# Patient Record
Sex: Male | Born: 1935 | Race: White | Hispanic: No | Marital: Married | Smoking: Current every day smoker
Health system: Southern US, Community
[De-identification: ages and names within clinical notes are randomized; demographics above are authoritative.]

## PROBLEM LIST (undated history)

## (undated) DIAGNOSIS — Z9861 Coronary angioplasty status: Principal | ICD-10-CM

## (undated) DIAGNOSIS — I10 Essential (primary) hypertension: Secondary | ICD-10-CM

## (undated) DIAGNOSIS — E785 Hyperlipidemia, unspecified: Secondary | ICD-10-CM

## (undated) DIAGNOSIS — I2510 Atherosclerotic heart disease of native coronary artery without angina pectoris: Secondary | ICD-10-CM

## (undated) DIAGNOSIS — I214 Non-ST elevation (NSTEMI) myocardial infarction: Secondary | ICD-10-CM

## (undated) DIAGNOSIS — E119 Type 2 diabetes mellitus without complications: Secondary | ICD-10-CM

## (undated) DIAGNOSIS — J189 Pneumonia, unspecified organism: Secondary | ICD-10-CM

## (undated) DIAGNOSIS — N400 Enlarged prostate without lower urinary tract symptoms: Secondary | ICD-10-CM

## (undated) DIAGNOSIS — M1990 Unspecified osteoarthritis, unspecified site: Secondary | ICD-10-CM

## (undated) HISTORY — DX: Non-ST elevation (NSTEMI) myocardial infarction: I21.4

## (undated) HISTORY — DX: Essential (primary) hypertension: I10

## (undated) HISTORY — DX: Type 2 diabetes mellitus without complications: E11.9

## (undated) HISTORY — PX: DENTAL SURGERY: SHX609

## (undated) HISTORY — DX: Coronary angioplasty status: Z98.61

## (undated) HISTORY — DX: Atherosclerotic heart disease of native coronary artery without angina pectoris: I25.10

## (undated) HISTORY — PX: CATARACT EXTRACTION W/ INTRAOCULAR LENS  IMPLANT, BILATERAL: SHX1307

## (undated) HISTORY — DX: Hyperlipidemia, unspecified: E78.5

## (undated) HISTORY — PX: VASECTOMY: SHX75

## (undated) HISTORY — PX: NASAL FRACTURE SURGERY: SHX718

## (undated) HISTORY — DX: Benign prostatic hyperplasia without lower urinary tract symptoms: N40.0

---

## 1998-09-27 ENCOUNTER — Encounter: Payer: Self-pay | Admitting: Internal Medicine

## 1998-09-27 ENCOUNTER — Ambulatory Visit (HOSPITAL_COMMUNITY): Admission: RE | Admit: 1998-09-27 | Discharge: 1998-09-27 | Payer: Self-pay | Admitting: Internal Medicine

## 1999-10-29 ENCOUNTER — Ambulatory Visit (HOSPITAL_COMMUNITY): Admission: RE | Admit: 1999-10-29 | Discharge: 1999-10-29 | Payer: Self-pay | Admitting: Urology

## 2001-01-29 ENCOUNTER — Emergency Department (HOSPITAL_COMMUNITY): Admission: EM | Admit: 2001-01-29 | Discharge: 2001-01-30 | Payer: Self-pay | Admitting: Emergency Medicine

## 2001-11-10 ENCOUNTER — Ambulatory Visit (HOSPITAL_COMMUNITY): Admission: RE | Admit: 2001-11-10 | Discharge: 2001-11-10 | Payer: Self-pay | Admitting: Internal Medicine

## 2001-11-10 ENCOUNTER — Encounter: Payer: Self-pay | Admitting: Internal Medicine

## 2003-02-14 ENCOUNTER — Ambulatory Visit (HOSPITAL_COMMUNITY): Admission: RE | Admit: 2003-02-14 | Discharge: 2003-02-14 | Payer: Self-pay | Admitting: Cardiology

## 2003-02-14 ENCOUNTER — Encounter: Payer: Self-pay | Admitting: Cardiology

## 2003-03-28 ENCOUNTER — Ambulatory Visit (HOSPITAL_COMMUNITY): Admission: RE | Admit: 2003-03-28 | Discharge: 2003-03-28 | Payer: Self-pay | Admitting: Gastroenterology

## 2003-03-28 ENCOUNTER — Encounter (INDEPENDENT_AMBULATORY_CARE_PROVIDER_SITE_OTHER): Payer: Self-pay | Admitting: Specialist

## 2004-01-09 ENCOUNTER — Ambulatory Visit (HOSPITAL_COMMUNITY): Admission: RE | Admit: 2004-01-09 | Discharge: 2004-01-09 | Payer: Self-pay | Admitting: Internal Medicine

## 2006-01-15 ENCOUNTER — Ambulatory Visit (HOSPITAL_COMMUNITY): Admission: RE | Admit: 2006-01-15 | Discharge: 2006-01-15 | Payer: Self-pay | Admitting: Internal Medicine

## 2007-05-12 ENCOUNTER — Ambulatory Visit (HOSPITAL_COMMUNITY): Admission: RE | Admit: 2007-05-12 | Discharge: 2007-05-12 | Payer: Self-pay | Admitting: Gastroenterology

## 2008-07-26 ENCOUNTER — Ambulatory Visit (HOSPITAL_COMMUNITY): Admission: RE | Admit: 2008-07-26 | Discharge: 2008-07-26 | Payer: Self-pay | Admitting: Internal Medicine

## 2009-05-03 ENCOUNTER — Ambulatory Visit (HOSPITAL_COMMUNITY): Admission: RE | Admit: 2009-05-03 | Discharge: 2009-05-03 | Payer: Self-pay | Admitting: Internal Medicine

## 2009-07-11 ENCOUNTER — Encounter: Admission: RE | Admit: 2009-07-11 | Discharge: 2009-07-11 | Payer: Self-pay | Admitting: Orthopaedic Surgery

## 2009-12-19 DIAGNOSIS — I214 Non-ST elevation (NSTEMI) myocardial infarction: Secondary | ICD-10-CM

## 2009-12-19 HISTORY — DX: Non-ST elevation (NSTEMI) myocardial infarction: I21.4

## 2009-12-19 HISTORY — PX: CORONARY ANGIOPLASTY: SHX604

## 2009-12-19 HISTORY — PX: CARDIAC CATHETERIZATION: SHX172

## 2009-12-19 HISTORY — PX: TRANSTHORACIC ECHOCARDIOGRAM: SHX275

## 2009-12-30 ENCOUNTER — Inpatient Hospital Stay (HOSPITAL_COMMUNITY): Admission: EM | Admit: 2009-12-30 | Discharge: 2010-01-02 | Payer: Self-pay | Admitting: Emergency Medicine

## 2009-12-31 DIAGNOSIS — I2510 Atherosclerotic heart disease of native coronary artery without angina pectoris: Secondary | ICD-10-CM | POA: Insufficient documentation

## 2010-01-01 ENCOUNTER — Encounter (INDEPENDENT_AMBULATORY_CARE_PROVIDER_SITE_OTHER): Payer: Self-pay | Admitting: Internal Medicine

## 2010-05-15 ENCOUNTER — Encounter: Admission: RE | Admit: 2010-05-15 | Discharge: 2010-05-15 | Payer: Self-pay | Admitting: Internal Medicine

## 2010-08-21 HISTORY — PX: CARDIAC CATHETERIZATION: SHX172

## 2010-09-10 ENCOUNTER — Observation Stay (HOSPITAL_COMMUNITY)
Admission: EM | Admit: 2010-09-10 | Discharge: 2010-09-11 | DRG: 287 | Disposition: A | Discharge status: Home / Self Care | Payer: Medicare Other | Attending: Cardiology | Admitting: Cardiology

## 2010-09-10 ENCOUNTER — Emergency Department (HOSPITAL_COMMUNITY): Payer: Medicare Other

## 2010-09-10 DIAGNOSIS — F17200 Nicotine dependence, unspecified, uncomplicated: Secondary | ICD-10-CM | POA: Diagnosis present

## 2010-09-10 DIAGNOSIS — Z7902 Long term (current) use of antithrombotics/antiplatelets: Secondary | ICD-10-CM

## 2010-09-10 DIAGNOSIS — Z7982 Long term (current) use of aspirin: Secondary | ICD-10-CM

## 2010-09-10 DIAGNOSIS — E119 Type 2 diabetes mellitus without complications: Secondary | ICD-10-CM | POA: Diagnosis present

## 2010-09-10 DIAGNOSIS — I2510 Atherosclerotic heart disease of native coronary artery without angina pectoris: Principal | ICD-10-CM | POA: Diagnosis present

## 2010-09-10 DIAGNOSIS — Z9861 Coronary angioplasty status: Secondary | ICD-10-CM

## 2010-09-10 DIAGNOSIS — E785 Hyperlipidemia, unspecified: Secondary | ICD-10-CM | POA: Diagnosis present

## 2010-09-10 DIAGNOSIS — N400 Enlarged prostate without lower urinary tract symptoms: Secondary | ICD-10-CM | POA: Diagnosis present

## 2010-09-10 DIAGNOSIS — I200 Unstable angina: Secondary | ICD-10-CM | POA: Diagnosis present

## 2010-09-10 DIAGNOSIS — M1990 Unspecified osteoarthritis, unspecified site: Secondary | ICD-10-CM | POA: Diagnosis present

## 2010-09-10 DIAGNOSIS — I252 Old myocardial infarction: Secondary | ICD-10-CM

## 2010-09-10 LAB — CBC
HCT: 45.5 % (ref 39.0–52.0)
Hemoglobin: 15.6 g/dL (ref 13.0–17.0)
MCH: 32.4 pg (ref 26.0–34.0)
MCHC: 34.3 g/dL (ref 30.0–36.0)
RBC: 4.81 MIL/uL (ref 4.22–5.81)
WBC: 11.2 10*3/uL — ABNORMAL HIGH (ref 4.0–10.5)

## 2010-09-10 LAB — DIFFERENTIAL
Eosinophils Absolute: 0.4 10*3/uL (ref 0.0–0.7)
Lymphs Abs: 1.8 10*3/uL (ref 0.7–4.0)
Monocytes Absolute: 1.5 10*3/uL — ABNORMAL HIGH (ref 0.1–1.0)
Neutrophils Relative %: 67 % (ref 43–77)

## 2010-09-10 LAB — GLUCOSE, CAPILLARY: Glucose-Capillary: 195 mg/dL — ABNORMAL HIGH (ref 70–99)

## 2010-09-10 LAB — COMPREHENSIVE METABOLIC PANEL
ALT: 24 U/L (ref 0–53)
CO2: 26 mEq/L (ref 19–32)
Creatinine, Ser: 0.98 mg/dL (ref 0.4–1.5)
GFR calc Af Amer: >60 mL/min (ref >60)
Glucose, Bld: 117 mg/dL — ABNORMAL HIGH (ref 70–99)
Potassium: 3.9 mEq/L (ref 3.5–5.1)
Total Bilirubin: 0.4 mg/dL (ref 0.3–1.2)
Total Protein: 6.7 g/dL (ref 6.0–8.3)

## 2010-09-10 LAB — TROPONIN I: Troponin I: 0.04 ng/mL (ref 0.00–0.06)

## 2010-09-10 LAB — CK TOTAL AND CKMB (NOT AT ARMC)
CK, MB: 2.4 ng/mL (ref 0.3–4.0)
Relative Index: 1.6 (ref 0.0–2.5)

## 2010-09-10 LAB — POCT CARDIAC MARKERS: Myoglobin, poc: 59.2 ng/mL (ref 12–200)

## 2010-09-11 LAB — CBC
HCT: 39.8 % (ref 39.0–52.0)
Hemoglobin: 13.2 g/dL (ref 13.0–17.0)
MCHC: 33.2 g/dL (ref 30.0–36.0)
MCV: 94.3 fL (ref 78.0–100.0)
Platelets: 182 10*3/uL (ref 150–400)
RBC: 4.22 MIL/uL (ref 4.22–5.81)

## 2010-09-11 LAB — CARDIAC PANEL(CRET KIN+CKTOT+MB+TROPI)
Relative Index: INVALID (ref 0.0–2.5)
Troponin I: 0.03 ng/mL (ref 0.00–0.06)

## 2010-09-11 LAB — HEPARIN LEVEL (UNFRACTIONATED): Heparin Unfractionated: 0.11 IU/mL — ABNORMAL LOW (ref 0.30–0.70)

## 2010-09-11 LAB — GLUCOSE, CAPILLARY: Glucose-Capillary: 119 mg/dL — ABNORMAL HIGH (ref 70–99)

## 2010-09-12 LAB — POCT ACTIVATED CLOTTING TIME: Activated Clotting Time: 452 seconds

## 2010-09-17 NOTE — Procedures (Signed)
NAME:  David [name], David [name] NO.:  [id number]  MEDICAL RECORD NO.:  [id number]           PATIENT TYPE:  I  LOCATION:  6525                         FACILITY:  [facility]  PHYSICIAN:  Landry Corporal, MD DATE OF BIRTH:  [birth year]  DATE OF PROCEDURE: DATE OF DISCHARGE:  09/11/2010                           CARDIAC CATHETERIZATION   PRIMARY CARDIOLOGIST:  Gaspar Garbe B. Little, MD  PRIMARY PHYSICIAN:  Lucky Cowboy, MD.  PERFORMING PHYSICIAN:  Landry Corporal, MD  PROCEDURES PERFORMED: 1. Left heart catheterization via first the 5-French upsized to 6-     French sheath in the right femoral artery. 2. Selective coronary angiography. 3. Left ventriculography in the RAO projection with 10 mL of contrast     per second for 30 seconds. 4. Fractional flow reserve measurement of the proximal and mid LAD.  INDICATIONS: 1. Chest pain concerning for unstable angina. 2. Known coronary artery disease, status post PCI to the LAD in June     2011.  BRIEF HISTORY:  David [name] is a pleasant 75 year old gentleman, followed by Dr. Clarene Duke and Dr. Oneta Rack with a history of known coronary artery disease with non-ST-elevation MI in June 2011, where he underwent PTCA of the proximal LAD and drug-eluting stenting of the distal LAD with a Promus 2.5 x 15-mm stent.  His ejection fraction was about 45% at catheterization at that time.  He had medication adjustments in November, but then presented on February 21 with onset of waking up with chest pain at 2 o'clock in the morning.  It was left sided, throbbing. It was similar to his prior MI, but just not as severe.  Based on his history and risk factors of hypertension, hyperlipidemia, and known coronary artery disease, the decision was made to take the patient to cardiac catheterization lab for diagnostic catheterization plus/minus intervention.  The risks, benefits, alternatives, and indications of the procedure were explained to the  patient in detail.  The patient voiced understanding and informed consent was obtained with the signed form placed on the chart.  DESCRIPTION OF PROCEDURE:  The patient was brought to second floor [facility] in a fasting state.  After a modified Allen test was performed in the right radial artery that demonstrated abnormal flow, the patient was then prepped and draped in the usual sterile fashion with the right groin prepped.  A time-out period was performed and the patient was sedated with intravenous Versed and fentanyl.  The femoral head was localized using tactile and fluoroscopic guidance.  Then, the right groin was anesthetized using 1% subcutaneous lidocaine.  The right common femoral artery was then accessed using the modified Seldinger technique with a placement of 5-French sheath. First, a 5-French JL-4 followed by 5-French JR-4 catheter were advanced over wire.  Multiple angiographic views of first left then right coronary systems were obtained.  The JR-4 catheter was exchanged over wire for a 5-French pigtail catheter, which was advanced across the aortic valve measuring left ventricular pressure and pullback gradient. With the catheter still in place in the ventricle in the RAO projection, a left ventriculogram was performed with 10 mL  of contrast per second for 30 seconds.  After completion of left ventriculogram, the catheter was pulled back across the aortic valve for measuring pullback gradient.  PRELIMINARY ANGIOGRAPHIC FINDINGS: 1. Left main is a large-caliber vessel, bifurcates in circumflex     arteries.  There was no significant coronary artery disease, just     minimal luminal irregularities. 2. LAD starts off as a relative large-caliber vessel, and then after     the takeoff of the first diagonal branch, it does have a stepdown     to about 50% lesion, and then stays along tubular 40-50% lesion,     terminating in about 60-70% lesion  at the takeoff of the next     second small diagonal branch.  Just following that branch, there is     a widely patent Promus drug-eluting stent and the vessel remained     in this caliber of the stent throughout the remainder of the     vessel.  The diagonal branch itself had significant arteries, but     had no significant disease. 3. Left circumflex artery is a large-caliber vessel, which bifurcates     into a large branching obtuse marginal with no significant disease     in the atrioventricular groove.  Circumflex itself also goes down     to provide small posterolateral branches, but there is no     significant disease in these vessels. 4. The RCA is a large-caliber dominant vessel that did have a small to     moderate-caliber right ventricular marginal branch that has a 90%     lesion in the ostial portion.  There is a large sleeping RV     marginal branch that goes down around to the apex.  There is no     significant disease.  The remainder of the RCA continues on to give     rise to a posterior descending artery and a very small     posterolateral branch system.  There is no significant disease in     these vessels. 5. The left ventriculogram showed ejection fraction of 50-55% with no     significant wall motion abnormalities.  There was no pullback     gradient across the aortic valve.  HEMODYNAMIC RESULTS:  Aortic pressures 102/54 mmHg with a mean of 72 mmHg.  Left ventricular pressure is 99/8 mmHg with an EDP of 50 mmHg.  FRACTIONAL FLOW RESERVE PROCEDURE:  After angiographic views of the LAD were performed, the intermediate lesions were concerning to be the potential culprit lesions as this was area that had been intervened on before.  The 5-French sheath was exchanged over wire for a 6-French sheath, and a 6-French XBLAD 3.5 catheter was advanced over wire and used to engage the left coronary artery.  Prior to advancing the sheath, an Angiomax bolus and drip was  initiated with confirmation of an ACT greater than 200 seconds.  Volcano FFR wire was then zeroed and then advanced to the tip of the catheter for normalization.  The wire was then advanced down across the lesion beyond the stent in the LAD.  It was confirmed fluoroscopically, and intravenous adenosine was infused at the standard rate for 2 minutes.  The baseline FFR prior to adenosine infusion was 0.93.  After 2 minutes of infusion, the average was 0.81- 0.82.  This was not felt to be angiographically significant.  The wire was then removed completely out of the body and SCOUT angiography confirmed  no perforation or dissection in the LAD with TIMI 3 flow. Catheter was then removed completely over wire without any complications, and a sheath was sewn in place to be pulled back in the patient's room after the standard 2-hour wait for Angiomax.  The Angiomax drip was discontinued at this time.  The patient was stable before, during, and after the procedure.  CATHETERIZATION STATISTICS: 1. Sedation.  IV Versed 1 mg and IV fentanyl 25 mcg. 2. The patient was given 250 mL bolus of normal saline. 3. Angiomax bolus and drip was administered according to weight-based     protocol. 4. Intravenous adenosine was administered for 2 minutes.  IMPRESSION: 1. Culprit lesion was likely the 90% ostial lesion in the right     ventricular marginal branch.  This was not a lesion or vessel that     is usually amenable to percutaneous intervention, and therefore it     is best for medical management. 2. Diffuse moderate, but nonobstructive coronary artery disease and     wide patent stent in the left anterior descending.  The     conglomerate fractional flow reserve in the proximal and mid     portion of the LAD is 0.81, which is not hemodynamically     significant. 3. Low normal ejection fraction of 50-55% with relatively normal left     ventricular end-diastolic pressure.  PLAN: 1. The patient will  return to his room on 6500 for standard     postcatheterization care and sheath pull.  He after standard     bedrest if stable, will likely be discharged today. 2. We will plan on optimizing his medical management.  He is somewhat     bradycardic and therefore it would be unlikely to go up on his beta-     blocker, and would likely initiate a long-acting nitroglycerin     versus amlodipine depending how is blood pressure is upon arrival     to the floor. 3. Smoking cessation counseling as the patient continues to smoke     cigar. 4. He continued to say he is intolerant of statins and would not take     statin, so for his dyslipidemia, we will have to do diet and     exercise counseling and consider fibrate.  I discussed the case with the patient and with Dr. Clarene Duke who is in agreement with the plan.          ______________________________ Landry Corporal, MD     DWH/MEDQ  D:  09/11/2010  T:  09/12/2010  Job:  [id number]  cc:   Thereasa Solo. Little, M.D. [facility] Lucky Cowboy, M.D.  Electronically Signed by Bryan Lemma MD on 09/17/2010 09:48:22 AM

## 2010-09-19 NOTE — Discharge Summary (Signed)
NAME:  David [name], David [name] NO.:  [id number]  MEDICAL RECORD NO.:  [id number]           PATIENT TYPE:  I  LOCATION:  6525                         FACILITY:  [facility]  PHYSICIAN:  Thereasa Solo. Little, M.D. DATE OF BIRTH:  Oct 19, 1935  DATE OF ADMISSION:  09/10/2010 DATE OF DISCHARGE:                              DISCHARGE SUMMARY   DISCHARGE DIAGNOSES: 1. Chest pain consistent with unstable angina. 2. Catheterization this admission showing moderate coronary disease to     be treated medically with no in-stent restenosis of the previously     placed left anterior descending stent, no restenosis of previous     left anterior descending angioplasty site. 3. Known coronary disease with non-ST-elevation myocardial infarction,     treated with left anterior descending, proximal percutaneous     transluminal coronary angioplasty, and distal left anterior     descending drug-eluting stent placement, June 2011. 4. Type 2 non-insulin-dependent diabetes. 5. Dyslipidemia with statin intolerance. 6. History of benign prostatic hypertrophy, the patient was intolerant     to Flomax and terazosin. 7. Degenerative joint disease.  HOSPITAL COURSE:  The patient is a pleasant 75 year old male followed by Dr. Clarene Duke and Dr. Oneta Rack.  He has coronary disease, he had a non-ST- elevation MI in June 2011.  He underwent catheterization in the proximal LAD and had an angioplasty in the distal LAD and drug-eluting stent was placed.  He saw Dr. Clarene Duke in November and Dr. Clarene Duke made some adjustments in his medications.  He has had problems with dizziness and we felt some of this was orthostatic in nature.  The patient presented to the emergency room on September 10, 2010 complaining of chest pain which is left-sided and described as "throbbing."  Although somewhat atypical, the patient says this was similar to his pre-MI symptoms.  The patient was seen by Dr. Tresa Endo and myself and admitted to  telemetry and started on heparin and set up for diagnostic catheterization.  This was done by Dr. Herbie Baltimore on September 11, 2010.  The proximal LAD angioplasty site was 50-60% narrowed, the Promus mid LAD stent was patent, although there was a 60-70% stenosis just proximal to the stent.  Left main circumflex and OM were patent.  RCA was patent but he did have a 99% RV branch.  Plan is for medical therapy.  We added nitrate.  We tried increasing his beta-blocker and ACE inhibitor on admission but his blood pressure and heart rate would not tolerate this, so we cut back to his home dose at discharge.  The patient also says that he is not taking his Flomax because it made him dizzy, he is also not taking terazosin because he had some type of side effect from that.  He will follow up with Dr. Clarene Duke in a couple of weeks.  He has been encouraged to quit smoking cigars.  LABORATORY DATA:  White count 8.8, hemoglobin 13.2, hematocrit 39.8, and platelets 182.  CK-MB and troponins were negative.  Sodium 137, potassium 3.9, BUN 13, and creatinine 0.98.  LFTs were normal.  INR 0.89.  EKG shows sinus rhythm and nonspecific ST  changes.  Chest x-ray shows no acute findings.  DISPOSITION:  The patient is discharged late on September 11, 2010.  He will follow up with Dr. Clarene Duke as an outpatient in a week or two.     Abelino Derrick, P.A.   ______________________________ Thereasa Solo. Little, M.D.    Lenard Lance  D:  09/11/2010  T:  09/12/2010  Job:  [id number]  cc:   Lucky Cowboy, M.D.  Electronically Signed by Corine Shelter P.A. on 09/13/2010 09:10:03 AM Electronically Signed by Julieanne Manson M.D. on 09/19/2010 07:58:43 AM

## 2010-09-23 NOTE — H&P (Signed)
NAME:  David [name], David [name] NO.:  [id number]  MEDICAL RECORD NO.:  [id number]           PATIENT TYPE:  I  LOCATION:  6525                         FACILITY:  [facility]  PHYSICIAN:  Nicki Guadalajara, M.D.     DATE OF BIRTH:  [birth year]  DATE OF ADMISSION:  09/10/2010 DATE OF DISCHARGE:                             HISTORY & PHYSICAL   CHIEF COMPLAINT:  Chest pain.  HISTORY OF PRESENT ILLNESS:  Mr. David [name] is a 75 year old male, followed by Dr. Clarene Duke and Dr. Oneta Rack with a history of coronary disease.  He had a non-ST elevation MI in June 2011.  His troponin peaked then at 8.26.  He underwent catheterization and angioplasty to the proximal LAD and stenting to the distal LAD.  His EF was 45% at cath.  He has done pretty well since, Dr. Clarene Duke saw him in November and made some adjustments in his medications.  The patient presented to the emergency room on September 10, 2010, complaining of chest pain which actually started about 2:00 a.m.  He describes as his pain is left-sided throbbing chest pain off and on during the day.  There is no obvious aggravating or alleviating factors, although sometimes he thinks it is worse when he lays on his left side.  He says this pain is similar to his pre MI pain, although not as intense.  He denies any associated nausea, vomiting, diaphoresis or radiation of his arms or jaw.  He did not take nitroglycerin, he says he does not have this at home.  He is admitted by Dr. Tresa Endo and myself for further evaluation.  PAST MEDICAL HISTORY:  Remarkable for type 2 non-insulin-dependent diabetes.  He has symptoms of BPH, and Dr. Clarene Duke recently added Flomax. He has dyslipidemia, but is intolerant to LIPITOR and CRESTOR and says that he will not take a statin ever again.  He has had a remote polypectomy in 2004.  He does have a cyst in his back that causes some pain in his leg, he will probably need surgery for this once he can come off Plavix safely.   He did receive a drug-eluting stent in June.  He also has arthritis and DJD in his knees.  HOME MEDICATIONS: 1. Vitamin D. 2. Terazosin 5 mg a day. 3. Aspirin 81 mg a day. 4. Lisinopril 5 mg a day. 5. Metformin 500 mg b.i.d. 6. Metoprolol 25 mg half tablet twice a day. 7. Plavix 75 mg a day.  ALLERGIES:  He is allergic to TETANUS TOXOID, he is intolerant to STATINS.  SOCIAL HISTORY:  He is retired from [employer].  He smokes cigars.  He has 1 or 2 glasses of vodka on a daily basis.  FAMILY HISTORY:  Unremarkable for early coronary disease.  REVIEW OF SYSTEMS:  Essentially unremarkable except for noted above.  PHYSICAL EXAMINATION:  VITAL SIGNS:  Blood pressure is 124/90 in the emergency room with a pulse of 62, respirations 16. GENERAL:  He is a well-developed, well-nourished male in no acute distress. HEENT:  Normocephalic, atraumatic.  Extraocular movements are intact. Sclerae are nonicteric.  Lids and conjunctivae within normal limits.  NECK:  Without JVD or bruit. CHEST:  Clear to auscultation and percussion. CARDIAC:  Regular rate and rhythm without obvious murmur, rub, or gallop.  Normal S1 and S2. ABDOMEN:  Nontender.  No hepatosplenomegaly. EXTREMITIES:  Without edema.  Distal pulses intact. NEURO:  Grossly intact.  He is awake, alert, oriented, and cooperative. Moves all extremities without obvious deficit. SKIN:  Cool and dry.  Initial troponin is negative.  EKG shows sinus rhythm without acute changes.  IMPRESSION: 1. Unstable angina. 2. Known coronary disease with non-ST elevation myocardial infarction     treated with a proximal left anterior descending, percutaneous     transluminal coronary angioplasty and distal left anterior     descending, drug-eluting stent in June 2011. 3. Mild left ventricular dysfunction with an ejection fraction of 45%     at cath. 4. Type 2 non-insulin-dependent diabetes. 5. Dyslipidemia with statin intolerance. 6.  History of benign prostatic hypertrophy. 7. History of polypectomy in 2004. 8. Degenerative joint disease.  PLAN:  The patient was seen by Dr. Tresa Endo and myself in the emergency room.  We will go ahead and admit him as unstable angina.  We started nitroglycerin paste and we will start heparin.  He will need diagnostic catheterization.     Abelino Derrick, P.A.   ______________________________ Nicki Guadalajara, M.D.    Lenard Lance  D:  09/11/2010  T:  09/11/2010  Job:  [id number]  Electronically Signed by Corine Shelter P.A. on 09/11/2010 12:26:01 PM Electronically Signed by Nicki Guadalajara M.D. on 09/23/2010 05:17:15 PM

## 2010-10-06 LAB — GLUCOSE, CAPILLARY

## 2010-10-06 LAB — BASIC METABOLIC PANEL
BUN: 14 mg/dL (ref 6–23)
Calcium: 9.1 mg/dL (ref 8.4–10.5)
Creatinine, Ser: 1.06 mg/dL (ref 0.4–1.5)
GFR calc Af Amer: >60 mL/min (ref >60)
GFR calc non Af Amer: >60 mL/min (ref >60)
Potassium: 4.3 mEq/L (ref 3.5–5.1)

## 2010-10-06 LAB — CBC
HCT: 43.1 % (ref 39.0–52.0)
MCV: 95 fL (ref 78.0–100.0)
RBC: 4.53 MIL/uL (ref 4.22–5.81)
WBC: 10.5 10*3/uL (ref 4.0–10.5)

## 2010-10-07 LAB — CBC
HCT: 39.9 % (ref 39.0–52.0)
HCT: 42.5 % (ref 39.0–52.0)
HCT: 42.5 % (ref 39.0–52.0)
Hemoglobin: 14.3 g/dL (ref 13.0–17.0)
Hemoglobin: 14.6 g/dL (ref 13.0–17.0)
MCHC: 33.7 g/dL (ref 30.0–36.0)
MCV: 94.3 fL (ref 78.0–100.0)
MCV: 94.5 fL (ref 78.0–100.0)
Platelets: 160 10*3/uL (ref 150–400)
RBC: 4.24 MIL/uL (ref 4.22–5.81)
RBC: 4.5 MIL/uL (ref 4.22–5.81)
RBC: 4.51 MIL/uL (ref 4.22–5.81)
RDW: 13.1 % (ref 11.5–15.5)
WBC: 10.6 10*3/uL — ABNORMAL HIGH (ref 4.0–10.5)
WBC: 9.8 10*3/uL (ref 4.0–10.5)

## 2010-10-07 LAB — GLUCOSE, CAPILLARY
Glucose-Capillary: 116 mg/dL — ABNORMAL HIGH (ref 70–99)
Glucose-Capillary: 118 mg/dL — ABNORMAL HIGH (ref 70–99)
Glucose-Capillary: 119 mg/dL — ABNORMAL HIGH (ref 70–99)
Glucose-Capillary: 131 mg/dL — ABNORMAL HIGH (ref 70–99)
Glucose-Capillary: 133 mg/dL — ABNORMAL HIGH (ref 70–99)
Glucose-Capillary: 138 mg/dL — ABNORMAL HIGH (ref 70–99)

## 2010-10-07 LAB — CK TOTAL AND CKMB (NOT AT ARMC)
CK, MB: 4.6 ng/mL — ABNORMAL HIGH (ref 0.3–4.0)
Total CK: 104 U/L (ref 7–232)

## 2010-10-07 LAB — LIPID PANEL
Cholesterol: 140 mg/dL (ref 0–200)
HDL: 41 mg/dL (ref >39)
Total CHOL/HDL Ratio: 3.4 RATIO
VLDL: 44 mg/dL — ABNORMAL HIGH (ref 0–40)

## 2010-10-07 LAB — COMPREHENSIVE METABOLIC PANEL
AST: 33 U/L (ref 0–37)
Albumin: 3.4 g/dL — ABNORMAL LOW (ref 3.5–5.2)
Alkaline Phosphatase: 51 U/L (ref 39–117)
BUN: 19 mg/dL (ref 6–23)
CO2: 29 mEq/L (ref 19–32)
Chloride: 107 mEq/L (ref 96–112)
GFR calc non Af Amer: >60 mL/min (ref >60)
Potassium: 3.9 mEq/L (ref 3.5–5.1)
Total Bilirubin: 0.7 mg/dL (ref 0.3–1.2)

## 2010-10-07 LAB — POCT I-STAT, CHEM 8
BUN: 22 mg/dL (ref 6–23)
Creatinine, Ser: 1.3 mg/dL (ref 0.4–1.5)
Glucose, Bld: 132 mg/dL — ABNORMAL HIGH (ref 70–99)
Hemoglobin: 14.6 g/dL (ref 13.0–17.0)
Potassium: 3.7 mEq/L (ref 3.5–5.1)
Sodium: 142 mEq/L (ref 135–145)

## 2010-10-07 LAB — PROTIME-INR: Prothrombin Time: 13.1 seconds (ref 11.6–15.2)

## 2010-10-07 LAB — BASIC METABOLIC PANEL
CO2: 29 mEq/L (ref 19–32)
Chloride: 106 mEq/L (ref 96–112)
GFR calc Af Amer: >60 mL/min (ref >60)
Glucose, Bld: 105 mg/dL — ABNORMAL HIGH (ref 70–99)
Potassium: 3.8 mEq/L (ref 3.5–5.1)
Sodium: 140 mEq/L (ref 135–145)

## 2010-10-07 LAB — POCT CARDIAC MARKERS
CKMB, poc: <1 ng/mL — ABNORMAL LOW (ref 1.0–8.0)
Myoglobin, poc: 65.7 ng/mL (ref 12–200)
Troponin i, poc: <0.05 ng/mL (ref 0.00–0.09)

## 2010-10-07 LAB — DIFFERENTIAL
Basophils Absolute: 0 10*3/uL (ref 0.0–0.1)
Basophils Relative: 0 % (ref 0–1)
Monocytes Relative: 10 % (ref 3–12)
Neutro Abs: 6.6 10*3/uL (ref 1.7–7.7)
Neutrophils Relative %: 68 % (ref 43–77)

## 2010-10-07 LAB — LIPASE, BLOOD: Lipase: 23 U/L (ref 11–59)

## 2010-10-07 LAB — HEPATIC FUNCTION PANEL
ALT: 22 U/L (ref 0–53)
Alkaline Phosphatase: 59 U/L (ref 39–117)
Bilirubin, Direct: <0.1 mg/dL (ref 0.0–0.3)
Total Bilirubin: 0.4 mg/dL (ref 0.3–1.2)

## 2010-10-07 LAB — MRSA PCR SCREENING: MRSA by PCR: NEGATIVE

## 2010-10-07 LAB — CARDIAC PANEL(CRET KIN+CKTOT+MB+TROPI)
CK, MB: 71.6 ng/mL (ref 0.3–4.0)
Relative Index: 16.4 — ABNORMAL HIGH (ref 0.0–2.5)
Total CK: 154 U/L (ref 7–232)
Total CK: 446 U/L — ABNORMAL HIGH (ref 7–232)
Troponin I: 2.86 ng/mL (ref 0.00–0.06)

## 2010-10-07 LAB — BRAIN NATRIURETIC PEPTIDE: Pro B Natriuretic peptide (BNP): 48 pg/mL (ref 0.0–100.0)

## 2010-12-03 NOTE — Op Note (Signed)
NAME:  David [name], ERICKSEN NO.:  [id number]   MEDICAL RECORD NO.:  [id number]          PATIENT TYPE:  AMB   LOCATION:  ENDO                         FACILITY:  [facility]   PHYSICIAN:  James L. Malon Kindle., M.D.DATE OF BIRTH:  [birth year]   DATE OF PROCEDURE:  05/12/2007  DATE OF DISCHARGE:                               OPERATIVE REPORT   PROCEDURE:  Colonoscopy.   ENDOSCOPIST:  Llana Aliment. Edwards, M.D.   MEDICATIONS:  Fentanyl 75 mcg, Versed 7 mg IV.   INDICATION:  The patient had colonoscopy in September with findings of a  1/5-cm flat sessile polyp; it was shaved down in pieces.  The path  revealed this to be benign.  It was not clear that all this was  adequately removed and it was felt that another colonoscopy after a  period of healing with an argon plasma coagulator on standby was  indicated.   DESCRIPTION OF PROCEDURE:  Procedure had been explained to the patient  and consent obtained.  We discussed in detail with him the possibility  of using the argon plasma coagulator to cauterize the base of the polyp  if any residual polyp was left.  The Fujinon pediatric scope was used.  We performed a digital rectal exam and inserted the scope and advanced  easily to the cecum; ileocecal valve and appendiceal orifice were seen.  The scope was withdrawn and the cecum and ascending colon were seen very  well.  Multiple passes were made in the area of the previous polyp.  I  was unable to find any residual polyp.  We turned the patient on his  back and made a couple more passes on his back and still were able to  find any polyp.  The scope was withdrawn.  No other polyps were seen  throughout the remainder of the colon.  The scope was withdrawn.  The  patient tolerated the procedure well.   ASSESSMENT:  Recent large polyp with no residual polyp discovered.   PLAN:  We will recommend repeating procedure in 2 years.           ______________________________  Llana Aliment  Malon Kindle., M.D.     Waldron Session  D:  05/12/2007  T:  05/13/2007  Job:  [id number]   cc:   Lucky Cowboy, M.D.  Fax: [phone]

## 2010-12-06 NOTE — Op Note (Signed)
   NAME:  David [name], David [name]                        ACCOUNT NO.:  [id number]   MEDICAL RECORD NO.:  [id number]                   PATIENT TYPE:  AMB   LOCATION:  ENDO                                 [facility]ACILITY:  [facility]   PHYSICIAN:  James L. Malon Kindle., M.D.          DATE OF BIRTH:  April 18, 1936   DATE OF PROCEDURE:  03/28/2003  DATE OF DISCHARGE:                                 OPERATIVE REPORT   PROCEDURE PERFORMED:  Colonoscopy with polypectomy, fulguration with the  argon plasma coagulator.   ENDOSCOPIST:  Llana Aliment. Edwards, M.D.   MEDICATIONS:  Fentanyl 87.5 mcg, Versed 7 mg IV.   INSTRUMENT USED:  Pediatric adjustable colonoscope.   INDICATIONS FOR PROCEDURE:  Colon cancer screening.   DESCRIPTION OF PROCEDURE:  The procedure had been explained to the patient  and consent obtained.  With the patient in the left lateral decubitus  position, the  Olympus video colonoscope was inserted and advanced.  We were  able to advance easily to the cecum.  The ileocecal valve and appendiceal  orifice were seen.  The cecum appeared normal.  Just above the cecum on the  opposite wall of the ileocecal valve, was a 2 cm sessile polyp that was  draped around the fold.  Using the minisnare, I shaved this down in multiple  pieces.  I was not entirely certain that I got it all since there were some  areas that appeared to have some residual polyp.  We had the argon plasma  coagulator on standby and it was available, so I went ahead and used the  argon plasma coagulator and fulgurated the entire base of the polyp.  Multiple pieces were obtained.  I had a feeling that I had successfully  fulgurated the whole base of the polyp.  The scope was withdrawn and the  proximal ascending colon, transverse, descending and sigmoid colon were seen  well.  No polyps were seen.  The scope was withdrawn.  The patient tolerated  the procedure well.   ASSESSMENT:  Ascending colon polyp 2 cm snared and removed in pieces,  the  entire base fulgurated with argon plasma coagulator.   PLAN:  Routine postpolypectomy instructions, will recommend repeating in one  year and check a pathology report.                                               James L. Malon Kindle., M.D.    Waldron Session  D:  03/28/2003  T:  03/28/2003  Job:  [id number]   cc:   Lucky Cowboy, M.D.  [address]  [place], [place] [address]  Fax: [phone]

## 2011-08-19 DIAGNOSIS — E559 Vitamin D deficiency, unspecified: Secondary | ICD-10-CM | POA: Diagnosis not present

## 2011-08-19 DIAGNOSIS — E782 Mixed hyperlipidemia: Secondary | ICD-10-CM | POA: Diagnosis not present

## 2011-08-19 DIAGNOSIS — I10 Essential (primary) hypertension: Secondary | ICD-10-CM | POA: Diagnosis not present

## 2011-08-19 DIAGNOSIS — E119 Type 2 diabetes mellitus without complications: Secondary | ICD-10-CM | POA: Diagnosis not present

## 2011-08-19 DIAGNOSIS — Z79899 Other long term (current) drug therapy: Secondary | ICD-10-CM | POA: Diagnosis not present

## 2011-10-28 DIAGNOSIS — I2510 Atherosclerotic heart disease of native coronary artery without angina pectoris: Secondary | ICD-10-CM | POA: Diagnosis not present

## 2011-10-28 DIAGNOSIS — I10 Essential (primary) hypertension: Secondary | ICD-10-CM | POA: Diagnosis not present

## 2011-10-28 DIAGNOSIS — E119 Type 2 diabetes mellitus without complications: Secondary | ICD-10-CM | POA: Diagnosis not present

## 2011-10-28 DIAGNOSIS — E782 Mixed hyperlipidemia: Secondary | ICD-10-CM | POA: Diagnosis not present

## 2011-10-29 DIAGNOSIS — H40019 Open angle with borderline findings, low risk, unspecified eye: Secondary | ICD-10-CM | POA: Diagnosis not present

## 2011-10-29 DIAGNOSIS — H02839 Dermatochalasis of unspecified eye, unspecified eyelid: Secondary | ICD-10-CM | POA: Diagnosis not present

## 2011-10-29 DIAGNOSIS — E119 Type 2 diabetes mellitus without complications: Secondary | ICD-10-CM | POA: Diagnosis not present

## 2011-10-29 DIAGNOSIS — H2510 Age-related nuclear cataract, unspecified eye: Secondary | ICD-10-CM | POA: Diagnosis not present

## 2011-11-18 DIAGNOSIS — E782 Mixed hyperlipidemia: Secondary | ICD-10-CM | POA: Diagnosis not present

## 2011-11-18 DIAGNOSIS — E119 Type 2 diabetes mellitus without complications: Secondary | ICD-10-CM | POA: Diagnosis not present

## 2011-11-18 DIAGNOSIS — E559 Vitamin D deficiency, unspecified: Secondary | ICD-10-CM | POA: Diagnosis not present

## 2011-11-18 DIAGNOSIS — I10 Essential (primary) hypertension: Secondary | ICD-10-CM | POA: Diagnosis not present

## 2011-11-18 DIAGNOSIS — Z79899 Other long term (current) drug therapy: Secondary | ICD-10-CM | POA: Diagnosis not present

## 2012-02-06 ENCOUNTER — Other Ambulatory Visit: Payer: Self-pay | Admitting: Physician Assistant

## 2012-02-06 DIAGNOSIS — L259 Unspecified contact dermatitis, unspecified cause: Secondary | ICD-10-CM | POA: Diagnosis not present

## 2012-02-06 DIAGNOSIS — D235 Other benign neoplasm of skin of trunk: Secondary | ICD-10-CM | POA: Diagnosis not present

## 2012-02-06 DIAGNOSIS — D485 Neoplasm of uncertain behavior of skin: Secondary | ICD-10-CM | POA: Diagnosis not present

## 2012-02-06 DIAGNOSIS — L570 Actinic keratosis: Secondary | ICD-10-CM | POA: Diagnosis not present

## 2012-02-19 DIAGNOSIS — E119 Type 2 diabetes mellitus without complications: Secondary | ICD-10-CM | POA: Diagnosis not present

## 2012-02-19 DIAGNOSIS — E782 Mixed hyperlipidemia: Secondary | ICD-10-CM | POA: Diagnosis not present

## 2012-02-19 DIAGNOSIS — I10 Essential (primary) hypertension: Secondary | ICD-10-CM | POA: Diagnosis not present

## 2012-02-25 DIAGNOSIS — H01009 Unspecified blepharitis unspecified eye, unspecified eyelid: Secondary | ICD-10-CM | POA: Diagnosis not present

## 2012-02-25 DIAGNOSIS — H10529 Angular blepharoconjunctivitis, unspecified eye: Secondary | ICD-10-CM | POA: Diagnosis not present

## 2012-03-03 DIAGNOSIS — H10529 Angular blepharoconjunctivitis, unspecified eye: Secondary | ICD-10-CM | POA: Diagnosis not present

## 2012-03-03 DIAGNOSIS — H01009 Unspecified blepharitis unspecified eye, unspecified eyelid: Secondary | ICD-10-CM | POA: Diagnosis not present

## 2012-05-11 ENCOUNTER — Other Ambulatory Visit: Payer: Self-pay | Admitting: Dermatology

## 2012-05-11 DIAGNOSIS — C44621 Squamous cell carcinoma of skin of unspecified upper limb, including shoulder: Secondary | ICD-10-CM | POA: Diagnosis not present

## 2012-05-24 DIAGNOSIS — E119 Type 2 diabetes mellitus without complications: Secondary | ICD-10-CM | POA: Diagnosis not present

## 2012-05-24 DIAGNOSIS — R7309 Other abnormal glucose: Secondary | ICD-10-CM | POA: Diagnosis not present

## 2012-05-24 DIAGNOSIS — Z1212 Encounter for screening for malignant neoplasm of rectum: Secondary | ICD-10-CM | POA: Diagnosis not present

## 2012-05-24 DIAGNOSIS — I10 Essential (primary) hypertension: Secondary | ICD-10-CM | POA: Diagnosis not present

## 2012-05-24 DIAGNOSIS — R972 Elevated prostate specific antigen [PSA]: Secondary | ICD-10-CM | POA: Diagnosis not present

## 2012-05-24 DIAGNOSIS — E559 Vitamin D deficiency, unspecified: Secondary | ICD-10-CM | POA: Diagnosis not present

## 2012-05-24 DIAGNOSIS — E782 Mixed hyperlipidemia: Secondary | ICD-10-CM | POA: Diagnosis not present

## 2012-05-24 DIAGNOSIS — Z79899 Other long term (current) drug therapy: Secondary | ICD-10-CM | POA: Diagnosis not present

## 2012-06-02 DIAGNOSIS — E782 Mixed hyperlipidemia: Secondary | ICD-10-CM | POA: Diagnosis not present

## 2012-06-02 DIAGNOSIS — I2510 Atherosclerotic heart disease of native coronary artery without angina pectoris: Secondary | ICD-10-CM | POA: Diagnosis not present

## 2012-06-02 DIAGNOSIS — Z9861 Coronary angioplasty status: Secondary | ICD-10-CM | POA: Diagnosis not present

## 2012-07-23 DIAGNOSIS — N529 Male erectile dysfunction, unspecified: Secondary | ICD-10-CM | POA: Diagnosis not present

## 2012-07-23 DIAGNOSIS — R972 Elevated prostate specific antigen [PSA]: Secondary | ICD-10-CM | POA: Diagnosis not present

## 2012-07-23 DIAGNOSIS — N401 Enlarged prostate with lower urinary tract symptoms: Secondary | ICD-10-CM | POA: Diagnosis not present

## 2012-08-24 DIAGNOSIS — E559 Vitamin D deficiency, unspecified: Secondary | ICD-10-CM | POA: Diagnosis not present

## 2012-08-24 DIAGNOSIS — E782 Mixed hyperlipidemia: Secondary | ICD-10-CM | POA: Diagnosis not present

## 2012-08-24 DIAGNOSIS — Z79899 Other long term (current) drug therapy: Secondary | ICD-10-CM | POA: Diagnosis not present

## 2012-08-24 DIAGNOSIS — E119 Type 2 diabetes mellitus without complications: Secondary | ICD-10-CM | POA: Diagnosis not present

## 2012-08-24 DIAGNOSIS — I10 Essential (primary) hypertension: Secondary | ICD-10-CM | POA: Diagnosis not present

## 2012-08-25 DIAGNOSIS — H905 Unspecified sensorineural hearing loss: Secondary | ICD-10-CM | POA: Diagnosis not present

## 2012-11-03 DIAGNOSIS — H2510 Age-related nuclear cataract, unspecified eye: Secondary | ICD-10-CM | POA: Diagnosis not present

## 2012-11-03 DIAGNOSIS — E119 Type 2 diabetes mellitus without complications: Secondary | ICD-10-CM | POA: Diagnosis not present

## 2012-11-03 DIAGNOSIS — H40019 Open angle with borderline findings, low risk, unspecified eye: Secondary | ICD-10-CM | POA: Diagnosis not present

## 2012-11-03 LAB — HM DIABETES EYE EXAM

## 2012-11-22 DIAGNOSIS — E559 Vitamin D deficiency, unspecified: Secondary | ICD-10-CM | POA: Diagnosis not present

## 2012-11-22 DIAGNOSIS — I10 Essential (primary) hypertension: Secondary | ICD-10-CM | POA: Diagnosis not present

## 2012-11-22 DIAGNOSIS — E782 Mixed hyperlipidemia: Secondary | ICD-10-CM | POA: Diagnosis not present

## 2012-11-22 DIAGNOSIS — E119 Type 2 diabetes mellitus without complications: Secondary | ICD-10-CM | POA: Diagnosis not present

## 2012-11-22 DIAGNOSIS — Z79899 Other long term (current) drug therapy: Secondary | ICD-10-CM | POA: Diagnosis not present

## 2013-01-11 ENCOUNTER — Other Ambulatory Visit: Payer: Self-pay | Admitting: Cardiology

## 2013-01-11 NOTE — Telephone Encounter (Signed)
Rx was sent to pharmacy electronically. 

## 2013-02-15 ENCOUNTER — Ambulatory Visit (INDEPENDENT_AMBULATORY_CARE_PROVIDER_SITE_OTHER): Payer: Medicare Other | Admitting: Cardiology

## 2013-02-15 ENCOUNTER — Encounter: Payer: Self-pay | Admitting: Cardiology

## 2013-02-15 VITALS — BP 120/78 | HR 54 | Ht 70.5 in | Wt 193.8 lb

## 2013-02-15 DIAGNOSIS — I10 Essential (primary) hypertension: Secondary | ICD-10-CM

## 2013-02-15 DIAGNOSIS — E119 Type 2 diabetes mellitus without complications: Secondary | ICD-10-CM | POA: Diagnosis not present

## 2013-02-15 DIAGNOSIS — Z9861 Coronary angioplasty status: Secondary | ICD-10-CM | POA: Diagnosis not present

## 2013-02-15 DIAGNOSIS — Z6825 Body mass index (BMI) 25.0-25.9, adult: Secondary | ICD-10-CM

## 2013-02-15 DIAGNOSIS — I2510 Atherosclerotic heart disease of native coronary artery without angina pectoris: Secondary | ICD-10-CM | POA: Diagnosis not present

## 2013-02-15 DIAGNOSIS — E785 Hyperlipidemia, unspecified: Secondary | ICD-10-CM | POA: Insufficient documentation

## 2013-02-15 DIAGNOSIS — E1121 Type 2 diabetes mellitus with diabetic nephropathy: Secondary | ICD-10-CM | POA: Insufficient documentation

## 2013-02-15 DIAGNOSIS — E663 Overweight: Secondary | ICD-10-CM

## 2013-02-15 DIAGNOSIS — F17200 Nicotine dependence, unspecified, uncomplicated: Secondary | ICD-10-CM

## 2013-02-15 MED ORDER — LISINOPRIL 5 MG PO TABS: 5.0000 mg | ORAL_TABLET | Freq: Every day | ORAL | Status: DC

## 2013-02-15 MED ORDER — CLOPIDOGREL BISULFATE 75 MG PO TABS: 75.0000 mg | ORAL_TABLET | Freq: Every day | ORAL | Status: DC

## 2013-02-15 NOTE — Patient Instructions (Addendum)
Overall you seem to be doing well. You have lost about 9 pounds which he should be congratulated.  Your blood pressure is great today.    I will see you back in 6 months.  Contact us sooner if any problems arise.

## 2013-02-22 ENCOUNTER — Encounter: Payer: Self-pay | Admitting: Cardiology

## 2013-02-22 DIAGNOSIS — F17200 Nicotine dependence, unspecified, uncomplicated: Secondary | ICD-10-CM | POA: Insufficient documentation

## 2013-02-22 NOTE — Assessment & Plan Note (Addendum)
Doing very well and stable. He is on Plavix but not aspirin due to GI issues. He is taking that yet and seems to be tolerating it okay for his lipids. He is on lisinopril and no beta blocker due to bradycardia.  Congratulated on his diet/exercise with notable weight loss. Continue to monitor for signs and symptoms of angina. For now continue aggressive risk factor modification with his existing disease. Slow disease progression. We talked about smoking cessation, but he continues to use a pipe, and does not seem interested in quitting.

## 2013-02-22 NOTE — Assessment & Plan Note (Addendum)
Seems to be tolerate Zetia. He should be due for labs to be checked as primary physician. Goal LDL less than 70, HDL greater than 40. May need to consider using TriCor or Triliptix in addition to Zetia, but by last check I saw he was doing relatively well with total cholesterol 180, triglycerides 223, HDL 44 and LDL 91.  Monitored by primary physician.

## 2013-02-22 NOTE — Progress Notes (Signed)
Patient ID: David [name], male   DOB: 1936-01-14, 77 y.o.   MRN: [id number] PCP: Nadean Corwin, MD  Clinic Note: Chief Complaint  Patient presents with  . 6 month visit    no chest pain ,no sob,no edema (rashes),DM   HPI: David [name] is a 77 y.o. male with a PMH below who presents today for followup of his coronary artery disease.  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 He suffered a non-ST elevation MI in June of 2011 where he underwent PCI to the LAD with a Promus element DES 2.5 mm 15 mm stent. He was followed by Dr. Caprice Kluver, until his retirement. I saw him for the first time back in November of last year. I actually met him in February 2012 performed cardiac catheterization in response to his episode of significant chest pain. He was actually noted to have a moderate lesion (roughly 60-70 percent) in the LAD proximal to the widely patent stent. This was evaluated By [employer] that demonstrated a ratio of 0.82 that is just above the threshold for physiologically significant. The decision made to continue with medical therapy. He also had a 90% RV marginal branch stenosis. As part of the medical therapy, we'll use a beta blocker, but he is not able tolerate that he did bradycardia. It completely stopped it during his last visit in November, and he is noted a little better effort and exercise tolerance.  Interval History: He is still doing well. He is able to be his activity was to be, and knows that overall he is less short of breath and he is able to mow the lawn both push mower and riding mower. He splits wood and does other yard work. He doesn't do routine exercise but he gets plenty of exercise by working outside. He denies any chest pain or shortness of breath associated with this activity. He does get a little more tired than he used to he is dropped about 9 pounds due to his significant dietary changes in order to better  treat his diabetes. He not eating nearly as much bread, and is using olive oil along the needs and vegetables. He is also increase his activity level. He denies any PND, orthopnea or edema. He does note occasional positional dizziness (if he bends down and then quickly stands back up again.  The remainder of cardiac review of systems is as follows: Cardiovascular ROS: no chest pain or dyspnea on exertion negative for - edema, irregular heartbeat, loss of consciousness, murmur, orthopnea, palpitations, paroxysmal nocturnal dyspnea or rapid heart rate : Additional cardiac review of systems: Syncope/near-syncope - no; TIA/amaurosis fugax - no Melena - no, hematochezia no; hematuria - no; nosebleeds - no; claudication - no  Past Medical History  Diagnosis Date  . Hypertension   . Hyperlipidemia   . Diabetes mellitus without complication   . BPH (benign prostatic hyperplasia)   . CAD S/P percutaneous coronary angioplasty     PCI to LAD: Promus Element 2.5 mm x 50 mm DES; for non-STEMI  . History of: Non-ST elevated myocardial infarction (non-STEMI) June 2011   Prior Cardiac Evaluation and Past Surgical History: Past Surgical History  Procedure Laterality Date  . Cardiac catheterization  12/2009    Proximal LAD stenosis followed by a significant 80-90% distal stenosis  . Coronary angioplasty  12/2009    PTCA to proximal LAD; PCI with Promus Element DES stent  2.5 mm x 15 mm  distalLAD - .for non-ST elevation MI  . Cardiac catheterization  February 2012     90% ostial RV marginal branch; 60-70% proximal LAD with widely patent distal stent. FFR 0.82; medical therapy   Allergies  Allergen Reactions  . Statins Other (See Comments)    Myalgias and cramping Both Crestor and Lipitor  . Tetanus Toxoids     Current Outpatient Prescriptions  Medication Sig Dispense Refill  . cholecalciferol (VITAMIN D) 1000 UNITS tablet Take 2000 mg 2 tablets in the morning      . CINNAMON PO Take 1,000 mg by  mouth 2 (two) times daily.      . clopidogrel (PLAVIX) 75 MG tablet Take 1 tablet (75 mg total) by mouth daily.  90 tablet  3  . FINASTERIDE PO Take 2.5 mg by mouth.      . metFORMIN (GLUCOPHAGE) 500 MG tablet Take 500 mg by mouth 2 (two) times daily with a meal.      . doxazosin (CARDURA) 4 MG tablet       . lisinopril (PRINIVIL,ZESTRIL) 5 MG tablet Take 1 tablet (5 mg total) by mouth daily.  90 tablet  3  . predniSONE (DELTASONE) 10 MG tablet       . ZETIA 10 MG tablet        No current facility-administered medications for this visit.    History   Social History  . Marital Status: Married    Spouse Name: N/A    Number of Children: N/A  . Years of Education: N/A   Occupational History  . Not on file.   Social History Main Topics  . Smoking status: Current Every Day Smoker -- 60 years    Types: Pipe  . Smokeless tobacco: Not on file  . Alcohol Use: Not on file  . Drug Use: No  . Sexually Active: Not on file   Other Topics Concern  . Not on file   Social History Narrative   He is a married father of 2 with no grandchildren as of yet. He continued to smoke a pipe several times the course of day. He does have an occasional alcoholic beverage. While not involved a standard exercise routine, he is very active with walking and working in the yard, splitting wood and doing aggressive yard work including Scientist, physiological.   ROS: A comprehensive Review of Systems - Negative except Pertinent positives in the history of present illness. Otherwise mild arthritic pain. He does not have any more rash on his arms but he does have rashes on his feet occasionally.  PHYSICAL EXAM BP 120/78  Pulse 54  Ht 5' 10.5" (1.791 m)  Wt 193 lb 12.8 oz (87.907 kg)  BMI 27.41 kg/m2 -- back down 9 pounds since last visit which is 3 pounds last than any previously been General appearance: alert, cooperative, appears stated age, no distress and healthy-appearing, well-nourished and  well-groomed.  Answers questions appropriately. HEENT: [place]/AT, EOMI, MMM, anicteric sclera Neck: no adenopathy, no carotid bruit, no JVD, supple, symmetrical, trachea midline and thyroid not enlarged, symmetric, no tenderness/mass/nodules Lungs: clear to auscultation bilaterally, normal percussion bilaterally and nonlabored, good air movement. No W./R../R. Heart: regular rate and rhythm, S1, S2 normal, no murmur, click, rub or gallop and normal apical impulse Abdomen: soft, non-tender; bowel sounds normal; no masses,  no organomegaly Extremities: extremities normal, atraumatic, no cyanosis or edema; no significant rash  Pulses: 2+ and symmetric Neurologic: Alert and oriented X 3, normal strength and  tone. Normal symmetric reflexes. Normal coordination and gait  WUJ:[id number] today: Yes Rate: 54 , Rhythm: Sinus bradycardia, Left Anterior Fascicular Block, nonspecific ST changes. No significant change overall;  C  Recent Labs: None currently  ASSESSMENT / PLAN:   CAD S/P percutaneous coronary angioplasty and PCI- DES mid LAD in the setting of non-STEMI Doing very well and stable. He is on Plavix but not aspirin due to GI issues. He is taking that yet and seems to be tolerating it okay for his lipids. He is on lisinopril and no beta blocker due to bradycardia.  Congratulated on his diet/exercise with notable weight loss. Continue to monitor for signs and symptoms of angina. For now continue aggressive risk factor modification with his existing disease. Slow disease progression. We talked about smoking cessation, but he continues to use a pipe, and does not seem interested in quitting.  Hyperlipidemia with statin intolerance Seems to be tolerate Zetia. He should be due for labs to be checked as primary physician. Goal LDL less than 70, HDL greater than 40. May need to consider using TriCor or Triliptix in addition to Zetia, but by last check I saw he was doing relatively well with total  cholesterol 180, triglycerides 223, HDL 44 and LDL 91.  Monitored by primary physician.  Hypertension Adequately controlled on current regimen.   Orders Placed This Encounter  Procedures  . EKG 12-Lead   Followup: Six-month  Yeni Jiggetts W. Herbie Baltimore, M.D., M.S. [facility] [address] [place], [place]  [address]  [phone] Pager # [phone]

## 2013-02-22 NOTE — Assessment & Plan Note (Signed)
Adequately controlled on current regimen.

## 2013-02-23 DIAGNOSIS — E663 Overweight: Secondary | ICD-10-CM | POA: Insufficient documentation

## 2013-03-03 DIAGNOSIS — E559 Vitamin D deficiency, unspecified: Secondary | ICD-10-CM | POA: Diagnosis not present

## 2013-03-03 DIAGNOSIS — E782 Mixed hyperlipidemia: Secondary | ICD-10-CM | POA: Diagnosis not present

## 2013-03-03 DIAGNOSIS — Z79899 Other long term (current) drug therapy: Secondary | ICD-10-CM | POA: Diagnosis not present

## 2013-03-03 DIAGNOSIS — I10 Essential (primary) hypertension: Secondary | ICD-10-CM | POA: Diagnosis not present

## 2013-03-03 DIAGNOSIS — E119 Type 2 diabetes mellitus without complications: Secondary | ICD-10-CM | POA: Diagnosis not present

## 2013-04-12 ENCOUNTER — Other Ambulatory Visit: Payer: Self-pay | Admitting: *Deleted

## 2013-05-31 ENCOUNTER — Encounter: Payer: Self-pay | Admitting: Internal Medicine

## 2013-06-01 ENCOUNTER — Ambulatory Visit: Payer: Medicare Other | Admitting: Internal Medicine

## 2013-06-01 ENCOUNTER — Encounter: Payer: Self-pay | Admitting: Internal Medicine

## 2013-06-01 VITALS — BP 124/62 | HR 84 | Temp 97.7°F | Resp 18 | Ht 70.75 in | Wt 196.2 lb

## 2013-06-01 DIAGNOSIS — Z125 Encounter for screening for malignant neoplasm of prostate: Secondary | ICD-10-CM

## 2013-06-01 DIAGNOSIS — Z1212 Encounter for screening for malignant neoplasm of rectum: Secondary | ICD-10-CM

## 2013-06-01 DIAGNOSIS — E559 Vitamin D deficiency, unspecified: Secondary | ICD-10-CM

## 2013-06-01 DIAGNOSIS — Z0000 Encounter for general adult medical examination without abnormal findings: Secondary | ICD-10-CM

## 2013-06-01 DIAGNOSIS — Z23 Encounter for immunization: Secondary | ICD-10-CM

## 2013-06-01 DIAGNOSIS — Z79899 Other long term (current) drug therapy: Secondary | ICD-10-CM | POA: Diagnosis not present

## 2013-06-01 DIAGNOSIS — E782 Mixed hyperlipidemia: Secondary | ICD-10-CM

## 2013-06-01 DIAGNOSIS — E119 Type 2 diabetes mellitus without complications: Secondary | ICD-10-CM | POA: Diagnosis not present

## 2013-06-01 DIAGNOSIS — I10 Essential (primary) hypertension: Secondary | ICD-10-CM | POA: Diagnosis not present

## 2013-06-01 LAB — CBC WITH DIFFERENTIAL/PLATELET
Basophils Relative: 0 % (ref 0–1)
Eosinophils Absolute: 0.4 10*3/uL (ref 0.0–0.7)
Eosinophils Relative: 4 % (ref 0–5)
Lymphs Abs: 1.7 10*3/uL (ref 0.7–4.0)
MCH: 33.1 pg (ref 26.0–34.0)
MCHC: 35.1 g/dL (ref 30.0–36.0)
MCV: 94.4 fL (ref 78.0–100.0)
Monocytes Relative: 10 % (ref 3–12)
Neutrophils Relative %: 68 % (ref 43–77)
Platelets: 258 10*3/uL (ref 150–400)
RBC: 4.83 MIL/uL (ref 4.22–5.81)

## 2013-06-01 LAB — BASIC METABOLIC PANEL WITH GFR
CO2: 29 mEq/L (ref 19–32)
Calcium: 9.6 mg/dL (ref 8.4–10.5)
Creat: 0.96 mg/dL (ref 0.50–1.35)
GFR, Est African American: 89 mL/min
Glucose, Bld: 123 mg/dL — ABNORMAL HIGH (ref 70–99)
Sodium: 139 mEq/L (ref 135–145)

## 2013-06-01 LAB — HEPATIC FUNCTION PANEL
ALT: 19 U/L (ref 0–53)
AST: 18 U/L (ref 0–37)
Albumin: 4.2 g/dL (ref 3.5–5.2)
Total Protein: 6.9 g/dL (ref 6.0–8.3)

## 2013-06-01 LAB — LIPID PANEL
Cholesterol: 177 mg/dL (ref 0–200)
Triglycerides: 224 mg/dL — ABNORMAL HIGH (ref <150)

## 2013-06-01 LAB — HEMOGLOBIN A1C: Mean Plasma Glucose: 120 mg/dL — ABNORMAL HIGH (ref <117)

## 2013-06-01 LAB — MAGNESIUM: Magnesium: 2 mg/dL (ref 1.5–2.5)

## 2013-06-01 NOTE — Patient Instructions (Signed)
Continue diet and meds as discussed. Further disposition pending results of labs.   Hypertension As your heart beats, it forces blood through your arteries. This force is your blood pressure. If the pressure is too high, it is called hypertension (HTN) or high blood pressure. HTN is dangerous because you may have it and not know it. High blood pressure may mean that your heart has to work harder to pump blood. Your arteries may be narrow or stiff. The extra work puts you at risk for heart disease, stroke, and other problems.  Blood pressure consists of two numbers, a higher number over a lower, 110/72, for example. It is stated as "110 over 72." The ideal is below 120 for the top number (systolic) and under 80 for the bottom (diastolic). Write down your blood pressure today. You should pay close attention to your blood pressure if you have certain conditions such as:  Heart failure.  Prior heart attack.  Diabetes  Chronic kidney disease.  Prior stroke.  Multiple risk factors for heart disease. To see if you have HTN, your blood pressure should be measured while you are seated with your arm held at the level of the heart. It should be measured at least twice. A one-time elevated blood pressure reading (especially in the Emergency Department) does not mean that you need treatment. There may be conditions in which the blood pressure is different between your right and left arms. It is important to see your caregiver soon for a recheck. Most people have essential hypertension which means that there is not a specific cause. This type of high blood pressure may be lowered by changing lifestyle factors such as:  Stress.  Smoking.  Lack of exercise.  Excessive weight.  Drug/tobacco/alcohol use.  Eating less salt. Most people do not have symptoms from high blood pressure until it has caused damage to the body. Effective treatment can often prevent, delay or reduce that damage. TREATMENT    When a cause has been identified, treatment for high blood pressure is directed at the cause. There are a large number of medications to treat HTN. These fall into several categories, and your caregiver will help you select the medicines that are best for you. Medications may have side effects. You should review side effects with your caregiver. If your blood pressure stays high after you have made lifestyle changes or started on medicines,   Your medication(s) may need to be changed.  Other problems may need to be addressed.  Be certain you understand your prescriptions, and know how and when to take your medicine.  Be sure to follow up with your caregiver within the time frame advised (usually within two weeks) to have your blood pressure rechecked and to review your medications.  If you are taking more than one medicine to lower your blood pressure, make sure you know how and at what times they should be taken. Taking two medicines at the same time can result in blood pressure that is too low. SEEK IMMEDIATE MEDICAL CARE IF:  You develop a severe headache, blurred or changing vision, or confusion.  You have unusual weakness or numbness, or a faint feeling.  You have severe chest or abdominal pain, vomiting, or breathing problems. MAKE SURE YOU:   Understand these instructions.  Will watch your condition.  Will get help right away if you are not doing well or get worse. Document Released: 07/07/2005 Document Revised: 09/29/2011 Document Reviewed: 02/25/2008 Exit[employer] Patient Information 2014 [employer].      Cholesterol Cholesterol is a white, waxy, fat-like protein needed by your body in small amounts. The liver makes all the cholesterol you need. It is carried from the liver by the blood through the blood vessels. Deposits (plaque) may build up on blood vessel walls. This makes the arteries narrower and stiffer. Plaque increases the risk for heart attack and stroke. You  cannot feel your cholesterol level even if it is very high. The only way to know is by a blood test to check your lipid (fats) levels. Once you know your cholesterol levels, you should keep a record of the test results. Work with your caregiver to to keep your levels in the desired range. WHAT THE RESULTS MEAN:  Total cholesterol is a rough measure of all the cholesterol in your blood.  LDL is the so-called bad cholesterol. This is the type that deposits cholesterol in the walls of the arteries. You want this level to be low.  HDL is the good cholesterol because it cleans the arteries and carries the LDL away. You want this level to be high.  Triglycerides are fat that the body can either burn for energy or store. High levels are closely linked to heart disease. DESIRED LEVELS:  Total cholesterol below 200.  LDL below 100 for people at risk, below 70 for very high risk.  HDL above 50 is good, above 60 is best.  Triglycerides below 150. HOW TO LOWER YOUR CHOLESTEROL:  Diet.  Choose fish or white meat chicken and turkey, roasted or baked. Limit fatty cuts of red meat, fried foods, and processed meats, such as sausage and lunch meat.  Eat lots of fresh fruits and vegetables. Choose whole grains, beans, pasta, potatoes and cereals.  Use only small amounts of olive, corn or canola oils. Avoid butter, mayonnaise, shortening or palm kernel oils. Avoid foods with trans-fats.  Use skim/nonfat milk and low-fat/nonfat yogurt and cheeses. Avoid whole milk, cream, ice cream, egg yolks and cheeses. Healthy desserts include angel food cake, ginger snaps, animal crackers, hard candy, popsicles, and low-fat/nonfat frozen yogurt. Avoid pastries, cakes, pies and cookies.  Exercise.  A regular program helps decrease LDL and raises HDL.  Helps with weight control.  Do things that increase your activity level like gardening, walking, or taking the stairs.  Medication.  May be prescribed by your  caregiver to help lowering cholesterol and the risk for heart disease.  You may need medicine even if your levels are normal if you have several risk factors. HOME CARE INSTRUCTIONS   Follow your diet and exercise programs as suggested by your caregiver.  Take medications as directed.  Have blood work done when your caregiver feels it is necessary. MAKE SURE YOU:   Understand these instructions.  Will watch your condition.  Will get help right away if you are not doing well or get worse. Document Released: 04/01/2001 Document Revised: 09/29/2011 Document Reviewed: 09/22/2007 Exit[employer] Patient Information 2014 [employer]. Diabetes and Exercise Exercising regularly is important. It is not just about losing weight. It has many health benefits, such as:  Improving your overall fitness, flexibility, and endurance.  Increasing your bone density.  Helping with weight control.  Decreasing your body fat.  Increasing your muscle strength.  Reducing stress and tension.  Improving your overall health. People with diabetes who exercise gain additional benefits because exercise:  Reduces appetite.  Improves the body's use of blood sugar (glucose).  Helps lower or control blood glucose.  Decreases blood pressure.  Helps control   blood lipids (such as cholesterol and triglycerides).  Improves the body's use of the hormone insulin by:  Increasing the body's insulin sensitivity.  Reducing the body's insulin needs.  Decreases the risk for heart disease because exercising:  Lowers cholesterol and triglycerides levels.  Increases the levels of good cholesterol (such as high-density lipoproteins [HDL]) in the body.  Lowers blood glucose levels. YOUR ACTIVITY PLAN  Choose an activity that you enjoy and set realistic goals. Your health care provider or diabetes educator can help you make an activity plan that works for you. You can break activities into 2 or 3 sessions  throughout the day. Doing so is as good as one long session. Exercise ideas include:  Taking the dog for a walk.  Taking the stairs instead of the elevator.  Dancing to your favorite song.  Doing your favorite exercise with a friend. RECOMMENDATIONS FOR EXERCISING WITH TYPE 1 OR TYPE 2 DIABETES   Check your blood glucose before exercising. If blood glucose levels are greater than 240 mg/dL, check for urine ketones. Do not exercise if ketones are present.  Avoid injecting insulin into areas of the body that are going to be exercised. For example, avoid injecting insulin into:  The arms when playing tennis.  The legs when jogging.  Keep a record of:  Food intake before and after you exercise.  Expected peak times of insulin action.  Blood glucose levels before and after you exercise.  The type and amount of exercise you have done.  Review your records with your health care provider. Your health care provider will help you to develop guidelines for adjusting food intake and insulin amounts before and after exercising.  If you take insulin or oral hypoglycemic agents, watch for signs and symptoms of hypoglycemia. They include:  Dizziness.  Shaking.  Sweating.  Chills.  Confusion.  Drink plenty of water while you exercise to prevent dehydration or heat stroke. Body water is lost during exercise and must be replaced.  Talk to your health care provider before starting an exercise program to make sure it is safe for you. Remember, almost any type of activity is better than none. Document Released: 09/27/2003 Document Revised: 03/09/2013 Document Reviewed: 12/14/2012 [employer] Patient Information 2014 [employer].  Vitamin D Deficiency Vitamin D is an important vitamin that your body needs. Having too little of it in your body is called a deficiency. A very bad deficiency can make your bones soft and can cause a condition called rickets.  Vitamin D is important to your  body for different reasons, such as:   It helps your body absorb 2 minerals called calcium and phosphorus.  It helps make your bones healthy.  It may prevent some diseases, such as diabetes and multiple sclerosis.  It helps your muscles and heart. You can get vitamin D in several ways. It is a natural part of some foods. The vitamin is also added to some dairy products and cereals. Some people take vitamin D supplements. Also, your body makes vitamin D when you are in the sun. It changes the sun's rays into a form of the vitamin that your body can use. CAUSES   Not eating enough foods that contain vitamin D.  Not getting enough sunlight.  Having certain digestive system diseases that make it hard to absorb vitamin D. These diseases include Crohn's disease, chronic pancreatitis, and cystic fibrosis.  Having a surgery in which part of the stomach or small intestine is removed.  Being obese.   Fat cells pull vitamin D out of your blood. That means that obese people may not have enough vitamin D left in their blood and in other body tissues.  Having chronic kidney or liver disease. RISK FACTORS Risk factors are things that make you more likely to develop a vitamin D deficiency. They include:  Being older.  Not being able to get outside very much.  Living in a nursing home.  Having had broken bones.  Having weak or thin bones (osteoporosis).  Having a disease or condition that changes how your body absorbs vitamin D.  Having dark skin.  Some medicines such as seizure medicines or steroids.  Being overweight or obese. SYMPTOMS Mild cases of vitamin D deficiency may not have any symptoms. If you have a very bad case, symptoms may include:  Bone pain.  Muscle pain.  Falling often.  Broken bones caused by a minor injury, due to osteoporosis. DIAGNOSIS A blood test is the best way to tell if you have a vitamin D deficiency. TREATMENT Vitamin D deficiency can be treated in  different ways. Treatment for vitamin D deficiency depends on what is causing it. Options include:  Taking vitamin D supplements.  Taking a calcium supplement. Your caregiver will suggest what dose is best for you. HOME CARE INSTRUCTIONS  Take any supplements that your caregiver prescribes. Follow the directions carefully. Take only the suggested amount.  Have your blood tested 2 months after you start taking supplements.  Eat foods that contain vitamin D. Healthy choices include:  Fortified dairy products, cereals, or juices. Fortified means vitamin D has been added to the food. Check the label on the package to be sure.  Fatty fish like salmon or trout.  Eggs.  Oysters.  Do not use a tanning bed.  Keep your weight at a healthy level. Lose weight if you need to.  Keep all follow-up appointments. Your caregiver will need to perform blood tests to make sure your vitamin D deficiency is going away. SEEK MEDICAL CARE IF:  You have any questions about your treatment.  You continue to have symptoms of vitamin D deficiency.  You have nausea or vomiting.  You are constipated.  You feel confused.  You have severe abdominal or back pain. MAKE SURE YOU:  Understand these instructions.  Will watch your condition.  Will get help right away if you are not doing well or get worse. Document Released: 09/29/2011 Document Revised: 11/01/2012 Document Reviewed: 09/29/2011 [employer] Patient Information 2014 [employer].   

## 2013-06-01 NOTE — Progress Notes (Signed)
Patient ID: David [name], male   DOB: [birth year], 77 y.o.   MRN: [id number]   HPI Very nice 77 yo MWM  presents for complete physical.  Patient's HTN predates to year 2000. He had PTCA/DES to the LAD in nov 2010.Then he had a  nonSTMI and stent in June 2011.  Cath in feb 2012 showed patent stents w/EF 50%. BP has been controlled at home. Patient denies any cardiac Symptoms as chest pain, palpitations, shortness of breath, dizziness or ankle swelling. He is currentlly followed by Dr Herbie Baltimore.  Patient's hyperlipidemia is controlled with diet and medications. Patient denies myalgias or other medication SE's. Last cholesterol last visit was 182 and LDL  99 in august. He is fairly active in physical activities.  Patient has diabetes since 2000 with last A1c  6.2% in august. Patient denies reactive hypoglycemic symptoms, visual blurring, diabetic polys, or paresthesias.   Other problems include Vitamin D Deficiency for which patient is supplementing Vitamin D.      Medication List       cholecalciferol 1000 UNITS tablet  Commonly known as:  VITAMIN D  Take 2000 mg 2 tablets in the morning     CINNAMON PO  Take 1,000 mg by mouth 2 (two) times daily.     clopidogrel 75 MG tablet  Commonly known as:  PLAVIX  Take 1 tablet (75 mg total) by mouth daily.     doxazosin 4 MG tablet  Commonly known as:  CARDURA     finasteride 5 MG tablet 1/2 tab = 2.5 mg qd  Commonly known as:  PROSCAR  Take 2.5 mg by mouth daily.     lisinopril 5 MG tablet  Commonly known as:  PRINIVIL,ZESTRIL  Take 1 tablet (5 mg total) by mouth daily.     metFORMIN 500 MG tablet  Commonly known as:  GLUCOPHAGE  Take 500 mg by mouth 2 (two) times daily with a meal.     ZETIA 10 MG tablet  Generic drug:  ezetimibe        Health Maintenance  Topic Date Due  . Tetanus/tdap  02/06/1957  . Colonoscopy  02/07/1988  . Zostavax  02/06/1998  . Pneumococcal Polysaccharide Vaccine Age 60 And Over  02/07/2003  .  Influenza Vaccine  02/18/2013    Allergies  Allergen Reactions  . Statins Other (See Comments)    Myalgias and cramping Both Crestor and Lipitor  . Tetanus Toxoids     Past Medical History  Diagnosis Date  . Hypertension   . Hyperlipidemia   . Diabetes mellitus without complication   . BPH (benign prostatic hyperplasia)   . CAD S/P percutaneous coronary angioplasty     PCI to LAD: Promus Element 2.5 mm x 50 mm DES; for non-STEMI  . History of: Non-ST elevated myocardial infarction (non-STEMI) June 2011    Past Surgical History  Procedure Laterality Date  . Cardiac catheterization  12/2009    Proximal LAD stenosis followed by a significant 80-90% distal stenosis  . Coronary angioplasty  12/2009    PTCA to proximal LAD; PCI with Promus Element DES stent  2.5 mm x 15 mm  distalLAD - .for non-ST elevation MI  . Cardiac catheterization  February 2012     90% ostial RV marginal branch; 60-70% proximal LAD with widely patent distal stent. FFR 0.82; medical therapy    Family History  Problem Relation Age of Onset  . Cancer Father     lung  . Cancer Sister  breast  . Cancer Brother     lung  . Cancer Son     history of prostate    History   Social History  . Marital Status: Married    Spouse Name: N/A    Number of Children: N/A  . Years of Education: N/A   Occupational History  . Not on file.   Social History Main Topics  . Smoking status: Current Every Day Smoker -- 60 years    Types: Pipe  . Smokeless tobacco: Never Used     Comment: smokes pipe qd  . Alcohol Use: 3.5 oz/week    7 drink(s) per week  . Drug Use: No  . Sexual Activity: Not on file   Other Topics Concern  . Not on file   Social History Narrative   He is a married father of 2 with no grandchildren as of yet. He continued to smoke a pipe several times the course of day. He does have an occasional alcoholic beverage. While not involved a standard exercise routine, he is very active with  walking and working in the yard, splitting wood and doing aggressive yard work including Scientist, physiological.    SYSTEMS REVIEW Constitutional: Denies fever, chills, weight loss/gain, headaches, insomnia, fatigue, night sweats, and change in appetite. Eyes: Denies redness, blurred vision, diplopia, discharge, itchy, watery eyes.  ENT: Denies discharge, congestion, post nasal drip, epistaxis, sore throat, earache, hearing loss, dental pain, Tinnitus, Vertigo, Sinus pain, snoring.  Cardio: Denies chest pain, palpitations, irregular heartbeat, syncope, dyspnea, diaphoresis, orthopnea, PND, claudication, edema Respiratory: denies cough, dyspnea, DOE, pleurisy, hoarseness, laryngitis, wheezing.  Gastrointestinal: Denies dysphagia, heartburn, reflux, water brash, pain, cramps, nausea, vomiting, bloating, diarrhea, constipation, hematemesis, melena, hematochezia, jaundice, hemorrhoids Genitourinary: Denies dysuria, frequency, urgency, nocturia, hesitancy, discharge, hematuria, flank pain Musculoskeletal: Denies arthralgia, myalgia, stiffness, Jt. Swelling, pain, limp, and strain/sprain. Skin: Denies puritis, rash, hives, warts, acne, eczema, changing in skin lesion Neuro: Weakness, tremor, incoordination, spasms, paresthesia, pain Psychiatric: Denies confusion, memory loss, sensory loss Endocrine: Denies change in weight, skin, hair change, nocturia, and paresthesia, Diabetic Polys, visual blurring, hyper /hypo glycemic episodes.  Heme/Lymph: Excessive bleeding, bruising, enlarged lymph node   Physical Exam: Filed Vitals:   06/01/13 1410  BP: 124/62  Pulse: 84  Temp: 97.7 F (36.5 C)  Resp: 18   Body mass index is 27.56 kg/(m^2).  General Appearance: Well nourished, in no apparent distress. Eyes: PERRLA, EOMs, conjunctiva no swelling or erythema, normal fundi and vessels. Sinuses: No Frontal/maxillary tenderness ENT/Mouth: EACs Patent / TMs  nl. Nares clear without erythema,  swelling, mucoid exudates. Good dentition. No erythema, swelling, or exudate on post pharynx. Tongue normal, non-obstructing. Tonsils not swollen or erythematous. Hearing normal.  Neck: Supple, thyroid normal. No bruits or JVD. Respiratory: Respiratory effort normal.  BS equal bilaterally without rales, rhonci, wheezing or stridor. Cardio: Heart sounds normal, regular rate and rhythm without murmurs, rubs or gallops. Peripheral pulses brisk and equal bilaterally, without edema. No aortic or femoral bruits. Chest: symmetric, with normal excursions and percussion. Abdomen: Flat, soft, with bowl sounds. Nontender, no guarding, rebound, hernias, masses, or organomegaly.  Lymphatics: Non tender without lymphadenopathy.  Genitourinary: No hernias. Testes nl. DRE-prostate nl for age Musculoskeletal: Full ROM all peripheral extremities, joint stability, 5/5 strength, and normal gait. Skin: Warm, dry without rashes, lesions, ecchymosis.  Neuro: Cranial nerves intact, reflexes equal bilaterally. Normal muscle tone, no cerebellar symptoms. Sensation intact.  Pysch: Awake and oriented X 3, normal affect, Insight  and Judgment appropriate.   Assessment and Plan  1.Hypertension - continue diet, exercise and meds as discussed  2. Hyperlipidemia - continue low cholesterol diet, exercise and meds as discussed  3. NIDDM - diet, exercise, weight loss as discussed  4. Vitamin D Deficiency - Supplement as discussed  5. ASCAD - stable - continue f/u per Dr Herbie Baltimore.   Plan routine screening labs, EKG, hemoccult, Aortoscan

## 2013-06-02 ENCOUNTER — Telehealth: Payer: Self-pay | Admitting: *Deleted

## 2013-06-02 LAB — MICROALBUMIN / CREATININE URINE RATIO
Creatinine, Urine: 82.6 mg/dL
Microalb Creat Ratio: 6.1 mg/g (ref 0.0–30.0)
Microalb, Ur: 0.5 mg/dL (ref 0.00–1.89)

## 2013-06-02 LAB — TSH: TSH: 2.277 u[IU]/mL (ref 0.350–4.500)

## 2013-06-02 LAB — VITAMIN D 25 HYDROXY (VIT D DEFICIENCY, FRACTURES): Vit D, 25-Hydroxy: 74 ng/mL (ref 30–89)

## 2013-06-02 LAB — INSULIN, FASTING: Insulin fasting, serum: 42 u[IU]/mL — ABNORMAL HIGH (ref 3–28)

## 2013-06-02 LAB — URINALYSIS, MICROSCOPIC ONLY
Crystals: NONE SEEN
Squamous Epithelial / LPF: NONE SEEN

## 2013-06-02 NOTE — Telephone Encounter (Signed)
Message copied by Reggy Eye on Thu Jun 02, 2013  4:10 PM ------      Message from: Lucky Cowboy      Created: Thu Jun 02, 2013  8:30 AM       Call - chol 177/ ldl 82 excellent - trig sl elevated - Triglycerides or fats in blood are too high - recommend avoid fried & greasy foods,  sweets/candy, white rice (brown or wild rice or Quinoa is OK), white potatoes (sweet potatoes are OK) - anything made from white flour - bagels, doughnuts, rolls, buns, biscuits,white and wheat breads, pizza crust and traditional pasta made of white flour & egg white(vegetarian pasta or spinach or wheat pasta is OK).  Multi-grain bread is OK - like multi-grain flat bread or sandwich thins. Avoid alcohol in excess. Exercise is also important.            A1c 5.8% great but insulin level is high - avoid sweets/candy and white stuff - vit 74 great -             psa 1..82 very low great            All else nl / ok ------

## 2013-08-12 ENCOUNTER — Encounter: Payer: Self-pay | Admitting: Cardiology

## 2013-08-15 ENCOUNTER — Encounter: Payer: Self-pay | Admitting: Cardiology

## 2013-08-15 ENCOUNTER — Ambulatory Visit (INDEPENDENT_AMBULATORY_CARE_PROVIDER_SITE_OTHER): Payer: Medicare Other | Admitting: Cardiology

## 2013-08-15 VITALS — BP 120/82 | HR 67 | Ht 70.5 in | Wt 195.6 lb

## 2013-08-15 DIAGNOSIS — E785 Hyperlipidemia, unspecified: Secondary | ICD-10-CM

## 2013-08-15 DIAGNOSIS — E663 Overweight: Secondary | ICD-10-CM

## 2013-08-15 DIAGNOSIS — F17200 Nicotine dependence, unspecified, uncomplicated: Secondary | ICD-10-CM | POA: Diagnosis not present

## 2013-08-15 DIAGNOSIS — I2510 Atherosclerotic heart disease of native coronary artery without angina pectoris: Secondary | ICD-10-CM | POA: Diagnosis not present

## 2013-08-15 DIAGNOSIS — I10 Essential (primary) hypertension: Secondary | ICD-10-CM | POA: Diagnosis not present

## 2013-08-15 DIAGNOSIS — Z9861 Coronary angioplasty status: Secondary | ICD-10-CM

## 2013-08-15 NOTE — Assessment & Plan Note (Signed)
This is been monitored by his PCP. He is only on that yet. He is been intolerant of TriCor and her lipids in the past as well as statins. We're limited to the study and his exercise.

## 2013-08-15 NOTE — Patient Instructions (Signed)
Continue  With current medication.  Your physician wants you to follow-up in 6 months Dr Ellyn Hack.  You will receive a reminder letter in the mail two months in advance. If you don't receive a letter, please call our office to schedule the follow-up appointment.

## 2013-08-15 NOTE — Assessment & Plan Note (Signed)
I do not waste my time talking to him today. He got me it before started talking about smoking cessation.

## 2013-08-15 NOTE — Assessment & Plan Note (Signed)
Stable on low-dose ACE inhibitor.

## 2013-08-15 NOTE — Assessment & Plan Note (Signed)
Maintaining weight loss. Continue to recommend dietary modification and continued exercise/physical activity.

## 2013-08-15 NOTE — Progress Notes (Signed)
Patient ID: David [name], male   DOB: September 20, 1935, 78 y.o.   MRN: [id number] PCP: Alesia Richards, MD  Clinic Note: Chief Complaint  Patient presents with  . ROV 6 months    C/o leg pain and occas lightheadedness.   HPI: David [name] is a 78 y.o. male with a PMH below who presents today for followup of his coronary artery disease.  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  Cardiac History:  non-ST elevation MI in June of 2011:  PCI to the LAD with a Promus element DES 2.5 mm 15 mm stent.   He was followed by Dr. Aldona Bar, until his retirement.   Feb 2012 cardiac catheterization for CP:   Moderate lesion (roughly 60-70 percent) in the LAD proximal to the widely patent stent - FFR ratio of 0.82 that is just above the threshold for physiologically significant --> Med Rx.    90% RV marginal branch stenosis.   Med Rx:  Beta blocker attempted, but stopped due to bradycardia & poor exercise tolerance.   Interval History: He is still doing well. He is almost back to his routine level of activity, to be noting that his overall status has gotten worse. He still splits wood, and does gardening. He still notes that he gets a bit short of breath with more vigorous activities, but is able to do routine activities without problems. He doesn't do routine exercise but he gets plenty of exercise by working outside. He denies any chest pain or shortness of breath associated with this activity. He does get a little more tired than he used, but feels better after having kept the 9 pounds off that he lost last visit. He is maintaining his dietary changes, and that his overall medical better. t He is also increase his activity level. He denies any PND, orthopnea or edema. He does note occasional positional dizziness (if he bends down and then quickly stands back up again.  The remainder of cardiac review of systems is as follows: Cardiovascular ROS: no chest  pain or dyspnea on exertion negative for - edema, irregular heartbeat, loss of consciousness, murmur, orthopnea, palpitations, paroxysmal nocturnal dyspnea or rapid heart rate : Syncope/near-syncope - no; TIA/amaurosis fugax - no  Melena - no, hematochezia no; hematuria - no; nosebleeds - no; claudication - no.  He does note easy bruising.   Past Medical History  Diagnosis Date  . Hypertension   . Hyperlipidemia   . Diabetes mellitus without complication   . BPH (benign prostatic hyperplasia)   . CAD S/P percutaneous coronary angioplasty     PCI to LAD: Promus Element 2.5 mm x 50 mm DES; for non-STEMI  . History of: Non-ST elevated myocardial infarction (non-STEMI) June 2011   Prior Cardiac Evaluation and Past Surgical History: Past Surgical History  Procedure Laterality Date  . Cardiac catheterization  12/2009    Proximal LAD stenosis followed by a significant 80-90% distal stenosis  . Coronary angioplasty  12/2009    PTCA to proximal LAD; PCI with Promus Element DES stent  2.5 mm x 15 mm  distalLAD - .for non-ST elevation MI  . Cardiac catheterization  February 2012     90% ostial RV marginal branch; 60-70% proximal LAD with widely patent distal stent. FFR 0.82; medical therapy  . Vasectomy     Allergies  Allergen Reactions  . Statins Other (See Comments)    Myalgias and cramping Both Crestor and Lipitor  .  Tetanus Toxoids     Current Outpatient Prescriptions  Medication Sig Dispense Refill  . aspirin EC 81 MG tablet Take 81 mg by mouth daily.      . cholecalciferol (VITAMIN D) 1000 UNITS tablet Take 2000 mg 2 tablets in the morning      . CINNAMON PO Take 1,000 mg by mouth 2 (two) times daily.      . clopidogrel (PLAVIX) 75 MG tablet Take 1 tablet (75 mg total) by mouth daily.  90 tablet  3  . doxazosin (CARDURA) 4 MG tablet Take 4 mg by mouth daily.       . finasteride (PROSCAR) 5 MG tablet Take 5 mg by mouth daily.      Marland Kitchen lisinopril (PRINIVIL,ZESTRIL) 5 MG tablet Take 1  tablet (5 mg total) by mouth daily.  90 tablet  3  . metFORMIN (GLUCOPHAGE) 500 MG tablet Take 500 mg by mouth 2 (two) times daily with a meal.      . ZETIA 10 MG tablet        No current facility-administered medications for this visit.   History   Social History Narrative   He is a married father of 2 with no grandchildren as of yet. He continued to smoke a pipe several times the course of day. He does have an occasional alcoholic beverage. While not involved a standard exercise routine, he is very active with walking and working in the yard, splitting wood and doing aggressive yard work including Production designer, theatre/television/film.    ROS: A comprehensive Review of Systems - Negative except Pertinent positives in the history of present illness. Otherwise mild arthritic pain. He does not have any more rash on his arms but he does have rashes on his feet occasionally.  PHYSICAL EXAM BP 120/82  Pulse 67  Ht 5' 10.5" (1.791 m)  Wt 195 lb 9.6 oz (88.724 kg)  BMI 27.66 kg/m2 --  General appearance: A&O, cooperative, appears stated age, no distress and healthy-appearing, well-nourished and well-groomed.  Answers questions appropriately. HEENT: [place]/AT, EOMI, MMM, anicteric sclera Neck: no adenopathy, no carotid bruit, no JVD, supple, symmetrical, trachea midline and thyroid not enlarged, symmetric, no tenderness/mass/nodules Lungs: CTAB, normal percussion bilaterally and nonlabored, good air movement. No W./R../R. Heart: RRR, S1, S2 normal, no murmur, click, rub or gallop and normal apical impulse Abdomen: soft, non-tender; bowel sounds normal; no masses,  no organomegaly Extremities: extremities normal, atraumatic, no cyanosis or edema; no significant rash  Pulses: 2+ and symmetric Neurologic: normal strength and tone. Normal symmetric reflexes. Normal coordination and gait  [id number] today: Yes Rate: 67, Rhythm: NSR, Left axis deviation.  Recent Labs: November 2014: TC 177, HDL 50,  LDL 82, TG 224  -- on Zetia only  ASSESSMENT / PLAN:   CAD S/P percutaneous coronary angioplasty and PCI- DES mid LAD in the setting of non-STEMI Stable. Remains on Plavix alonge due to the problems with aspirin to not on a beta blocker due to bradycardia, stable on lisinopril at low dose. Not on statin intolerance. On Zetia  Continues the very active and has maintained his weight loss.  Hypertension Stable on low-dose ACE inhibitor.  Hyperlipidemia with statin intolerance This is been monitored by his PCP. He is only on that yet. He is been intolerant of TriCor and her lipids in the past as well as statins. We're limited to the study and his exercise.  Tobacco dependency I do not waste my time talking to him today. He  got me it before started talking about smoking cessation.  Overweight (BMI 25.0-29.9) Maintaining weight loss. Continue to recommend dietary modification and continued exercise/physical activity.   Orders Placed This Encounter  Procedures  . EKG 12-Lead   Followup: Six-month  DAVID W. Ellyn Hack, M.D., M.S. [facility] [address] [place], [place]  [id number]  [phone] Pager # [phone]

## 2013-08-15 NOTE — Assessment & Plan Note (Signed)
Stable. Remains on Plavix alonge due to the problems with aspirin to not on a beta blocker due to bradycardia, stable on lisinopril at low dose. Not on statin intolerance. On Zetia  Continues the very active and has maintained his weight loss.

## 2013-08-26 ENCOUNTER — Encounter: Payer: Self-pay | Admitting: Emergency Medicine

## 2013-08-26 ENCOUNTER — Ambulatory Visit (INDEPENDENT_AMBULATORY_CARE_PROVIDER_SITE_OTHER): Payer: Medicare Other | Admitting: Emergency Medicine

## 2013-08-26 VITALS — BP 114/68 | HR 72 | Temp 98.2°F | Resp 16 | Ht 71.0 in | Wt 194.0 lb

## 2013-08-26 DIAGNOSIS — L0291 Cutaneous abscess, unspecified: Secondary | ICD-10-CM | POA: Diagnosis not present

## 2013-08-26 DIAGNOSIS — L0390 Cellulitis, unspecified: Secondary | ICD-10-CM

## 2013-08-26 MED ORDER — DOXYCYCLINE HYCLATE 100 MG PO TABS: 100.0000 mg | ORAL_TABLET | Freq: Two times a day (BID) | ORAL | Status: DC

## 2013-08-26 NOTE — Progress Notes (Signed)
   Subjective:    Patient ID: David [name], male    DOB: 1935-12-14, 78 y.o.   MRN: [id number]  HPI Comments: 78 yo male with increased discomfort with knot. He has had knot for over 1 week between scrotum and anus. He notes wife thinks it may have been draining a little this morning with pressure but no relief even with drainage. He notes it is becoming more uncomfortable to sit down due to the location and pain. He denies fever or changes with urine or BM. He notes he has previously had a similar type bol close to this area in the past.    Current Outpatient Prescriptions on File Prior to Visit  Medication Sig Dispense Refill  . aspirin EC 81 MG tablet Take 81 mg by mouth daily.      . cholecalciferol (VITAMIN D) 1000 UNITS tablet Take 2000 mg 2 tablets in the morning      . CINNAMON PO Take 1,000 mg by mouth 2 (two) times daily.      . clopidogrel (PLAVIX) 75 MG tablet Take 1 tablet (75 mg total) by mouth daily.  90 tablet  3  . doxazosin (CARDURA) 4 MG tablet Take 4 mg by mouth daily.       . finasteride (PROSCAR) 5 MG tablet Take 5 mg by mouth daily.      Marland Kitchen lisinopril (PRINIVIL,ZESTRIL) 5 MG tablet Take 1 tablet (5 mg total) by mouth daily.  90 tablet  3  . metFORMIN (GLUCOPHAGE) 500 MG tablet Take 500 mg by mouth. 1 pill at breakfast and 2 pills dinner      . ZETIA 10 MG tablet        No current facility-administered medications on file prior to visit.   ALLERGIES Allergies  Allergen Reactions  . Statins Other (See Comments)    Myalgias and cramping Both Crestor and Lipitor  . Tetanus Toxoids     Past Medical History  Diagnosis Date  . Hypertension   . Hyperlipidemia   . Diabetes mellitus without complication   . BPH (benign prostatic hyperplasia)   . CAD S/P percutaneous coronary angioplasty     PCI to LAD: Promus Element 2.5 mm x 50 mm DES; for non-STEMI  . History of: Non-ST elevated myocardial infarction (non-STEMI) June 2011     Review of Systems  Skin:  Positive for wound.  All other systems reviewed and are negative.   BP 114/68  Pulse 72  Temp(Src) 98.2 F (36.8 C) (Temporal)  Resp 16  Ht 5\' 11"  (1.803 m)  Wt 194 lb (87.998 kg)  BMI 27.07 kg/m2     Objective:   Physical Exam  Nursing note and vitals reviewed. Constitutional: He is oriented to person, place, and time. He appears well-developed and well-nourished.  Musculoskeletal: Normal range of motion.  Neurological: He is alert and oriented to person, place, and time.  Skin: Skin is warm and dry. Rash noted.  Midway between anus and rectum hard non moveable 1 inch mass with mild erythema  Psychiatric: He has a normal mood and affect. Judgment and thought content normal.          Assessment & Plan:   1. probable abscess [place]ern[place]um- Advised warm epsom salt soaks, Doxy 100 AD. F/u Monday for drainage if no change. w/c if SX increase or ER.

## 2013-08-26 NOTE — Patient Instructions (Signed)

## 2013-09-01 NOTE — Progress Notes (Signed)
Subjective:  David [name] is a 78 y.o. male who presents for Medicare Annual Wellness Visit and 3 month follow up for HTN, hyperlipidemia, diabetes, and vitamin D Def.  Date of last medicare wellness visit is unknown.  His blood pressure has been controlled at home, today their BP is BP: 110/60 mmHg He denies chest pain, shortness of breath, dizziness.  His cholesterol is diet controlled. In addition they are on Zetia and denies myalgias. His cholesterol is controlled. The cholesterol last visit was:   Lab Results  Component Value Date   CHOL 177 06/01/2013   HDL 50 06/01/2013   LDLCALC 82 06/01/2013   TRIG 224* 06/01/2013   CHOLHDL 3.5 06/01/2013   He has been working on diet and exercise for diabetes, and denies blurry vision, polydipsia, polyphagia and polyuria. Last A1C in the office was:  Lab Results  Component Value Date   HGBA1C 5.8* 06/01/2013   Patient is on Vitamin D supplement.  He was treated for an abscess last week with Doxycycline.   Names of Other Physician/Practitioners you currently use: 1. [facility] here for primary care 2. Dr. Pandora Leiter, wears glasses, eye doctor, last visit 10/2012 3. Dr. Joannie Springs, [facility], dentist, last visit Dec 2014, goes 2 times a year 4. Dr. Vicente Males, urologist, yearly 5. Dr. Oletta Lamas, Gastroenterologist, PRN/a 5 years 6. Dr. Ellyn Hack, Cardiologist. q 6 months 7. Dr. Vertell Limber, Ortho, Spinal stenosis   Medical Services you may have received from other than [facility] providers in the past year (date may be approximate) N/A  Current Problems (verified) Patient Active Problem List   Diagnosis Date Noted  . Overweight (BMI 25.0-29.9) 02/23/2013    Class: Chronic  . Tobacco dependency 02/22/2013  . Hyperlipidemia with statin intolerance   . Hypertension   . Diabetes mellitus without complication   . CAD S/P percutaneous coronary angioplasty and PCI- DES mid LAD in the setting of non-STEMI 12/31/2009     Immunization History  Administered Date(s) Administered  . Influenza, High Dose Seasonal PF 06/01/2013  . Influenza-Unspecified 05/13/2011    Screening Tests Health Maintenance  Topic Date Due  . Tetanus/tdap  02/06/1957  . Colonoscopy  02/07/1988  . Zostavax  02/06/1998  . Pneumococcal Polysaccharide Vaccine Age 66 And Over  02/07/2003  . Influenza Vaccine  02/18/2014     Preventative care: Last colonoscopy: 04/2009 Dr. Oletta Lamas due this year  Prior vaccinations: TD or Tdap: ALLERGY  Influenza: 2014 Pneumococcal: 2004 Shingles/Zostavax: declines  History reviewed: allergies, current medications, past family history, past medical history, past social history, past surgical history and problem list   Risk Factors: Tobacco History  Smoking status  . Current Every Day Smoker -- 60 years  . Types: Pipe  Smokeless tobacco  . Never Used    Comment: smokes pipe qd   male does smoke.   Are there smokers in your home (other than you)?  No  Alcohol Current alcohol use: social drinker  Caffeine Current caffeine use: coffee 2-3 /day  Exercise Current exercise habits: [place], walks daily  Current exercise: gardening, housecleaning and yard work  Nutrition/Diet Current diet: in general, a "healthy" diet    Cardiac risk factors: advanced age (older than 56 for men, 40 for women), diabetes mellitus, dyslipidemia, family history of premature cardiovascular disease, hypertension, male gender, obesity (BMI >= 30 kg/m2) and smoking/ tobacco exposure.  Depression Screen Nurse depression screen reviewed.  (Note: if answer to either of the following is "Yes", a more  complete depression screening is indicated)   Q1: Over the past two weeks, have you felt down, depressed or hopeless? No  Q2: Over the past two weeks, have you felt little interest or pleasure in doing things? No  Have you lost interest or pleasure in daily life? No  Do you often feel hopeless? No  Do  you cry easily over simple problems? No  Activities of Daily Living Nurse ADLs screen reviewed.  In your present state of health, do you have any difficulty performing the following activities?:  Driving? No Managing money?  No Feeding yourself? No Getting from bed to chair? No Climbing a flight of stairs? No Preparing food and eating?: No Bathing or showering? No Getting dressed: No Getting to the toilet? No Using the toilet:No Moving around from place to place: No In the past year have you fallen or had a near fall?:No   Are you sexually active?  No  Do you have more than one partner?  No  Vision Difficulties: No  Hearing Difficulties: Yes Do you often ask people to speak up or repeat themselves? Yes Do you experience ringing or noises in your ears? No Do you have difficulty understanding soft or whispered voices? No  Cognition  Do you feel that you have a problem with memory? No  Do you often misplace items? No  Do you feel safe at home?  Yes  Advanced directives Does patient have a [place]? Yes Does patient have a Living Will? Yes   Objective:     Vision and hearing screens reviewed.   Blood pressure 110/60, pulse 64, temperature 97.9 F (36.6 C), resp. rate 16, height 5\' 11"  (1.803 m), weight 193 lb (87.544 kg). Body mass index is 26.93 kg/(m^2).  General appearance: alert, no distress, WD/WN, male Cognitive Testing  Alert? Yes  Normal Appearance?Yes  Oriented to person? Yes  Place? Yes   Time? Yes  Recall of three objects?  Yes  Can perform simple calculations? Yes  Displays appropriate judgment?Yes  Can read the correct time from a watch face?Yes  HEENT: normocephalic, sclerae anicteric, TMs pearly, nares patent, no discharge or erythema, pharynx normal Oral cavity: MMM, no lesions Neck: supple, no lymphadenopathy, no thyromegaly, no masses Heart: RRR, normal S1, S2, no murmurs Lungs: CTA bilaterally, no wheezes, rhonchi, or  rales Abdomen: +bs, soft, obese, non tender, non distended, no masses, no hepatomegaly, no splenomegaly Musculoskeletal: nontender, no swelling, no obvious deformity Extremities: no edema, no cyanosis, no clubbing Pulses: 2+ symmetric, upper and lower extremities, normal cap refill Neurological: alert, oriented x 3, CN2-12 intact, strength normal upper extremities and lower extremities, sensation normal throughout, DTRs 2+ throughout, no cerebellar signs, gait normal Psychiatric: normal affect, behavior normal, pleasant   Assessment:   1. Hypertension- at goal - CBC with Differential - BASIC METABOLIC PANEL WITH GFR - Hepatic function panel - TSH  2. CAD S/P percutaneous coronary angioplasty and PCI- DES mid LAD in the setting of non-STEMI - no chest pain -follows up with Cardiologist q 6 months.   3. Diabetes mellitus without complication- at goal - Hemoglobin A1c - Insulin, fasting  4. Hyperlipidemia with statin intolerance- at goal - Lipid panel  5. Tobacco dependency- smoking cessation discussed  6. Overweight (BMI 25.0-29.9)- weight loss advised  7. Unspecified vitamin D deficiency  8. Encounter for long-term (current) use of other medications - Magnesium   Plan:   During the course of the visit the patient was educated and  counseled about appropriate screening and preventive services including:    Colorectal cancer screening  Diabetes screening  Glaucoma screening  Nutrition counseling   Smoking cessation counseling  Advanced directives: power of attorney for healthcare on file  Screening recommendations, referrals:  Vaccinations: Tdap vaccine ALLERGY Influenza vaccine Due 2015 Pneumococcal vaccine UPTODATE Shingles vaccine DECLINES- discussed increased risk of with increased age, and associated pain. Understands.  Hep B vaccine UPTODATE  Nutrition assessed and recommended no Colonoscopy due this year Recommended yearly ophthalmology/optometry  visit for glaucoma screening and checkup Recommended yearly dental visit for hygiene and checkup Advanced directives -done  Conditions/risks identified: BMI- discussed weight loss, diet Cardiac risk factors- history of CAD- increase walking, 150 mins a week Smoking cessation- discussed patient is not ready at this time to quit, information given.  Colonoscopy- needs this year in Oct PreDM- discussed diet/exercise  Medicare Attestation I have personally reviewed: The patient's medical and social history Their use of alcohol, tobacco or illicit drugs Their current medications and supplements The patient's functional ability including ADLs,fall risks, home safety risks, cognitive, and hearing and visual impairment Diet and physical activities Evidence for depression or mood disorders  The patient's weight, height, BMI, and visual acuity have been recorded in the chart.  I have made referrals, counseling, and provided education to the patient based on review of the above and I have provided the patient with a written personalized care plan for preventive services.     Vicie Mutters, PA-C   09/02/2013

## 2013-09-02 ENCOUNTER — Ambulatory Visit (INDEPENDENT_AMBULATORY_CARE_PROVIDER_SITE_OTHER): Payer: Medicare Other | Admitting: Physician Assistant

## 2013-09-02 ENCOUNTER — Encounter: Payer: Self-pay | Admitting: Physician Assistant

## 2013-09-02 VITALS — BP 110/60 | HR 64 | Temp 97.9°F | Resp 16 | Ht 71.0 in | Wt 193.0 lb

## 2013-09-02 DIAGNOSIS — I2510 Atherosclerotic heart disease of native coronary artery without angina pectoris: Secondary | ICD-10-CM

## 2013-09-02 DIAGNOSIS — E119 Type 2 diabetes mellitus without complications: Secondary | ICD-10-CM | POA: Diagnosis not present

## 2013-09-02 DIAGNOSIS — E559 Vitamin D deficiency, unspecified: Secondary | ICD-10-CM

## 2013-09-02 DIAGNOSIS — F17200 Nicotine dependence, unspecified, uncomplicated: Secondary | ICD-10-CM

## 2013-09-02 DIAGNOSIS — Z79899 Other long term (current) drug therapy: Secondary | ICD-10-CM | POA: Diagnosis not present

## 2013-09-02 DIAGNOSIS — Z0000 Encounter for general adult medical examination without abnormal findings: Secondary | ICD-10-CM

## 2013-09-02 DIAGNOSIS — E785 Hyperlipidemia, unspecified: Secondary | ICD-10-CM

## 2013-09-02 DIAGNOSIS — I10 Essential (primary) hypertension: Secondary | ICD-10-CM

## 2013-09-02 DIAGNOSIS — Z9861 Coronary angioplasty status: Secondary | ICD-10-CM

## 2013-09-02 DIAGNOSIS — E663 Overweight: Secondary | ICD-10-CM

## 2013-09-02 LAB — CBC WITH DIFFERENTIAL/PLATELET
BASOS PCT: 1 % (ref 0–1)
Basophils Absolute: 0.1 10*3/uL (ref 0.0–0.1)
Eosinophils Absolute: 0.4 10*3/uL (ref 0.0–0.7)
Eosinophils Relative: 5 % (ref 0–5)
HEMATOCRIT: 45.9 % (ref 39.0–52.0)
HEMOGLOBIN: 15.8 g/dL (ref 13.0–17.0)
LYMPHS ABS: 1.5 10*3/uL (ref 0.7–4.0)
Lymphocytes Relative: 19 % (ref 12–46)
MCH: 31.9 pg (ref 26.0–34.0)
MCHC: 34.4 g/dL (ref 30.0–36.0)
MCV: 92.5 fL (ref 78.0–100.0)
MONO ABS: 0.7 10*3/uL (ref 0.1–1.0)
Monocytes Relative: 9 % (ref 3–12)
NEUTROS ABS: 5.4 10*3/uL (ref 1.7–7.7)
Neutrophils Relative %: 66 % (ref 43–77)
Platelets: 252 10*3/uL (ref 150–400)
RBC: 4.96 MIL/uL (ref 4.22–5.81)
RDW: 13.2 % (ref 11.5–15.5)
WBC: 8.1 10*3/uL (ref 4.0–10.5)

## 2013-09-02 LAB — HEMOGLOBIN A1C
HEMOGLOBIN A1C: 5.7 % — AB (ref <5.7)
Mean Plasma Glucose: 117 mg/dL — ABNORMAL HIGH (ref <117)

## 2013-09-02 MED ORDER — CLOTRIMAZOLE-BETAMETHASONE 1-0.05 % EX CREA: 1.0000 "application " | TOPICAL_CREAM | Freq: Two times a day (BID) | CUTANEOUS | Status: DC

## 2013-09-02 NOTE — Patient Instructions (Signed)
Preventative Care for Adults, Male       REGULAR HEALTH EXAMS:  A routine yearly physical is a good way to check in with your primary care provider about your health and preventive screening. It is also an opportunity to share updates about your health and any concerns you have, and receive a thorough all-over exam.   Most health insurance companies pay for at least some preventative services.  Check with your health plan for specific coverages.  WHAT PREVENTATIVE SERVICES DO MEN NEED?  Adult men should have their weight and blood pressure checked regularly.   Men age 35 and older should have their cholesterol levels checked regularly.  Beginning at age 50 and continuing to age 75, men should be screened for colorectal cancer.  Certain people should may need continued testing until age 85.  Other cancer screening may include exams for testicular and prostate cancer.  Updating vaccinations is part of preventative care.  Vaccinations help protect against diseases such as the flu.  Lab tests are generally done as part of preventative care to screen for anemia and blood disorders, to screen for problems with the kidneys and liver, to screen for bladder problems, to check blood sugar, and to check your cholesterol level.  Preventative services generally include counseling about diet, exercise, avoiding tobacco, drugs, excessive alcohol consumption, and sexually transmitted infections.    GENERAL RECOMMENDATIONS FOR GOOD HEALTH:  Healthy diet:  Eat a variety of foods, including fruit, vegetables, animal or vegetable protein, such as meat, fish, chicken, and eggs, or beans, lentils, tofu, and grains, such as rice.  Drink plenty of water daily.  Decrease saturated fat in the diet, avoid lots of red meat, processed foods, sweets, fast foods, and fried foods.  Exercise:  Aerobic exercise helps maintain good heart health. At least 30-40 minutes of moderate-intensity exercise is recommended.  For example, a brisk walk that increases your heart rate and breathing. This should be done on most days of the week.   Find a type of exercise or a variety of exercises that you enjoy so that it becomes a part of your daily life.  Examples are running, walking, swimming, water aerobics, and biking.  For motivation and support, explore group exercise such as aerobic class, spin class, Zumba, Yoga,or  martial arts, etc.    Set exercise goals for yourself, such as a certain weight goal, walk or run in a race such as a 5k walk/run.  Speak to your primary care provider about exercise goals.  Disease prevention:  If you smoke or chew tobacco, find out from your caregiver how to quit. It can literally save your life, no matter how long you have been a tobacco user. If you do not use tobacco, never begin.   Maintain a healthy diet and normal weight. Increased weight leads to problems with blood pressure and diabetes.   The Body Mass Index or BMI is a way of measuring how much of your body is fat. Having a BMI above 27 increases the risk of heart disease, diabetes, hypertension, stroke and other problems related to obesity. Your caregiver can help determine your BMI and based on it develop an exercise and dietary program to help you achieve or maintain this important measurement at a healthful level.  High blood pressure causes heart and blood vessel problems.  Persistent high blood pressure should be treated with medicine if weight loss and exercise do not work.   Fat and cholesterol leaves deposits in your arteries   that can block them. This causes heart disease and vessel disease elsewhere in your body.  If your cholesterol is found to be high, or if you have heart disease or certain other medical conditions, then you may need to have your cholesterol monitored frequently and be treated with medication.   Ask if you should have a stress test if your history suggests this. A stress test is a test done on  a treadmill that looks for heart disease. This test can find disease prior to there being a problem.  Avoid drinking alcohol in excess (more than two drinks per day).  Avoid use of street drugs. Do not share needles with anyone. Ask for professional help if you need assistance or instructions on stopping the use of alcohol, cigarettes, and/or drugs.  Brush your teeth twice a day with fluoride toothpaste, and floss once a day. Good oral hygiene prevents tooth decay and gum disease. The problems can be painful, unattractive, and can cause other health problems. Visit your dentist for a routine oral and dental check up and preventive care every 6-12 months.   Look at your skin regularly.  Use a mirror to look at your back. Notify your caregivers of changes in moles, especially if there are changes in shapes, colors, a size larger than a pencil eraser, an irregular border, or development of new moles.  Safety:  Use seatbelts 100% of the time, whether driving or as a passenger.  Use safety devices such as hearing protection if you work in environments with loud noise or significant background noise.  Use safety glasses when doing any work that could send debris in to the eyes.  Use a helmet if you ride a bike or motorcycle.  Use appropriate safety gear for contact sports.  Talk to your caregiver about gun safety.  Use sunscreen with a SPF (or skin protection factor) of 15 or greater.  Lighter skinned people are at a greater risk of skin cancer. Don't forget to also wear sunglasses in order to protect your eyes from too much damaging sunlight. Damaging sunlight can accelerate cataract formation.   Practice safe sex. Use condoms. Condoms are used for birth control and to help reduce the spread of sexually transmitted infections (or STIs).  Some of the STIs are gonorrhea (the clap), chlamydia, syphilis, trichomonas, herpes, HPV (human papilloma virus) and HIV (human immunodeficiency virus) which causes AIDS.  The herpes, HIV and HPV are viral illnesses that have no cure. These can result in disability, cancer and death.   Keep carbon monoxide and smoke detectors in your home functioning at all times. Change the batteries every 6 months or use a model that plugs into the wall.   Vaccinations:  Stay up to date with your tetanus shots and other required immunizations. You should have a booster for tetanus every 10 years. Be sure to get your flu shot every year, since 5%-20% of the [place] population comes down with the flu. The flu vaccine changes each year, so being vaccinated once is not enough. Get your shot in the fall, before the flu season peaks.   Other vaccines to consider:  Pneumococcal vaccine to protect against certain types of pneumonia.  This is normally recommended for adults age 65 or older.  However, adults younger than 76 years old with certain underlying conditions such as diabetes, heart or lung disease should also receive the vaccine.  Shingles vaccine to protect against Varicella Zoster if you are older than age 60, or younger   than 76 years old with certain underlying illness.  Hepatitis A vaccine to protect against a form of infection of the liver by a virus acquired from food.  Hepatitis B vaccine to protect against a form of infection of the liver by a virus acquired from blood or body fluids, particularly if you work in health care.  If you plan to travel internationally, check with your local health department for specific vaccination recommendations.  Cancer Screening:  Most routine colon cancer screening begins at the age of 50. On a yearly basis, doctors may provide special easy to use take-home tests to check for hidden blood in the stool. Sigmoidoscopy or colonoscopy can detect the earliest forms of colon cancer and is life saving. These tests use a small camera at the end of a tube to directly examine the colon. Speak to your caregiver about this at age 50, when routine  screening begins (and is repeated every 5 years unless early forms of pre-cancerous polyps or small growths are found).   At the age of 50 men usually start screening for prostate cancer every year. Screening may begin at a younger age for those with higher risk. Those at higher risk include African-Americans or having a family history of prostate cancer. There are two types of tests for prostate cancer:   Prostate-specific antigen (PSA) testing. Recent studies raise questions about prostate cancer using PSA and you should discuss this with your caregiver.   Digital rectal exam (in which your doctor's lubricated and gloved finger feels for enlargement of the prostate through the anus).   Screening for testicular cancer.  Do a monthly exam of your testicles. Gently roll each testicle between your thumb and fingers, feeling for any abnormal lumps. The best time to do this is after a hot shower or bath when the tissues are looser. Notify your caregivers of any lumps, tenderness or changes in size or shape immediately.     

## 2013-09-03 LAB — BASIC METABOLIC PANEL WITH GFR
BUN: 18 mg/dL (ref 6–23)
CO2: 27 mEq/L (ref 19–32)
Calcium: 9.7 mg/dL (ref 8.4–10.5)
Chloride: 103 mEq/L (ref 96–112)
Creat: 0.93 mg/dL (ref 0.50–1.35)
GFR, Est Non African American: 80 mL/min
GLUCOSE: 106 mg/dL — AB (ref 70–99)
Potassium: 4.3 mEq/L (ref 3.5–5.3)
Sodium: 139 mEq/L (ref 135–145)

## 2013-09-03 LAB — HEPATIC FUNCTION PANEL
ALK PHOS: 60 U/L (ref 39–117)
ALT: 16 U/L (ref 0–53)
AST: 15 U/L (ref 0–37)
Albumin: 4.3 g/dL (ref 3.5–5.2)
BILIRUBIN DIRECT: 0.1 mg/dL (ref 0.0–0.3)
Indirect Bilirubin: 0.3 mg/dL (ref 0.2–1.2)
TOTAL PROTEIN: 6.7 g/dL (ref 6.0–8.3)
Total Bilirubin: 0.4 mg/dL (ref 0.2–1.2)

## 2013-09-03 LAB — LIPID PANEL
Cholesterol: 164 mg/dL (ref 0–200)
HDL: 45 mg/dL (ref >39)
LDL CALC: 91 mg/dL (ref 0–99)
TRIGLYCERIDES: 142 mg/dL (ref <150)
Total CHOL/HDL Ratio: 3.6 Ratio
VLDL: 28 mg/dL (ref 0–40)

## 2013-09-03 LAB — MAGNESIUM: Magnesium: 2 mg/dL (ref 1.5–2.5)

## 2013-09-03 LAB — TSH: TSH: 2.325 u[IU]/mL (ref 0.350–4.500)

## 2013-09-03 LAB — INSULIN, FASTING: INSULIN FASTING, SERUM: 42 u[IU]/mL — AB (ref 3–28)

## 2013-09-05 ENCOUNTER — Other Ambulatory Visit: Payer: Self-pay | Admitting: Internal Medicine

## 2013-11-14 ENCOUNTER — Other Ambulatory Visit: Payer: Self-pay | Admitting: Internal Medicine

## 2013-11-14 DIAGNOSIS — H40019 Open angle with borderline findings, low risk, unspecified eye: Secondary | ICD-10-CM | POA: Diagnosis not present

## 2013-11-14 DIAGNOSIS — E119 Type 2 diabetes mellitus without complications: Secondary | ICD-10-CM | POA: Diagnosis not present

## 2013-11-14 DIAGNOSIS — H2510 Age-related nuclear cataract, unspecified eye: Secondary | ICD-10-CM | POA: Diagnosis not present

## 2013-11-14 DIAGNOSIS — H524 Presbyopia: Secondary | ICD-10-CM | POA: Diagnosis not present

## 2013-11-14 DIAGNOSIS — H40059 Ocular hypertension, unspecified eye: Secondary | ICD-10-CM | POA: Diagnosis not present

## 2013-11-23 ENCOUNTER — Encounter: Payer: Self-pay | Admitting: *Deleted

## 2013-12-01 ENCOUNTER — Ambulatory Visit: Payer: Self-pay | Admitting: Internal Medicine

## 2013-12-25 DIAGNOSIS — E559 Vitamin D deficiency, unspecified: Secondary | ICD-10-CM | POA: Insufficient documentation

## 2013-12-25 DIAGNOSIS — Z79899 Other long term (current) drug therapy: Secondary | ICD-10-CM | POA: Insufficient documentation

## 2013-12-25 NOTE — Progress Notes (Signed)
Patient ID: David [name], male   DOB: May 05, 1936, 78 y.o.   MRN: [id number]    This very nice 78 y.o.MWM presents for 3 month follow up with Hypertension, Hyperlipidemia, T2 NIDDM and Vitamin D Deficiency.    HTN predates since 2000. BP has been controlled at home. Today's BP: 116/52 mmHg. In 2010 & 2011 he had PTCA/DES and PTCA for ACS and NSTMI and in 2012 Heart cath showed patent stents. Patient has done well since & denies any cardiac type chest pain, palpitations, dyspnea/orthopnea/PND, dizziness, claudication, or dependent edema.   Hyperlipidemia is controlled with diet & meds. Last lipids in Feb as below were at goal. Patient denies myalgias or other med SE's.  Lab Results  Component Value Date   CHOL 164 09/02/2013   HDL 45 09/02/2013   LDLCALC 91 09/02/2013   TRIG 142 09/02/2013   CHOLHDL 3.6 09/02/2013    Also, the patient has history of T2NIDDM with last A1c of 5.7% Feb 2015 and Stage 2 CKD with GFR 34ml/min Feb 2015. Patient denies any symptoms of reactive hypoglycemia, diabetic polys, paresthesias or visual blurring.    Further, Patient has history of Vitamin D Deficiency  Of 22 in 2008 and last vitamin D was 59 in Nov 2014. Patient supplements vitamin D without any suspected side-effects.    Medication List   aspirin EC 81 MG tablet  Take 81 mg by mouth daily.     cholecalciferol 1000 UNITS tablet  Commonly known as:  VITAMIN D  Take 1000 units 4 per day     CINNAMON PO  Take 1,000 mg by mouth 2 (two) times daily.     clopidogrel 75 MG tablet  Commonly known as:  PLAVIX  Take 1 tablet (75 mg total) by mouth daily.     doxazosin 4 MG tablet  Commonly known as:  CARDURA  TAKE 1 TABLET BY MOUTH AT BEDTIME     finasteride 5 MG tablet  Commonly known as:  PROSCAR  Take 5 mg by mouth daily.     lisinopril 5 MG tablet  Commonly known as:  PRINIVIL,ZESTRIL  Take 1 tablet (5 mg total) by mouth daily.     metFORMIN 500 MG tablet  Commonly known as:  GLUCOPHAGE  Take  500 mg by mouth. 1 pill at breakfast and 2 pills dinner     ZETIA 10 MG tablet  Generic drug:  ezetimibe  TAKE 1 TABLET EVERY DAY FOR CHOLESTEROL     Allergies  Allergen Reactions  . Statins Other (See Comments)    Myalgias and cramping Both Crestor and Lipitor  . Tetanus Toxoids    PMHx:   Past Medical History  Diagnosis Date  . Hypertension   . Hyperlipidemia   . Diabetes mellitus without complication   . BPH (benign prostatic hyperplasia)   . CAD S/P percutaneous coronary angioplasty     PCI to LAD: Promus Element 2.5 mm x 50 mm DES; for non-STEMI  . History of: Non-ST elevated myocardial infarction (non-STEMI) June 2011   FHx:    Reviewed / unchanged  SHx:    Reviewed / unchanged   Systems Review: Constitutional: Denies fever, chills, wt changes, headaches, insomnia, fatigue, night sweats, change in appetite. Eyes: Denies redness, blurred vision, diplopia, discharge, itchy, watery eyes.  ENT: Denies discharge, congestion, post nasal drip, epistaxis, sore throat, earache, hearing loss, dental pain, tinnitus, vertigo, sinus pain, snoring.  CV: Denies chest pain, palpitations, irregular heartbeat, syncope, dyspnea, diaphoresis, orthopnea, PND, claudication  or edema. Respiratory: denies cough, dyspnea, DOE, pleurisy, hoarseness, laryngitis, wheezing.  Gastrointestinal: Denies dysphagia, odynophagia, heartburn, reflux, water brash, abdominal pain or cramps, nausea, vomiting, bloating, diarrhea, constipation, hematemesis, melena, hematochezia  or hemorrhoids. Genitourinary: Denies dysuria, frequency, urgency, nocturia, hesitancy, discharge, hematuria or flank pain. Musculoskeletal: Denies arthralgias, myalgias, stiffness, jt. swelling, pain, limping or strain/sprain.  Skin: Denies pruritus, rash, hives, warts, acne, eczema or change in skin lesion(s). Neuro: No weakness, tremor, incoordination, spasms, paresthesia or pain. Psychiatric: Denies confusion, memory loss or sensory  loss. Endo: Denies change in weight, skin or hair change.  Heme/Lymph: No excessive bleeding, bruising or enlarged lymph nodes.  Exam:  BP 116/52  Pulse 64  Temp 97.7 F   Resp 18  Ht 5\' 11"    Wt 196 lb 6.4 oz   BMI 27.40 kg/m2  Appears well nourished - in no distress. Eyes: PERRLA, EOMs, conjunctiva no swelling or erythema. Sinuses: No frontal/maxillary tenderness ENT/Mouth: EAC's clear, TM's nl w/o erythema, bulging. Nares clear w/o erythema, swelling, exudates. Oropharynx clear without erythema or exudates. Oral hygiene is good. Tongue normal, non obstructing. Hearing intact.  Neck: Supple. Thyroid nl. Car 2+/2+ without bruits, nodes or JVD. Chest: Respirations nl with BS clear & equal w/o rales, rhonchi, wheezing or stridor.  Cor: Heart sounds normal w/ regular rate and rhythm without sig. murmurs, gallops, clicks, or rubs. Peripheral pulses normal and equal  without edema.  Abdomen: Soft & bowel sounds normal. Non-tender w/o guarding, rebound, hernias, masses, or organomegaly.  Lymphatics: Unremarkable.  Musculoskeletal: Full ROM all peripheral extremities, joint stability, 5/5 strength, and normal gait.  Skin: Warm, dry without exposed rashes, lesions or ecchymosis apparent.  Neuro: Cranial nerves intact, reflexes equal bilaterally. Sensory-motor testing grossly intact. Tendon reflexes grossly intact.  Pysch: Alert & oriented x 3. Insight and judgement nl & appropriate. No ideations.  Assessment and Plan:  1. Hypertension - Continue monitor blood pressure at home. Continue diet/meds same.  2. Hyperlipidemia - Continue diet/meds, exercise,& lifestyle modifications. Continue monitor periodic cholesterol/liver & renal functions   3. T2 NIDDM with Stage 2 CKD - continue recommend prudent low glycemic diet, weight control, regular exercise, diabetic monitoring and periodic eye exams.  4. Vitamin D Deficiency - Continue supplementation.  Recommended regular exercise, BP  monitoring, weight control, and discussed med and SE's. Recommended labs to assess and monitor clinical status. Further disposition pending results of labs.

## 2013-12-25 NOTE — Patient Instructions (Signed)

## 2013-12-26 ENCOUNTER — Ambulatory Visit (INDEPENDENT_AMBULATORY_CARE_PROVIDER_SITE_OTHER): Payer: Medicare Other | Admitting: Internal Medicine

## 2013-12-26 ENCOUNTER — Encounter: Payer: Self-pay | Admitting: Internal Medicine

## 2013-12-26 VITALS — BP 116/52 | HR 64 | Temp 97.7°F | Resp 18 | Ht 71.0 in | Wt 196.4 lb

## 2013-12-26 DIAGNOSIS — I10 Essential (primary) hypertension: Secondary | ICD-10-CM | POA: Diagnosis not present

## 2013-12-26 DIAGNOSIS — E785 Hyperlipidemia, unspecified: Secondary | ICD-10-CM

## 2013-12-26 DIAGNOSIS — Z79899 Other long term (current) drug therapy: Secondary | ICD-10-CM | POA: Diagnosis not present

## 2013-12-26 DIAGNOSIS — E559 Vitamin D deficiency, unspecified: Secondary | ICD-10-CM | POA: Diagnosis not present

## 2013-12-26 DIAGNOSIS — E1129 Type 2 diabetes mellitus with other diabetic kidney complication: Secondary | ICD-10-CM

## 2013-12-26 LAB — CBC WITH DIFFERENTIAL/PLATELET
Basophils Absolute: 0 10*3/uL (ref 0.0–0.1)
Basophils Relative: 0 % (ref 0–1)
EOS ABS: 0.4 10*3/uL (ref 0.0–0.7)
Eosinophils Relative: 5 % (ref 0–5)
HCT: 43.2 % (ref 39.0–52.0)
HEMOGLOBIN: 14.7 g/dL (ref 13.0–17.0)
LYMPHS ABS: 1.3 10*3/uL (ref 0.7–4.0)
Lymphocytes Relative: 17 % (ref 12–46)
MCH: 31.4 pg (ref 26.0–34.0)
MCHC: 34 g/dL (ref 30.0–36.0)
MCV: 92.3 fL (ref 78.0–100.0)
MONO ABS: 0.8 10*3/uL (ref 0.1–1.0)
MONOS PCT: 11 % (ref 3–12)
NEUTROS PCT: 67 % (ref 43–77)
Neutro Abs: 5.1 10*3/uL (ref 1.7–7.7)
Platelets: 208 10*3/uL (ref 150–400)
RBC: 4.68 MIL/uL (ref 4.22–5.81)
RDW: 13.3 % (ref 11.5–15.5)
WBC: 7.6 10*3/uL (ref 4.0–10.5)

## 2013-12-26 LAB — HEMOGLOBIN A1C
HEMOGLOBIN A1C: 6 % — AB (ref <5.7)
Mean Plasma Glucose: 126 mg/dL — ABNORMAL HIGH (ref <117)

## 2013-12-27 LAB — HEPATIC FUNCTION PANEL
ALT: 18 U/L (ref 0–53)
AST: 18 U/L (ref 0–37)
Albumin: 3.8 g/dL (ref 3.5–5.2)
Alkaline Phosphatase: 60 U/L (ref 39–117)
BILIRUBIN INDIRECT: 0.4 mg/dL (ref 0.2–1.2)
Bilirubin, Direct: 0.1 mg/dL (ref 0.0–0.3)
Total Bilirubin: 0.5 mg/dL (ref 0.2–1.2)
Total Protein: 6.1 g/dL (ref 6.0–8.3)

## 2013-12-27 LAB — LIPID PANEL
Cholesterol: 156 mg/dL (ref 0–200)
HDL: 49 mg/dL (ref >39)
LDL CALC: 88 mg/dL (ref 0–99)
Total CHOL/HDL Ratio: 3.2 Ratio
Triglycerides: 93 mg/dL (ref <150)
VLDL: 19 mg/dL (ref 0–40)

## 2013-12-27 LAB — BASIC METABOLIC PANEL WITH GFR
BUN: 17 mg/dL (ref 6–23)
CO2: 27 meq/L (ref 19–32)
Calcium: 9.3 mg/dL (ref 8.4–10.5)
Chloride: 105 mEq/L (ref 96–112)
Creat: 0.89 mg/dL (ref 0.50–1.35)
GFR, Est African American: >89 mL/min
GFR, Est Non African American: 84 mL/min
GLUCOSE: 105 mg/dL — AB (ref 70–99)
POTASSIUM: 4.2 meq/L (ref 3.5–5.3)
SODIUM: 138 meq/L (ref 135–145)

## 2013-12-27 LAB — VITAMIN D 25 HYDROXY (VIT D DEFICIENCY, FRACTURES): Vit D, 25-Hydroxy: 78 ng/mL (ref 30–89)

## 2013-12-27 LAB — TSH: TSH: 2.022 u[IU]/mL (ref 0.350–4.500)

## 2013-12-27 LAB — MAGNESIUM: Magnesium: 1.9 mg/dL (ref 1.5–2.5)

## 2013-12-27 LAB — INSULIN, FASTING: Insulin fasting, serum: 52 u[IU]/mL — ABNORMAL HIGH (ref 3–28)

## 2014-01-11 DIAGNOSIS — H40019 Open angle with borderline findings, low risk, unspecified eye: Secondary | ICD-10-CM | POA: Diagnosis not present

## 2014-01-11 DIAGNOSIS — H40059 Ocular hypertension, unspecified eye: Secondary | ICD-10-CM | POA: Diagnosis not present

## 2014-02-08 DIAGNOSIS — R972 Elevated prostate specific antigen [PSA]: Secondary | ICD-10-CM | POA: Diagnosis not present

## 2014-02-08 DIAGNOSIS — N401 Enlarged prostate with lower urinary tract symptoms: Secondary | ICD-10-CM | POA: Diagnosis not present

## 2014-02-08 DIAGNOSIS — N139 Obstructive and reflux uropathy, unspecified: Secondary | ICD-10-CM | POA: Diagnosis not present

## 2014-02-08 DIAGNOSIS — N529 Male erectile dysfunction, unspecified: Secondary | ICD-10-CM | POA: Diagnosis not present

## 2014-02-13 ENCOUNTER — Ambulatory Visit (INDEPENDENT_AMBULATORY_CARE_PROVIDER_SITE_OTHER): Payer: Medicare Other | Admitting: Cardiology

## 2014-02-13 ENCOUNTER — Encounter: Payer: Self-pay | Admitting: Cardiology

## 2014-02-13 VITALS — BP 102/70 | HR 70 | Ht 70.5 in | Wt 192.7 lb

## 2014-02-13 DIAGNOSIS — I2510 Atherosclerotic heart disease of native coronary artery without angina pectoris: Secondary | ICD-10-CM | POA: Diagnosis not present

## 2014-02-13 DIAGNOSIS — I214 Non-ST elevation (NSTEMI) myocardial infarction: Secondary | ICD-10-CM

## 2014-02-13 DIAGNOSIS — Z9861 Coronary angioplasty status: Secondary | ICD-10-CM

## 2014-02-13 DIAGNOSIS — F17200 Nicotine dependence, unspecified, uncomplicated: Secondary | ICD-10-CM

## 2014-02-13 DIAGNOSIS — E1129 Type 2 diabetes mellitus with other diabetic kidney complication: Secondary | ICD-10-CM

## 2014-02-13 DIAGNOSIS — I10 Essential (primary) hypertension: Secondary | ICD-10-CM | POA: Diagnosis not present

## 2014-02-13 DIAGNOSIS — E785 Hyperlipidemia, unspecified: Secondary | ICD-10-CM

## 2014-02-13 MED ORDER — ASPIRIN EC 81 MG PO TBEC: 81.0000 mg | DELAYED_RELEASE_TABLET | ORAL | Status: DC

## 2014-02-13 NOTE — Patient Instructions (Signed)
MAY TAKE ASPIRIN EVERY OTHER DAY, IF BRUISE YOU MAY HOLD ASPIRIN FOR FEW DAYS.  Your physician wants you to follow-up in 6 MONTH  Dr Ellyn Hack.  You will receive a reminder letter in the mail two months in advance. If you don't receive a letter, please call our office to schedule the follow-up appointment.

## 2014-02-14 ENCOUNTER — Encounter: Payer: Self-pay | Admitting: *Deleted

## 2014-02-16 ENCOUNTER — Other Ambulatory Visit: Payer: Self-pay | Admitting: *Deleted

## 2014-02-16 DIAGNOSIS — I2510 Atherosclerotic heart disease of native coronary artery without angina pectoris: Secondary | ICD-10-CM

## 2014-02-16 DIAGNOSIS — Z9861 Coronary angioplasty status: Principal | ICD-10-CM

## 2014-02-16 MED ORDER — CLOPIDOGREL BISULFATE 75 MG PO TABS: 75.0000 mg | ORAL_TABLET | Freq: Every day | ORAL | Status: DC

## 2014-02-16 NOTE — Telephone Encounter (Signed)
Rx was sent to pharmacy electronically. 

## 2014-02-17 ENCOUNTER — Encounter: Payer: Self-pay | Admitting: Cardiology

## 2014-02-17 NOTE — Assessment & Plan Note (Signed)
No recurrent anginal symptoms to suggest nonSTEMI/ACS at least since his catheterization in 2012. He remains active with no symptoms of exertional angina. No heart failure symptoms either

## 2014-02-17 NOTE — Assessment & Plan Note (Addendum)
Stable on aspirin plus Plavix. He does have bruising and I think it's okay for him to take his aspirin on a twice a day basis and potentially quit the aspirin altogether. He is on ACE inhibitor but not on a beta blocker, partly because of his borderline hypotension but also because of fatigue on beta blocker in the past. Presence of possible SA block on EKG today and would strength in the case for not using beta blocker.

## 2014-02-17 NOTE — Assessment & Plan Note (Signed)
He is not interested in stopping his pipe. I tried to counsel him several sessions ago, and he simply dismissed the topic.

## 2014-02-17 NOTE — Progress Notes (Signed)
Patient ID: David [name], male   DOB: 01/15/1936, 78 y.o.   MRN: [id number] PCP: Alesia Richards, MD  Clinic Note: Chief Complaint  Patient presents with  . 6 month visit    no chest pain , no sob , no edema   HPI: David [name] is a 78 y.o. male with a PMH below who presents today for followup of his coronary artery disease.  As you recall, he is a pleasant retired Programme researcher, broadcasting/film/video from [employer] s/p NSTEMI in June of 2011 & moderate residual CAD.   Interval History: He is still doing well. Just does not do a "whole lot of walking.  In the fall & winter, he still splits wood, and in the spring & summer does gardening. He still notes that he gets a bit short of breath with more vigorous activities, but is able to do routine activities without problems. He doesn't do routine exercise but he gets plenty of exercise by working outside. He denies any chest pain or shortness of breath associated with this activity. He has done well to keep the weight off.  He denies any PND, orthopnea or edema. He does note occasional positional dizziness (if he bends down and then quickly stands back up again). He continues to smoke despite on a daily basis, and is not willing to give up this 1 vice.  The remainder of cardiac review of systems is as follows: Cardiovascular ROS: no chest pain or dyspnea on exertion negative for - edema, irregular heartbeat, loss of consciousness, murmur, orthopnea, palpitations, paroxysmal nocturnal dyspnea or rapid heart rate, syncope/near syncope, TIA/amaurosis fugax.  He does note easy bruising, but denies any significant medical hematochezia and hematuria or epistaxis.   Past Medical History  Diagnosis Date  . Non-ST elevation MI (NSTEMI) June 2011    PCI to LAD  . CAD S/P percutaneous coronary angioplasty 12/2009; 08/2010    a) 6/'22 - NSTEMI: PCI to LAD: Promus Element 2.5 mm x 15 mm DES; b) Cath for Angina: Prox LAD 60-70% pre-stent with FFR 0.82, 90% RVM  -- Med Rx, EF 50-55%  . Essential hypertension   . Hyperlipidemia with target LDL less than 70     Statin intolerance  . Type 2 diabetes mellitus without complications   . BPH (benign prostatic hyperplasia)    Prior Cardiac Evaluation and Past Surgical History: Reviewed. Pertinent data in PMH Medications and Allergies Reviewed in Epic No Changes in Social and Family History: Reviewed in Epic  ROS: A comprehensive Review of Systems was performed: Review of Systems  Respiratory: Negative.   Cardiovascular: Negative for chest pain, palpitations, orthopnea, claudication, leg swelling and PND.  Gastrointestinal: Negative for blood in stool and melena.  Genitourinary: Negative for hematuria.  Musculoskeletal: Positive for joint pain.  Skin: Positive for rash.       Still has intermittent rash on his leg, but improved   Neurological: Positive for dizziness. Negative for sensory change, speech change, focal weakness, seizures and loss of consciousness.       Positional dizziness    Wt Readings from Last 3 Encounters:  02/13/14 192 lb 11.2 oz (87.408 kg)  12/26/13 196 lb 6.4 oz (89.086 kg)  09/02/13 193 lb (87.544 kg)    PHYSICAL EXAM BP 102/70  Pulse 70  Ht 5' 10.5" (1.791 m)  Wt 192 lb 11.2 oz (87.408 kg)  BMI 27.25 kg/m2 --  General appearance: A&O, cooperative, appears stated age, no distress and healthy-appearing, well-nourished and well-groomed.  Answers questions appropriately. HEENT: [place]/AT, EOMI, MMM, anicteric sclera Neck: no adenopathy, no carotid bruit, no JVD, supple, symmetrical, trachea midline and thyroid not enlarged, symmetric, no tenderness/mass/nodules Lungs: CTAB, normal percussion bilaterally and nonlabored, good air movement. No W./R../R. Heart: RRR, S1, S2 normal, no murmur, click, rub or gallop and normal apical impulse Abdomen: soft, non-tender; bowel sounds normal; no masses,  no organomegaly Extremities: extremities normal, atraumatic, no cyanosis or edema;  no significant rash  Pulses: 2+ and symmetric Neurologic: normal strength and tone. Normal symmetric reflexes. Normal coordination and gait  [id number] today: Yes Rate: 70, Rhythm: NSR, LAFB. There are intermittent pauses which have the appearance of possible SA nodal block  Recent Labs: June 2015 as compared to November 2014 and February 2015: TC 156 (down from 177 and 164), HDL 49 (stable), LDL 88 (up from 82 but down from 91, TG 93 (down from 224 and 142)  -- on Zetia only  ASSESSMENT / PLAN:  Non-ST elevated myocardial infarction (non-STEMI) No recurrent anginal symptoms to suggest nonSTEMI/ACS at least since his catheterization in 2012. He remains active with no symptoms of exertional angina. No heart failure symptoms either  CAD S/P percutaneous coronary angioplasty and PCI- DES mid LAD in the setting of non-STEMI Stable on aspirin plus Plavix. He does have bruising and I think it's okay for him to take his aspirin on a twice a day basis and potentially quit the aspirin altogether. He is on ACE inhibitor but not on a beta blocker, partly because of his borderline hypotension but also because of fatigue on beta blocker in the past. Presence of possible SA block on EKG today and would strength in the case for not using beta blocker.  Essential hypertension Stable on low dose of ACE inhibitor.  Hyperlipidemia with statin intolerance He still doing pretty well on his Zetia alone. Not quite at goal but we are limited to Zetia and exercise. He is intolerant of statins as well as TriCor which really limit her options. As well as they are doing I don't think we need to worry about the PCS K9 inhibitor should be coming out soon  T2 NIDDM w/Stage 2 CKD (GFR 23ml/min) Only on metformin. Monitored by PCP.  Tobacco dependency He is not interested in stopping his pipe. I tried to counsel him several sessions ago, and he simply dismissed the topic.   Orders Placed This Encounter    Procedures  . EKG 12-Lead   Followup: Six-month   Leonie Man, M.D., M.S. Interventional Cardiologist   Pager # [phone]

## 2014-02-17 NOTE — Assessment & Plan Note (Signed)
Stable on low dose of ACE inhibitor.

## 2014-02-17 NOTE — Assessment & Plan Note (Signed)
Only on metformin. Monitored by PCP.

## 2014-02-17 NOTE — Assessment & Plan Note (Signed)
He still doing pretty well on his Zetia alone. Not quite at goal but we are limited to Zetia and exercise. He is intolerant of statins as well as TriCor which really limit her options. As well as they are doing I don't think we need to worry about the PCS K9 inhibitor should be coming out soon

## 2014-03-31 ENCOUNTER — Ambulatory Visit: Payer: Self-pay | Admitting: Emergency Medicine

## 2014-04-04 ENCOUNTER — Other Ambulatory Visit: Payer: Self-pay | Admitting: Cardiology

## 2014-04-04 NOTE — Telephone Encounter (Signed)
Rx was sent to pharmacy electronically. 

## 2014-04-13 ENCOUNTER — Ambulatory Visit (INDEPENDENT_AMBULATORY_CARE_PROVIDER_SITE_OTHER): Payer: Medicare Other | Admitting: Internal Medicine

## 2014-04-13 ENCOUNTER — Encounter: Payer: Self-pay | Admitting: Internal Medicine

## 2014-04-13 VITALS — BP 132/72 | HR 60 | Temp 97.7°F | Resp 16 | Ht 71.0 in | Wt 196.6 lb

## 2014-04-13 DIAGNOSIS — Z79899 Other long term (current) drug therapy: Secondary | ICD-10-CM

## 2014-04-13 DIAGNOSIS — I2510 Atherosclerotic heart disease of native coronary artery without angina pectoris: Secondary | ICD-10-CM | POA: Diagnosis not present

## 2014-04-13 DIAGNOSIS — E1129 Type 2 diabetes mellitus with other diabetic kidney complication: Secondary | ICD-10-CM

## 2014-04-13 DIAGNOSIS — Z9861 Coronary angioplasty status: Secondary | ICD-10-CM

## 2014-04-13 DIAGNOSIS — I10 Essential (primary) hypertension: Secondary | ICD-10-CM

## 2014-04-13 DIAGNOSIS — Z0000 Encounter for general adult medical examination without abnormal findings: Secondary | ICD-10-CM

## 2014-04-13 DIAGNOSIS — E785 Hyperlipidemia, unspecified: Secondary | ICD-10-CM

## 2014-04-13 DIAGNOSIS — E559 Vitamin D deficiency, unspecified: Secondary | ICD-10-CM

## 2014-04-13 NOTE — Progress Notes (Signed)
Patient ID: David [name], male   DOB: Aug 23, 1935, 78 y.o.   MRN: [id number] MEDICARE ANNUAL WELLNESS VISIT AND Quarterly OV  Assessment:   1. Essential hypertension  - TSH  2. Hyperlipidemia with statin intolerance  - Lipid panel  3. T2 NIDDM w/Stage 2 CKD (GFR 94ml/min)  - Hemoglobin A1c - Insulin, fasting  4. Vitamin D Deficiency  - Vit D  25 hydroxy (rtn osteoporosis monitoring)  5. Encounter for long-term (current) use of other medications  - CBC with Differential - BASIC METABOLIC PANEL WITH GFR - Hepatic function panel - Magnesium  6. Routine general medical examination at a health care facility  -Annual Medicare Wellness Visit  Plan:   During the course of the visit the patient was educated and counseled about appropriate screening and preventive services including:    Pneumococcal vaccine   Influenza vaccine  Td vaccine  Screening electrocardiogram  Bone densitometry screening  Colorectal cancer screening  Diabetes screening  Glaucoma screening  Nutrition counseling   Advanced directives: requested  Screening recommendations, referrals: Vaccinations: DT  2002 - had allergic rxn Influenza vaccine NA Pneumococcal vaccine 2012 Prevnar 04/13/2014 Shingles vaccine declined Hep B vaccine not indicated  Nutrition assessed and recommended  Colonoscopy 04/2004 & due this year Recommended yearly ophthalmology/optometry visit for glaucoma screening and checkup Recommended yearly dental visit for hygiene and checkup Advanced directives - yes  Conditions/risks identified: BMI: Discussed weight loss, diet, and increase physical activity.  Increase physical activity: AHA recommends 150 minutes of physical activity a week.  Medications reviewed Diabetes is at goal, ACE/ARB therapy: Yes. Urinary Incontinence is not an issue: discussed non pharmacology and pharmacology options.  Fall risk: low- discussed PT, home fall assessment, medications.     Subjective:  David [name] is a 78 y.o. male who presents for Medicare Annual Wellness Visit and complete physical.  Date of last medicare wellness visit is unknown.  He has had elevated blood pressure since 2000. His blood pressure has been controlled at home, today their BP is BP: 132/72 mmHg He does workout. He denies chest pain, shortness of breath, dizziness.  He is on cholesterol medication and denies myalgias. (He is intolerant/Allergic to Statins)  His cholesterol is at goal. The cholesterol last visit was:   Lab Results  Component Value Date   CHOL 156 12/26/2013   HDL 49 12/26/2013   LDLCALC 88 12/26/2013   TRIG 93 12/26/2013   CHOLHDL 3.2 12/26/2013   He has had diabetes for 9 years since 2006. He has been working on diet and exercise for diabetes, and denies foot ulcerations, hyperglycemia, hypoglycemia , nausea, paresthesia of the feet, polydipsia, polyuria and visual disturbances. Last A1C in the office was:  Lab Results  Component Value Date   HGBA1C 6.0* 12/26/2013   Patient is on Vitamin D supplement.  926 in 2008) Lab Results  Component Value Date   VD25OH 78 12/26/2013       Names of Other Physician/Practitioners you currently use: 1. [facility] here for primary care 2. Dr Herbert Deaner, eye doctor, last visit 2015 3. Dr [place], dentist, last visit 2015   Patient Care Team: [name], MD as PCP - General (Internal Medicine) Winfield Cunas., MD as Consulting Physician (Gastroenterology) Leonie Man, MD as Consulting Physician (Cardiology)   Medication Review: Current Outpatient Prescriptions on File Prior to Visit  Medication Sig Dispense Refill  . aspirin EC 81 MG tablet Take 1 tablet (81 mg  total) by mouth every other day.  30 tablet  11  . cholecalciferol (VITAMIN D) 1000 UNITS tablet Take 2,000 Units by mouth daily.       Marland Kitchen CINNAMON PO Take 1,000 mg by mouth 2 (two) times daily.      . clopidogrel (PLAVIX)  75 MG tablet Take 1 tablet (75 mg total) by mouth daily.  90 tablet  3  . doxazosin (CARDURA) 4 MG tablet TAKE 1 TABLET BY MOUTH AT BEDTIME  90 tablet  3  . finasteride (PROSCAR) 5 MG tablet Take 5 mg by mouth daily.      Marland Kitchen lisinopril (PRINIVIL,ZESTRIL) 5 MG tablet TAKE 1 TABLET BY MOUTH DAILY.  90 tablet  3  . metFORMIN (GLUCOPHAGE) 500 MG tablet Take 500 mg by mouth. 1 pill at breakfast and 2 pills dinner      . ZETIA 10 MG tablet TAKE 1 TABLET EVERY DAY FOR CHOLESTEROL  90 tablet  1   No current facility-administered medications on file prior to visit.    Current Problems (verified) Patient Active Problem List   Diagnosis Date Noted  . Vitamin D Deficiency 12/25/2013  . Encounter for long-term (current) use of other medications 12/25/2013  . Obesity 02/23/2013    Class: Chronic  . Tobacco dependency 02/22/2013  . Hyperlipidemia with statin intolerance   . Essential hypertension   . T2 NIDDM w/Stage 2 CKD (GFR 17ml/min)   . Non-ST elevated myocardial infarction (non-STEMI) 01/17/2010  . CAD S/P percutaneous coronary angioplasty and PCI- DES mid LAD in the setting of non-STEMI 12/31/2009    Screening Tests Health Maintenance  Topic Date Due  . Foot Exam  02/07/1948  . Tetanus/tdap  02/06/1957  . Colonoscopy  02/07/1988  . Zostavax  02/06/1998  . Pneumococcal Polysaccharide Vaccine Age 79 And Over  02/07/2003  . Ophthalmology Exam  11/03/2013  . Influenza Vaccine  02/18/2014  . Urine Microalbumin  06/01/2014  . Hemoglobin A1c  06/27/2014    Immunization History  Administered Date(s) Administered  . Influenza, High Dose Seasonal PF 06/01/2013  . Influenza-Unspecified 05/13/2011  . Pneumococcal Polysaccharide-23 07/21/2010  . Td 07/21/2000    Preventative care: Last colonoscopy: 04/2004  Prior vaccinations: TD : 2002 - allergic rxn  Influenza: Not Available today  Pneumococcal: 2012 Prevnar 04/13/2014 Shingles/Zostavax: declines  History reviewed: allergies,  current medications, past family history, past medical history, past social history, past surgical history and problem list   Risk Factors: Tobacco History  Substance Use Topics  . Smoking status: Current Every Day Smoker -- 60 years    Types: Pipe  . Smokeless tobacco: Never Used     Comment: smokes pipe qd  . Alcohol Use: 3.5 oz/week    7 drink(s) per week   He does smoke pipe occasionally.  Patient is a former smoker. Are there smokers in your home (other than you)?  No  Alcohol Current alcohol use: social drinker, glass of wine with dinner  Caffeine Current caffeine use: coffee 2-4 /day  Exercise Current exercise: gardening, walking and yard work  Nutrition/Diet Current diet: in general, a "healthy" diet    Cardiac risk factors: advanced age (older than 50 for men, 86 for women), diabetes mellitus, dyslipidemia, hypertension, male gender and smoking/ tobacco exposure.  Depression Screen (Note: if answer to either of the following is "Yes", a more complete depression screening is indicated)   Q1: Over the past two weeks, have you felt down, depressed or hopeless? No  Q2: Over the past  two weeks, have you felt little interest or pleasure in doing things? No  Have you lost interest or pleasure in daily life? No  Do you often feel hopeless? No  Do you cry easily over simple problems? No  Activities of Daily Living In your present state of health, do you have any difficulty performing the following activities?:  Driving? No Managing money?  No Feeding yourself? No Getting from bed to chair? No Climbing a flight of stairs? No Preparing food and eating?: No Bathing or showering? No Getting dressed: No Getting to the toilet? No Using the toilet:No Moving around from place to place: No In the past year have you fallen or had a near fall?:No   Are you sexually active?  Yes  Do you have more than one partner?  No  Vision Difficulties: No  Hearing Difficulties:  No Do you often ask people to speak up or repeat themselves? No Do you experience ringing or noises in your ears? No Do you have difficulty understanding soft or whispered voices? No  Cognition  Do you feel that you have a problem with memory?No  Do you often misplace items? No  Do you feel safe at home?  Yes  Advanced directives Does patient have a [place]? Yes Does patient have a Living Will? Yes   Objective:     Blood pressure 132/72, pulse 60, temperature 97.7 F (36.5 C), temperature source Temporal, resp. rate 16, height 5\' 11"  (1.803 m), weight 196 lb 9.6 oz (89.177 kg). Body mass index is 27.43 kg/(m^2).  General appearance: alert, no distress, WD/WN, male Cognitive Testing  Alert? Yes  Normal Appearance? Yes  Oriented to person? Yes  Place? Yes   Time? Yes  Recall of three objects?  Yes  Can perform simple calculations? Yes  Displays appropriate judgment? Yes  Can read the correct time from a watch/clock? Yes  HEENT: normocephalic, sclerae anicteric, TMs pearly, nares patent, no discharge or erythema, pharynx normal Oral cavity: MMM, no lesions Neck: supple, no lymphadenopathy, no thyromegaly, no masses Heart: RRR, normal S1, S2, no murmurs Lungs: CTA bilaterally, no wheezes, rhonchi, or rales Abdomen: +bs, soft, non tender, non distended, no masses, no hepatomegaly, no splenomegaly Musculoskeletal: nontender, no swelling, no obvious deformity Extremities: no edema, no cyanosis, no clubbing Pulses: 2+ symmetric, upper and lower extremities, normal cap refill Neurological: alert, oriented x 3, CN2-12 intact, strength normal upper extremities and lower extremities, sensation normal throughout, DTRs 2+ throughout, no cerebellar signs, gait normal Psychiatric: normal affect, behavior normal, pleasant   Medicare Attestation I have personally reviewed: The patient's medical and social history Their use of alcohol, tobacco or illicit drugs Their  current medications and supplements The patient's functional ability including ADLs,fall risks, home safety risks, cognitive, and hearing and visual impairment Diet and physical activities Evidence for depression or mood disorders  The patient's weight, height, BMI, and visual acuity have been recorded in the chart.  I have made referrals, counseling, and provided education to the patient based on review of the above and I have provided the patient with a written personalized care plan for preventive services.    Riki Gehring DAVID, MD   04/13/2014

## 2014-04-13 NOTE — Patient Instructions (Signed)

## 2014-04-14 LAB — CBC WITH DIFFERENTIAL/PLATELET
BASOS PCT: 0 % (ref 0–1)
Basophils Absolute: 0 10*3/uL (ref 0.0–0.1)
EOS ABS: 0.4 10*3/uL (ref 0.0–0.7)
Eosinophils Relative: 5 % (ref 0–5)
HCT: 42.8 % (ref 39.0–52.0)
HEMOGLOBIN: 14.4 g/dL (ref 13.0–17.0)
LYMPHS ABS: 1.6 10*3/uL (ref 0.7–4.0)
Lymphocytes Relative: 21 % (ref 12–46)
MCH: 31.6 pg (ref 26.0–34.0)
MCHC: 33.6 g/dL (ref 30.0–36.0)
MCV: 94.1 fL (ref 78.0–100.0)
MONO ABS: 1 10*3/uL (ref 0.1–1.0)
MONOS PCT: 13 % — AB (ref 3–12)
Neutro Abs: 4.6 10*3/uL (ref 1.7–7.7)
Neutrophils Relative %: 61 % (ref 43–77)
Platelets: 237 10*3/uL (ref 150–400)
RBC: 4.55 MIL/uL (ref 4.22–5.81)
RDW: 13.2 % (ref 11.5–15.5)
WBC: 7.5 10*3/uL (ref 4.0–10.5)

## 2014-04-14 LAB — BASIC METABOLIC PANEL WITH GFR
BUN: 15 mg/dL (ref 6–23)
CO2: 28 meq/L (ref 19–32)
Calcium: 9.3 mg/dL (ref 8.4–10.5)
Chloride: 102 mEq/L (ref 96–112)
Creat: 0.86 mg/dL (ref 0.50–1.35)
GFR, Est Non African American: 84 mL/min
Glucose, Bld: 69 mg/dL — ABNORMAL LOW (ref 70–99)
POTASSIUM: 4 meq/L (ref 3.5–5.3)
SODIUM: 137 meq/L (ref 135–145)

## 2014-04-14 LAB — HEPATIC FUNCTION PANEL
ALT: 17 U/L (ref 0–53)
AST: 17 U/L (ref 0–37)
Albumin: 4.1 g/dL (ref 3.5–5.2)
Alkaline Phosphatase: 55 U/L (ref 39–117)
Bilirubin, Direct: 0.1 mg/dL (ref 0.0–0.3)
Indirect Bilirubin: 0.4 mg/dL (ref 0.2–1.2)
TOTAL PROTEIN: 6.3 g/dL (ref 6.0–8.3)
Total Bilirubin: 0.5 mg/dL (ref 0.2–1.2)

## 2014-04-14 LAB — LIPID PANEL
CHOLESTEROL: 153 mg/dL (ref 0–200)
HDL: 45 mg/dL (ref >39)
LDL Cholesterol: 73 mg/dL (ref 0–99)
Total CHOL/HDL Ratio: 3.4 Ratio
Triglycerides: 173 mg/dL — ABNORMAL HIGH (ref <150)
VLDL: 35 mg/dL (ref 0–40)

## 2014-04-14 LAB — INSULIN, FASTING: Insulin fasting, serum: 5 u[IU]/mL (ref 2.0–19.6)

## 2014-04-14 LAB — VITAMIN D 25 HYDROXY (VIT D DEFICIENCY, FRACTURES): Vit D, 25-Hydroxy: 71 ng/mL (ref 30–89)

## 2014-04-14 LAB — HEMOGLOBIN A1C
Hgb A1c MFr Bld: 6.1 % — ABNORMAL HIGH (ref <5.7)
Mean Plasma Glucose: 128 mg/dL — ABNORMAL HIGH (ref <117)

## 2014-04-14 LAB — MAGNESIUM: Magnesium: 2 mg/dL (ref 1.5–2.5)

## 2014-04-14 LAB — TSH: TSH: 2.047 u[IU]/mL (ref 0.350–4.500)

## 2014-04-18 ENCOUNTER — Ambulatory Visit (INDEPENDENT_AMBULATORY_CARE_PROVIDER_SITE_OTHER): Payer: Medicare Other | Admitting: *Deleted

## 2014-04-18 ENCOUNTER — Other Ambulatory Visit: Payer: Self-pay | Admitting: Internal Medicine

## 2014-04-18 DIAGNOSIS — Z23 Encounter for immunization: Secondary | ICD-10-CM | POA: Diagnosis not present

## 2014-05-10 ENCOUNTER — Other Ambulatory Visit: Payer: Self-pay | Admitting: Physician Assistant

## 2014-05-10 ENCOUNTER — Other Ambulatory Visit: Payer: Self-pay | Admitting: Emergency Medicine

## 2014-06-05 ENCOUNTER — Encounter: Payer: Self-pay | Admitting: Internal Medicine

## 2014-06-08 DIAGNOSIS — Z86010 Personal history of colonic polyps: Secondary | ICD-10-CM | POA: Diagnosis not present

## 2014-06-08 DIAGNOSIS — K648 Other hemorrhoids: Secondary | ICD-10-CM | POA: Diagnosis not present

## 2014-06-08 DIAGNOSIS — K5730 Diverticulosis of large intestine without perforation or abscess without bleeding: Secondary | ICD-10-CM | POA: Diagnosis not present

## 2014-06-08 DIAGNOSIS — D124 Benign neoplasm of descending colon: Secondary | ICD-10-CM | POA: Diagnosis not present

## 2014-06-08 DIAGNOSIS — Z09 Encounter for follow-up examination after completed treatment for conditions other than malignant neoplasm: Secondary | ICD-10-CM | POA: Diagnosis not present

## 2014-06-23 ENCOUNTER — Ambulatory Visit (INDEPENDENT_AMBULATORY_CARE_PROVIDER_SITE_OTHER): Payer: Medicare Other | Admitting: Physician Assistant

## 2014-06-23 VITALS — BP 118/82 | HR 78 | Temp 98.4°F | Resp 18 | Ht 71.0 in | Wt 188.0 lb

## 2014-06-23 DIAGNOSIS — J0100 Acute maxillary sinusitis, unspecified: Secondary | ICD-10-CM | POA: Diagnosis not present

## 2014-06-23 DIAGNOSIS — I2510 Atherosclerotic heart disease of native coronary artery without angina pectoris: Secondary | ICD-10-CM

## 2014-06-24 ENCOUNTER — Encounter: Payer: Self-pay | Admitting: Physician Assistant

## 2014-06-24 NOTE — Progress Notes (Signed)
Subjective:    Patient ID: David [name], male    DOB: 1936-06-08, 78 y.o.   MRN: [id number]  HPI Comments: EPIC EMR was down on 06/23/14 all day.  This note was written 06/24/14 at 10:53am.  Sinusitis This is a new problem. Episode onset: 3-4 days ago. The problem has been gradually worsening since onset. Maximum temperature: Did not check at home. The pain is mild. Associated symptoms include chills, congestion, coughing, diaphoresis, ear pain, sinus pressure and a sore throat. Pertinent negatives include no shortness of breath. (No sick contact.  States his pipe is his pacifier and uses it all day long.) Past treatments include nothing.  GFR= 84 on 04/13/14 Review of Systems  Constitutional: Positive for chills and diaphoresis. Negative for fever and fatigue.  HENT: Positive for congestion, dental problem, ear pain, rhinorrhea, sinus pressure and sore throat. Negative for ear discharge, facial swelling, mouth sores, postnasal drip, trouble swallowing and voice change.   Eyes: Negative.   Respiratory: Positive for cough. Negative for chest tightness, shortness of breath and wheezing.        Cough non-productive.  States he thinks it is from post-nasal drip.  Cardiovascular: Negative.  Negative for chest pain.  Gastrointestinal: Negative.  Negative for nausea, vomiting, abdominal pain, diarrhea and constipation.  Genitourinary: Negative.   Musculoskeletal: Negative.   Skin: Negative.  Negative for rash.  Neurological: Negative.    Past Medical History  Diagnosis Date  . Non-ST elevation MI (NSTEMI) June 2011    PCI to LAD  . CAD S/P percutaneous coronary angioplasty 12/2009; 08/2010    a) 6/'22 - NSTEMI: PCI to LAD: Promus Element 2.5 mm x 15 mm DES; b) Cath for Angina: Prox LAD 60-70% pre-stent with FFR 0.82, 90% RVM -- Med Rx, EF 50-55%  . Essential hypertension   . Hyperlipidemia with target LDL less than 70     Statin intolerance  . Type 2 diabetes mellitus without complications    . BPH (benign prostatic hyperplasia)    History   Social History  . Marital Status: Married    Spouse Name: N/A    Number of Children: N/A  . Years of Education: N/A   Occupational History  . Not on file.   Social History Main Topics  . Smoking status: Current Every Day Smoker -- 60 years    Types: Pipe  . Smokeless tobacco: Never Used     Comment: smokes pipe qd  . Alcohol Use: 3.5 oz/week    7 drink(s) per week  . Drug Use: No  . Sexual Activity: Not on file   Other Topics Concern  . Not on file   Social History Narrative   He is a married father of 2 with no grandchildren as of yet. He continued to smoke a pipe several times the course of day. He does have an occasional alcoholic beverage. While not involved a standard exercise routine, he is very active with walking and working in the yard, splitting wood and doing aggressive yard work including Production designer, theatre/television/film.   Current Outpatient Prescriptions on File Prior to Visit  Medication Sig Dispense Refill  . aspirin EC 81 MG tablet Take 1 tablet (81 mg total) by mouth every other day. 30 tablet 11  . cholecalciferol (VITAMIN D) 1000 UNITS tablet Take 2,000 Units by mouth daily.     Marland Kitchen CINNAMON PO Take 1,000 mg by mouth 2 (two) times daily.    . clopidogrel (PLAVIX) 75 MG  tablet Take 1 tablet (75 mg total) by mouth daily. 90 tablet 3  . doxazosin (CARDURA) 4 MG tablet TAKE 1 TABLET BY MOUTH AT BEDTIME 90 tablet 3  . finasteride (PROSCAR) 5 MG tablet Take 5 mg by mouth daily.    Marland Kitchen lisinopril (PRINIVIL,ZESTRIL) 5 MG tablet TAKE 1 TABLET BY MOUTH DAILY. 90 tablet 3  . Magnesium 500 MG CAPS Take 1 capsule by mouth daily.    . metFORMIN (GLUCOPHAGE) 500 MG tablet Take 500 mg by mouth. 1 pill at breakfast and 2 pills dinner    . metFORMIN (GLUCOPHAGE-XR) 500 MG 24 hr tablet TAKE 2 TABLETS BY MOUTH TWICE DAILY 360 tablet 2  . ZETIA 10 MG tablet TAKE 1 TABLET BY MOUTH EVERY DAY 90 tablet 1   No current  facility-administered medications on file prior to visit.   Allergies  Allergen Reactions  . Statins Other (See Comments)    Myalgias and cramping Both Crestor and Lipitor  . Tetanus Toxoids      BP 118/82 mmHg  Pulse 78  Temp(Src) 98.4 F (36.9 C)  Resp 18  Ht 5\' 11"  (1.803 m)  Wt 188 lb (85.276 kg)  BMI 26.23 kg/m2  SpO2 98% Wt Readings from Last 3 Encounters:  06/23/14 188 lb (85.276 kg)  04/13/14 196 lb 9.6 oz (89.177 kg)  02/13/14 192 lb 11.2 oz (87.408 kg)   Objective:   Physical Exam  Constitutional: He is oriented to person, place, and time. He appears well-developed and well-nourished. He has a sickly appearance. No distress.  HENT:  Head: Normocephalic.  Right Ear: Tympanic membrane, external ear and ear canal normal.  Left Ear: Tympanic membrane, external ear and ear canal normal.  Nose: Mucosal edema, rhinorrhea and sinus tenderness present. Right sinus exhibits maxillary sinus tenderness. Right sinus exhibits no frontal sinus tenderness. Left sinus exhibits maxillary sinus tenderness. Left sinus exhibits no frontal sinus tenderness.  Mouth/Throat: Uvula is midline and mucous membranes are normal. Mucous membranes are not pale and not dry. No trismus in the jaw. No uvula swelling. Posterior oropharyngeal erythema present. No oropharyngeal exudate, posterior oropharyngeal edema or tonsillar abscesses.  Wears hearing aids in both ears.  Small amount of blood in right ear canal.  Eyes: Conjunctivae and lids are normal. Pupils are equal, round, and reactive to light. Right eye exhibits no discharge. Left eye exhibits no discharge. No scleral icterus.  Neck: Trachea normal, normal range of motion and phonation normal. Neck supple. No tracheal deviation present.  Cardiovascular: Normal rate, regular rhythm, S1 normal, S2 normal, normal heart sounds and normal pulses.  Exam reveals no gallop, no distant heart sounds and no friction rub.   No murmur heard. Pulmonary/Chest:  Effort normal and breath sounds normal. No stridor. No respiratory distress. He has no decreased breath sounds. He has no wheezes. He has no rhonchi. He has no rales. He exhibits no tenderness.  Abdominal: Soft. Bowel sounds are normal. There is no tenderness. There is no rebound and no guarding.  Lymphadenopathy:  No tenderness or LAD.  Neurological: He is alert and oriented to person, place, and time. Gait normal.  Skin: Skin is warm, dry and intact. No rash noted. He is not diaphoretic.  Psychiatric: He has a normal mood and affect. His speech is normal and behavior is normal. Judgment and thought content normal. Cognition and memory are normal.  Vitals reviewed.  Assessment & Plan:  1. Acute maxillary sinusitis, recurrence not specified Gave patient paper prescription for Z-Pak- #1-  Take as directed- No refills. Gave patient paper prescription for Prednisone 10mg - #18- Take 1 tablet PO TID x 3 days, then take 1 tablet PO BID x 3 days, then take 1 tablet PO QDaily x 3 days- No refills.  Patient does admit to Prednisone making him hyper so prescribed lower dose.  Patient states he wanted prescription to help with inflammation. Make sure you are drinking plenty of water to stay hydrated.  Discussed medication effects and SE's.  Pt agreed to treatment plan. If you are not feeling better in 10-14 days, then please call the office.  Perle Brickhouse, Stephani Police, PA-C 10:53 AM [facility]

## 2014-06-30 ENCOUNTER — Encounter: Payer: Self-pay | Admitting: Internal Medicine

## 2014-07-07 ENCOUNTER — Telehealth: Payer: Self-pay | Admitting: Cardiology

## 2014-07-07 NOTE — Telephone Encounter (Signed)
Received medical records for up coming appointment with Dr. Ellyn Hack on 08/14/2014 fax from [place] on 07/07/2014 records given to [facility].  cbr

## 2014-08-02 ENCOUNTER — Encounter: Payer: Self-pay | Admitting: Internal Medicine

## 2014-08-02 ENCOUNTER — Ambulatory Visit (INDEPENDENT_AMBULATORY_CARE_PROVIDER_SITE_OTHER): Payer: Medicare Other | Admitting: Internal Medicine

## 2014-08-02 ENCOUNTER — Other Ambulatory Visit: Payer: Self-pay | Admitting: Internal Medicine

## 2014-08-02 VITALS — BP 130/76 | HR 64 | Temp 97.7°F | Resp 16 | Ht 70.5 in | Wt 189.0 lb

## 2014-08-02 DIAGNOSIS — Z125 Encounter for screening for malignant neoplasm of prostate: Secondary | ICD-10-CM | POA: Diagnosis not present

## 2014-08-02 DIAGNOSIS — E1121 Type 2 diabetes mellitus with diabetic nephropathy: Secondary | ICD-10-CM

## 2014-08-02 DIAGNOSIS — E785 Hyperlipidemia, unspecified: Secondary | ICD-10-CM | POA: Diagnosis not present

## 2014-08-02 DIAGNOSIS — Z1331 Encounter for screening for depression: Secondary | ICD-10-CM

## 2014-08-02 DIAGNOSIS — I10 Essential (primary) hypertension: Secondary | ICD-10-CM

## 2014-08-02 DIAGNOSIS — E559 Vitamin D deficiency, unspecified: Secondary | ICD-10-CM | POA: Diagnosis not present

## 2014-08-02 DIAGNOSIS — Z1212 Encounter for screening for malignant neoplasm of rectum: Secondary | ICD-10-CM

## 2014-08-02 DIAGNOSIS — Z79899 Other long term (current) drug therapy: Secondary | ICD-10-CM | POA: Diagnosis not present

## 2014-08-02 DIAGNOSIS — I2510 Atherosclerotic heart disease of native coronary artery without angina pectoris: Secondary | ICD-10-CM

## 2014-08-02 DIAGNOSIS — F17200 Nicotine dependence, unspecified, uncomplicated: Secondary | ICD-10-CM

## 2014-08-02 DIAGNOSIS — Z9181 History of falling: Secondary | ICD-10-CM

## 2014-08-02 LAB — CBC WITH DIFFERENTIAL/PLATELET
BASOS PCT: 0 % (ref 0–1)
Basophils Absolute: 0 10*3/uL (ref 0.0–0.1)
Eosinophils Absolute: 0.2 10*3/uL (ref 0.0–0.7)
Eosinophils Relative: 3 % (ref 0–5)
HCT: 46 % (ref 39.0–52.0)
Hemoglobin: 15.5 g/dL (ref 13.0–17.0)
LYMPHS PCT: 20 % (ref 12–46)
Lymphs Abs: 1.6 10*3/uL (ref 0.7–4.0)
MCH: 31.5 pg (ref 26.0–34.0)
MCHC: 33.7 g/dL (ref 30.0–36.0)
MCV: 93.5 fL (ref 78.0–100.0)
MPV: 10 fL (ref 8.6–12.4)
Monocytes Absolute: 0.7 10*3/uL (ref 0.1–1.0)
Monocytes Relative: 8 % (ref 3–12)
NEUTROS ABS: 5.7 10*3/uL (ref 1.7–7.7)
Neutrophils Relative %: 69 % (ref 43–77)
PLATELETS: 246 10*3/uL (ref 150–400)
RBC: 4.92 MIL/uL (ref 4.22–5.81)
RDW: 13.3 % (ref 11.5–15.5)
WBC: 8.2 10*3/uL (ref 4.0–10.5)

## 2014-08-02 NOTE — Patient Instructions (Signed)

## 2014-08-02 NOTE — Progress Notes (Addendum)
Patient ID: David [name], male   DOB: 1935/09/07, 79 y.o.   MRN: [id number] Annual Comprehensive Examination  This very nice 79 y.o.MWM presents for complete physical.  Patient has been followed for HTN, T2_NIDDM  Prediabetes, Hyperlipidemia, and Vitamin D Deficiency.   HTN predates since 2000 . In June 2011, patient had a NSTMI and in Nov 2010he had a DES and again in 2012 a 2sd DES. Patient's BP has been controlled at home.Today's BP: 130/76 mmHg. Patient denies any cardiac symptoms as chest pain, palpitations, shortness of breath, dizziness or ankle swelling. He does exercise regularly and stays active splitting wood, etc.   Patient's hyperlipidemia is controlled with diet and medications. Patient denies myalgias or other medication SE's. Last lipids were at goal - Total Chol 153; HDL 45; LDL 73; Trig 173 on 04/13/2014.   Patient has T2_NIDDM w/Stage 2 CKD (GFR 73 ml/min)  Predating since 2006 and patient denies reactive hypoglycemic symptoms, visual blurring, diabetic polys or paresthesias. CBG's range less than 140-150 mg%. Last A1c was  6.1% on 04/13/2014.   Finally, patient has history of Vitamin D Deficiency of  22 in 2008 and last vitamin D was 71 on 04/13/2014.  Medication Sig  . aspirin EC 81 MG tablet Take 1 tablet (81 mg total) by mouth every other day.  . cholecalciferol (VITAMIN D) 1000 UNITS tablet Take 2,000 Units by mouth daily.   Marland Kitchen CINNAMON PO Take 1,000 mg by mouth 2 (two) times daily.  . clopidogrel (PLAVIX) 75 MG tablet Take 1 tablet (75 mg total) by mouth daily.  Marland Kitchen doxazosin (CARDURA) 4 MG tablet TAKE 1 TABLET BY MOUTH AT BEDTIME  . finasteride (PROSCAR) 5 MG tablet Take 5 mg by mouth daily.  Marland Kitchen lisinopril (PRINIVIL,ZESTRIL) 5 MG tablet TAKE 1 TABLET BY MOUTH DAILY.  . Magnesium 500 MG CAPS Take 1 capsule by mouth daily.  . metFORMIN (GLUCOPHAGE-XR) 500 MG 24 hr tablet TAKE 2 TABLETS BY MOUTH TWICE DAILY  . ZETIA 10 MG tablet TAKE 1 TABLET BY MOUTH EVERY DAY   Allergies   Allergen Reactions  . Statins Other (See Comments)    Myalgias and cramping Both Crestor and Lipitor  . Tetanus Toxoids    Past Medical History  Diagnosis Date  . Non-ST elevation MI (NSTEMI) June 2011    PCI to LAD  . CAD S/P percutaneous coronary angioplasty 12/2009; 08/2010    a) 6/'22 - NSTEMI: PCI to LAD: Promus Element 2.5 mm x 15 mm DES; b) Cath for Angina: Prox LAD 60-70% pre-stent with FFR 0.82, 90% RVM -- Med Rx, EF 50-55%  . Essential hypertension   . Hyperlipidemia with target LDL less than 70     Statin intolerance  . Type 2 diabetes mellitus without complications   . BPH (benign prostatic hyperplasia)    Health Maintenance  Topic Date Due  . FOOT EXAM  02/07/1948  . COLONOSCOPY  02/07/1988  . ZOSTAVAX  02/06/1998  . TETANUS/TDAP  07/21/2010  . OPHTHALMOLOGY EXAM  11/03/2013  . URINE MICROALBUMIN  06/01/2014  . HEMOGLOBIN A1C  10/12/2014  . INFLUENZA VACCINE  02/19/2015  . PNEUMOCOCCAL POLYSACCHARIDE VACCINE AGE 87 AND OVER  Completed   Immunization History  Administered Date(s) Administered  . Influenza, High Dose Seasonal PF 06/01/2013, 04/18/2014  . Influenza-Unspecified 05/13/2011  . Pneumococcal Conjugate-13 04/18/2014  . Pneumococcal Polysaccharide-23 07/21/2002  . Td 07/21/2000   Past Surgical History  Procedure Laterality Date  . Cardiac catheterization  12/2009    Proximal  LAD stenosis followed by a significant 80-90% distal stenosis  . Coronary angioplasty  12/2009    PTCA to proximal LAD; PCI with Promus Element DES stent  2.5 mm x 15 mm  distalLAD - .for non-ST elevation MI  . Cardiac catheterization  February 2012     90% ostial RV marginal branch; 60-70% proximal LAD with widely patent distal stent. FFR 0.82; medical therapy  . Vasectomy    . Transthoracic echocardiogram  June 2011    Moderately dilated LV; moderate hypokinesis of anteroseptal and anterior wall consistent with MI. Grade 1 diastolic dysfunction. Mild to moderately dilated LA    Family History  Problem Relation Age of Onset  . Cancer Father     lung  . Stroke Father   . Cancer Sister     breast  . Cancer Brother     lung  . Cancer Son     history of prostate   History   Social History  . Marital Status: Married    Spouse Name: N/A    Number of Children: N/A  . Years of Education: N/A   Occupational History  . Not on file.   Social History Main Topics  . Smoking status: Current Every Day Smoker -- 60 years    Types: Pipe  . Smokeless tobacco: Never Used     Comment: smokes pipe qd  . Alcohol Use: 4.2 oz/week    7 Not specified per week     Comment: drinks 4 ounces of vodka daily  . Drug Use: No  . Sexual Activity: Not on file   Social History Narrative   He is a married father of 2 with no grandchildren as of yet. He continued to smoke a pipe several times the course of day. He does have an occasional alcoholic beverage. While not involved a standard exercise routine, he is very active with walking and working in the yard, splitting wood and doing aggressive yard work including Production designer, theatre/television/film.    ROS Constitutional: Denies fever, chills, weight loss/gain, headaches, insomnia, fatigue, night sweats or change in appetite. Eyes: Denies redness, blurred vision, diplopia, discharge, itchy or watery eyes.  ENT: Denies discharge, congestion, post nasal drip, epistaxis, sore throat, earache, hearing loss, dental pain, Tinnitus, Vertigo, Sinus pain or snoring.  Cardio: Denies chest pain, palpitations, irregular heartbeat, syncope, dyspnea, diaphoresis, orthopnea, PND, claudication or edema Respiratory: denies cough, dyspnea, DOE, pleurisy, hoarseness, laryngitis or wheezing.  Gastrointestinal: Denies dysphagia, heartburn, reflux, water brash, pain, cramps, nausea, vomiting, bloating, diarrhea, constipation, hematemesis, melena, hematochezia, jaundice or hemorrhoids Genitourinary: Denies dysuria, frequency, urgency, nocturia,  hesitancy, discharge, hematuria or flank pain Musculoskeletal: Denies arthralgia, myalgia, stiffness, Jt. Swelling, pain, limp or strain/sprain. Denies Falls. Skin: Denies puritis, rash, hives, warts, acne, eczema or change in skin lesion Neuro: No weakness, tremor, incoordination, spasms, paresthesia or pain Psychiatric: Denies confusion, memory loss or sensory loss. Denies Depression. Endocrine: Denies change in weight, skin, hair change, nocturia, and paresthesia, diabetic polys, visual blurring or hyper / hypo glycemic episodes.  Heme/Lymph: No excessive bleeding, bruising or enlarged lymph nodes.  Physical Exam  BP 130/76   Pulse 64  Temp 97.7 F   Resp 16  Ht 5' 10.5"   Wt 189 lb    BMI 26.73   General Appearance: Well nourished, in no apparent distress. Eyes: PERRLA, EOMs, conjunctiva no swelling or erythema, normal fundi and vessels. Sinuses: No frontal/maxillary tenderness ENT/Mouth: EACs patent / TMs  nl. Nares clear without  erythema, swelling, mucoid exudates. Oral hygiene is good. No erythema, swelling, or exudate. Tongue normal, non-obstructing. Tonsils not swollen or erythematous. Hearing normal.  Neck: Supple, thyroid normal. No bruits, nodes or JVD. Respiratory: Respiratory effort normal.  BS equal and clear bilateral without rales, rhonci, wheezing or stridor. Cardio: Heart sounds are normal with regular rate and rhythm and no murmurs, rubs or gallops. Peripheral pulses are normal and equal bilaterally without edema. No aortic or femoral bruits. Chest: symmetric with normal excursions and percussion.  Abdomen: Flat, soft, with bowl sounds. Nontender, no guarding, rebound, hernias, masses, or organomegaly.  Lymphatics: Non tender without lymphadenopathy.  Genitourinary: Deferred to Dr Gaynelle Arabian. Musculoskeletal: Full ROM all peripheral extremities, joint stability, 5/5 strength, and normal gait. Skin: Warm and dry without rashes, lesions, cyanosis, clubbing or   ecchymosis.  Neuro: Cranial nerves intact, reflexes equal bilaterally. Normal muscle tone, no cerebellar symptoms. Sensation is intact and normal by 2 point and monofilament testing to the toes bilaterally.Marland Kitchen  Pysch: Awake and oriented X 3 with normal affect, insight and judgment appropriate.   Assessment and Plan   1. Hypertension  2. ASCAD s/p PTCA/DES (2010/2012) 3. Hyperlipidemia 4. T2_NIDDM w/ CKD 5. Vitamin D Deficiency   Continue prudent diet as discussed, weight control, BP monitoring, regular exercise, and medications as discussed.  Discussed med effects and SE's. Routine screening labs and tests as requested with regular follow-up as recommended.

## 2014-08-03 LAB — HEPATIC FUNCTION PANEL
ALK PHOS: 58 U/L (ref 39–117)
ALT: 15 U/L (ref 0–53)
AST: 15 U/L (ref 0–37)
Albumin: 3.8 g/dL (ref 3.5–5.2)
BILIRUBIN DIRECT: 0.1 mg/dL (ref 0.0–0.3)
BILIRUBIN INDIRECT: 0.3 mg/dL (ref 0.2–1.2)
BILIRUBIN TOTAL: 0.4 mg/dL (ref 0.2–1.2)
Total Protein: 6.4 g/dL (ref 6.0–8.3)

## 2014-08-03 LAB — URINALYSIS, MICROSCOPIC ONLY
Bacteria, UA: NONE SEEN
CRYSTALS: NONE SEEN
Casts: NONE SEEN
SQUAMOUS EPITHELIAL / LPF: NONE SEEN

## 2014-08-03 LAB — MAGNESIUM: Magnesium: 2 mg/dL (ref 1.5–2.5)

## 2014-08-03 LAB — BASIC METABOLIC PANEL WITH GFR
BUN: 18 mg/dL (ref 6–23)
CO2: 31 mEq/L (ref 19–32)
CREATININE: 0.89 mg/dL (ref 0.50–1.35)
Calcium: 9.6 mg/dL (ref 8.4–10.5)
Chloride: 106 mEq/L (ref 96–112)
GFR, EST NON AFRICAN AMERICAN: 83 mL/min
GLUCOSE: 73 mg/dL (ref 70–99)
POTASSIUM: 4.2 meq/L (ref 3.5–5.3)
Sodium: 140 mEq/L (ref 135–145)

## 2014-08-03 LAB — LIPID PANEL
CHOL/HDL RATIO: 3.4 ratio
Cholesterol: 186 mg/dL (ref 0–200)
HDL: 54 mg/dL (ref >39)
LDL CALC: 114 mg/dL — AB (ref 0–99)
TRIGLYCERIDES: 90 mg/dL (ref <150)
VLDL: 18 mg/dL (ref 0–40)

## 2014-08-03 LAB — MICROALBUMIN / CREATININE URINE RATIO
Creatinine, Urine: 95 mg/dL
Microalb Creat Ratio: 5.3 mg/g (ref 0.0–30.0)
Microalb, Ur: 0.5 mg/dL (ref <2.0)

## 2014-08-03 LAB — VITAMIN D 25 HYDROXY (VIT D DEFICIENCY, FRACTURES): Vit D, 25-Hydroxy: 54 ng/mL (ref 30–100)

## 2014-08-03 LAB — INSULIN, FASTING: Insulin fasting, serum: 13.6 u[IU]/mL (ref 2.0–19.6)

## 2014-08-03 LAB — TSH: TSH: 1.555 u[IU]/mL (ref 0.350–4.500)

## 2014-08-03 LAB — HEMOGLOBIN A1C
Hgb A1c MFr Bld: 6.1 % — ABNORMAL HIGH (ref <5.7)
Mean Plasma Glucose: 128 mg/dL — ABNORMAL HIGH (ref <117)

## 2014-08-03 LAB — PSA: PSA: 1.29 ng/mL (ref <=4.00)

## 2014-08-14 ENCOUNTER — Ambulatory Visit (INDEPENDENT_AMBULATORY_CARE_PROVIDER_SITE_OTHER): Payer: Medicare Other | Admitting: Cardiology

## 2014-08-14 ENCOUNTER — Encounter: Payer: Self-pay | Admitting: Cardiology

## 2014-08-14 VITALS — BP 138/72 | HR 68 | Ht 70.5 in | Wt 190.9 lb

## 2014-08-14 DIAGNOSIS — F17200 Nicotine dependence, unspecified, uncomplicated: Secondary | ICD-10-CM

## 2014-08-14 DIAGNOSIS — I2510 Atherosclerotic heart disease of native coronary artery without angina pectoris: Secondary | ICD-10-CM

## 2014-08-14 DIAGNOSIS — Z79899 Other long term (current) drug therapy: Secondary | ICD-10-CM | POA: Diagnosis not present

## 2014-08-14 DIAGNOSIS — E785 Hyperlipidemia, unspecified: Secondary | ICD-10-CM

## 2014-08-14 DIAGNOSIS — Z9861 Coronary angioplasty status: Secondary | ICD-10-CM | POA: Diagnosis not present

## 2014-08-14 DIAGNOSIS — I214 Non-ST elevation (NSTEMI) myocardial infarction: Secondary | ICD-10-CM | POA: Diagnosis not present

## 2014-08-14 DIAGNOSIS — I10 Essential (primary) hypertension: Secondary | ICD-10-CM | POA: Diagnosis not present

## 2014-08-14 NOTE — Patient Instructions (Signed)
LABS IN 56MONTH CMP- LIPID--will send you labslip in the mail   [place] wants you to follow-up in 6 months Dr Ellyn Hack.  You will receive a reminder letter in the mail two months in advance. If you don't receive a letter, please call our office to schedule the follow-up appointment.;

## 2014-08-16 ENCOUNTER — Encounter: Payer: Self-pay | Admitting: Cardiology

## 2014-08-16 NOTE — Assessment & Plan Note (Signed)
Patient is on ACE inhibitor, should have routine chemistries checked for renal function and electrolytes. Is on Zetia, therefore checking lipid panel.

## 2014-08-16 NOTE — Assessment & Plan Note (Signed)
He is just not interested in stopping his pipe. He only smokes a couple times a day.

## 2014-08-16 NOTE — Assessment & Plan Note (Signed)
He remained stable. We backed off his aspirin to every other day for his bruising. I told he could probably just stop it. He continues to be on Plavix. He is not on a beta blocker because of history of fatigue and conduction abnormalities. He is on an ACE inhibitor, but has not been on statin.

## 2014-08-16 NOTE — Progress Notes (Signed)
Patient ID: David [name], male   DOB: 11/23/1935, 79 y.o.   MRN: [id number] PCP: Alesia Richards, MD  Clinic Note: Chief Complaint  Patient presents with  . 6 month visit    no chest pain , no sob , no edema   HPI: David [name] is a 79 y.o. male with a PMH below who presents today for 1 followupof his coronary artery disease.  As you recall, he is a pleasant retired Programme researcher, broadcasting/film/video from [employer] s/p NSTEMI in June of 2011with PCI to the LAD & moderate residual CAD.  He had a relook catheterization in February 2012 identified a moderate lesion proximal to the LAD stent. This was evaluated with an FFR that was 0.82. The plan was for medical management and he has done well since.   Interval History: he notes that he is still doing well. He still doesn't do much walking but is active out and about. No plan and routine exercise.  In the fall & winter, he still splits wood, and in the spring & summer does gardening. He still notes that he gets a bit short of breath with more vigorous activities, but is able to do routine activities without problems.  He denies any chest pain or shortness of breath associated with this activity. He has been able to maintain a stable weight. He had an episode of acute tubular oriented and continued to have discomfort in the right side of his chest that was intermittent but nothing significant.  He denies any PND, orthopnea or edema. He still has occasional positional dizziness. He continues to smoke his pipe on a daily basis, and is not willing to give up this 1 vice.  The remainder of cardiac review of systems is as follows: Cardiovascular ROS: no chest pain or dyspnea on exertion negative for - edema, irregular heartbeat, loss of consciousness, murmur, orthopnea, palpitations, paroxysmal nocturnal dyspnea or rapid heart rate, syncope/near syncope, TIA/amaurosis fugax.  He does note easy bruising, but denies any significant medical hematochezia and  hematuria or epistaxis.   Past Medical History  Diagnosis Date  . Non-ST elevation MI (NSTEMI) June 2011    PCI to LAD  . CAD S/P percutaneous coronary angioplasty 12/2009; 08/2010    a) 6/'22 - NSTEMI: PCI to LAD: Promus Element 2.5 mm x 15 mm DES; b) Cath for Angina: Prox LAD 60-70% pre-stent with FFR 0.82, 90% RVM -- Med Rx, EF 50-55%  . Essential hypertension   . Hyperlipidemia with target LDL less than 70     Statin intolerance  . Type 2 diabetes mellitus without complications   . BPH (benign prostatic hyperplasia)    Prior Cardiac Evaluation and Past Surgical History: Reviewed. Pertinent data in [place]  Current Outpatient Prescriptions on File Prior to Visit  Medication Sig Dispense Refill  . aspirin EC 81 MG tablet Take 1 tablet (81 mg total) by mouth every other day. 30 tablet 11  . cholecalciferol (VITAMIN D) 1000 UNITS tablet Take 2,000 Units by mouth daily.     Marland Kitchen CINNAMON PO Take 1,000 mg by mouth 2 (two) times daily.    . clopidogrel (PLAVIX) 75 MG tablet Take 1 tablet (75 mg total) by mouth daily. 90 tablet 3  . doxazosin (CARDURA) 4 MG tablet TAKE 1 TABLET BY MOUTH AT BEDTIME 90 tablet 3  . finasteride (PROSCAR) 5 MG tablet Take 5 mg by mouth daily.    Marland Kitchen lisinopril (PRINIVIL,ZESTRIL) 5 MG tablet TAKE 1 TABLET BY MOUTH DAILY.  90 tablet 3  . Magnesium 500 MG CAPS Take 1 capsule by mouth daily.    . metFORMIN (GLUCOPHAGE-XR) 500 MG 24 hr tablet TAKE 2 TABLETS BY MOUTH TWICE DAILY 360 tablet 2  . ZETIA 10 MG tablet TAKE 1 TABLET BY MOUTH EVERY DAY 90 tablet 1   No current facility-administered medications on file prior to visit.   Allergies  Allergen Reactions  . Statins Other (See Comments)    Myalgias and cramping Both Crestor and Lipitor  . Tetanus Toxoids    No Changes in Social and Family History: Reviewed in Epic  ROS: A comprehensive Review of Systems was performed: Review of Systems  Constitutional: Negative for malaise/fatigue and diaphoresis.  HENT: Negative  for nosebleeds.   Cardiovascular: Negative for claudication.  Gastrointestinal: Negative for blood in stool and melena.  Genitourinary: Negative for hematuria.  Musculoskeletal: Positive for back pain and joint pain. Negative for myalgias.  Neurological: Negative for dizziness, focal weakness, loss of consciousness and weakness.  Endo/Heme/Allergies: Bruises/bleeds easily.  Psychiatric/Behavioral: Negative for depression and memory loss. The patient does not have insomnia.     Wt Readings from Last 3 Encounters:  08/14/14 190 lb 14.4 oz (86.592 kg)  08/02/14 189 lb (85.73 kg)  06/23/14 188 lb (85.276 kg)    PHYSICAL EXAM BP 138/72 mmHg  Pulse 68  Ht 5' 10.5" (1.791 m)  Wt 190 lb 14.4 oz (86.592 kg)  BMI 27.00 kg/m2 --  General appearance: A&O, cooperative, appears stated age, no distress and healthy-appearing, well-nourished and well-groomed.  Answers questions appropriately. HEENT: [place]/AT, EOMI, MMM, anicteric sclera Neck: no adenopathy, no carotid bruit, no JVD, supple, symmetrical, trachea midline and thyroid not enlarged, symmetric, no tenderness/mass/nodules Lungs: CTAB, normal percussion bilaterally and nonlabored, good air movement. No W./R../R. Heart: RRR, S1, S2 normal, no murmur, click, rub or gallop and normal apical impulse Abdomen: soft, non-tender; bowel sounds normal; no masses,  no organomegaly Extremities: extremities normal, atraumatic, no cyanosis or edema; no significant rash  Pulses: 2+ and symmetric Neurologic: normal strength and tone. Normal symmetric reflexes. Normal coordination and gait  IPJ:[id number] today: Yes Rate: 70, Rhythm: NSR, 1 AVB (212), PVCx, Non-specific IVCD, NSST changes;  No change from PCP ECG . From 01/2014 - IVCD noted.  Recent Labs:  Reviewed.  ASSESSMENT / PLAN:  Non-ST elevated myocardial infarction (non-STEMI) No recurrent ACS-type symptoms. He is quite familiar with what his anginal symptoms feel like. This may be keeping him  from doing as much walking exercise as he usually would, but the father out from his last cath he gets he is doing more.   CAD S/P percutaneous coronary angioplasty and PCI- DES mid LAD in the setting of non-STEMI He remained stable. We backed off his aspirin to every other day for his bruising. I told he could probably just stop it. He continues to be on Plavix. He is not on a beta blocker because of history of fatigue and conduction abnormalities. He is on an ACE inhibitor, but has not been on statin.   Hyperlipidemia with statin intolerance His labs actually been pretty well controlled on Zetia alone. Lab Results  Component Value Date   CHOL 186 08/02/2014   HDL 54 08/02/2014   LDLCALC 114* 08/02/2014   TRIG 90 08/02/2014   CHOLHDL 3.4 08/02/2014   This most recent lab is an anomaly. I plan to have these labs rechecked in about 6 months. He is planning to get back into his better dietary habits and cutting  out foods he knows he should not have eaten. He thinks this may be indicative of his holiday eating   Essential hypertension Well-controlled on ACE inhibitor.   Tobacco dependency He is just not interested in stopping his pipe. He only smokes a couple times a day.   Medication management Patient is on ACE inhibitor, should have routine chemistries checked for renal function and electrolytes. Is on Zetia, therefore checking lipid panel.    Orders Placed This Encounter  Procedures  . Comprehensive metabolic panel    Standing Status: Future     Number of Occurrences:      Standing Expiration Date: 08/15/2015    Order Specific Question:  Has the patient fasted?    Answer:  Yes  . Lipid panel    Standing Status: Future     Number of Occurrences:      Standing Expiration Date: 08/15/2015    Order Specific Question:  Has the patient fasted?    Answer:  Yes   Followup: Six-month   HARDING, Leonie Green, M.D., M.S. Interventional Cardiologist   Pager #  [phone]

## 2014-08-16 NOTE — Assessment & Plan Note (Signed)
Well-controlled on ACE inhibitor. 

## 2014-08-16 NOTE — Assessment & Plan Note (Signed)
No recurrent ACS-type symptoms. He is quite familiar with what his anginal symptoms feel like. This may be keeping him from doing as much walking exercise as he usually would, but the father out from his last cath he gets he is doing more.

## 2014-08-16 NOTE — Assessment & Plan Note (Addendum)
His labs actually been pretty well controlled on Zetia alone. Lab Results  Component Value Date   CHOL 186 08/02/2014   HDL 54 08/02/2014   LDLCALC 114* 08/02/2014   TRIG 90 08/02/2014   CHOLHDL 3.4 08/02/2014   This most recent lab is an anomaly. I plan to have these labs rechecked in about 6 months. He is planning to get back into his better dietary habits and cutting out foods he knows he should not have eaten. He thinks this may be indicative of his holiday eating

## 2014-08-28 ENCOUNTER — Other Ambulatory Visit: Payer: Self-pay | Admitting: Emergency Medicine

## 2014-09-26 ENCOUNTER — Other Ambulatory Visit: Payer: Self-pay | Admitting: Physician Assistant

## 2014-09-26 DIAGNOSIS — L720 Epidermal cyst: Secondary | ICD-10-CM | POA: Diagnosis not present

## 2014-09-26 DIAGNOSIS — D239 Other benign neoplasm of skin, unspecified: Secondary | ICD-10-CM | POA: Diagnosis not present

## 2014-09-26 DIAGNOSIS — D485 Neoplasm of uncertain behavior of skin: Secondary | ICD-10-CM | POA: Diagnosis not present

## 2014-10-03 DIAGNOSIS — Z4802 Encounter for removal of sutures: Secondary | ICD-10-CM | POA: Diagnosis not present

## 2014-10-20 ENCOUNTER — Encounter: Payer: Self-pay | Admitting: Internal Medicine

## 2014-11-03 DIAGNOSIS — S63501A Unspecified sprain of right wrist, initial encounter: Secondary | ICD-10-CM | POA: Diagnosis not present

## 2014-11-04 ENCOUNTER — Encounter: Payer: Self-pay | Admitting: *Deleted

## 2014-11-06 ENCOUNTER — Other Ambulatory Visit: Payer: Self-pay | Admitting: Physician Assistant

## 2014-11-15 DIAGNOSIS — H40053 Ocular hypertension, bilateral: Secondary | ICD-10-CM | POA: Diagnosis not present

## 2014-11-15 DIAGNOSIS — E119 Type 2 diabetes mellitus without complications: Secondary | ICD-10-CM | POA: Diagnosis not present

## 2014-11-15 DIAGNOSIS — H2513 Age-related nuclear cataract, bilateral: Secondary | ICD-10-CM | POA: Diagnosis not present

## 2014-11-15 DIAGNOSIS — H40013 Open angle with borderline findings, low risk, bilateral: Secondary | ICD-10-CM | POA: Diagnosis not present

## 2014-11-16 ENCOUNTER — Ambulatory Visit (INDEPENDENT_AMBULATORY_CARE_PROVIDER_SITE_OTHER): Payer: Medicare Other | Admitting: Physician Assistant

## 2014-11-16 ENCOUNTER — Encounter: Payer: Self-pay | Admitting: Physician Assistant

## 2014-11-16 VITALS — BP 112/68 | HR 60 | Temp 97.7°F | Resp 16 | Ht 71.0 in | Wt 187.0 lb

## 2014-11-16 DIAGNOSIS — E559 Vitamin D deficiency, unspecified: Secondary | ICD-10-CM

## 2014-11-16 DIAGNOSIS — Z9861 Coronary angioplasty status: Secondary | ICD-10-CM

## 2014-11-16 DIAGNOSIS — Z79899 Other long term (current) drug therapy: Secondary | ICD-10-CM

## 2014-11-16 DIAGNOSIS — Z1331 Encounter for screening for depression: Secondary | ICD-10-CM

## 2014-11-16 DIAGNOSIS — R6889 Other general symptoms and signs: Secondary | ICD-10-CM | POA: Diagnosis not present

## 2014-11-16 DIAGNOSIS — Z9181 History of falling: Secondary | ICD-10-CM

## 2014-11-16 DIAGNOSIS — E663 Overweight: Secondary | ICD-10-CM

## 2014-11-16 DIAGNOSIS — I10 Essential (primary) hypertension: Secondary | ICD-10-CM | POA: Diagnosis not present

## 2014-11-16 DIAGNOSIS — E785 Hyperlipidemia, unspecified: Secondary | ICD-10-CM | POA: Diagnosis not present

## 2014-11-16 DIAGNOSIS — Z0000 Encounter for general adult medical examination without abnormal findings: Secondary | ICD-10-CM

## 2014-11-16 DIAGNOSIS — E1121 Type 2 diabetes mellitus with diabetic nephropathy: Secondary | ICD-10-CM | POA: Diagnosis not present

## 2014-11-16 DIAGNOSIS — F17200 Nicotine dependence, unspecified, uncomplicated: Secondary | ICD-10-CM

## 2014-11-16 DIAGNOSIS — N400 Enlarged prostate without lower urinary tract symptoms: Secondary | ICD-10-CM

## 2014-11-16 DIAGNOSIS — Z0001 Encounter for general adult medical examination with abnormal findings: Secondary | ICD-10-CM

## 2014-11-16 DIAGNOSIS — E1129 Type 2 diabetes mellitus with other diabetic kidney complication: Secondary | ICD-10-CM | POA: Diagnosis not present

## 2014-11-16 DIAGNOSIS — I2510 Atherosclerotic heart disease of native coronary artery without angina pectoris: Secondary | ICD-10-CM

## 2014-11-16 LAB — HEPATIC FUNCTION PANEL
ALBUMIN: 4.1 g/dL (ref 3.5–5.2)
ALT: 17 U/L (ref 0–53)
AST: 16 U/L (ref 0–37)
Alkaline Phosphatase: 60 U/L (ref 39–117)
Bilirubin, Direct: 0.1 mg/dL (ref 0.0–0.3)
Indirect Bilirubin: 0.5 mg/dL (ref 0.2–1.2)
Total Bilirubin: 0.6 mg/dL (ref 0.2–1.2)
Total Protein: 6.9 g/dL (ref 6.0–8.3)

## 2014-11-16 LAB — CBC WITH DIFFERENTIAL/PLATELET
BASOS ABS: 0 10*3/uL (ref 0.0–0.1)
BASOS PCT: 0 % (ref 0–1)
EOS ABS: 0.3 10*3/uL (ref 0.0–0.7)
EOS PCT: 4 % (ref 0–5)
HCT: 46.3 % (ref 39.0–52.0)
Hemoglobin: 15.7 g/dL (ref 13.0–17.0)
Lymphocytes Relative: 20 % (ref 12–46)
Lymphs Abs: 1.7 10*3/uL (ref 0.7–4.0)
MCH: 31.5 pg (ref 26.0–34.0)
MCHC: 33.9 g/dL (ref 30.0–36.0)
MCV: 92.8 fL (ref 78.0–100.0)
MONOS PCT: 8 % (ref 3–12)
MPV: 10 fL (ref 8.6–12.4)
Monocytes Absolute: 0.7 10*3/uL (ref 0.1–1.0)
Neutro Abs: 5.9 10*3/uL (ref 1.7–7.7)
Neutrophils Relative %: 68 % (ref 43–77)
PLATELETS: 241 10*3/uL (ref 150–400)
RBC: 4.99 MIL/uL (ref 4.22–5.81)
RDW: 13.5 % (ref 11.5–15.5)
WBC: 8.7 10*3/uL (ref 4.0–10.5)

## 2014-11-16 LAB — BASIC METABOLIC PANEL WITH GFR
BUN: 15 mg/dL (ref 6–23)
CALCIUM: 9.6 mg/dL (ref 8.4–10.5)
CO2: 26 mEq/L (ref 19–32)
Chloride: 104 mEq/L (ref 96–112)
Creat: 0.84 mg/dL (ref 0.50–1.35)
GFR, Est Non African American: 85 mL/min
GLUCOSE: 92 mg/dL (ref 70–99)
Potassium: 4.2 mEq/L (ref 3.5–5.3)
Sodium: 140 mEq/L (ref 135–145)

## 2014-11-16 LAB — LIPID PANEL
CHOLESTEROL: 188 mg/dL (ref 0–200)
HDL: 52 mg/dL (ref >=40)
LDL Cholesterol: 110 mg/dL — ABNORMAL HIGH (ref 0–99)
TRIGLYCERIDES: 131 mg/dL (ref <150)
Total CHOL/HDL Ratio: 3.6 Ratio
VLDL: 26 mg/dL (ref 0–40)

## 2014-11-16 LAB — MAGNESIUM: Magnesium: 2.2 mg/dL (ref 1.5–2.5)

## 2014-11-16 LAB — HEMOGLOBIN A1C
HEMOGLOBIN A1C: 6.1 % — AB (ref <5.7)
Mean Plasma Glucose: 128 mg/dL — ABNORMAL HIGH (ref <117)

## 2014-11-16 LAB — TSH: TSH: 1.698 u[IU]/mL (ref 0.350–4.500)

## 2014-11-16 NOTE — Patient Instructions (Signed)
Recommendations For Diabetic/Prediabetic Patients:   -  Take medications as prescribed  -  Recommend Dr Fara Olden Fuhrman's book "The End of Diabetes "  And "The End of Dieting"- Can get at  www.[email or web address].com and encourage also get the Audio CD book  - AVOID Animal products, ie. Meat - red/white, Poultry and Dairy/especially cheese - Exercise at least 5 times a week for 30 minutes or preferably daily.  - No Smoking - Drink less than 2 drinks a day.  - Monitor your feet for sores - Have yearly Eye Exams - Recommend annual Flu vaccine  - Recommend Pneumovax and Prevnar vaccines - Shingles Vaccine (Zostavax) if over 49 y.o.  Goals:   - BMI less than 24 - Fasting sugar less than 130 or less than 150 if tapering medicines to lose weight  - Systolic BP less than 354  - Diastolic BP less than 80 - Bad LDL Cholesterol less than 70 - Triglycerides less than 150   Before you even begin to attack a weight-loss plan, it pays to remember this: You are not fat. You have fat. Losing weight isn't about blame or shame; it's simply another achievement to accomplish. Dieting is like any other skill-you have to buckle down and work at it. As long as you act in a smart, reasonable way, you'll ultimately get where you want to be. Here are some weight loss pearls for you.  1. It's Not a Diet. It's a Lifestyle Thinking of a diet as something you're on and suffering through only for the short term doesn't work. To shed weight and keep it off, you need to make permanent changes to the way you eat. It's OK to indulge occasionally, of course, but if you cut calories temporarily and then revert to your old way of eating, you'll gain back the weight quicker than you can say yo-yo. Use it to lose it. Research shows that one of the best predictors of long-term weight loss is how many pounds you drop in the first month. For that reason, nutritionists often suggest being stricter for the first two weeks of your new eating strategy  to build momentum. Cut out added sugar and alcohol and avoid unrefined carbs. After that, figure out how you can reincorporate them in a way that's healthy and maintainable.  2. There's a Right Way to Exercise Working out burns calories and fat and boosts your metabolism by building muscle. But those trying to lose weight are notorious for overestimating the number of calories they burn and underestimating the amount they take in. Unfortunately, your system is biologically programmed to hold on to extra pounds and that means when you start exercising, your body senses the deficit and ramps up its hunger signals. If you're not diligent, you'll eat everything you burn and then some. Use it to lose it. Cardio gets all the exercise glory, but strength and interval training are the real heroes. They help you build lean muscle, which in turn increases your metabolism and calorie-burning ability 3. Don't Overreact to Mild Hunger Some people have a hard time losing weight because of hunger anxiety. To them, being hungry is bad-something to be avoided at all costs-so they carry snacks with them and eat when they don't need to. Others eat because they're stressed out or bored. While you never want to get to the point of being ravenous (that's when bingeing is likely to happen), a hunger pang, a craving, or the fact that it's 3:00 p.m. should not send  you racing for the vending machine or obsessing about the energy bar in your purse. Ideally, you should put off eating until your stomach is growling and it's difficult to concentrate.  Use it to lose it. When you feel the urge to eat, use the HALT method. Ask yourself, Am I really hungry? Or am I angry or anxious, lonely or bored, or tired? If you're still not certain, try the apple test. If you're truly hungry, an apple should seem delicious; if it doesn't, something else is going on. Or you can try drinking water and making yourself busy, if you are still hungry try a  healthy snack.  4. Not All Calories Are Created Equal The mechanics of weight loss are pretty simple: Take in fewer calories than you use for energy. But the kind of food you eat makes all the difference. Processed food that's high in saturated fat and refined starch or sugar can cause inflammation that disrupts the hormone signals that tell your brain you're full. The result: You eat a lot more.  Use it to lose it. Clean up your diet. Swap in whole, unprocessed foods, including vegetables, lean protein, and healthy fats that will fill you up and give you the biggest nutritional bang for your calorie buck. In a few weeks, as your brain starts receiving regular hunger and fullness signals once again, you'll notice that you feel less hungry overall and naturally start cutting back on the amount you eat.  5. Protein, Produce, and Plant-Based Fats Are Your Weight-Loss Trinity Here's why eating the three Ps regularly will help you drop pounds. Protein fills you up. You need it to build lean muscle, which keeps your metabolism humming so that you can torch more fat. People in a weight-loss program who ate double the recommended daily allowance for protein (about 110 grams for a 150-pound woman) lost 70 percent of their weight from fat, while people who ate the RDA lost only about 40 percent, one study found. Produce is packed with filling fiber. "It's very difficult to consume too many calories if you're eating a lot of vegetables. Example: Three cups of broccoli is a lot of food, yet only 93 calories. (Fruit is another story. It can be easy to overeat and can contain a lot of calories from sugar, so be sure to monitor your intake.) Plant-based fats like olive oil and those in avocados and nuts are healthy and extra satiating.  Use it to lose it. Aim to incorporate each of the three Ps into every meal and snack. People who eat protein throughout the day are able to keep weight off, according to a study in the  [place] of Clinical Nutrition. In addition to meat, poultry and seafood, good sources are beans, lentils, eggs, tofu, and yogurt. As for fat, keep portion sizes in check by measuring out salad dressing, oil, and nut butters (shoot for one to two tablespoons). Finally, eat veggies or a little fruit at every meal. People who did that consumed 308 fewer calories but didn't feel any hungrier than when they didn't eat more produce.  7. How You Eat Is As Important As What You Eat In order for your brain to register that you're full, you need to focus on what you're eating. Sit down whenever you eat, preferably at a table. Turn off the TV or computer, put down your phone, and look at your food. Smell it. Chew slowly, and don't put another bite on your fork until you swallow.  When women ate lunch this attentively, they consumed 30 percent less when snacking later than those who listened to an audiobook at lunchtime, according to a study in the [place] of Nutrition. 8. Weighing Yourself Really Works The scale provides the best evidence about whether your efforts are paying off. Seeing the numbers tick up or down or stagnate is motivation to keep going-or to rethink your approach. A 2015 study at [facility] found that daily weigh-ins helped people lose more weight, keep it off, and maintain that loss, even after two years. Use it to lose it. Step on the scale at the same time every day for the best results. If your weight shoots up several pounds from one weigh-in to the next, don't freak out. Eating a lot of salt the night before or having your period is the likely culprit. The number should return to normal in a day or two. It's a steady climb that you need to do something about. 9. Too Much Stress and Too Little Sleep Are Your Enemies When you're tired and frazzled, your body cranks up the production of cortisol, the stress hormone that can cause carb cravings. Not getting enough sleep also  boosts your levels of ghrelin, a hormone associated with hunger, while suppressing leptin, a hormone that signals fullness and satiety. People on a diet who slept only five and a half hours a night for two weeks lost 55 percent less fat and were hungrier than those who slept eight and a half hours, according to a study in the [place]. Use it to lose it. Prioritize sleep, aiming for seven hours or more a night, which research shows helps lower stress. And make sure you're getting quality zzz's. If a snoring spouse or a fidgety cat wakes you up frequently throughout the night, you may end up getting the equivalent of just four hours of sleep, according to a study from [facility]. Keep pets out of the bedroom, and use a white-noise app to drown out snoring. 10. You Will Hit a plateau-And You Can Bust Through It As you slim down, your body releases much less leptin, the fullness hormone.  If you're not strength training, start right now. Building muscle can raise your metabolism to help you overcome a plateau. To keep your body challenged and burning calories, incorporate new moves and more intense intervals into your workouts or add another sweat session to your weekly routine. Alternatively, cut an extra 100 calories or so a day from your diet. Now that you've lost weight, your body simply doesn't need as much fuel.

## 2014-11-16 NOTE — Progress Notes (Signed)
MEDICARE ANNUAL WELLNESS VISIT AND FOLLOW UP Assessment:   1. Essential hypertension - continue medications, DASH diet, exercise and monitor at home. Call if greater than 130/80.  - CBC with Differential/Platelet - BASIC METABOLIC PANEL WITH GFR - Hepatic function panel - TSH  2. Type II diabetes mellitus with nephropathy Discussed general issues about diabetes pathophysiology and management., Educational material distributed., Suggested low cholesterol diet., Encouraged aerobic exercise., Discussed foot care., Reminded to get yearly retinal exam. - Hemoglobin A1c - HM DIABETES FOOT EXAM  3. Hyperlipidemia with statin intolerance -continue medications, check lipids, decrease fatty foods, increase activity.  Goal less than 70, intolerant to statins, continue zetia-? Benefit from praulent.  - Lipid panel  4. Tobacco dependency Advised to stop smoking, understands risk of MI, stroke, cancer, and death  5. Vitamin D deficiency Continue supplement  6. Medication management - Magnesium  7. CAD S/P percutaneous coronary angioplasty and PCI- DES mid LAD in the setting of non-STEMI Control blood pressure, cholesterol, glucose, increase exercise.  Stop smoking pipes Continue cardio follow up  8. BPH (benign prostatic hyperplasia) Continue medications  9. Depression screen negative  10. At low risk for fall Discussed fall prevention  11. Routine general medical examination at a health care facility  12. Overweight (BMI 25.0-29.9) Continue weight loss/healthy eating  Over 30 minutes of exam, counseling, chart review, and critical decision making was performed  Plan:   During the course of the visit the patient was educated and counseled about appropriate screening and preventive services including:    Pneumococcal vaccine   Influenza vaccine  Td vaccine  Screening electrocardiogram  Colorectal cancer screening  Diabetes screening  Glaucoma screening  Nutrition  counseling   Conditions/risks identified: BMI: Discussed weight loss, diet, and increase physical activity.  Increase physical activity: AHA recommends 150 minutes of physical activity a week.  Medications reviewed Diabetes is at goal, ACE/ARB therapy: Yes. Urinary Incontinence is not an issue: discussed non pharmacology and pharmacology options.  Fall risk: low- discussed PT, home fall assessment, medications.    Subjective:  David [name] is a 79 y.o. male who presents for Medicare Annual Wellness Visit and 3 month follow up for HTN, hyperlipidemia, prediabetes, and vitamin D Def.  Date of last medicare wellness visit was 09/01/2013  His blood pressure has been controlled at home, today their BP is BP: 112/68 mmHg He does not workout but works in the yard and does split wood. He denies chest pain, shortness of breath, dizziness.  He has a history of ASHD S/P DES stent in 2011. He is on ASA and Plavix.  Denies chest pain, claudication, dyspnea and irregular heart beat.  He is on cholesterol medication, zetia due to intolerance to statins and denies myalgias. His cholesterol is not at goal, less than 70. The cholesterol last visit was:   Lab Results  Component Value Date   CHOL 186 08/02/2014   HDL 54 08/02/2014   LDLCALC 114* 08/02/2014   TRIG 90 08/02/2014   CHOLHDL 3.4 08/02/2014   He has been working on diet and exercise for diabetes x 2010 but with diet and metformin he is in the preDM range,  and denies paresthesia of the feet, polydipsia, polyuria and visual disturbances. Last A1C in the office was:  Lab Results  Component Value Date   HGBA1C 6.1* 08/02/2014  Patient is on Vitamin D supplement.   Lab Results  Component Value Date   VD25OH 57 08/02/2014  He has BPH, on cardura and  proscar, follows with Dr. Hartley Barefoot.   He has a history of spinal stenosis and has seen Dr. Vertell Limber in the past.   BMI is Body mass index is 26.09 kg/(m^2)., he is working on diet and  exercise. Wt Readings from Last 3 Encounters:  11/16/14 187 lb (84.823 kg)  08/14/14 190 lb 14.4 oz (86.592 kg)  08/02/14 189 lb (85.73 kg)    Medication Review: Current Outpatient Prescriptions on File Prior to Visit  Medication Sig Dispense Refill  . aspirin EC 81 MG tablet Take 1 tablet (81 mg total) by mouth every other day. 30 tablet 11  . cholecalciferol (VITAMIN D) 1000 UNITS tablet Take 2,000 Units by mouth daily.     Marland Kitchen CINNAMON PO Take 1,000 mg by mouth 2 (two) times daily.    . clopidogrel (PLAVIX) 75 MG tablet Take 1 tablet (75 mg total) by mouth daily. 90 tablet 3  . doxazosin (CARDURA) 4 MG tablet TAKE 1 TABLET BY MOUTH AT BEDTIME 90 tablet 3  . finasteride (PROSCAR) 5 MG tablet Take 5 mg by mouth daily.    Marland Kitchen lisinopril (PRINIVIL,ZESTRIL) 5 MG tablet TAKE 1 TABLET BY MOUTH DAILY. 90 tablet 3  . Magnesium 500 MG CAPS Take 1 capsule by mouth daily.    . metFORMIN (GLUCOPHAGE-XR) 500 MG 24 hr tablet TAKE 2 TABLETS BY MOUTH TWICE DAILY 360 tablet 2  . ZETIA 10 MG tablet TAKE 1 TABLET BY MOUTH EVERY DAY 90 tablet 1   No current facility-administered medications on file prior to visit.    Current Problems (verified) Patient Active Problem List   Diagnosis Date Noted  . Vitamin D deficiency 12/25/2013  . Medication management 12/25/2013  . Obesity 02/23/2013    Class: Chronic  . Tobacco dependency 02/22/2013  . Hyperlipidemia with statin intolerance   . Essential hypertension   . Type II diabetes mellitus with nephropathy   . Non-ST elevated myocardial infarction (non-STEMI) 01/17/2010  . CAD S/P percutaneous coronary angioplasty and PCI- DES mid LAD in the setting of non-STEMI 12/31/2009    Screening Tests Immunization History  Administered Date(s) Administered  . Influenza, High Dose Seasonal PF 06/01/2013, 04/18/2014  . Influenza-Unspecified 05/13/2011  . Pneumococcal Conjugate-13 04/18/2014  . Pneumococcal Polysaccharide-23 07/21/2002  . Td 07/21/2000    Preventative care: Last colonoscopy: 05/2014, repeat 3 years  Prior vaccinations: TD or Tdap: ALLERGY  Influenza: 2015 Pneumococcal: 2004 Prevnar13: 2015 Shingles/Zostavax: DUE  Names of Other Physician/Practitioners you currently use: 1. [facility] here for primary care 2. Dr. Pandora Leiter, wears glasses, eye doctor, last visit 10/2014 (yesterday)- getting evaluated for cataracts 3. Dr. Joannie Springs, [facility], dentist, last visit Dec 2015, goes 2 times a year Patient Care Team: [name], MD as PCP - General (Internal Medicine) Laurence Spates, MD as Consulting Physician (Gastroenterology) Leonie Man, MD as Consulting Physician (Cardiology) Erline Levine, MD as Consulting Physician (Neurosurgery) Carolan Clines, MD as Consulting Physician (Urology)  Past Surgical History  Procedure Laterality Date  . Cardiac catheterization  12/2009    Proximal LAD stenosis followed by a significant 80-90% distal stenosis  . Coronary angioplasty  12/2009    PTCA to proximal LAD; PCI with Promus Element DES stent  2.5 mm x 15 mm  distalLAD - .for non-ST elevation MI  . Cardiac catheterization  February 2012     90% ostial RV marginal branch; 60-70% proximal LAD with widely patent distal stent. FFR 0.82; medical therapy  . Vasectomy    .  Transthoracic echocardiogram  June 2011    Moderately dilated LV; moderate hypokinesis of anteroseptal and anterior wall consistent with MI. Grade 1 diastolic dysfunction. Mild to moderately dilated LA   Family History  Problem Relation Age of Onset  . Cancer Father     lung  . Stroke Father   . Cancer Sister     breast  . Cancer Brother     lung  . Cancer Son     history of prostate   History  Substance Use Topics  . Smoking status: Current Every Day Smoker -- 60 years    Types: Pipe  . Smokeless tobacco: Never Used     Comment: smokes pipe qd  . Alcohol Use: 4.2 oz/week    7 Standard drinks or  equivalent per week     Comment: drinks 4 ounces of vodka daily    MEDICARE WELLNESS OBJECTIVES: Tobacco use: He does smoke, pipe.   Alcohol Current alcohol use: social drinker Caffeine Current caffeine use: coffee 4 /day Diet: in general, a "healthy" diet   Physical activity: yard work Fall risk:  Low Risk Osteoporosis: dietary calcium and/or vitamin D deficiency, History of fracture in the past year: no Depression/mood screen:  Yes - No Depression Hearing: impaired, wears hearing aids Visual acuity: impaired,  does perform annual eye exam  ADLs: self care Home safety: good Cognitive Testing  Alert? Yes  Normal Appearance?Yes  Oriented to person? Yes  Place? Yes   Time? Yes  Recall of three objects?  Yes  Can perform simple calculations? Yes  Displays appropriate judgment?Yes  Can read the correct time from a watch face?Yes EOL planning: Yes   Objective:   Blood pressure 112/68, pulse 60, temperature 97.7 F (36.5 C), resp. rate 16, height 5\' 11"  (1.803 m), weight 187 lb (84.823 kg). Body mass index is 26.09 kg/(m^2).  General appearance: alert, no distress, WD/WN, male HEENT: normocephalic, sclerae anicteric, TMs pearly, nares patent, no discharge or erythema, pharynx normal Oral cavity: MMM, no lesions Neck: supple, no lymphadenopathy, no thyromegaly, no masses Heart: RRR, normal S1, prominent S2, no murmurs Lungs: CTA bilaterally, no wheezes, rhonchi, or rales Abdomen: +bs, soft, non tender, non distended, no masses, no hepatomegaly, no splenomegaly Musculoskeletal: nontender, no swelling, no obvious deformity Extremities: no edema, no cyanosis, no clubbing Pulses: 2+ symmetric, upper and lower extremities, normal cap refill Neurological: alert, oriented x 3, CN2-12 intact, strength normal upper extremities and lower extremities, sensation normal throughout, DTRs 2+ throughout, no cerebellar signs, gait normal Psychiatric: normal affect, behavior normal, pleasant    Medicare Attestation I have personally reviewed: The patient's medical and social history Their use of alcohol, tobacco or illicit drugs Their current medications and supplements The patient's functional ability including ADLs,fall risks, home safety risks, cognitive, and hearing and visual impairment Diet and physical activities Evidence for depression or mood disorders  The patient's weight, height, BMI, and visual acuity have been recorded in the chart.  I have made referrals, counseling, and provided education to the patient based on review of the above and I have provided the patient with a written personalized care plan for preventive services.     Vicie Mutters, PA-C   11/16/2014

## 2014-12-10 ENCOUNTER — Encounter: Payer: Self-pay | Admitting: *Deleted

## 2014-12-21 DIAGNOSIS — H25013 Cortical age-related cataract, bilateral: Secondary | ICD-10-CM | POA: Diagnosis not present

## 2014-12-21 DIAGNOSIS — H524 Presbyopia: Secondary | ICD-10-CM | POA: Diagnosis not present

## 2014-12-21 DIAGNOSIS — E119 Type 2 diabetes mellitus without complications: Secondary | ICD-10-CM | POA: Diagnosis not present

## 2014-12-21 DIAGNOSIS — H2513 Age-related nuclear cataract, bilateral: Secondary | ICD-10-CM | POA: Diagnosis not present

## 2015-01-22 ENCOUNTER — Encounter: Payer: Self-pay | Admitting: *Deleted

## 2015-01-24 ENCOUNTER — Other Ambulatory Visit: Payer: Self-pay | Admitting: *Deleted

## 2015-01-24 ENCOUNTER — Telehealth: Payer: Self-pay | Admitting: *Deleted

## 2015-01-24 DIAGNOSIS — I10 Essential (primary) hypertension: Secondary | ICD-10-CM

## 2015-01-24 DIAGNOSIS — I214 Non-ST elevation (NSTEMI) myocardial infarction: Secondary | ICD-10-CM

## 2015-01-24 DIAGNOSIS — I2510 Atherosclerotic heart disease of native coronary artery without angina pectoris: Secondary | ICD-10-CM

## 2015-01-24 DIAGNOSIS — Z9861 Coronary angioplasty status: Secondary | ICD-10-CM

## 2015-01-24 DIAGNOSIS — E785 Hyperlipidemia, unspecified: Secondary | ICD-10-CM

## 2015-01-24 NOTE — Telephone Encounter (Signed)
MAIL LETTER AND LAB SLIP FOR CMP,LIPID

## 2015-01-24 NOTE — Telephone Encounter (Signed)
-----   Message from Raiford Simmonds, RN sent at 08/14/2014  3:26 PM EST ----- CMP LIPID-- Allen Kell

## 2015-01-30 DIAGNOSIS — H2512 Age-related nuclear cataract, left eye: Secondary | ICD-10-CM | POA: Diagnosis not present

## 2015-02-16 ENCOUNTER — Ambulatory Visit (INDEPENDENT_AMBULATORY_CARE_PROVIDER_SITE_OTHER): Payer: Medicare Other | Admitting: Cardiology

## 2015-02-16 ENCOUNTER — Encounter: Payer: Self-pay | Admitting: Cardiology

## 2015-02-16 VITALS — BP 106/56 | HR 73 | Ht 71.0 in | Wt 189.0 lb

## 2015-02-16 DIAGNOSIS — I10 Essential (primary) hypertension: Secondary | ICD-10-CM

## 2015-02-16 DIAGNOSIS — F17200 Nicotine dependence, unspecified, uncomplicated: Secondary | ICD-10-CM | POA: Diagnosis not present

## 2015-02-16 DIAGNOSIS — Z9861 Coronary angioplasty status: Secondary | ICD-10-CM | POA: Diagnosis not present

## 2015-02-16 DIAGNOSIS — E785 Hyperlipidemia, unspecified: Secondary | ICD-10-CM | POA: Diagnosis not present

## 2015-02-16 DIAGNOSIS — I2510 Atherosclerotic heart disease of native coronary artery without angina pectoris: Secondary | ICD-10-CM | POA: Diagnosis not present

## 2015-02-16 NOTE — Progress Notes (Signed)
PCP: Alesia Richards, MD  Clinic Note: Chief Complaint  Patient presents with  . Follow-up     no chest pain, no shortness of breath, no edema, no pain in legs, no cramping in legs, no lightheadedness, no dizziness  . Coronary Artery Disease    HPI: David [name] is a 79 y.o. male with a PMH below who presents today for 6 month f/u of CAD-PCI & CRFs. 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 He had a relook catheterization in February 2012 identified a moderate lesion proximal to the LAD stent. This was evaluated with an FFR that was 0.82. The plan was for medical management and he has done well since.  Sabastion was last seen in Jan 2016 & was doing well with no active CAD complaints.  No angina or CHF  Recent Hospitalizations: n/a  Studies Reviewed: n/a  Interval History: Kaushal is doing quite well today. He states over last 6 months, he has not really had much in way of any complaints besides a consistent drop in energy levels compared a few years ago. He just doesn't have the same level of "get up ago he used to have, but does not have any particular focal complaints. He still able to lift weights and a long, just has to stop more frequently and is unable to go as long as he used to.  He remains active, just not doing much to use to. No anginal or heart-type symptoms.  No chest pain or shortness of breath with rest or exertion.  No PND, orthopnea or edema. No palpitations, lightheadedness, dizziness, weakness or syncope/near syncope. No TIA/amaurosis fugax symptoms.   Past Medical History  Diagnosis Date  . Non-ST elevation MI (NSTEMI) June 2011    PCI to LAD  . CAD S/P percutaneous coronary angioplasty 12/2009; 08/2010    a) 6/'22 - NSTEMI: PCI to LAD: Promus Element 2.5 mm x 15 mm DES; b) Cath for Angina: Prox LAD 60-70% pre-stent with FFR 0.82, 90% RVM -- Med Rx, EF 50-55%  . Essential  hypertension   . Hyperlipidemia with target LDL less than 70     Statin intolerance  . Type 2 diabetes mellitus without complications   . BPH (benign prostatic hyperplasia)     Past Surgical History  Procedure Laterality Date  . Cardiac catheterization  12/2009    Proximal LAD stenosis followed by a significant 80-90% distal stenosis  . Coronary angioplasty  12/2009    PTCA to proximal LAD; PCI with Promus Element DES stent  2.5 mm x 15 mm  distalLAD - .for non-ST elevation MI  . Cardiac catheterization  February 2012     90% ostial RV marginal branch; 60-70% proximal LAD with widely patent distal stent. FFR 0.82; medical therapy  . Vasectomy    . Transthoracic echocardiogram  June 2011    - (EF not reported) Moderately dilated LV; moderate hypokinesis of anteroseptal and anterior wall consistent with MI. Grade 1 diastolic dysfunction. Mild to moderately dilated LA    ROS: A comprehensive was performed. Review of Systems  Constitutional: Negative for malaise/fatigue (Just decrease in overall energy levels.).  HENT: Negative for nosebleeds.   Eyes: Negative for blurred vision.  Cardiovascular: Negative for claudication.  Gastrointestinal: Negative for blood in stool and melena.  Genitourinary: Negative for hematuria.  Musculoskeletal: Positive for joint pain. Negative for myalgias and falls.  Neurological: Negative for dizziness,  loss of consciousness and headaches.  Endo/Heme/Allergies: Bruises/bleeds easily (But not bad ).  Psychiatric/Behavioral: Negative for depression.  All other systems reviewed and are negative.   Current Outpatient Prescriptions on File Prior to Visit  Medication Sig Dispense Refill  . aspirin EC 81 MG tablet Take 1 tablet (81 mg total) by mouth every other day. 30 tablet 11  . cholecalciferol (VITAMIN D) 1000 UNITS tablet Take 2,000 Units by mouth daily.     Marland Kitchen CINNAMON PO Take 1,000 mg by mouth 2 (two) times daily.    . clopidogrel (PLAVIX) 75 MG tablet  Take 1 tablet (75 mg total) by mouth daily. 90 tablet 3  . doxazosin (CARDURA) 4 MG tablet TAKE 1 TABLET BY MOUTH AT BEDTIME 90 tablet 3  . finasteride (PROSCAR) 5 MG tablet Take 5 mg by mouth daily.    Marland Kitchen lisinopril (PRINIVIL,ZESTRIL) 5 MG tablet TAKE 1 TABLET BY MOUTH DAILY. 90 tablet 3  . Magnesium 500 MG CAPS Take 1 capsule by mouth daily.    . metFORMIN (GLUCOPHAGE-XR) 500 MG 24 hr tablet TAKE 2 TABLETS BY MOUTH TWICE DAILY 360 tablet 2  . ZETIA 10 MG tablet TAKE 1 TABLET BY MOUTH EVERY DAY 90 tablet 1   No current facility-administered medications on file prior to visit.   Allergies  Allergen Reactions  . Statins Other (See Comments)    Myalgias and cramping Both Crestor and Lipitor  . Tetanus Toxoids     History   Social History  . Marital Status: Married    Spouse Name: N/A  . Number of Children: N/A  . Years of Education: N/A   Occupational History  . Not on file.   Social History Main Topics  . Smoking status: Current Every Day Smoker -- 60 years    Types: Pipe  . Smokeless tobacco: Never Used     Comment: smokes pipe qd  . Alcohol Use: 4.2 oz/week    7 Standard drinks or equivalent per week     Comment: drinks 4 ounces of vodka daily  . Drug Use: No  . Sexual Activity: Not on file   Other Topics Concern  . Not on file   Social History Narrative   He is a married father of 2 with no grandchildren as of yet. He continued to smoke a pipe several times the course of day. He does have an occasional alcoholic beverage. While not involved a standard exercise routine, he is very active with walking and working in the yard, splitting wood and doing aggressive yard work including Production designer, theatre/television/film.   Family History  Problem Relation Age of Onset  . Cancer Father     lung  . Stroke Father   . Cancer Sister     breast  . Cancer Brother     lung  . Cancer Son     history of prostate    Wt Readings from Last 3 Encounters:  02/16/15 85.73 kg (189  lb)  11/16/14 84.823 kg (187 lb)  08/14/14 86.592 kg (190 lb 14.4 oz)    PHYSICAL EXAM BP 106/56 mmHg  Pulse 73  Ht 5\' 11"  (1.803 m)  Wt 85.73 kg (189 lb)  BMI 26.37 kg/m2 General appearance: A&O, cooperative, appears stated age, no distress and healthy-appearing, well-nourished and well-groomed. Answers questions appropriately. HEENT: [place]/AT, EOMI, MMM, anicteric sclera Neck: no adenopathy, no carotid bruit, no JVD, supple, symmetrical, trachea midline and thyroid not enlarged, symmetric, no tenderness/mass/nodules Lungs: CTAB, normal  percussion bilaterally and nonlabored, good air movement. No W./R../R. Heart: RRR, S1, S2 normal, no murmur, click, rub or gallop and normal apical impulse Abdomen: soft, non-tender; bowel sounds normal; no masses, no organomegaly Extremities: extremities normal, atraumatic, no cyanosis or edema; no significant rash  Pulses: 2+ and symmetric Neurologic: normal strength and tone. Normal symmetric reflexes. Normal coordination and gait   Adult ECG Report  Rate: 73 ;  Rhythm: normal sinus rhythm and Left axis deviation/LAFB (-46)  Narrative Interpretation: otherwise normal EKG with no changes. Normal intervals, durations and voltage. He there is poor R wave progression in the precordial leads there is also stable.   Other studies Reviewed: Additional studies/ records that were reviewed today include:  Recent Labs:   Lab Results  Component Value Date   CHOL 188 11/16/2014   HDL 52 11/16/2014   LDLCALC 110* 11/16/2014   TRIG 131 11/16/2014   CHOLHDL 3.6 11/16/2014   Lab Results  Component Value Date   HGBA1C 6.1* 11/16/2014   Lab Results  Component Value Date   CREATININE 0.84 11/16/2014    ASSESSMENT / PLAN: Problem List Items Addressed This Visit    CAD S/P percutaneous coronary angioplasty and PCI- DES mid LAD in the setting of non-STEMI - Primary (Chronic)    Stable without active angina or heart failure symptoms  Improved bruising  would less frequent dosing of aspirin. He really could stop the aspirin, but continues to take it. He is also on Plavix.non-statin do to intolerance. He takes Zetia. He is on ACE inhibitor, but not beta blocker (partly because of fatigue and conduction abnormality).      Relevant Orders   EKG 12-Lead   Essential hypertension (Chronic)    Well-controlled on current dose of ACE inhibitor      Relevant Orders   EKG 12-Lead   Hyperlipidemia with statin intolerance (Chronic)    LDL not quite at goal but last check on Zetia. This is being monitored by PCP. Statin intolerant. Consider the possibility of using a fibrate. Continue dietary Adjustments and exercise.      Tobacco dependency (Chronic)    Continued to smoke his pipe, and is not interested in stopping.         Current medicines are reviewed at length with the patient today. (+/- concerns) n/a The following changes have been made: n/a Other studies ordered:   Orders Placed This Encounter  Procedures  . EKG 12-Lead    no change in medications. One-year followup    HARDING, Leonie Green, M.D., M.S. Interventional Cardiologist   Pager # [phone]

## 2015-02-16 NOTE — Patient Instructions (Signed)
No changes to meds.  Your physician wants you to follow-up in: 1 year with Dr Ellyn Hack. You will receive a reminder letter in the mail two months in advance. If you don't receive a letter, please call our office to schedule the follow-up appointment.

## 2015-02-18 ENCOUNTER — Encounter: Payer: Self-pay | Admitting: Cardiology

## 2015-02-18 NOTE — Assessment & Plan Note (Signed)
LDL not quite at goal but last check on Zetia. This is being monitored by PCP. Statin intolerant. Consider the possibility of using a fibrate. Continue dietary Adjustments and exercise.

## 2015-02-18 NOTE — Assessment & Plan Note (Signed)
Continued to smoke his pipe, and is not interested in stopping.

## 2015-02-18 NOTE — Assessment & Plan Note (Signed)
Well-controlled on current dose of ACE inhibitor. 

## 2015-02-18 NOTE — Assessment & Plan Note (Signed)
Stable without active angina or heart failure symptoms  Improved bruising would less frequent dosing of aspirin. He really could stop the aspirin, but continues to take it. He is also on Plavix.non-statin do to intolerance. He takes Zetia. He is on ACE inhibitor, but not beta blocker (partly because of fatigue and conduction abnormality).

## 2015-02-19 DIAGNOSIS — H25011 Cortical age-related cataract, right eye: Secondary | ICD-10-CM | POA: Diagnosis not present

## 2015-02-19 DIAGNOSIS — H2511 Age-related nuclear cataract, right eye: Secondary | ICD-10-CM | POA: Diagnosis not present

## 2015-02-20 ENCOUNTER — Encounter: Payer: Self-pay | Admitting: Internal Medicine

## 2015-02-20 ENCOUNTER — Ambulatory Visit (INDEPENDENT_AMBULATORY_CARE_PROVIDER_SITE_OTHER): Payer: Medicare Other | Admitting: Internal Medicine

## 2015-02-20 VITALS — BP 104/68 | HR 56 | Temp 97.6°F | Resp 16 | Ht 71.0 in | Wt 190.0 lb

## 2015-02-20 DIAGNOSIS — E785 Hyperlipidemia, unspecified: Secondary | ICD-10-CM

## 2015-02-20 DIAGNOSIS — E1121 Type 2 diabetes mellitus with diabetic nephropathy: Secondary | ICD-10-CM | POA: Diagnosis not present

## 2015-02-20 DIAGNOSIS — I2510 Atherosclerotic heart disease of native coronary artery without angina pectoris: Secondary | ICD-10-CM

## 2015-02-20 DIAGNOSIS — E559 Vitamin D deficiency, unspecified: Secondary | ICD-10-CM | POA: Diagnosis not present

## 2015-02-20 DIAGNOSIS — Z79899 Other long term (current) drug therapy: Secondary | ICD-10-CM | POA: Diagnosis not present

## 2015-02-20 DIAGNOSIS — Z9861 Coronary angioplasty status: Secondary | ICD-10-CM

## 2015-02-20 DIAGNOSIS — Z6826 Body mass index (BMI) 26.0-26.9, adult: Secondary | ICD-10-CM

## 2015-02-20 DIAGNOSIS — I10 Essential (primary) hypertension: Secondary | ICD-10-CM | POA: Diagnosis not present

## 2015-02-20 DIAGNOSIS — E1129 Type 2 diabetes mellitus with other diabetic kidney complication: Secondary | ICD-10-CM | POA: Diagnosis not present

## 2015-02-20 LAB — TSH: TSH: 1.834 u[IU]/mL (ref 0.350–4.500)

## 2015-02-20 LAB — CBC WITH DIFFERENTIAL/PLATELET
Basophils Absolute: 0.1 10*3/uL (ref 0.0–0.1)
Basophils Relative: 1 % (ref 0–1)
EOS ABS: 0.4 10*3/uL (ref 0.0–0.7)
Eosinophils Relative: 5 % (ref 0–5)
HCT: 44.7 % (ref 39.0–52.0)
HEMOGLOBIN: 15.2 g/dL (ref 13.0–17.0)
LYMPHS ABS: 1.8 10*3/uL (ref 0.7–4.0)
Lymphocytes Relative: 23 % (ref 12–46)
MCH: 31.4 pg (ref 26.0–34.0)
MCHC: 34 g/dL (ref 30.0–36.0)
MCV: 92.4 fL (ref 78.0–100.0)
MONO ABS: 1 10*3/uL (ref 0.1–1.0)
MPV: 9.7 fL (ref 8.6–12.4)
Monocytes Relative: 12 % (ref 3–12)
Neutro Abs: 4.7 10*3/uL (ref 1.7–7.7)
Neutrophils Relative %: 59 % (ref 43–77)
Platelets: 210 10*3/uL (ref 150–400)
RBC: 4.84 MIL/uL (ref 4.22–5.81)
RDW: 13.5 % (ref 11.5–15.5)
WBC: 8 10*3/uL (ref 4.0–10.5)

## 2015-02-20 LAB — HEPATIC FUNCTION PANEL
ALT: 16 U/L (ref 9–46)
AST: 14 U/L (ref 10–35)
Albumin: 3.9 g/dL (ref 3.6–5.1)
Alkaline Phosphatase: 55 U/L (ref 40–115)
BILIRUBIN DIRECT: 0.1 mg/dL (ref <=0.2)
Indirect Bilirubin: 0.4 mg/dL (ref 0.2–1.2)
TOTAL PROTEIN: 6.2 g/dL (ref 6.1–8.1)
Total Bilirubin: 0.5 mg/dL (ref 0.2–1.2)

## 2015-02-20 LAB — BASIC METABOLIC PANEL WITH GFR
BUN: 16 mg/dL (ref 7–25)
CALCIUM: 9.3 mg/dL (ref 8.6–10.3)
CO2: 26 mmol/L (ref 20–31)
CREATININE: 0.87 mg/dL (ref 0.70–1.18)
Chloride: 104 mmol/L (ref 98–110)
GFR, Est African American: >89 mL/min (ref >=60)
GFR, Est Non African American: 83 mL/min (ref >=60)
GLUCOSE: 81 mg/dL (ref 65–99)
POTASSIUM: 4.3 mmol/L (ref 3.5–5.3)
Sodium: 140 mmol/L (ref 135–146)

## 2015-02-20 LAB — LIPID PANEL
CHOL/HDL RATIO: 3.5 ratio (ref <=5.0)
Cholesterol: 163 mg/dL (ref 125–200)
HDL: 47 mg/dL (ref >=40)
LDL Cholesterol: 92 mg/dL (ref <130)
Triglycerides: 121 mg/dL (ref <150)
VLDL: 24 mg/dL (ref <30)

## 2015-02-20 LAB — HEMOGLOBIN A1C
HEMOGLOBIN A1C: 6.2 % — AB (ref <5.7)
MEAN PLASMA GLUCOSE: 131 mg/dL — AB (ref <117)

## 2015-02-20 LAB — MAGNESIUM: Magnesium: 1.9 mg/dL (ref 1.5–2.5)

## 2015-02-20 NOTE — Patient Instructions (Signed)

## 2015-02-20 NOTE — Progress Notes (Signed)
Patient ID: David [name], male   DOB: [birth year], 79 y.o.   MRN: [id number]   This very nice 79 y.o. MWM presents for  follow up with Hypertension, Hyperlipidemia, Pre-Diabetes and Vitamin D Deficiency. Patient relates having recent Lt CE?IOL implant and scheduled for the rt soon.    Patient is treated for HTN & BP has been controlled at home. Today's BP: 104/68 mmHg. In June 2011 , he had a NSTEMI and had PTCA/DES and has done well since.  Patient has had no complaints of any cardiac type chest pain, palpitations, dyspnea/orthopnea/PND, dizziness, claudication, or dependent edema. He doe s exercise fairly regularly by staying active with physically exertional projects.    Hyperlipidemia is not controlled with diet & meds. Patient denies myalgias or other med SE's. Last Lipids were not at goal - Cholesterol 188; HDL 52; LDL 110*; Triglycerides 131 on 11/16/2014.   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  He very infrequently checks CBG's. Last A1c was  6.1% on 11/16/2014.   Further, the patient also has history of Vitamin D Deficiency and supplements vitamin D without any suspected side-effects. Last vitamin D was 54 on 08/02/2014.  Medication Sig  . aspirin EC 81 MG tablet Take 1 tablet (81 mg total) by mouth every other day.  . Vitamin D 2,000 UNITS tablet Take 2,000 Units by mouth daily.   Marland Kitchen CINNAMON  Take 1,000 mg by mouth 2 (two) times daily.  . clopidogrel  75 MG tablet Take 1 tablet (75 mg total) by mouth daily.  Marland Kitchen doxazosin  4 MG tablet TAKE 1 TABLET BY MOUTH AT BEDTIME  . DUREZOL 0.05 % EMUL 1 drop by Other route daily. Instill 1 drop in left eye daily  . finasteride  5 MG tablet Take 5 mg by mouth daily.  Marland Kitchen ketorolac (ACULAR) 0.5 % ophthalmic solution Place 4 drops into the left eye 4 (four) times daily. Place 1 drop in left eye 4 times a day  . lisinopril  5 MG  tablet TAKE 1 TABLET BY MOUTH DAILY.  . Magnesium 500 MG CAPS Take 1 capsule by mouth daily.  . metFORMIN -XR 500 MG 24 hr tablet TAKE 2 TABLETS BY MOUTH TWICE DAILY  . ofloxacin (OCUFLOX) 0.3 % ophthalmic solution Place 1 drop into both eyes 4 (four) times daily. Place 1 drop in both eyes 4 times a day  . ZETIA 10 MG tablet TAKE 1 TABLET BY MOUTH EVERY DAY   Allergies  Allergen Reactions  . Statins Other (See Comments)    Myalgias and cramping Both Crestor and Lipitor  . Tetanus Toxoids    PMHx:   Past Medical History  Diagnosis Date  . Non-ST elevation MI (NSTEMI) June 2011    PCI to LAD  . CAD S/P percutaneous coronary angioplasty 12/2009; 08/2010    a) 6/'22 - NSTEMI: PCI to LAD: Promus Element 2.5 mm x 15 mm DES; b) Cath for Angina: Prox LAD 60-70% pre-stent with FFR 0.82, 90% RVM -- Med Rx, EF 50-55%  . Essential hypertension   . Hyperlipidemia with target LDL less than 70     Statin intolerance  . Type 2 diabetes mellitus without complications   . BPH (benign prostatic hyperplasia)    Immunization History  Administered Date(s) Administered  . Influenza, High Dose Seasonal PF 06/01/2013, 04/18/2014  . Influenza-Unspecified 05/13/2011  . Pneumococcal Conjugate-13  04/18/2014  . Pneumococcal Polysaccharide-23 07/21/2002  . Td 07/21/2000   Past Surgical History  Procedure Laterality Date  . Cardiac catheterization  12/2009    Proximal LAD stenosis followed by a significant 80-90% distal stenosis  . Coronary angioplasty  12/2009    PTCA to proximal LAD; PCI with Promus Element DES stent  2.5 mm x 15 mm  distalLAD - .for non-ST elevation MI  . Cardiac catheterization  February 2012     90% ostial RV marginal branch; 60-70% proximal LAD with widely patent distal stent. FFR 0.82; medical therapy  . Vasectomy    . Transthoracic echocardiogram  June 2011    - (EF not reported) Moderately dilated LV; moderate hypokinesis of anteroseptal and anterior wall consistent with MI. Grade 1  diastolic dysfunction. Mild to moderately dilated LA   FHx:    Reviewed / unchanged  SHx:    Reviewed / unchanged  Systems Review:  Constitutional: Denies fever, chills, wt changes, headaches, insomnia, fatigue, night sweats, change in appetite. Eyes: Denies redness, blurred vision, diplopia, discharge, itchy, watery eyes.  ENT: Denies discharge, congestion, post nasal drip, epistaxis, sore throat, earache, hearing loss, dental pain, tinnitus, vertigo, sinus pain, snoring.  CV: Denies chest pain, palpitations, irregular heartbeat, syncope, dyspnea, diaphoresis, orthopnea, PND, claudication or edema. Respiratory: denies cough, dyspnea, DOE, pleurisy, hoarseness, laryngitis, wheezing.  Gastrointestinal: Denies dysphagia, odynophagia, heartburn, reflux, water brash, abdominal pain or cramps, nausea, vomiting, bloating, diarrhea, constipation, hematemesis, melena, hematochezia  or hemorrhoids. Genitourinary: Denies dysuria, frequency, urgency, nocturia, hesitancy, discharge, hematuria or flank pain. Musculoskeletal: Denies arthralgias, myalgias, stiffness, jt. swelling, pain, limping or strain/sprain.  Skin: Denies pruritus, rash, hives, warts, acne, eczema or change in skin lesion(s). Neuro: No weakness, tremor, incoordination, spasms, paresthesia or pain. Psychiatric: Denies confusion, memory loss or sensory loss. Endo: Denies change in weight, skin or hair change.  Heme/Lymph: No excessive bleeding, bruising or enlarged lymph nodes.  Physical Exam  BP 104/68   Pulse 56  Temp 97.6 F   Resp 16  Ht 5\' 11"    Wt 190 lb     BMI 26.51   Appears well nourished and in no distress. Eyes: PERRLA, EOMs, conjunctiva no swelling or erythema. Sinuses: No frontal/maxillary tenderness ENT/Mouth: EAC's clear, TM's nl w/o erythema, bulging. Nares clear w/o erythema, swelling, exudates. Oropharynx clear without erythema or exudates. Oral hygiene is good. Tongue normal, non obstructing. Hearing intact.   Neck: Supple. Thyroid nl. Car 2+/2+ without bruits, nodes or JVD. Chest: Respirations nl with BS clear & equal w/o rales, rhonchi, wheezing or stridor.  Cor: Heart sounds normal w/ regular rate and rhythm without sig. murmurs, gallops, clicks, or rubs. Peripheral pulses normal and equal  without edema.  Abdomen: Soft & bowel sounds normal. Non-tender w/o guarding, rebound, hernias, masses, or organomegaly.  Lymphatics: Unremarkable.  Musculoskeletal: Full ROM all peripheral extremities, joint stability, 5/5 strength, and normal gait.  Skin: Warm, dry without exposed rashes, lesions or ecchymosis apparent.  Neuro: Cranial nerves intact, reflexes equal bilaterally. Sensory-motor testing grossly intact. Tendon reflexes grossly intact.  Pysch: Alert & oriented x 3.  Insight and judgement nl & appropriate. No ideations.  Assessment and Plan:  1. Essential hypertension  - TSH  2. Hyperlipidemia with statin intolerance  - Lipid panel  3. Type II diabetes mellitus with nephropathy  - Hemoglobin A1c - Insulin, random  4. Vitamin D deficiency  - Vit D  25 hydroxy   5. CAD S/P percutaneous coronary angioplasty and PCI- DES mid LAD  in the setting of non-STEMI   6. Medication management  - CBC with Differential/Platelet - BASIC METABOLIC PANEL WITH GFR - Hepatic function panel - Magnesium   Recommended regular exercise, BP monitoring, weight control, and discussed med and SE's. Recommended labs to assess and monitor clinical status. Further disposition pending results of labs. Over 30 minutes of exam, counseling, chart review was performed

## 2015-02-21 DIAGNOSIS — R972 Elevated prostate specific antigen [PSA]: Secondary | ICD-10-CM | POA: Diagnosis not present

## 2015-02-21 LAB — INSULIN, RANDOM: INSULIN: 9.1 u[IU]/mL (ref 2.0–19.6)

## 2015-02-21 LAB — VITAMIN D 25 HYDROXY (VIT D DEFICIENCY, FRACTURES): Vit D, 25-Hydroxy: 57 ng/mL (ref 30–100)

## 2015-02-25 ENCOUNTER — Other Ambulatory Visit: Payer: Self-pay | Admitting: Cardiology

## 2015-02-26 NOTE — Telephone Encounter (Signed)
REFILL 

## 2015-02-27 DIAGNOSIS — H2511 Age-related nuclear cataract, right eye: Secondary | ICD-10-CM | POA: Diagnosis not present

## 2015-04-02 ENCOUNTER — Other Ambulatory Visit: Payer: Self-pay | Admitting: Cardiology

## 2015-04-02 NOTE — Telephone Encounter (Signed)
Rx request sent to pharmacy.  

## 2015-04-03 DIAGNOSIS — S0011XA Contusion of right eyelid and periocular area, initial encounter: Secondary | ICD-10-CM | POA: Diagnosis not present

## 2015-04-15 ENCOUNTER — Other Ambulatory Visit: Payer: Self-pay | Admitting: Internal Medicine

## 2015-05-06 ENCOUNTER — Other Ambulatory Visit: Payer: Self-pay | Admitting: Internal Medicine

## 2015-05-23 ENCOUNTER — Encounter: Payer: Self-pay | Admitting: Internal Medicine

## 2015-05-23 ENCOUNTER — Ambulatory Visit (INDEPENDENT_AMBULATORY_CARE_PROVIDER_SITE_OTHER): Payer: Medicare Other | Admitting: Internal Medicine

## 2015-05-23 VITALS — BP 102/54 | HR 68 | Temp 98.0°F | Resp 16 | Ht 71.0 in | Wt 192.0 lb

## 2015-05-23 DIAGNOSIS — E785 Hyperlipidemia, unspecified: Secondary | ICD-10-CM

## 2015-05-23 DIAGNOSIS — E1129 Type 2 diabetes mellitus with other diabetic kidney complication: Secondary | ICD-10-CM | POA: Diagnosis not present

## 2015-05-23 DIAGNOSIS — E559 Vitamin D deficiency, unspecified: Secondary | ICD-10-CM

## 2015-05-23 DIAGNOSIS — E1121 Type 2 diabetes mellitus with diabetic nephropathy: Secondary | ICD-10-CM | POA: Diagnosis not present

## 2015-05-23 DIAGNOSIS — I10 Essential (primary) hypertension: Secondary | ICD-10-CM | POA: Diagnosis not present

## 2015-05-23 DIAGNOSIS — F17200 Nicotine dependence, unspecified, uncomplicated: Secondary | ICD-10-CM

## 2015-05-23 DIAGNOSIS — Z79899 Other long term (current) drug therapy: Secondary | ICD-10-CM

## 2015-05-23 DIAGNOSIS — Z23 Encounter for immunization: Secondary | ICD-10-CM

## 2015-05-23 LAB — BASIC METABOLIC PANEL WITH GFR
BUN: 15 mg/dL (ref 7–25)
CHLORIDE: 103 mmol/L (ref 98–110)
CO2: 28 mmol/L (ref 20–31)
Calcium: 9.5 mg/dL (ref 8.6–10.3)
Creat: 0.87 mg/dL (ref 0.70–1.18)
GFR, Est African American: >89 mL/min (ref >=60)
GFR, Est Non African American: 83 mL/min (ref >=60)
Glucose, Bld: 85 mg/dL (ref 65–99)
Potassium: 4.2 mmol/L (ref 3.5–5.3)
Sodium: 137 mmol/L (ref 135–146)

## 2015-05-23 LAB — CBC WITH DIFFERENTIAL/PLATELET
BASOS ABS: 0.1 10*3/uL (ref 0.0–0.1)
BASOS PCT: 1 % (ref 0–1)
EOS PCT: 4 % (ref 0–5)
Eosinophils Absolute: 0.3 10*3/uL (ref 0.0–0.7)
HCT: 45.4 % (ref 39.0–52.0)
Hemoglobin: 15.3 g/dL (ref 13.0–17.0)
Lymphocytes Relative: 22 % (ref 12–46)
Lymphs Abs: 1.7 10*3/uL (ref 0.7–4.0)
MCH: 31.7 pg (ref 26.0–34.0)
MCHC: 33.7 g/dL (ref 30.0–36.0)
MCV: 94.2 fL (ref 78.0–100.0)
MONO ABS: 0.7 10*3/uL (ref 0.1–1.0)
MPV: 10.4 fL (ref 8.6–12.4)
Monocytes Relative: 9 % (ref 3–12)
Neutro Abs: 4.8 10*3/uL (ref 1.7–7.7)
Neutrophils Relative %: 64 % (ref 43–77)
Platelets: 229 10*3/uL (ref 150–400)
RBC: 4.82 MIL/uL (ref 4.22–5.81)
RDW: 13.7 % (ref 11.5–15.5)
WBC: 7.5 10*3/uL (ref 4.0–10.5)

## 2015-05-23 LAB — HEPATIC FUNCTION PANEL
ALK PHOS: 62 U/L (ref 40–115)
ALT: 20 U/L (ref 9–46)
AST: 19 U/L (ref 10–35)
Albumin: 4.2 g/dL (ref 3.6–5.1)
BILIRUBIN INDIRECT: 0.4 mg/dL (ref 0.2–1.2)
Bilirubin, Direct: 0.1 mg/dL (ref <=0.2)
TOTAL PROTEIN: 6.5 g/dL (ref 6.1–8.1)
Total Bilirubin: 0.5 mg/dL (ref 0.2–1.2)

## 2015-05-23 LAB — HEMOGLOBIN A1C
HEMOGLOBIN A1C: 6 % — AB (ref <5.7)
Mean Plasma Glucose: 126 mg/dL — ABNORMAL HIGH (ref <117)

## 2015-05-23 LAB — LIPID PANEL
Cholesterol: 181 mg/dL (ref 125–200)
HDL: 49 mg/dL (ref >=40)
LDL CALC: 108 mg/dL (ref <130)
Total CHOL/HDL Ratio: 3.7 Ratio (ref <=5.0)
Triglycerides: 119 mg/dL (ref <150)
VLDL: 24 mg/dL (ref <30)

## 2015-05-23 NOTE — Progress Notes (Signed)
Patient ID: David [name], male   DOB: Jul 26, 1935, 79 y.o.   MRN: [id number]  Assessment and Plan:  Hypertension:  -will contact Dr. Ellyn Hack about stopping lisinopril given low BP -Continue medication -monitor blood pressure at home. -Continue DASH diet -Reminder to go to the ER if any CP, SOB, nausea, dizziness, severe HA, changes vision/speech, left arm numbness and tingling and jaw pain.  Cholesterol - Continue diet and exercise -Check cholesterol.   Diabetes without complications -Continue diet and exercise.  -Check A1C  Vitamin D Def -check level -continue medications.   Continue diet and meds as discussed. Further disposition pending results of labs. Discussed med's effects and SE's.    HPI 79 y.o. male  presents for 3 month follow up with hypertension, hyperlipidemia, diabetes and vitamin D deficiency.   His blood pressure has been controlled at home, today their BP is BP: (!) 102/54 mmHg.He does workout. He denies chest pain, shortness of breath, dizziness.   He is on cholesterol medication and denies myalgias. His cholesterol is at goal. The cholesterol was:  02/20/2015: Cholesterol 163; HDL 47; LDL Cholesterol 92; Triglycerides 121   He has been working on diet and exercise for diabetes without complications, he is on bASA, he is on ACE/ARB, and denies  foot ulcerations, hyperglycemia, hypoglycemia , increased appetite, nausea, paresthesia of the feet, polydipsia, polyuria, visual disturbances, vomiting and weight loss. Last A1C was: 02/20/2015: Hgb A1c MFr Bld 6.2*   Patient is on Vitamin D supplement. 02/20/2015: Vit D, 25-Hydroxy 57  Patient reports that has been bruising a lot.  He reports that he didn't bruise so easily when he was doing aspirin every other day.      Current Medications:  Current Outpatient Prescriptions on File Prior to Visit  Medication Sig Dispense Refill  . aspirin EC 81 MG tablet Take 1 tablet (81 mg total) by mouth every other day. 30 tablet  11  . cholecalciferol (VITAMIN D) 1000 UNITS tablet Take 2,000 Units by mouth daily.     Marland Kitchen CINNAMON PO Take 1,000 mg by mouth 2 (two) times daily.    . clopidogrel (PLAVIX) 75 MG tablet TAKE 1 TABLET BY MOUTH EVERY DAY 90 tablet 3  . doxazosin (CARDURA) 4 MG tablet TAKE 1 TABLET BY MOUTH AT BEDTIME 90 tablet 3  . DUREZOL 0.05 % EMUL 1 drop by Other route daily. Instill 1 drop in left eye daily  1  . finasteride (PROSCAR) 5 MG tablet Take 5 mg by mouth daily.    Marland Kitchen lisinopril (PRINIVIL,ZESTRIL) 5 MG tablet TAKE 1 TABLET BY MOUTH DAILY. 90 tablet 3  . Magnesium 500 MG CAPS Take 1 capsule by mouth daily.    . metFORMIN (GLUCOPHAGE-XR) 500 MG 24 hr tablet TAKE 2 TABLETS BY MOUTH TWICE DAILY 360 tablet 1  . ZETIA 10 MG tablet TAKE 1 TABLET BY MOUTH EVERY DAY 90 tablet 1   No current facility-administered medications on file prior to visit.   Medical History:  Past Medical History  Diagnosis Date  . Non-ST elevation MI (NSTEMI) June 2011    PCI to LAD  . CAD S/P percutaneous coronary angioplasty 12/2009; 08/2010    a) 6/'22 - NSTEMI: PCI to LAD: Promus Element 2.5 mm x 15 mm DES; b) Cath for Angina: Prox LAD 60-70% pre-stent with FFR 0.82, 90% RVM -- Med Rx, EF 50-55%  . Essential hypertension   . Hyperlipidemia with target LDL less than 70     Statin intolerance  .  Type 2 diabetes mellitus without complications   . BPH (benign prostatic hyperplasia)    Allergies:  Allergies  Allergen Reactions  . Statins Other (See Comments)    Myalgias and cramping Both Crestor and Lipitor  . Tetanus Toxoids      Review of Systems:  Review of Systems  Constitutional: Negative for fever, chills and malaise/fatigue.  HENT: Negative for congestion, ear pain and sore throat.   Eyes: Negative.   Respiratory: Negative for cough, shortness of breath and wheezing.   Cardiovascular: Negative for chest pain, palpitations and leg swelling.  Gastrointestinal: Negative for heartburn, diarrhea, constipation,  blood in stool and melena.  Genitourinary: Negative.   Neurological: Negative for dizziness, loss of consciousness and headaches.  Psychiatric/Behavioral: Negative for depression. The patient is not nervous/anxious and does not have insomnia.     Family history- Review and unchanged  Social history- Review and unchanged  Physical Exam: BP 102/54 mmHg  Pulse 68  Temp(Src) 98 F (36.7 C) (Temporal)  Resp 16  Ht 5\' 11"  (1.803 m)  Wt 192 lb (87.091 kg)  BMI 26.79 kg/m2 Wt Readings from Last 3 Encounters:  05/23/15 192 lb (87.091 kg)  02/20/15 190 lb (86.183 kg)  02/16/15 189 lb (85.73 kg)   General Appearance: Well nourished well developed, non-toxic appearing, in no apparent distress. Eyes: PERRLA, EOMs, conjunctiva no swelling or erythema ENT/Mouth: Ear canals clear with no erythema, swelling, or discharge.  TMs normal bilaterally, oropharynx clear, moist, with no exudate.   Neck: Supple, thyroid normal, no JVD, no cervical adenopathy.  Respiratory: Respiratory effort normal, breath sounds clear A&P, no wheeze, rhonchi or rales noted.  No retractions, no accessory muscle usage Cardio: RRR with no MRGs. No noted edema.  Abdomen: Soft, + BS.  Non tender, no guarding, rebound, hernias, masses. Musculoskeletal: Full ROM, 5/5 strength, Normal gait Skin: Warm, dry without rashes, lesions, ecchymosis.  Neuro: Awake and oriented X 3, Cranial nerves intact. No cerebellar symptoms.  Psych: normal affect, Insight and Judgment appropriate.    Starlyn Skeans, PA-C 9:55 AM [facility]

## 2015-05-23 NOTE — Addendum Note (Signed)
Addended by: Evoleht Hovatter A on: 05/23/2015 10:27 AM   Modules accepted: Orders

## 2015-05-24 LAB — TSH: TSH: 1.803 u[IU]/mL (ref 0.350–4.500)

## 2015-05-25 DIAGNOSIS — L570 Actinic keratosis: Secondary | ICD-10-CM | POA: Diagnosis not present

## 2015-05-25 DIAGNOSIS — D485 Neoplasm of uncertain behavior of skin: Secondary | ICD-10-CM | POA: Diagnosis not present

## 2015-05-25 DIAGNOSIS — L820 Inflamed seborrheic keratosis: Secondary | ICD-10-CM | POA: Diagnosis not present

## 2015-06-06 DIAGNOSIS — M7130 Other bursal cyst, unspecified site: Secondary | ICD-10-CM | POA: Diagnosis not present

## 2015-06-06 DIAGNOSIS — M4806 Spinal stenosis, lumbar region: Secondary | ICD-10-CM | POA: Diagnosis not present

## 2015-06-06 DIAGNOSIS — M5416 Radiculopathy, lumbar region: Secondary | ICD-10-CM | POA: Diagnosis not present

## 2015-06-06 DIAGNOSIS — M4316 Spondylolisthesis, lumbar region: Secondary | ICD-10-CM | POA: Diagnosis not present

## 2015-06-06 DIAGNOSIS — Z6827 Body mass index (BMI) 27.0-27.9, adult: Secondary | ICD-10-CM | POA: Diagnosis not present

## 2015-06-06 DIAGNOSIS — M4310 Spondylolisthesis, site unspecified: Secondary | ICD-10-CM | POA: Insufficient documentation

## 2015-06-09 DIAGNOSIS — M4316 Spondylolisthesis, lumbar region: Secondary | ICD-10-CM | POA: Diagnosis not present

## 2015-06-09 DIAGNOSIS — M4806 Spinal stenosis, lumbar region: Secondary | ICD-10-CM | POA: Diagnosis not present

## 2015-06-12 DIAGNOSIS — Z6827 Body mass index (BMI) 27.0-27.9, adult: Secondary | ICD-10-CM | POA: Diagnosis not present

## 2015-06-12 DIAGNOSIS — M545 Low back pain, unspecified: Secondary | ICD-10-CM | POA: Insufficient documentation

## 2015-06-12 DIAGNOSIS — M4806 Spinal stenosis, lumbar region: Secondary | ICD-10-CM | POA: Diagnosis not present

## 2015-06-12 DIAGNOSIS — M7130 Other bursal cyst, unspecified site: Secondary | ICD-10-CM | POA: Diagnosis not present

## 2015-06-12 DIAGNOSIS — M5416 Radiculopathy, lumbar region: Secondary | ICD-10-CM | POA: Diagnosis not present

## 2015-06-28 ENCOUNTER — Telehealth: Payer: Self-pay | Admitting: *Deleted

## 2015-06-28 NOTE — Telephone Encounter (Signed)
Spoke to patient radiation Per Dr Ellyn Hack, wanted know if any chest pain, CHF, symptoms Patient states no symptoms - (chest discomfort,no radiation of chest discomfort, shortness of breath,edema) No symptoms, per Dr Ellyn Hack -- OKAY for surgery May hold plavix for 7 days  Per op - L4 to S1 DECOMPRESSIVE LUMBAR LAMINECTOMY  FAXED WRITTEN FORM , LAST OFFICE VISIT TO  [phone]

## 2015-06-29 ENCOUNTER — Ambulatory Visit (INDEPENDENT_AMBULATORY_CARE_PROVIDER_SITE_OTHER): Payer: Medicare Other | Admitting: Internal Medicine

## 2015-06-29 ENCOUNTER — Other Ambulatory Visit: Payer: Self-pay | Admitting: Internal Medicine

## 2015-06-29 ENCOUNTER — Encounter: Payer: Self-pay | Admitting: Internal Medicine

## 2015-06-29 ENCOUNTER — Ambulatory Visit (HOSPITAL_COMMUNITY)
Admission: RE | Admit: 2015-06-29 | Discharge: 2015-06-29 | Disposition: A | Discharge status: Home / Self Care | Payer: Medicare Other | Source: Ambulatory Visit | Attending: Internal Medicine | Admitting: Internal Medicine

## 2015-06-29 VITALS — BP 128/60 | HR 78 | Temp 98.2°F | Resp 18 | Ht 71.0 in | Wt 191.0 lb

## 2015-06-29 DIAGNOSIS — R079 Chest pain, unspecified: Secondary | ICD-10-CM | POA: Insufficient documentation

## 2015-06-29 DIAGNOSIS — R0781 Pleurodynia: Secondary | ICD-10-CM

## 2015-06-29 DIAGNOSIS — I2510 Atherosclerotic heart disease of native coronary artery without angina pectoris: Secondary | ICD-10-CM | POA: Diagnosis not present

## 2015-06-29 DIAGNOSIS — R918 Other nonspecific abnormal finding of lung field: Secondary | ICD-10-CM | POA: Diagnosis not present

## 2015-06-29 DIAGNOSIS — J90 Pleural effusion, not elsewhere classified: Secondary | ICD-10-CM | POA: Diagnosis not present

## 2015-06-29 MED ORDER — BACLOFEN 10 MG PO TABS: 10.0000 mg | ORAL_TABLET | Freq: Every day | ORAL | Status: DC

## 2015-06-29 MED ORDER — LEVOFLOXACIN 750 MG PO TABS: 750.0000 mg | ORAL_TABLET | Freq: Every day | ORAL | Status: DC

## 2015-06-29 NOTE — Patient Instructions (Signed)

## 2015-06-29 NOTE — Progress Notes (Signed)
   Subjective:    Patient ID: David [name], male    DOB: April 10, 1936, 78 y.o.   MRN: [id number]  HPI  Patient presents to the office for evaluation of left sided abdominal pain.  He reports that it has been going on for 1.5. Weeks.  He reports that the pain is intermittent and is still there at rest.  The pain is worse with movement.  He has not tried any medications.  He has tried nothing.    Review of Systems  Constitutional: Negative for fever, chills and fatigue.  Respiratory: Negative for chest tightness and shortness of breath.   Cardiovascular: Negative for chest pain and leg swelling.  Gastrointestinal: Negative for nausea, vomiting, diarrhea, constipation, blood in stool and anal bleeding.  Genitourinary: Positive for flank pain. Negative for urgency and hematuria.  Musculoskeletal: Positive for myalgias.  Neurological: Negative for dizziness, weakness and numbness.       Objective:   Physical Exam  Constitutional: He appears well-developed and well-nourished. No distress.  HENT:  Head: Normocephalic.  Mouth/Throat: Oropharynx is clear and moist. No oropharyngeal exudate.  Eyes: Conjunctivae are normal. No scleral icterus.  Neck: Normal range of motion. Neck supple. No JVD present. No thyromegaly present.  Cardiovascular: Normal rate, regular rhythm, normal heart sounds and intact distal pulses.  Exam reveals no gallop and no friction rub.   No murmur heard. Pulmonary/Chest: Effort normal and breath sounds normal. No respiratory distress. He has no wheezes. He has no rales. He exhibits tenderness.  Tenderness to the left anterior CVA with no visible abnormalities.  Diminshed breath sounds at bases but no adventitious sounds.  Lymphadenopathy:    He has no cervical adenopathy.  Skin: He is not diaphoretic.  Nursing note and vitals reviewed.   Filed Vitals:   06/29/15 1040  BP: 128/60  Pulse: 78  Temp: 98.2 F (36.8 C)  Resp: 18         Assessment & Plan:     1. Rib pain on left side  - baclofen (LIORESAL) 10 MG tablet; Take 1 tablet (10 mg total) by mouth daily.  Dispense: 30 tablet; Refill: 1 - DG Chest 2 View; Future  Chest xray shows apparent LLL PNA.  Sent in [place].  Recheck 1 week for improvement.  Needs repeat CXR 4-6 weeks.

## 2015-07-05 ENCOUNTER — Ambulatory Visit (INDEPENDENT_AMBULATORY_CARE_PROVIDER_SITE_OTHER): Payer: Medicare Other | Admitting: Internal Medicine

## 2015-07-05 VITALS — BP 110/60 | HR 68 | Temp 97.8°F | Resp 18 | Ht 71.0 in | Wt 188.0 lb

## 2015-07-05 DIAGNOSIS — J189 Pneumonia, unspecified organism: Secondary | ICD-10-CM | POA: Diagnosis not present

## 2015-07-05 DIAGNOSIS — I2510 Atherosclerotic heart disease of native coronary artery without angina pectoris: Secondary | ICD-10-CM | POA: Diagnosis not present

## 2015-07-05 NOTE — Progress Notes (Signed)
   Subjective:    Patient ID: David [name], male    DOB: [birth year], 79 y.o.   MRN: [id number]  HPI  Patient here for 1 week recheck of LUQ pain and PNA after full weeks worth levaquin.  He reports that he is feeling much better.  He reports that he is coughing still and is getting clear phlegm up.  He still has some minimal chest pain with coughing.  The aching pain with movement is now gone.    Review of Systems  Constitutional: Negative for fever, chills and fatigue.  HENT: Negative for congestion, ear pain, postnasal drip, rhinorrhea, sore throat and voice change.   Respiratory: Positive for cough. Negative for chest tightness, shortness of breath and wheezing.   Cardiovascular: Positive for chest pain. Negative for palpitations and leg swelling.  Gastrointestinal: Negative for nausea, vomiting, abdominal pain, diarrhea and constipation.       Objective:   Physical Exam  Constitutional: He is oriented to person, place, and time. He appears well-developed and well-nourished. No distress.  HENT:  Head: Normocephalic.  Mouth/Throat: Oropharynx is clear and moist. No oropharyngeal exudate.  Eyes: Conjunctivae are normal. No scleral icterus.  Neck: Normal range of motion. Neck supple. No JVD present. No thyromegaly present.  Cardiovascular: Normal rate, regular rhythm and intact distal pulses.  Exam reveals no gallop and no friction rub.   No murmur heard. Pulmonary/Chest: Effort normal and breath sounds normal. No respiratory distress. He has no wheezes. He has no rales. He exhibits no tenderness.  Abdominal: Soft. Bowel sounds are normal. He exhibits no distension and no mass. There is no tenderness. There is no rebound and no guarding.  Musculoskeletal: Normal range of motion.  Lymphadenopathy:    He has no cervical adenopathy.  Neurological: He is alert and oriented to person, place, and time.  Skin: Skin is warm and dry. He is not diaphoretic.  Psychiatric: He has a normal  mood and affect. His behavior is normal. Judgment and thought content normal.  Nursing note and vitals reviewed.   Filed Vitals:   07/05/15 1359  BP: 110/60  Pulse: 68  Temp: 97.8 F (36.6 C)  Resp: 18          Assessment & Plan:    1. CAP (community acquired pneumonia) -clinically improved -monitor breathing at home -full levaquin course completed -recheck xray for clinical clearing - DG Chest 2 View; Future

## 2015-07-06 ENCOUNTER — Other Ambulatory Visit: Payer: Self-pay | Admitting: Neurosurgery

## 2015-07-17 DIAGNOSIS — H40013 Open angle with borderline findings, low risk, bilateral: Secondary | ICD-10-CM | POA: Diagnosis not present

## 2015-07-22 NOTE — H&P (Signed)
Patient ID:   [id number] Patient: David [name]  Date of Birth: [birth year] Visit Type: Office Visit   Date: 06/12/2015 03:15 PM Provider: Marchia Meiers. Vertell Limber MD   This 80 year old male presents for leg pain.  History of Present Illness: 1.  leg pain  Patient returns to review MRI  MRI demonstrates interval worsening of L4 L5 spinal stenosis with persistent spinal stenosis and synovial cyst at the L5-S1 level.  I have recommended proceeding with decompressive lumbar laminectomy for spinal stenosis L4 through S1 levels.  The patient says he is tired of hurting wants to get some relief and wishes to proceed.  The patient will require cardiology clearance by Dr. Ellyn Hack with the plan for proceeding with surgery in early January.      Medical/Surgical/Interim History Reviewed, no change.   PAST MEDICAL HISTORY, SURGICAL HISTORY, FAMILY HISTORY, SOCIAL HISTORY AND REVIEW OF SYSTEMS I have reviewed the patient's past medical, surgical, family and social history as well as the comprehensive review of systems as included on the [facility] history form dated 06/06/2015, which I have signed.  Family History: Reviewed, no changes.    Social History: Tobacco use reviewed. Reviewed, no changes.     MEDICATIONS(added, continued or stopped this visit): Started Medication Directions Instruction Stopped   aspirin 81 mg tablet,delayed release take 1 tablet by oral route  every day     Cardura 4 mg tablet take 1 tablet by oral route  every day     Cinnamon 500 mg capsule Take as directed     magnesium 500 mg ORAL Take as directed     metformin 500 mg tablet take 1 tablet by oral route 2 times every day with morning and evening meals     Plavix 75 mg tablet take 1 tablet by oral route  every day     Proscar 5 mg tablet take 1 tablet by oral route  every day     Vitamin D3 1,000 unit capsule Take as directed     Zestril 5 mg tablet take 1 tablet by oral route   every day     Zetia 10 mg tablet take 1 tablet by oral route  every day       ALLERGIES:    Vitals Date Temp F BP Pulse Ht In Wt Lb BMI BSA Pain Score  06/12/2015  129/77 77 70.5 194 27.44  0/10      IMPRESSION Lumbar spinal stenosis with synovial cysts.  Plan decompressive lumbar laminectomy L4 through S1 levels after cardiology clearance has been obtained.  Patient is on an anti-coagulant, anti-inflammatory or supplement that may increase bleeding time. Patient advised to stop medicine prior to surgery.  Completed Orders (this encounter) Order Details Reason Side Interpretation Result Initial Treatment Date Region  Lifestyle education regarding diet Encouraged to eat a well balanced diet and follow up with primary care physician         Assessment/Plan # Detail Type Description   1. 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       2. Assessment Lumbar radiculopathy (M54.16).       3. Assessment Low back pain, unspecified back pain laterality, with sciatica presence unspecified (M54.5).       4. Assessment Body mass index (BMI) 27.0-27.9, adult MW:[id number]).   Plan Orders Today's instructions / counseling include(s) Lifestyle education regarding diet.       5. Assessment Spinal stenosis of lumbar region (M48.06).  Pain Assessment/Treatment Pain Scale: 0/10. Method: Numeric Pain Intensity Scale.  Fall Risk Plan The patient has not fallen in the last year.  Proceed with surgery.  Risks and benefits were discussed in detail with the patient and he wished to proceed.  Orders: Office Procedures/Services: Assessment Service Comments   Cardiology clearance (Dr. Ellyn Hack)    Diagnostic Procedures: Assessment Procedure  M48.06 decompressive lumbar laminectomy L4-S1  Instruction(s)/Education: Assessment Instruction  Z68.27 Lifestyle education regarding diet             Provider:  Marchia Meiers. Vertell Limber MD  06/17/2015 01:07 PM Dictation edited by: Marchia Meiers.  Vertell Limber    CC Providers: Erline Levine MD [address] [place], [place] [address]              Electronically signed by Marchia Meiers. Vertell Limber MD on 06/17/2015 01:07 PM  Patient ID:   [id number] Patient: David [name]  Date of Birth: 04-24-1936 Visit Type: Office Visit   Date: 06/06/2015 09:15 AM Provider: Marchia Meiers. Vertell Limber MD   This 80 year old male presents for Leg pain and Weakness.  History of Present Illness: 1.  Leg pain  2.  Weakness  Alvino Revill, 80 year old retired male, returns having been seen in 2011.  He reports persistent BLE weakness, but does not require a cane or other assistive device.  Visit is prompted by right lower extremity pain increasing over the last few months with right lower extremity numbness.  He denies lumbar pain.  History: NIDDM well-controlled, MI 2011 Surgical history: Heart stent 2011, both eyes cataract 2016  Plavix 75 mg daily  X-rays on [place]  Patient presents with right leg pain which is worse in the mornings.  It has been bothering him and both legs for the last 5 years.  He denies significant low back pain.  He feels that his left leg is weak in his right leg is numb.  He sometimes feel that his right foot is "not here" he has to hold on to prevent falling.  I reviewed the patient's prior imaging studies and lumbar radiographs which show 2 mm of spondylolisthesis of L4 and L5 with previous left-sided facet synovial cyst at L4 L5 and right-sided foraminal stenosis due to facet and ligamentous hypertrophy at the L5-S1 level.  Not had recent MRI of his lumbar spine.       PAST MEDICAL HISTORY, SURGICAL HISTORY, FAMILY HISTORY, SOCIAL HISTORY AND REVIEW OF SYSTEMS I have reviewed the patient's past medical, surgical, family and social history as well as the comprehensive review of systems as included on the [facility] history form dated 06/06/2015, which I have signed.   MEDICATIONS(added,  continued or stopped this visit):   ALLERGIES:   REVIEW OF SYSTEMS System Neg/Pos Details  Constitutional Negative Chills, fatigue, fever, malaise, night sweats, weight gain and weight loss.  ENMT Negative Ear drainage, hearing loss, nasal drainage, otalgia, sinus pressure and sore throat.  Eyes Negative Eye discharge, eye pain and vision changes.  Respiratory Negative Chronic cough, cough, dyspnea, known TB exposure and wheezing.  Cardio Negative Chest pain, claudication, edema and irregular heartbeat/palpitations.  GI Negative Abdominal pain, blood in stool, change in stool pattern, constipation, decreased appetite, diarrhea, heartburn, nausea and vomiting.  GU Negative Dribbling, dysuria, erectile dysfunction, hematuria, polyuria, slow stream, urinary frequency, urinary incontinence and urinary retention.  Endocrine Negative Cold intolerance, heat intolerance, polydipsia and polyphagia.  Neuro Positive Extremity weakness, Numbness in extremity.  Psych Negative Anxiety, depression and insomnia.  Integumentary Negative Brittle hair, brittle nails, change in shape/size of mole(s), hair loss, hirsutism, hives, pruritus, rash and skin lesion.  MS Positive RLE pain .  Hema/Lymph Negative Easy bleeding, easy bruising and lymphadenopathy.  Allergic/Immuno Negative Contact allergy, environmental allergies, food allergies and seasonal allergies.  Reproductive Negative Penile discharge and sexual dysfunction.     Vitals Date Temp F BP Pulse Ht In Wt Lb BMI BSA Pain Score  06/06/2015  137/61 64 70.5 193 27.3  0/10     PHYSICAL EXAM General Level of Distress: no acute distress Overall Appearance: normal  Head and Face  Right Left  Fundoscopic Exam:  normal normal    Cardiovascular Cardiac: regular rate and rhythm without murmur  Right Left  Carotid Pulses: normal normal  Respiratory Lungs: clear to auscultation  Neurological Orientation: normal Recent and Remote  Memory: normal Attention Span and Concentration:   normal Language: normal Fund of Knowledge: normal  Right Left Sensation: normal normal Upper Extremity Coordination: normal normal  Lower Extremity Coordination: normal normal  Musculoskeletal Gait and Station: normal  Right Left Upper Extremity Muscle Strength: normal normal Lower Extremity Muscle Strength: normal normal Upper Extremity Muscle Tone:  normal normal Lower Extremity Muscle Tone: normal normal  Motor Strength Upper and lower extremity motor strength was tested in the clinically pertinent muscles.     Deep Tendon Reflexes  Right Left Biceps: normal normal Triceps: normal normal Brachiloradialis: normal normal Patellar: normal normal Achilles: normal normal  Sensory Sensation was tested at L1 to S1.   Cranial Nerves II. Optic Nerve/Visual Fields: normal III. Oculomotor: normal IV. Trochlear: normal V. Trigeminal: normal VI. Abducens: normal VII. Facial: normal VIII. Acoustic/Vestibular: normal IX. Glossopharyngeal: normal X. Vagus: normal XI. Spinal Accessory: normal XII. Hypoglossal: normal  Motor and other Tests Lhermittes: negative Rhomberg: negative Pronator drift: absent     Right Left Hoffman's: normal normal Clonus: normal normal Babinski: normal normal SLR: positive at 45 degrees negative Patrick's Corky Sox): negative negative Toe Walk: normal normal Toe Lift: normal normal Heel Walk: normal normal SI Joint: nontender nontender      IMPRESSION The patient has intermittent lumbar radiculopathy.  He is finding this to be quite annoying.  Completed Orders (this encounter) Order Details Reason Side Interpretation Result Initial Treatment Date Region  Lifestyle education regarding diet Encouraged to eat a well balanced diet and follow up with primary care physician.        Lumbar Spine- AP/Lat/Obls/Spot/Flex/Ex      06/06/2015 All Levels to All Levels   Assessment/Plan # Detail  Type Description   1. Assessment Spondylolisthesis, lumbar region (M43.16).       2. Assessment Spinal stenosis of lumbar region (M48.06).       3. 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       4. Assessment Body mass index (BMI) 27.0-27.9, adult MW:[id number]).   Plan Orders Today's instructions / counseling include(s) Lifestyle education regarding diet.       5. Assessment Lumbar radiculopathy (M54.16).         Pain Assessment/Treatment Pain Scale: 0/10. Method: Numeric Pain Intensity Scale.  Fall Risk Plan The patient has not fallen in the last year.  I have recommended the patient undergo an MRI of the lumbar spine without gadolinium and follow up with me after that to review this and to discuss his treatment options.  Orders: Diagnostic Procedures: Assessment Procedure  M43.16 MRI Spine/lumb W/o Contrast  M43.16 Return to Clinic after study is performed  M54.16 Lumbar Spine- AP/Lat/Obls/Spot/Flex/Ex  Instruction(s)/Education:  Assessment Instruction  [id number] Lifestyle education regarding diet             Provider:  Marchia Meiers. Vertell Limber MD  06/09/2015 04:21 PM Dictation edited by: Marchia Meiers. Vertell Limber    CC Providers: Erline Levine MD [address] [place], [place] [address]              Electronically signed by Marchia Meiers. Vertell Limber MD on 06/09/2015 04:21 PM

## 2015-07-27 ENCOUNTER — Ambulatory Visit (HOSPITAL_COMMUNITY)
Admission: RE | Admit: 2015-07-27 | Discharge: 2015-07-27 | Disposition: A | Discharge status: Home / Self Care | Payer: Medicare Other | Source: Ambulatory Visit | Attending: Internal Medicine | Admitting: Internal Medicine

## 2015-07-27 DIAGNOSIS — I10 Essential (primary) hypertension: Secondary | ICD-10-CM | POA: Diagnosis not present

## 2015-07-27 DIAGNOSIS — R05 Cough: Secondary | ICD-10-CM | POA: Diagnosis not present

## 2015-07-27 DIAGNOSIS — F17200 Nicotine dependence, unspecified, uncomplicated: Secondary | ICD-10-CM | POA: Insufficient documentation

## 2015-08-01 ENCOUNTER — Encounter (HOSPITAL_COMMUNITY): Payer: Self-pay

## 2015-08-01 ENCOUNTER — Encounter (HOSPITAL_COMMUNITY)
Admission: RE | Admit: 2015-08-01 | Discharge: 2015-08-01 | Disposition: A | Discharge status: Home / Self Care | Payer: Medicare Other | Source: Ambulatory Visit | Attending: Neurosurgery | Admitting: Neurosurgery

## 2015-08-01 DIAGNOSIS — E785 Hyperlipidemia, unspecified: Secondary | ICD-10-CM | POA: Diagnosis not present

## 2015-08-01 DIAGNOSIS — M4806 Spinal stenosis, lumbar region: Secondary | ICD-10-CM | POA: Diagnosis not present

## 2015-08-01 DIAGNOSIS — I252 Old myocardial infarction: Secondary | ICD-10-CM | POA: Insufficient documentation

## 2015-08-01 DIAGNOSIS — I2510 Atherosclerotic heart disease of native coronary artery without angina pectoris: Secondary | ICD-10-CM | POA: Insufficient documentation

## 2015-08-01 DIAGNOSIS — Z01818 Encounter for other preprocedural examination: Secondary | ICD-10-CM | POA: Diagnosis not present

## 2015-08-01 DIAGNOSIS — Z7984 Long term (current) use of oral hypoglycemic drugs: Secondary | ICD-10-CM | POA: Diagnosis not present

## 2015-08-01 DIAGNOSIS — Z955 Presence of coronary angioplasty implant and graft: Secondary | ICD-10-CM | POA: Insufficient documentation

## 2015-08-01 DIAGNOSIS — I10 Essential (primary) hypertension: Secondary | ICD-10-CM | POA: Diagnosis not present

## 2015-08-01 DIAGNOSIS — E119 Type 2 diabetes mellitus without complications: Secondary | ICD-10-CM | POA: Diagnosis not present

## 2015-08-01 DIAGNOSIS — Z01812 Encounter for preprocedural laboratory examination: Secondary | ICD-10-CM | POA: Insufficient documentation

## 2015-08-01 DIAGNOSIS — Z87891 Personal history of nicotine dependence: Secondary | ICD-10-CM | POA: Diagnosis not present

## 2015-08-01 DIAGNOSIS — Z7902 Long term (current) use of antithrombotics/antiplatelets: Secondary | ICD-10-CM | POA: Diagnosis not present

## 2015-08-01 DIAGNOSIS — Z7982 Long term (current) use of aspirin: Secondary | ICD-10-CM | POA: Diagnosis not present

## 2015-08-01 DIAGNOSIS — Z79899 Other long term (current) drug therapy: Secondary | ICD-10-CM | POA: Diagnosis not present

## 2015-08-01 HISTORY — DX: Pneumonia, unspecified organism: J18.9

## 2015-08-01 LAB — COMPREHENSIVE METABOLIC PANEL
ALBUMIN: 3.8 g/dL (ref 3.5–5.0)
ALT: 22 U/L (ref 17–63)
AST: 21 U/L (ref 15–41)
Alkaline Phosphatase: 64 U/L (ref 38–126)
Anion gap: 8 (ref 5–15)
BILIRUBIN TOTAL: 0.6 mg/dL (ref 0.3–1.2)
BUN: 10 mg/dL (ref 6–20)
CHLORIDE: 103 mmol/L (ref 101–111)
CO2: 28 mmol/L (ref 22–32)
CREATININE: 0.89 mg/dL (ref 0.61–1.24)
Calcium: 9.8 mg/dL (ref 8.9–10.3)
GFR calc Af Amer: >60 mL/min (ref >60)
GFR calc non Af Amer: >60 mL/min (ref >60)
GLUCOSE: 99 mg/dL (ref 65–99)
Potassium: 4.3 mmol/L (ref 3.5–5.1)
Sodium: 139 mmol/L (ref 135–145)
Total Protein: 7.1 g/dL (ref 6.5–8.1)

## 2015-08-01 LAB — CBC
HEMATOCRIT: 46.4 % (ref 39.0–52.0)
HEMOGLOBIN: 15.1 g/dL (ref 13.0–17.0)
MCH: 30.8 pg (ref 26.0–34.0)
MCHC: 32.5 g/dL (ref 30.0–36.0)
MCV: 94.5 fL (ref 78.0–100.0)
Platelets: 244 10*3/uL (ref 150–400)
RBC: 4.91 MIL/uL (ref 4.22–5.81)
RDW: 12.9 % (ref 11.5–15.5)
WBC: 8.6 10*3/uL (ref 4.0–10.5)

## 2015-08-01 LAB — SURGICAL PCR SCREEN
MRSA, PCR: NEGATIVE
STAPHYLOCOCCUS AUREUS: NEGATIVE

## 2015-08-01 LAB — GLUCOSE, CAPILLARY: Glucose-Capillary: 113 mg/dL — ABNORMAL HIGH (ref 65–99)

## 2015-08-01 NOTE — Progress Notes (Addendum)
Anesthesia Chart Review:  Pt is 80 year old male scheduled for L4-S1 decompressive lumbar laminectomy on 08/06/2015 with Dr. Vertell Limber.   Cardiologist is Dr. Glenetta Hew (cardiac clearance can be found in media tab dated 06/19/15).   PMH includes:  CAD (a. 12/2009 - NSTEMI: PTCA proximal LAD, DES to distal LAD. b. cath 09/11/10: Prox LAD 60-70% pre-stent with FFR 0.82, 90% RVM -- Med Rx, EF 50-55%), HTN, DM, hyperlipidemia. Current smoker. BMI 27.   Medications include: ASA, plavix, lisinopril, metformin, zetia. PT to hold plavix for 7 days prior to surgery.   Preoperative labs reviewed.  Glucose 99, hgbA1c 6.2.   Chest x-ray 07/27/15 reviewed. Improving left basilar opacity with residual left lingular density. This could reflect scarring or residual pneumonia. Recommend continued follow-up with repeat chest x-ray in 4-6 weeks.  Pt treated for pneumonia 05/2015. I called and spoke with pt. He reports he still has residual cough with clear sputum, but does not feel sick any more. Denies SOB, denies fever.   EKG 02/16/15: NSR. LAD/LAFB.   Cardiac cath 09/11/10:  1. Culprit lesion was likely the 90% ostial lesion in the right ventricular marginal branch. This was not a lesion or vessel that is usually amenable to percutaneous intervention, and therefore it is best for medical management. 2. Diffuse moderate, but nonobstructive coronary artery disease and wide patent stent in the left anterior descending. The conglomerate fractional flow reserve in the proximal and mid portion of the LAD is 0.81, which is not hemodynamically significant. 3. Low normal ejection fraction of 50-55% with relatively normal left ventricular end-diastolic pressure.  Echo 01/01/10:  - Left ventricle: The cavity size was moderately dilated. Wall thickness was normal. Moderate hypokinesis of the anteroseptal and anterior myocardium; consistent with infarction. Doppler parameters are consistent with abnormal left ventricular  relaxation (grade 1 diastolic dysfunction). - Left atrium: The atrium was mildly to moderately dilated. - Atrial septum: No defect or patent foramen ovale was identified.  Pt will need further evaluation by assigned anesthesiologist DOS. If stable from respiratory standpoint, I anticipate he can proceed as scheduled.   Willeen Cass, FNP-BC [facility]/Anesthesiology Phone: [phone] 08/02/2015 1:25 PM

## 2015-08-01 NOTE — Pre-Procedure Instructions (Signed)
David [name]  08/01/2015      [id number] - [place], [place] - [address] [place] [address] Phone: [phone] Fax: [phone]    Your procedure is scheduled on Thursday  08/09/15  Report to [facility] at 800 A.M.  Call this number if you have problems the morning of surgery:  [phone]   Remember:  Do not eat food or drink liquids after midnight.  Take these medicines the morning of surgery with A SIP OF WATER  - FINASTERIDE(PROSCAR) (STOP ASPIRIN, PLAVIX, HERBAL MEDICINES LIKE CINNAMON+ VIT D) How to Manage Your Diabetes Before Surgery   Why is it important to control my blood sugar before and after surgery?   Improving blood sugar levels before and after surgery helps healing and can limit problems.  A way of improving blood sugar control is eating a healthy diet by:  - Eating less sugar and carbohydrates  - Increasing activity/exercise  - Talk with your doctor about reaching your blood sugar goals  High blood sugars (greater than 180 mg/dL) can raise your risk of infections and slow down your recovery so you will need to focus on controlling your diabetes during the weeks before surgery.  Make sure that the doctor who takes care of your diabetes knows about your planned surgery including the date and location.  How do I manage my blood sugars before surgery?   Check your blood sugar at least 4 times a day, 2 days before surgery to make sure that they are not too high or low.   Check your blood sugar the morning of your surgery when you wake up and every 2               hours until you get to the Short-Stay unit.  If your blood sugar is less than 70 mg/dL, you will need to treat for low blood sugar by:  Treat a low blood sugar (less than 70 mg/dL) with 1/2 cup of clear juice (cranberry or apple), 4 glucose tablets, OR glucose gel.  Recheck blood sugar in 15  minutes after treatment (to make sure it is greater than 70 mg/dL).  If blood sugar is not greater than 70 mg/dL on re-check, call [phone] for further instructions.   Report your blood sugar to the Short-Stay nurse when you get to Short-Stay.  References:  [facility], 2007 "How to Manage your Diabetes Before and After Surgery".  What do I do about my diabetes medications?   Do not take oral diabetes medicines (pills) the morning of surgery.    If your CBG is greater than 220 mg/dL, you may take 1/2 of your sliding scale (correction) dose of insulin.   For patients with "Insulin Pumps":  Contact your diabetes doctor for specific instructions before surgery.   Decrease basal insulin rates by 20% at midnight the night before surgery.  Note that if your surgery is planned to be longer than 2 hours, your insulin pump will be removed and intravenous (IV) insulin will be started and managed by the nurses and anesthesiologist.  You will be able to restart your insulin pump once you are awake and able to manage it.  Make sure to bring insulin pump supplies to the hospital with you in case your site needs to be changed.        Do not wear jewelry, make-up or  nail polish.  Do not wear lotions, powders, or perfumes.  You may wear deodorant.  Do not shave 48 hours prior to surgery.  Men may shave face and neck.  Do not bring valuables to the hospital.  [facility] is not responsible for any belongings or valuables.  Contacts, dentures or bridgework may not be worn into surgery.  Leave your suitcase in the car.  After surgery it may be brought to your room.  For patients admitted to the hospital, discharge time will be determined by your treatment team.  Patients discharged the day of surgery will not be allowed to drive home.   Name and phone number of your driver:    Special instructions:  SEE PREPARING FOR SURGERY  Please read over the following fact  sheets that you were given. Pain Booklet, Coughing and Deep Breathing, MRSA Information and Surgical Site Infection Prevention

## 2015-08-02 LAB — HEMOGLOBIN A1C
HEMOGLOBIN A1C: 6.2 % — AB (ref 4.8–5.6)
MEAN PLASMA GLUCOSE: 131 mg/dL

## 2015-08-08 NOTE — Discharge Instructions (Signed)

## 2015-08-09 ENCOUNTER — Inpatient Hospital Stay (HOSPITAL_COMMUNITY): Payer: Medicare Other

## 2015-08-09 ENCOUNTER — Encounter (HOSPITAL_COMMUNITY): Payer: Self-pay | Admitting: *Deleted

## 2015-08-09 ENCOUNTER — Inpatient Hospital Stay (HOSPITAL_COMMUNITY): Payer: Medicare Other | Admitting: Anesthesiology

## 2015-08-09 ENCOUNTER — Inpatient Hospital Stay (HOSPITAL_COMMUNITY)
Admission: RE | Admit: 2015-08-09 | Discharge: 2015-08-10 | DRG: 520 | Disposition: A | Discharge status: Home / Self Care | Payer: Medicare Other | Source: Ambulatory Visit | Attending: Neurosurgery | Admitting: Neurosurgery

## 2015-08-09 ENCOUNTER — Inpatient Hospital Stay (HOSPITAL_COMMUNITY): Payer: Medicare Other | Admitting: Emergency Medicine

## 2015-08-09 ENCOUNTER — Encounter (HOSPITAL_COMMUNITY)
Admission: RE | Disposition: A | Discharge status: Home / Self Care | Payer: Self-pay | Source: Ambulatory Visit | Attending: Neurosurgery

## 2015-08-09 DIAGNOSIS — M4606 Spinal enthesopathy, lumbar region: Secondary | ICD-10-CM | POA: Diagnosis present

## 2015-08-09 DIAGNOSIS — M5416 Radiculopathy, lumbar region: Secondary | ICD-10-CM | POA: Diagnosis present

## 2015-08-09 DIAGNOSIS — Z720 Tobacco use: Secondary | ICD-10-CM | POA: Diagnosis not present

## 2015-08-09 DIAGNOSIS — M4807 Spinal stenosis, lumbosacral region: Secondary | ICD-10-CM | POA: Diagnosis present

## 2015-08-09 DIAGNOSIS — M4316 Spondylolisthesis, lumbar region: Secondary | ICD-10-CM | POA: Diagnosis present

## 2015-08-09 DIAGNOSIS — Z7984 Long term (current) use of oral hypoglycemic drugs: Secondary | ICD-10-CM

## 2015-08-09 DIAGNOSIS — M79604 Pain in right leg: Secondary | ICD-10-CM | POA: Diagnosis not present

## 2015-08-09 DIAGNOSIS — M4806 Spinal stenosis, lumbar region: Secondary | ICD-10-CM | POA: Diagnosis not present

## 2015-08-09 DIAGNOSIS — Z955 Presence of coronary angioplasty implant and graft: Secondary | ICD-10-CM

## 2015-08-09 DIAGNOSIS — E785 Hyperlipidemia, unspecified: Secondary | ICD-10-CM | POA: Diagnosis present

## 2015-08-09 DIAGNOSIS — Z7902 Long term (current) use of antithrombotics/antiplatelets: Secondary | ICD-10-CM

## 2015-08-09 DIAGNOSIS — E119 Type 2 diabetes mellitus without complications: Secondary | ICD-10-CM | POA: Diagnosis not present

## 2015-08-09 DIAGNOSIS — M7138 Other bursal cyst, other site: Secondary | ICD-10-CM | POA: Diagnosis present

## 2015-08-09 DIAGNOSIS — I252 Old myocardial infarction: Secondary | ICD-10-CM | POA: Diagnosis not present

## 2015-08-09 DIAGNOSIS — I10 Essential (primary) hypertension: Secondary | ICD-10-CM | POA: Diagnosis not present

## 2015-08-09 DIAGNOSIS — M7130 Other bursal cyst, unspecified site: Secondary | ICD-10-CM | POA: Diagnosis present

## 2015-08-09 DIAGNOSIS — M4326 Fusion of spine, lumbar region: Secondary | ICD-10-CM | POA: Diagnosis not present

## 2015-08-09 DIAGNOSIS — Z419 Encounter for procedure for purposes other than remedying health state, unspecified: Secondary | ICD-10-CM

## 2015-08-09 HISTORY — PX: LUMBAR LAMINECTOMY/DECOMPRESSION MICRODISCECTOMY: SHX5026

## 2015-08-09 LAB — GLUCOSE, CAPILLARY
GLUCOSE-CAPILLARY: 120 mg/dL — AB (ref 65–99)
GLUCOSE-CAPILLARY: 109 mg/dL — AB (ref 65–99)
GLUCOSE-CAPILLARY: 92 mg/dL (ref 65–99)
GLUCOSE-CAPILLARY: 173 mg/dL — AB (ref 65–99)
Glucose-Capillary: 95 mg/dL (ref 65–99)

## 2015-08-09 SURGERY — LUMBAR LAMINECTOMY/DECOMPRESSION MICRODISCECTOMY 2 LEVELS
Anesthesia: General | Site: Spine Lumbar

## 2015-08-09 MED ORDER — EPHEDRINE SULFATE 50 MG/ML IJ SOLN
INTRAMUSCULAR | Status: DC | PRN
  Administered 2015-08-09: 5 mg via INTRAVENOUS

## 2015-08-09 MED ORDER — HYDROCODONE-ACETAMINOPHEN 5-325 MG PO TABS: 1.0000 | ORAL_TABLET | ORAL | Status: DC | PRN

## 2015-08-09 MED ORDER — PROPOFOL 10 MG/ML IV BOLUS
INTRAVENOUS | Status: DC | PRN
  Administered 2015-08-09: 150 mg via INTRAVENOUS

## 2015-08-09 MED ORDER — METFORMIN HCL ER 500 MG PO TB24
1000.0000 mg | ORAL_TABLET | Freq: Two times a day (BID) | ORAL | Status: DC
  Administered 2015-08-09 – 2015-08-10 (×2): 1000 mg via ORAL
  Filled 2015-08-09 (×2): qty 2

## 2015-08-09 MED ORDER — LIDOCAINE HCL (CARDIAC) 20 MG/ML IV SOLN
INTRAVENOUS | Status: DC | PRN
  Administered 2015-08-09: 100 mg via INTRAVENOUS

## 2015-08-09 MED ORDER — LACTATED RINGERS IV SOLN
INTRAVENOUS | Status: DC
  Administered 2015-08-09: 08:00:00 via INTRAVENOUS

## 2015-08-09 MED ORDER — MIDAZOLAM HCL 5 MG/5ML IJ SOLN
INTRAMUSCULAR | Status: DC | PRN
  Administered 2015-08-09 (×2): 1 mg via INTRAVENOUS

## 2015-08-09 MED ORDER — FINASTERIDE 5 MG PO TABS
5.0000 mg | ORAL_TABLET | Freq: Every day | ORAL | Status: DC
  Administered 2015-08-10: 5 mg via ORAL
  Filled 2015-08-09: qty 1

## 2015-08-09 MED ORDER — BISACODYL 10 MG RE SUPP: 10.0000 mg | Freq: Every day | RECTAL | Status: DC | PRN

## 2015-08-09 MED ORDER — ACETAMINOPHEN 650 MG RE SUPP: 650.0000 mg | RECTAL | Status: DC | PRN

## 2015-08-09 MED ORDER — BUPIVACAINE HCL (PF) 0.5 % IJ SOLN
INTRAMUSCULAR | Status: DC | PRN
  Administered 2015-08-09: 5 mL

## 2015-08-09 MED ORDER — 0.9 % SODIUM CHLORIDE (POUR BTL) OPTIME
TOPICAL | Status: DC | PRN
  Administered 2015-08-09: 1000 mL

## 2015-08-09 MED ORDER — ROCURONIUM BROMIDE 100 MG/10ML IV SOLN
INTRAVENOUS | Status: DC | PRN
  Administered 2015-08-09: 50 mg via INTRAVENOUS

## 2015-08-09 MED ORDER — ARTIFICIAL TEARS OP OINT
TOPICAL_OINTMENT | OPHTHALMIC | Status: DC | PRN
  Administered 2015-08-09: 1 via OPHTHALMIC

## 2015-08-09 MED ORDER — THROMBIN 5000 UNITS EX SOLR
CUTANEOUS | Status: DC | PRN
Start: 2015-08-09 — End: 2015-08-09
  Administered 2015-08-09: 5000 [IU] via TOPICAL

## 2015-08-09 MED ORDER — ARTIFICIAL TEARS OP OINT
TOPICAL_OINTMENT | OPHTHALMIC | Status: AC
  Filled 2015-08-09: qty 7

## 2015-08-09 MED ORDER — SODIUM CHLORIDE 0.9 % IJ SOLN: 3.0000 mL | INTRAMUSCULAR | Status: DC | PRN

## 2015-08-09 MED ORDER — PANTOPRAZOLE SODIUM 40 MG IV SOLR: 40.0000 mg | Freq: Every day | INTRAVENOUS | Status: DC

## 2015-08-09 MED ORDER — PHENOL 1.4 % MT LIQD: 1.0000 | OROMUCOSAL | Status: DC | PRN

## 2015-08-09 MED ORDER — EPHEDRINE SULFATE 50 MG/ML IJ SOLN
INTRAMUSCULAR | Status: AC
  Filled 2015-08-09: qty 1

## 2015-08-09 MED ORDER — PHENYLEPHRINE 40 MCG/ML (10ML) SYRINGE FOR IV PUSH (FOR BLOOD PRESSURE SUPPORT)
PREFILLED_SYRINGE | INTRAVENOUS | Status: AC
  Filled 2015-08-09: qty 30

## 2015-08-09 MED ORDER — ACETAMINOPHEN 325 MG PO TABS: 650.0000 mg | ORAL_TABLET | ORAL | Status: DC | PRN

## 2015-08-09 MED ORDER — ASPIRIN EC 81 MG PO TBEC
81.0000 mg | DELAYED_RELEASE_TABLET | ORAL | Status: DC
  Administered 2015-08-10: 81 mg via ORAL
  Filled 2015-08-09: qty 1

## 2015-08-09 MED ORDER — LIDOCAINE-EPINEPHRINE 1 %-1:100000 IJ SOLN
INTRAMUSCULAR | Status: DC | PRN
  Administered 2015-08-09: 5 mL

## 2015-08-09 MED ORDER — CINNAMON 500 MG PO CAPS: 1000.0000 mg | ORAL_CAPSULE | Freq: Every day | ORAL | Status: DC

## 2015-08-09 MED ORDER — EZETIMIBE 10 MG PO TABS
10.0000 mg | ORAL_TABLET | Freq: Every day | ORAL | Status: DC
  Administered 2015-08-09 – 2015-08-10 (×2): 10 mg via ORAL
  Filled 2015-08-09 (×2): qty 1

## 2015-08-09 MED ORDER — HEMOSTATIC AGENTS (NO CHARGE) OPTIME
TOPICAL | Status: DC | PRN
  Administered 2015-08-09: 1 via TOPICAL

## 2015-08-09 MED ORDER — PHENYLEPHRINE HCL 10 MG/ML IJ SOLN
10.0000 mg | INTRAVENOUS | Status: DC | PRN
  Administered 2015-08-09: 50 ug/min via INTRAVENOUS

## 2015-08-09 MED ORDER — FLEET ENEMA 7-19 GM/118ML RE ENEM: 1.0000 | ENEMA | Freq: Once | RECTAL | Status: DC | PRN

## 2015-08-09 MED ORDER — PROPOFOL 10 MG/ML IV BOLUS
INTRAVENOUS | Status: AC
  Filled 2015-08-09: qty 20

## 2015-08-09 MED ORDER — MIDAZOLAM HCL 2 MG/2ML IJ SOLN
INTRAMUSCULAR | Status: AC
  Filled 2015-08-09: qty 2

## 2015-08-09 MED ORDER — FENTANYL CITRATE (PF) 100 MCG/2ML IJ SOLN
INTRAMUSCULAR | Status: AC
  Filled 2015-08-09: qty 2

## 2015-08-09 MED ORDER — METHOCARBAMOL 500 MG PO TABS
500.0000 mg | ORAL_TABLET | Freq: Four times a day (QID) | ORAL | Status: DC | PRN
  Administered 2015-08-09 – 2015-08-10 (×2): 500 mg via ORAL
  Filled 2015-08-09 (×2): qty 1

## 2015-08-09 MED ORDER — PHENYLEPHRINE HCL 10 MG/ML IJ SOLN
INTRAMUSCULAR | Status: DC | PRN
Start: 2015-08-09 — End: 2015-08-09
  Administered 2015-08-09 (×4): 80 ug via INTRAVENOUS

## 2015-08-09 MED ORDER — GLYCOPYRROLATE 0.2 MG/ML IJ SOLN
INTRAMUSCULAR | Status: DC | PRN
  Administered 2015-08-09: 0.6 mg via INTRAVENOUS

## 2015-08-09 MED ORDER — ONDANSETRON HCL 4 MG/2ML IJ SOLN
INTRAMUSCULAR | Status: AC
  Filled 2015-08-09: qty 4

## 2015-08-09 MED ORDER — KCL IN DEXTROSE-NACL 20-5-0.45 MEQ/L-%-% IV SOLN: INTRAVENOUS | Status: DC

## 2015-08-09 MED ORDER — HYDROMORPHONE HCL 1 MG/ML IJ SOLN: 0.5000 mg | INTRAMUSCULAR | Status: DC | PRN

## 2015-08-09 MED ORDER — MAGNESIUM 500 MG PO CAPS: 1.0000 | ORAL_CAPSULE | Freq: Every day | ORAL | Status: DC

## 2015-08-09 MED ORDER — PANTOPRAZOLE SODIUM 40 MG PO TBEC
40.0000 mg | DELAYED_RELEASE_TABLET | Freq: Every day | ORAL | Status: DC
  Administered 2015-08-09 – 2015-08-10 (×2): 40 mg via ORAL
  Filled 2015-08-09 (×2): qty 1

## 2015-08-09 MED ORDER — DOCUSATE SODIUM 100 MG PO CAPS
100.0000 mg | ORAL_CAPSULE | Freq: Two times a day (BID) | ORAL | Status: DC
  Administered 2015-08-09 – 2015-08-10 (×2): 100 mg via ORAL
  Filled 2015-08-09 (×2): qty 1

## 2015-08-09 MED ORDER — POLYETHYLENE GLYCOL 3350 17 G PO PACK: 17.0000 g | PACK | Freq: Every day | ORAL | Status: DC | PRN

## 2015-08-09 MED ORDER — SODIUM CHLORIDE 0.9 % IJ SOLN
3.0000 mL | Freq: Two times a day (BID) | INTRAMUSCULAR | Status: DC
  Administered 2015-08-09: 3 mL via INTRAVENOUS

## 2015-08-09 MED ORDER — VITAMIN D3 25 MCG (1000 UNIT) PO TABS
2000.0000 [IU] | ORAL_TABLET | Freq: Every day | ORAL | Status: DC
Start: 2015-08-09 — End: 2015-08-10
  Administered 2015-08-09 – 2015-08-10 (×2): 2000 [IU] via ORAL
  Filled 2015-08-09 (×2): qty 2

## 2015-08-09 MED ORDER — CEFAZOLIN SODIUM 1-5 GM-% IV SOLN
1.0000 g | Freq: Three times a day (TID) | INTRAVENOUS | Status: AC
  Administered 2015-08-09 – 2015-08-10 (×2): 1 g via INTRAVENOUS
  Filled 2015-08-09 (×2): qty 50

## 2015-08-09 MED ORDER — FENTANYL CITRATE (PF) 100 MCG/2ML IJ SOLN
25.0000 ug | INTRAMUSCULAR | Status: DC | PRN
  Administered 2015-08-09 (×4): 25 ug via INTRAVENOUS

## 2015-08-09 MED ORDER — DEXTROSE 5 % IV SOLN
500.0000 mg | Freq: Four times a day (QID) | INTRAVENOUS | Status: DC | PRN
  Filled 2015-08-09: qty 5

## 2015-08-09 MED ORDER — MAGNESIUM OXIDE 400 (241.3 MG) MG PO TABS
400.0000 mg | ORAL_TABLET | Freq: Every day | ORAL | Status: DC
  Administered 2015-08-09 – 2015-08-10 (×2): 400 mg via ORAL
  Filled 2015-08-09 (×2): qty 1

## 2015-08-09 MED ORDER — LISINOPRIL 5 MG PO TABS
5.0000 mg | ORAL_TABLET | Freq: Every day | ORAL | Status: DC
  Administered 2015-08-09: 5 mg via ORAL
  Filled 2015-08-09 (×2): qty 1

## 2015-08-09 MED ORDER — GLYCOPYRROLATE 0.2 MG/ML IJ SOLN
INTRAMUSCULAR | Status: AC
  Filled 2015-08-09: qty 6

## 2015-08-09 MED ORDER — ROCURONIUM BROMIDE 50 MG/5ML IV SOLN
INTRAVENOUS | Status: AC
  Filled 2015-08-09: qty 2

## 2015-08-09 MED ORDER — ONDANSETRON HCL 4 MG/2ML IJ SOLN: 4.0000 mg | INTRAMUSCULAR | Status: DC | PRN

## 2015-08-09 MED ORDER — ONDANSETRON HCL 4 MG/2ML IJ SOLN
INTRAMUSCULAR | Status: DC | PRN
  Administered 2015-08-09: 4 mg via INTRAVENOUS

## 2015-08-09 MED ORDER — SODIUM CHLORIDE 0.9 % IJ SOLN
INTRAMUSCULAR | Status: AC
  Filled 2015-08-09: qty 10

## 2015-08-09 MED ORDER — FENTANYL CITRATE (PF) 250 MCG/5ML IJ SOLN
INTRAMUSCULAR | Status: AC
  Filled 2015-08-09: qty 5

## 2015-08-09 MED ORDER — DEXAMETHASONE SODIUM PHOSPHATE 4 MG/ML IJ SOLN
INTRAMUSCULAR | Status: AC
  Filled 2015-08-09: qty 3

## 2015-08-09 MED ORDER — DOXAZOSIN MESYLATE 4 MG PO TABS
4.0000 mg | ORAL_TABLET | Freq: Every day | ORAL | Status: DC
  Administered 2015-08-09: 4 mg via ORAL
  Filled 2015-08-09 (×2): qty 1

## 2015-08-09 MED ORDER — OXYCODONE-ACETAMINOPHEN 5-325 MG PO TABS
1.0000 | ORAL_TABLET | ORAL | Status: DC | PRN
Start: 2015-08-09 — End: 2015-08-10
  Administered 2015-08-09 – 2015-08-10 (×2): 1 via ORAL
  Filled 2015-08-09 (×2): qty 1

## 2015-08-09 MED ORDER — PHENYLEPHRINE HCL 10 MG/ML IJ SOLN
INTRAMUSCULAR | Status: AC
  Filled 2015-08-09: qty 1

## 2015-08-09 MED ORDER — MENTHOL 3 MG MT LOZG: 1.0000 | LOZENGE | OROMUCOSAL | Status: DC | PRN

## 2015-08-09 MED ORDER — NEOSTIGMINE METHYLSULFATE 10 MG/10ML IV SOLN
INTRAVENOUS | Status: DC | PRN
  Administered 2015-08-09: 4 mg via INTRAVENOUS

## 2015-08-09 MED ORDER — LACTATED RINGERS IV SOLN
INTRAVENOUS | Status: DC | PRN
  Administered 2015-08-09 (×3): via INTRAVENOUS

## 2015-08-09 MED ORDER — FENTANYL CITRATE (PF) 100 MCG/2ML IJ SOLN
INTRAMUSCULAR | Status: DC | PRN
  Administered 2015-08-09: 50 ug via INTRAVENOUS
  Administered 2015-08-09: 150 ug via INTRAVENOUS
  Administered 2015-08-09: 50 ug via INTRAVENOUS

## 2015-08-09 MED ORDER — ALUM & MAG HYDROXIDE-SIMETH 200-200-20 MG/5ML PO SUSP: 30.0000 mL | Freq: Four times a day (QID) | ORAL | Status: DC | PRN

## 2015-08-09 MED ORDER — LIDOCAINE HCL (CARDIAC) 20 MG/ML IV SOLN
INTRAVENOUS | Status: AC
  Filled 2015-08-09: qty 15

## 2015-08-09 MED ORDER — CEFAZOLIN SODIUM-DEXTROSE 2-3 GM-% IV SOLR
2.0000 g | INTRAVENOUS | Status: AC
  Administered 2015-08-09: 2 g via INTRAVENOUS
  Filled 2015-08-09: qty 50

## 2015-08-09 SURGICAL SUPPLY — 60 items
ADH SKN CLS APL DERMABOND .7 (GAUZE/BANDAGES/DRESSINGS) ×1
APL SKNCLS STERI-STRIP NONHPOA (GAUZE/BANDAGES/DRESSINGS)
BENZOIN TINCTURE PRP APPL 2/3 (GAUZE/BANDAGES/DRESSINGS) IMPLANT
BIT DRILL NEURO 2X3.1 SFT TUCH (MISCELLANEOUS) ×1 IMPLANT
BLADE CLIPPER SURG (BLADE) IMPLANT
BUR ROUND FLUTED 5 RND (BURR) ×2 IMPLANT
CANISTER SUCT 3000ML PPV (MISCELLANEOUS) ×2 IMPLANT
DECANTER SPIKE VIAL GLASS SM (MISCELLANEOUS) ×2 IMPLANT
DERMABOND ADVANCED (GAUZE/BANDAGES/DRESSINGS) ×1
DERMABOND ADVANCED .7 DNX12 (GAUZE/BANDAGES/DRESSINGS) ×1 IMPLANT
DRAPE LAPAROTOMY 100X72X124 (DRAPES) ×2 IMPLANT
DRAPE MICROSCOPE LEICA (MISCELLANEOUS) ×2 IMPLANT
DRAPE POUCH INSTRU U-SHP 10X18 (DRAPES) ×2 IMPLANT
DRAPE SURG 17X23 STRL (DRAPES) ×2 IMPLANT
DRILL NEURO 2X3.1 SOFT TOUCH (MISCELLANEOUS) ×2
DRSG OPSITE POSTOP 4X6 (GAUZE/BANDAGES/DRESSINGS) ×1 IMPLANT
DURAPREP 26ML APPLICATOR (WOUND CARE) ×2 IMPLANT
ELECT REM PT RETURN 9FT ADLT (ELECTROSURGICAL) ×2
ELECTRODE REM PT RTRN 9FT ADLT (ELECTROSURGICAL) ×1 IMPLANT
GAUZE SPONGE 4X4 12PLY STRL (GAUZE/BANDAGES/DRESSINGS) IMPLANT
GAUZE SPONGE 4X4 16PLY XRAY LF (GAUZE/BANDAGES/DRESSINGS) IMPLANT
GLOVE BIO SURGEON STRL SZ8 (GLOVE) ×2 IMPLANT
GLOVE BIOGEL PI IND STRL 7.5 (GLOVE) IMPLANT
GLOVE BIOGEL PI IND STRL 8 (GLOVE) ×1 IMPLANT
GLOVE BIOGEL PI IND STRL 8.5 (GLOVE) ×1 IMPLANT
GLOVE BIOGEL PI INDICATOR 7.5 (GLOVE) ×1
GLOVE BIOGEL PI INDICATOR 8 (GLOVE) ×1
GLOVE BIOGEL PI INDICATOR 8.5 (GLOVE) ×1
GLOVE ECLIPSE 8.0 STRL XLNG CF (GLOVE) ×3 IMPLANT
GLOVE EXAM NITRILE LRG STRL (GLOVE) IMPLANT
GLOVE EXAM NITRILE MD LF STRL (GLOVE) IMPLANT
GLOVE EXAM NITRILE XL STR (GLOVE) IMPLANT
GLOVE EXAM NITRILE XS STR PU (GLOVE) IMPLANT
GLOVE SURG SS PI 7.0 STRL IVOR (GLOVE) ×2 IMPLANT
GOWN STRL REUS W/ TWL LRG LVL3 (GOWN DISPOSABLE) IMPLANT
GOWN STRL REUS W/ TWL XL LVL3 (GOWN DISPOSABLE) ×1 IMPLANT
GOWN STRL REUS W/TWL 2XL LVL3 (GOWN DISPOSABLE) ×2 IMPLANT
GOWN STRL REUS W/TWL LRG LVL3 (GOWN DISPOSABLE)
GOWN STRL REUS W/TWL XL LVL3 (GOWN DISPOSABLE) ×4
KIT BASIN OR (CUSTOM PROCEDURE TRAY) ×2 IMPLANT
KIT ROOM TURNOVER OR (KITS) ×2 IMPLANT
NDL HYPO 18GX1.5 BLUNT FILL (NEEDLE) IMPLANT
NDL HYPO 25X1 1.5 SAFETY (NEEDLE) ×1 IMPLANT
NEEDLE HYPO 18GX1.5 BLUNT FILL (NEEDLE) IMPLANT
NEEDLE HYPO 25X1 1.5 SAFETY (NEEDLE) ×2 IMPLANT
NS IRRIG 1000ML POUR BTL (IV SOLUTION) ×2 IMPLANT
PACK LAMINECTOMY NEURO (CUSTOM PROCEDURE TRAY) ×2 IMPLANT
PAD ARMBOARD 7.5X6 YLW CONV (MISCELLANEOUS) ×6 IMPLANT
RUBBERBAND STERILE (MISCELLANEOUS) ×4 IMPLANT
SPONGE SURGIFOAM ABS GEL SZ50 (HEMOSTASIS) ×2 IMPLANT
STAPLER SKIN PROX WIDE 3.9 (STAPLE) IMPLANT
STRIP CLOSURE SKIN 1/2X4 (GAUZE/BANDAGES/DRESSINGS) IMPLANT
SUT VIC AB 0 CT1 18XCR BRD8 (SUTURE) ×1 IMPLANT
SUT VIC AB 0 CT1 8-18 (SUTURE) ×2
SUT VIC AB 2-0 CT1 18 (SUTURE) ×2 IMPLANT
SUT VIC AB 3-0 SH 8-18 (SUTURE) ×2 IMPLANT
SYR 5ML LL (SYRINGE) IMPLANT
TOWEL OR 17X24 6PK STRL BLUE (TOWEL DISPOSABLE) ×2 IMPLANT
TOWEL OR 17X26 10 PK STRL BLUE (TOWEL DISPOSABLE) ×2 IMPLANT
WATER STERILE IRR 1000ML POUR (IV SOLUTION) ×2 IMPLANT

## 2015-08-09 NOTE — Interval H&P Note (Signed)
History and Physical Interval Note:  08/09/2015 7:20 AM  David [name]  has presented today for surgery, with the diagnosis of Spinal stenosis of lumbar region  The various methods of treatment have been discussed with the patient and family. After consideration of risks, benefits and other options for treatment, the patient has consented to  Procedure(s) with comments: L4 to S1 Decompressive Lumbar Laminectomy (N/A) - L4 to S1 Decompressive Lumbar Laminectomy as a surgical intervention .  The patient's history has been reviewed, patient examined, no change in status, stable for surgery.  I have reviewed the patient's chart and labs.  Questions were answered to the patient's satisfaction.     Lopaka Karge D

## 2015-08-09 NOTE — Anesthesia Procedure Notes (Signed)
Procedure Name: Intubation Date/Time: 08/09/2015 10:31 AM Performed by: Suzy Bouchard Pre-anesthesia Checklist: Suction available, Patient being monitored, Patient identified, Timeout performed and Emergency Drugs available Patient Re-evaluated:Patient Re-evaluated prior to inductionOxygen Delivery Method: Circle system utilized Preoxygenation: Pre-oxygenation with 100% oxygen Intubation Type: IV induction Ventilation: Mask ventilation without difficulty Laryngoscope Size: Glidescope, 3, 2 and Miller (Mil 2; then glidescope) Grade View: Grade I Tube type: Oral Tube size: 7.5 mm Number of attempts: 2 Airway Equipment and Method: Stylet Secured at: 22 cm Tube secured with: Tape Dental Injury: Teeth and Oropharynx as per pre-operative assessment  Comments: DL x1 with Miller 2; unable to visualize cords.  DL with glidescope and intubated successfully.

## 2015-08-09 NOTE — Anesthesia Preprocedure Evaluation (Addendum)
Anesthesia Evaluation  Patient identified by MRN, date of birth, ID band Patient awake    Reviewed: Allergy & Precautions, H&P , Patient's Chart, lab work & pertinent test results, reviewed documented beta blocker date and time   Airway Mallampati: II  TM Distance: >3 FB Neck ROM: full    Dental no notable dental hx. (+) Poor Dentition, Dental Advisory Given, Caps Upper caps; lower front teeth very worn.  Denies loose teeth:   Pulmonary Current Smoker,    Pulmonary exam normal breath sounds clear to auscultation       Cardiovascular hypertension,  Rhythm:regular Rate:Normal     Neuro/Psych    GI/Hepatic   Endo/Other  diabetes  Renal/GU      Musculoskeletal   Abdominal   Peds  Hematology   Anesthesia Other Findings Non-ST elevation MI (NSTEMI) [facility]) June 2011  PCI to LAD  CAD S/P percutaneous coronary angioplasty 12/2009; 08/2010 a) 6/'22 - NSTEMI: PCI to LAD: Promus Element 2.5 mm x 15 mm DES;  ( Cath for Angina: Prox LAD 60-70% pre-stent with FFR 0.82, 90% RVM -- Med Rx, EF 50-55% )  Off Plavix for 7 ndays   Essential hypertension    Hyperlipidemia ....Marland KitchenMarland KitchenStatin intolerance   Type 2 diabetes mellitus without complications (HCC)  BPH (benign prostatic hyperplasia)  URI/Pneumonia in Dec; feels good now+ chest clear            Reproductive/Obstetrics                          Anesthesia Physical Anesthesia Plan  ASA: II  Anesthesia Plan: General   Post-op Pain Management:    Induction: Intravenous  Airway Management Planned: Oral ETT  Additional Equipment:   Intra-op Plan:   Post-operative Plan: Extubation in OR  Informed Consent: I have reviewed the patients History and Physical, chart, labs and discussed the procedure including the risks, benefits and alternatives for the proposed anesthesia with the patient or authorized representative who has indicated his/her  understanding and acceptance.   Dental Advisory Given and Dental advisory given  Plan Discussed with: CRNA and Surgeon  Anesthesia Plan Comments: (  Discussed general anesthesia, including possible nausea, instrumentation of airway, sore throat,pulmonary aspiration, etc. I asked if the were any outstanding questions, or  concerns before we proceeded. )        Anesthesia Quick Evaluation

## 2015-08-09 NOTE — Op Note (Signed)
08/09/2015  12:34 PM  PATIENT:  David [name]  80 y.o. male  PRE-OPERATIVE DIAGNOSIS:  Spinal stenosis of lumbar region L 45 and L 5 S 1 with bilateral facet joint cysts L 5 S 1 level with neurogenic claudication and bilateral lower extremity radiculopathy  POST-OPERATIVE DIAGNOSIS:  Spinal stenosis of lumbar region L 45 and L 5 S 1 with bilateral facet joint cysts L 5 S 1 level with neurogenic claudication and bilateral lower extremity radiculopathy   PROCEDURE:  Procedure(s) with comments: Lumbar Four-Five, Lumbar Five- Sacral One Decompressive Lumbar Laminectomy with Resection of Bilateral Synovial Cysts (N/A) - L4 to S1 Decompressive Lumbar Laminectomy  SURGEON:  Surgeon(s) and Role:    * Erline Levine, MD - Primary    * Newman Pies, MD - Assisting  PHYSICIAN ASSISTANT:   ASSISTANTS: Poteat, RN   ANESTHESIA:   general  EBL:  Total I/O In: 2000 [I.V.:2000] Out: 100 [Blood:100]  BLOOD ADMINISTERED:none  DRAINS: none   LOCAL MEDICATIONS USED:  LIDOCAINE   SPECIMEN:  No Specimen  DISPOSITION OF SPECIMEN:  N/A  COUNTS:  YES  TOURNIQUET:  * No tourniquets in log *  DICTATION: DICTATION: Patient has severe spinal stenosis at L4-5 with significant leg weakness and intractable pain with neurogenic claudication.  He has bilateral synovial cysts at the L 5 S 1 level.   It was elected to take him to surgery for L 4 through S 1 laminectomy with resection of bilateral L 5 S 1 synovial cysts.  Procedure: Patient was brought to the operating room and following the smooth and uncomplicated induction of general endotracheal anesthesia he was placed in a prone position on the Wilson frame. Low back was prepped and draped in the usual sterile fashion with betadine scrub and DuraPrep. Area of planned incision was infiltrated with local lidocaine. Incision was made in the midline and carried to the lumbodorsal fascia which was incised on the bilaterallye. Subperiosteal dissection was  performed exposing what was felt to be L45 and L 5 S 1 levels. Intraoperative x-ray demonstrated marker probes at L 45 and L 5 S 1.A total laminectomy of L4 and L5 was performed with leksell, then a high-speed drill and completed with Kerrison rongeurs and generous foraminotomies were performed to decompress the L 3, L 4, L5, and S1 nerves bilaterally. Ligamentum flavum was detached and removed in a piecemeal fashion. Angled curettes were used to detatch and then remove thickened compressive ligamentous material.  Bilateral L 5 S 1 synovial cysts were identified and cleared from the dura and then removed.  At this point it was felt that all neural elements were well decompressed. The wound was then irrigated with saline. The lumbodorsal fascia was closed with 0 Vicryl sutures the subcutaneous tissues reapproximated 2-0 Vicryl inverted sutures and the skin edges were reapproximated with 3-0 Vicryl subcuticular stitch. The wound is dressed with Dermabond and an occlusive dressing. Patient was extubated in the operating room and taken to recovery in stable and satisfactory condition having tolerated his operation well counts were correct at the end of the case.    PLAN OF CARE: Admit to inpatient   PATIENT DISPOSITION:  PACU - hemodynamically stable.   Delay start of Pharmacological VTE agent (>24hrs) due to surgical blood loss or risk of bleeding: yes

## 2015-08-09 NOTE — Progress Notes (Signed)
Awake, alert, conversant.  MAEW with good power bilateral PF/DF/EHL.  Doing well.

## 2015-08-09 NOTE — Transfer of Care (Signed)
Immediate Anesthesia Transfer of Care Note  Patient: David [name]  Procedure(s) Performed: Procedure(s) with comments: Lumbar Four-Five, Lumbar Five- Sacral One Decompressive Lumbar Laminectomy with Resection of Synovial Cyst (N/A) - L4 to S1 Decompressive Lumbar Laminectomy  Patient Location: PACU  Anesthesia Type:General  Level of Consciousness: awake and alert   Airway & Oxygen Therapy: Patient Spontanous Breathing and Patient connected to nasal cannula oxygen  Post-op Assessment: Report given to RN, Post -op Vital signs reviewed and stable and Patient moving all extremities  Post vital signs: Reviewed and stable  Last Vitals:  Filed Vitals:   08/09/15 1422 08/09/15 1440  BP: 104/55 121/56  Pulse: 48 51  Temp:  36.6 C  Resp: 10 16    Complications: No apparent anesthesia complications

## 2015-08-09 NOTE — Brief Op Note (Signed)
08/09/2015  12:34 PM  PATIENT:  David [name]  80 y.o. male  PRE-OPERATIVE DIAGNOSIS:  Spinal stenosis of lumbar region L 45 and L 5 S 1 with bilateral facet joint cysts L 5 S 1 level with neurogenic claudication and bilateral lower extremity radiculopathy  POST-OPERATIVE DIAGNOSIS:  Spinal stenosis of lumbar region L 45 and L 5 S 1 with bilateral facet joint cysts L 5 S 1 level with neurogenic claudication and bilateral lower extremity radiculopathy   PROCEDURE:  Procedure(s) with comments: Lumbar Four-Five, Lumbar Five- Sacral One Decompressive Lumbar Laminectomy with Resection of Bilateral Synovial Cysts (N/A) - L4 to S1 Decompressive Lumbar Laminectomy  SURGEON:  Surgeon(s) and Role:    * Erline Levine, MD - Primary    * Newman Pies, MD - Assisting  PHYSICIAN ASSISTANT:   ASSISTANTS: Poteat, RN   ANESTHESIA:   general  EBL:  Total I/O In: 2000 [I.V.:2000] Out: 100 [Blood:100]  BLOOD ADMINISTERED:none  DRAINS: none   LOCAL MEDICATIONS USED:  LIDOCAINE   SPECIMEN:  No Specimen  DISPOSITION OF SPECIMEN:  N/A  COUNTS:  YES  TOURNIQUET:  * No tourniquets in log *  DICTATION: DICTATION: Patient has severe spinal stenosis at L4-5 with significant leg weakness and intractable pain with neurogenic claudication.  He has bilateral synovial cysts at the L 5 S 1 level.   It was elected to take him to surgery for L 4 through S 1 laminectomy with resection of bilateral L 5 S 1 synovial cysts.  Procedure: Patient was brought to the operating room and following the smooth and uncomplicated induction of general endotracheal anesthesia he was placed in a prone position on the Wilson frame. Low back was prepped and draped in the usual sterile fashion with betadine scrub and DuraPrep. Area of planned incision was infiltrated with local lidocaine. Incision was made in the midline and carried to the lumbodorsal fascia which was incised on the bilaterallye. Subperiosteal dissection was  performed exposing what was felt to be L45 and L 5 S 1 levels. Intraoperative x-ray demonstrated marker probes at L 45 and L 5 S 1.A total laminectomy of L4 and L5 was performed with leksell, then a high-speed drill and completed with Kerrison rongeurs and generous foraminotomies were performed to decompress the L 3, L 4, L5, and S1 nerves bilaterally. Ligamentum flavum was detached and removed in a piecemeal fashion. Angled curettes were used to detatch and then remove thickened compressive ligamentous material.  Bilateral L 5 S 1 synovial cysts were identified and cleared from the dura and then removed.  At this point it was felt that all neural elements were well decompressed. The wound was then irrigated with saline. The lumbodorsal fascia was closed with 0 Vicryl sutures the subcutaneous tissues reapproximated 2-0 Vicryl inverted sutures and the skin edges were reapproximated with 3-0 Vicryl subcuticular stitch. The wound is dressed with Dermabond and an occlusive dressing. Patient was extubated in the operating room and taken to recovery in stable and satisfactory condition having tolerated his operation well counts were correct at the end of the case.    PLAN OF CARE: Admit to inpatient   PATIENT DISPOSITION:  PACU - hemodynamically stable.   Delay start of Pharmacological VTE agent (>24hrs) due to surgical blood loss or risk of bleeding: yes

## 2015-08-09 NOTE — Progress Notes (Signed)
Utilization review completed.  

## 2015-08-10 ENCOUNTER — Encounter (HOSPITAL_COMMUNITY): Payer: Self-pay | Admitting: Neurosurgery

## 2015-08-10 LAB — GLUCOSE, CAPILLARY: GLUCOSE-CAPILLARY: 141 mg/dL — AB (ref 65–99)

## 2015-08-10 NOTE — Anesthesia Postprocedure Evaluation (Signed)
Anesthesia Post Note  Patient: David [name]  Procedure(s) Performed: Procedure(s) (LRB): Lumbar Four-Five, Lumbar Five- Sacral One Decompressive Lumbar Laminectomy with Resection of Synovial Cyst (N/A)  Patient location during evaluation: PACU Anesthesia Type: General Level of consciousness: sedated Pain management: satisfactory to patient Vital Signs Assessment: post-procedure vital signs reviewed and stable Respiratory status: spontaneous breathing Cardiovascular status: stable Anesthetic complications: no    Last Vitals:  Filed Vitals:   08/09/15 2327 08/10/15 0405  BP: 105/55 94/42  Pulse: 73 65  Temp: 37.2 C 36.9 C  Resp: 18 18    Last Pain:  Filed Vitals:   08/10/15 0525  PainSc: 2                  Darlisha Kelm EDWARD

## 2015-08-10 NOTE — Progress Notes (Signed)
Subjective: Patient reports "I have some pain just across my low back...none in the legs"  Objective: Vital signs in last 24 hours: Temp:  [97.5 F (36.4 C)-98.9 F (37.2 C)] 98.5 F (36.9 C) (01/20 0405) Pulse Rate:  [48-73] 65 (01/20 0405) Resp:  [9-20] 18 (01/20 0405) BP: (94-124)/(42-74) 94/42 mmHg (01/20 0405) SpO2:  [94 %-100 %] 96 % (01/20 0405) FiO2 (%):  [2 %] 2 % (01/19 1632) Weight:  [85.73 kg (189 lb)] 85.73 kg (189 lb) (01/19 0814)  Intake/Output from previous day: 01/19 0701 - 01/20 0700 In: 2340 [P.O.:240; I.V.:2100] Out: 775 [Urine:675; Blood:100] Intake/Output this shift:    Alert, conversant, sitting on edge of bed. Incision without erythema, swelling, or drainage beneath honeycomb and Dermabond. Good strength BLE. No leg pain, numbness or tingling. Reports only incisional pain (mild).  Lab Results: No results for input(s): WBC, HGB, HCT, PLT in the last 72 hours. BMET No results for input(s): NA, K, CL, CO2, GLUCOSE, BUN, CREATININE, CALCIUM in the last 72 hours.  Studies/Results: Dg Lumbar Spine 1 View  08/09/2015  CLINICAL DATA:  L4-5, L5-S1 decompressive laminectomy with resection of synovial cyst. EXAM: LUMBAR SPINE - 1 VIEW COMPARISON:  Lumbar spine MRI 06/09/2015 FINDINGS: Spinal numbering as previously established by MRI. Retractor is present at the L5 level posteriorly with overlapping probes. Spinal alignment is stable from preoperative imaging. IMPRESSION: Surgical retractor at the L5 level posteriorly. Electronically Signed   By: Monte Fantasia M.D.   On: 08/09/2015 13:01    Assessment/Plan: Improved   LOS: 1 day  Per DrStern, d/c IV, d/c to home. Rx's Percocet & Robaxin to chart for home use. Pt verbalizes understandiing of d/c instructions and agrees to call office for 3-4 week f/u appt.    Verdis Prime 08/10/2015, 7:47 AM

## 2015-08-10 NOTE — Progress Notes (Signed)
Patient alert and oriented, mae's well, voiding adequate amount of urine, swallowing without difficulty, c/o mild pain. Patient discharged home with family. Script and discharged instructions given to patient. Patient and family stated understanding of d/c instructions given and has an appointment with MD.   

## 2015-08-10 NOTE — Evaluation (Signed)
Occupational Therapy Evaluation Patient Details Name: RALPHAEL TRAME MRN: [id number] DOB: 05-08-1936 Today's Date: 08/10/2015    History of Present Illness Pt is a 80 y.o. male s/p Lumbar Four-Five, Lumbar Five- Sacral One Decompressive Lumbar Laminectomy with Resection of Bilateral Synovial Cysts (N/A) - L4 to S1 Decompressive Lumbar Laminectomy.    Clinical Impression   Pt reports he was independent with ADLs PTA. Currently pt is overall distant supervision for safety with ADLs and functional mobility. Suggested use of reacher for items that may drop on the floor; pt verbalized understanding. Educated on back precautions, maintaining precautions during functional activities, and bed mobility techniques; pt able to return demo and state 3/3 precautions at end of session. Pt planning to d/c home with 24/7 supervision from his wife. Pt ready to d/c from OT standpoint; signing off at this time. Thank you for this referral.     Follow Up Recommendations  No OT follow up;Supervision - Intermittent    Equipment Recommendations  Other (comment) (reacher)    Recommendations for Other Services PT consult     Precautions / Restrictions Precautions Precautions: Back;Fall Precaution Booklet Issued: Yes (comment) Precaution Comments: Reviewed precautions with pt. Able to state 3/3 precautions at end of session. Restrictions Weight Bearing Restrictions: No      Mobility Bed Mobility Overal bed mobility: Needs Assistance Bed Mobility: Rolling;Sidelying to Sit;Sit to Sidelying Rolling: Supervision Sidelying to sit: Supervision     Sit to sidelying: Supervision General bed mobility comments: Supervision for safety with VCs throughout for technique. Use of rails and HOB flat throughout.  Transfers Overall transfer level: Needs assistance Equipment used: None Transfers: Sit to/from Stand Sit to Stand: Supervision         General transfer comment: Distant supervision for safety. Pt  maintaining back precautions throughout.    Balance Overall balance assessment: No apparent balance deficits (not formally assessed)                                          ADL Overall ADL's : Needs assistance/impaired Eating/Feeding: Set up;Sitting   Grooming: Standing;Supervision/safety   Upper Body Bathing: Set up;Sitting   Lower Body Bathing: Supervison/ safety;Sit to/from stand   Upper Body Dressing : Set up;Sitting   Lower Body Dressing: Supervision/safety;Sit to/from stand Lower Body Dressing Details (indicate cue type and reason): Pt able to cross one foot over opposite knee. Educated on compensatory strategies for LB ADLs; pt able to demo understanding Toilet Transfer: Supervision/safety;Ambulation;Regular Toilet   Toileting- Water quality scientist and Hygiene: Supervision/safety;Sit to/from stand Toileting - Clothing Manipulation Details (indicate cue type and reason): Educated on maintaining back precautions during toilet hygiene. Tub/ Shower Transfer: Supervision/safety;Ambulation;Shower seat   Functional mobility during ADLs: Supervision/safety General ADL Comments: No family present for OT eval. Pt overall supervision for safety, demonstrating good body mechanics and maintaining back precautions throughout activities. Pt reports he dropped sock on the floor this AM and had difficulty picking it up; suggested use of reacher for items that drop on the floor; pt verbalized understanding.     Vision     Perception     Praxis      Pertinent Vitals/Pain Pain Assessment: Faces Faces Pain Scale: Hurts little more Pain Location: back, surgical site Pain Descriptors / Indicators: Sore Pain Intervention(s): Limited activity within patient's tolerance;Monitored during session;Repositioned;Patient requesting pain meds-RN notified     Hand Dominance  Extremity/Trunk Assessment Upper Extremity Assessment Upper Extremity Assessment: Overall WFL for  tasks assessed   Lower Extremity Assessment Lower Extremity Assessment: Defer to PT evaluation   Cervical / Trunk Assessment Cervical / Trunk Assessment: Other exceptions Cervical / Trunk Exceptions: s/p back sx   Communication Communication Communication: No difficulties   Cognition Arousal/Alertness: Awake/alert Behavior During Therapy: WFL for tasks assessed/performed Overall Cognitive Status: Within Functional Limits for tasks assessed                     General Comments       Exercises       Shoulder Instructions      Home Living Family/patient expects to be discharged to:: Private residence Living Arrangements: Spouse/significant other Available Help at Discharge: Family;Available 24 hours/day Type of Home: House       Home Layout: Multi-level (2 steps to get down to bedroom/bathroom)     Bathroom Shower/Tub: Occupational psychologist: Standard     Home Equipment: Shower seat - built in;Hand held shower head          Prior Functioning/Environment Level of Independence: Independent             OT Diagnosis: Acute pain   OT Problem List:     OT Treatment/Interventions:      OT Goals(Current goals can be found in the care plan section) Acute Rehab OT Goals OT Goal Formulation: All assessment and education complete, DC therapy  OT Frequency:     Barriers to D/C:            Co-evaluation              End of Session Nurse Communication: Patient requests pain meds  Activity Tolerance: Patient tolerated treatment well Patient left: in chair;with call bell/phone within reach   Time: 0752-0810 OT Time Calculation (min): 18 min Charges:  OT General Charges $OT Visit: 1 Procedure OT Evaluation $OT Eval Moderate Complexity: 1 Procedure G-Codes:     Binnie Kand M.S., OTR/L [phone]  08/10/2015, 8:22 AM

## 2015-08-10 NOTE — Discharge Summary (Signed)
Physician Discharge Summary  Patient ID: David [name] MRN: [id number] DOB/AGE: 1935/09/26 80 y.o.  Admit date: 08/09/2015 Discharge date: 08/10/2015  Admission Diagnoses:Spinal stenosis of lumbar region L 45 and L 5 S 1 with bilateral facet joint cysts L 5 S 1 level with neurogenic claudication and bilateral lower extremity radiculopathy   Discharge Diagnoses: Spinal stenosis of lumbar region L 45 and L 5 S 1 with bilateral facet joint cysts L 5 S 1 level with neurogenic claudication and bilateral lower extremity radiculopathy s/p Lumbar Four-Five, Lumbar Five- Sacral One Decompressive Lumbar Laminectomy with Resection of Bilateral Synovial Cysts (N/A) - L4 to S1 Decompressive Lumbar Laminectomy  Active Problems:   Synovial cyst of lumbar facet joint   Discharged Condition: good  Hospital Course: David [name] was admitted for surgery with dx stenosis and radiculopathy. Following uncomplicated [id number] decompressive lumbar laminectomy, he recovered nicely and transferred to [facility] for observation. He is mobilizing well.   Consults: None  Significant Diagnostic Studies: radiology: X-Ray: intra-op  Treatments: surgery: Lumbar Four-Five, Lumbar Five- Sacral One Decompressive Lumbar Laminectomy with Resection of Bilateral Synovial Cysts (N/A) - L4 to S1 Decompressive Lumbar Laminectomy   Discharge Exam: Blood pressure 94/42, pulse 65, temperature 98.5 F (36.9 C), temperature source Oral, resp. rate 18, height 5' 10.5" (1.791 m), weight 85.73 kg (189 lb), SpO2 96 %. Alert, conversant, sitting on edge of bed. Incision without erythema, swelling, or drainage beneath honeycomb and Dermabond. Good strength BLE. No leg pain, numbness or tingling. Reports only incisional pain (mild).   Disposition: 01-Home or Self Care  Rx's Percocet & Robaxin to chart for home use. Pt verbalizes understandiing of d/c instructions and agrees to call office for 3-4 week f/u appt.       Medication List     STOP taking these medications        clopidogrel 75 MG tablet  Commonly known as:  PLAVIX      TAKE these medications        aspirin EC 81 MG tablet  Take 1 tablet (81 mg total) by mouth every other day.     baclofen 10 MG tablet  Commonly known as:  LIORESAL  Take 1 tablet (10 mg total) by mouth daily.     cholecalciferol 1000 units tablet  Commonly known as:  VITAMIN D  Take 2,000 Units by mouth daily.     CINNAMON PO  Take 1,000 mg by mouth daily.     doxazosin 4 MG tablet  Commonly known as:  CARDURA  TAKE 1 TABLET BY MOUTH AT BEDTIME     finasteride 5 MG tablet  Commonly known as:  PROSCAR  Take 5 mg by mouth daily.     lisinopril 5 MG tablet  Commonly known as:  PRINIVIL,ZESTRIL  TAKE 1 TABLET BY MOUTH DAILY.     Magnesium 500 MG Caps  Take 1 capsule by mouth daily.     metFORMIN 500 MG 24 hr tablet  Commonly known as:  GLUCOPHAGE-XR  TAKE 2 TABLETS BY MOUTH TWICE DAILY     ZETIA 10 MG tablet  Generic drug:  ezetimibe  TAKE 1 TABLET BY MOUTH EVERY DAY           Follow-up Information    Follow up with Peggyann Shoals, MD.   Specialty:  Neurosurgery   Contact information:   [address] [place] [address] [place] [address] [phone]       Signed: Verdis Prime 08/10/2015, 7:55 AM

## 2015-08-10 NOTE — Evaluation (Signed)
Physical Therapy Evaluation and Discharge Patient Details Name: David [name] MRN: [id number] DOB: 1935-12-12 Today's Date: 08/10/2015   History of Present Illness  Pt is a 80 y.o. male s/p Lumbar Four-Five, Lumbar Five- Sacral One Decompressive Lumbar Laminectomy with Resection of Bilateral Synovial Cysts (N/A) - L4 to S1 Decompressive Lumbar Laminectomy.   Clinical Impression  Patient evaluated by Physical Therapy with no further acute PT needs identified. All education has been completed and the patient has no further questions. At the time of PT eval pt was able to perform transfers and ambulation with modified independence. Occasional cues required for pt to maintain back precautions, however pt could verbally state without reminders. See below for any follow-up Physical Therapy or equipment needs. PT is signing off. Thank you for this referral.     Follow Up Recommendations Outpatient PT;Supervision for mobility/OOB    Equipment Recommendations  None recommended by PT    Recommendations for Other Services       Precautions / Restrictions Precautions Precautions: Back;Fall Precaution Booklet Issued: Yes (comment) Precaution Comments: Reviewed precautions with pt. Able to state 3/3 precautions at end of session. Restrictions Weight Bearing Restrictions: No      Mobility  Bed Mobility Overal bed mobility: Needs Assistance Bed Mobility: Rolling;Sidelying to Sit;Sit to Sidelying Rolling: Supervision Sidelying to sit: Supervision     Sit to sidelying: Supervision General bed mobility comments: Pt received walking in the hall   Transfers Overall transfer level: Needs assistance Equipment used: None Transfers: Sit to/from Stand Sit to Stand: Modified independent (Device/Increase time)         General transfer comment: No assist required. No LOB noted.   Ambulation/Gait Ambulation/Gait assistance: Modified independent (Device/Increase time) Ambulation Distance  (Feet): 150 Feet Assistive device: Straight cane Gait Pattern/deviations: Step-through pattern;Decreased stride length Gait velocity: Decreased Gait velocity interpretation: Below normal speed for age/gender General Gait Details: Slow but steady. Pt sequences well with the [facility] and recommended same sequencing with the walking stick at home.   Stairs            Wheelchair Mobility    Modified Rankin (Stroke Patients Only)       Balance Overall balance assessment: No apparent balance deficits (not formally assessed)                                           Pertinent Vitals/Pain Pain Assessment: Faces Faces Pain Scale: Hurts little more Pain Location: incision site Pain Descriptors / Indicators: Operative site guarding Pain Intervention(s): Limited activity within patient's tolerance;Monitored during session;Repositioned    Home Living Family/patient expects to be discharged to:: Private residence Living Arrangements: Spouse/significant other Available Help at Discharge: Family;Available 24 hours/day Type of Home: House Home Access: Stairs to enter Entrance Stairs-Rails: Left Entrance Stairs-Number of Steps: 2 Home Layout: Multi-level (2 steps to get down to bedroom/bathroom) Home Equipment: Shower seat - built in;Hand held shower head;Other (comment) (Walking sticks - tall)      Prior Function Level of Independence: Independent               Hand Dominance        Extremity/Trunk Assessment   Upper Extremity Assessment: Overall WFL for tasks assessed           Lower Extremity Assessment: Generalized weakness      Cervical / Trunk Assessment: Other exceptions  Communication   Communication:  No difficulties  Cognition Arousal/Alertness: Awake/alert Behavior During Therapy: WFL for tasks assessed/performed Overall Cognitive Status: Within Functional Limits for tasks assessed                      General Comments       Exercises        Assessment/Plan    PT Assessment Patent does not need any further PT services  PT Diagnosis Difficulty walking;Acute pain   PT Problem List    PT Treatment Interventions     PT Goals (Current goals can be found in the Care Plan section) Acute Rehab PT Goals PT Goal Formulation: All assessment and education complete, DC therapy    Frequency     Barriers to discharge        Co-evaluation               End of Session   Activity Tolerance: Patient tolerated treatment well Patient left: in bed;with call bell/phone within reach;with family/visitor present (Sitting EOB) Nurse Communication: Mobility status         Time: [id number] PT Time Calculation (min) (ACUTE ONLY): 10 min   Charges:   PT Evaluation $PT Eval Moderate Complexity: 1 Procedure     PT G CodesRolinda Roan 09/07/15, 9:48 AM   Rolinda Roan, PT, DPT Acute Rehabilitation Services Pager: [phone]

## 2015-08-15 ENCOUNTER — Encounter: Payer: Self-pay | Admitting: Internal Medicine

## 2015-08-28 ENCOUNTER — Ambulatory Visit (INDEPENDENT_AMBULATORY_CARE_PROVIDER_SITE_OTHER): Payer: Medicare Other | Admitting: Internal Medicine

## 2015-08-28 ENCOUNTER — Encounter: Payer: Self-pay | Admitting: Internal Medicine

## 2015-08-28 VITALS — BP 110/60 | HR 66 | Temp 97.0°F | Resp 16 | Ht 70.0 in | Wt 184.0 lb

## 2015-08-28 DIAGNOSIS — I10 Essential (primary) hypertension: Secondary | ICD-10-CM

## 2015-08-28 DIAGNOSIS — Z1389 Encounter for screening for other disorder: Secondary | ICD-10-CM

## 2015-08-28 DIAGNOSIS — Z125 Encounter for screening for malignant neoplasm of prostate: Secondary | ICD-10-CM

## 2015-08-28 DIAGNOSIS — I2510 Atherosclerotic heart disease of native coronary artery without angina pectoris: Secondary | ICD-10-CM

## 2015-08-28 DIAGNOSIS — E559 Vitamin D deficiency, unspecified: Secondary | ICD-10-CM

## 2015-08-28 DIAGNOSIS — F17200 Nicotine dependence, unspecified, uncomplicated: Secondary | ICD-10-CM | POA: Diagnosis not present

## 2015-08-28 DIAGNOSIS — E785 Hyperlipidemia, unspecified: Secondary | ICD-10-CM

## 2015-08-28 DIAGNOSIS — Z79899 Other long term (current) drug therapy: Secondary | ICD-10-CM

## 2015-08-28 DIAGNOSIS — E1121 Type 2 diabetes mellitus with diabetic nephropathy: Secondary | ICD-10-CM

## 2015-08-28 DIAGNOSIS — N320 Bladder-neck obstruction: Secondary | ICD-10-CM | POA: Diagnosis not present

## 2015-08-28 DIAGNOSIS — E1129 Type 2 diabetes mellitus with other diabetic kidney complication: Secondary | ICD-10-CM | POA: Diagnosis not present

## 2015-08-28 DIAGNOSIS — N400 Enlarged prostate without lower urinary tract symptoms: Secondary | ICD-10-CM | POA: Diagnosis not present

## 2015-08-28 DIAGNOSIS — R918 Other nonspecific abnormal finding of lung field: Secondary | ICD-10-CM

## 2015-08-28 DIAGNOSIS — Z1212 Encounter for screening for malignant neoplasm of rectum: Secondary | ICD-10-CM

## 2015-08-28 DIAGNOSIS — Z1331 Encounter for screening for depression: Secondary | ICD-10-CM

## 2015-08-28 DIAGNOSIS — Z9861 Coronary angioplasty status: Secondary | ICD-10-CM

## 2015-08-28 NOTE — Progress Notes (Signed)
Patient ID: David [name], male   DOB: [birth year], 80 y.o.   MRN: [id number]  Comprehensive Evaluation & Examination  This very nice 80 y.o.male presents for presents for a comprehensive evaluation and management of multiple medical co-morbidities.  Patient has been followed for HTN, T2_NIDDM  With CKD2 , Hyperlipidemia and Vitamin D Deficiency. Patient has hx/o elevated PSA & is followed annually by Dr Gaynelle Arabian. Patient has had 2 CXR's  now predating back to Dec 9th and Jan 6th  w/o complete clearing of infiltrates in the left lung and has significant hx/o 60+ yrs of heavy tobacco use (pipe).    HTN predates since 2000 and in 2010 he had a DES to the LAD and then in 2011 he underwent PTCA for a NSTMI and has done well since. He is followed by Dr Ellyn Hack.   Patient's BP has been controlled at home.Today's BP: 110/60 mmHg. Patient  Is physically active  - chopping wood , etc and denies any cardiac symptoms as chest pain, palpitations, shortness of breath, dizziness or ankle swelling.   Patient is statin intolerant and his hyperlipidemia is improved with diet and Zetia. Patient denies myalgias or other medication SE's. Last lipids were near goal with  Cholesterol 181; HDL 49; LDL 108; Triglycerides 119 on 05/23/2015.   Patient has T2_NIDDM since 2006 and patient denies reactive hypoglycemic symptoms, visual blurring, diabetic polys or paresthesias. He reports FBG's range 100-115 mg%. Current  A1c is 6.2%.    Finally, patient has history of Vitamin D Deficiency of "22" in 2008 and last vitamin D was 57 on 02/20/2015.    Medication Sig  . aspirin EC 81 MG Take 1 tablet (81 mg total) by mouth every other day. (Patient taking differently: Take 81 mg by mouth daily. )  . VITAMIN D 1000 UNITS  Take 2,000 Units by mouth daily.   Marland Kitchen CINNAMON  Take 1,000 mg by mouth daily.   Marland Kitchen doxazosin  4 MG  TAKE 1 TABLET BY MOUTH AT BEDTIME  . finasteride  5 MG  Take 5 mg by mouth daily.  Marland Kitchen lisinopril  5 MG  TAKE 1 TABLET  BY MOUTH DAILY.  . Magnesium 500 MG  Take 1 capsule by mouth daily.  . metFORMIN-XR 500 MG TAKE 2 TABLETS BY MOUTH TWICE DAILY  . ZETIA 10 MG  TAKE 1 TABLET BY MOUTH EVERY DAY   Allergies  Allergen Reactions  . Statins Other (See Comments)    Myalgias and cramping Both Crestor and Lipitor  . Tetanus Toxoids    Past Medical History  Diagnosis Date  . Non-ST elevation MI (NSTEMI) [facility]) June 2011    PCI to LAD  . CAD S/P percutaneous coronary angioplasty 12/2009; 08/2010    a) 6/'22 - NSTEMI: PCI to LAD: Promus Element 2.5 mm x 15 mm DES; b) Cath for Angina: Prox LAD 60-70% pre-stent with FFR 0.82, 90% RVM -- Med Rx, EF 50-55%  . Essential hypertension   . Hyperlipidemia with target LDL less than 70     Statin intolerance  . Type 2 diabetes mellitus without complications ([place])   . BPH (benign prostatic hyperplasia)   . Pneumonia     DEC 2016  [place] Maintenance  Topic Date Due  . ZOSTAVAX  05/21/2016 (Originally 02/06/1998)  . TETANUS/TDAP  06/27/2020 (Originally 07/21/2010)  . FOOT EXAM  11/16/2015  . OPHTHALMOLOGY EXAM  12/21/2015  . HEMOGLOBIN A1C  01/29/2016  . INFLUENZA VACCINE  02/19/2016  .  PNA vac Low Risk Adult  Completed   Immunization History  Administered Date(s) Administered  . Influenza, High Dose Seasonal PF 06/01/2013, 04/18/2014, 05/23/2015  . Influenza-Unspecified 05/13/2011  . Pneumococcal Conjugate-13 04/18/2014  . Pneumococcal Polysaccharide-23 07/21/2002, 05/23/2015  . Td 07/21/2000   Past Surgical History  Procedure Laterality Date  . Cardiac catheterization  12/2009    Proximal LAD stenosis followed by a significant 80-90% distal stenosis  . Coronary angioplasty  12/2009    PTCA to proximal LAD; PCI with Promus Element DES stent  2.5 mm x 15 mm  distalLAD - .for non-ST elevation MI  . Cardiac catheterization  February 2012     90% ostial RV marginal branch; 60-70% proximal LAD with widely patent distal stent. FFR 0.82; medical  therapy  . Vasectomy    . Transthoracic echocardiogram  June 2011    - (EF not reported) Moderately dilated LV; moderate hypokinesis of anteroseptal and anterior wall consistent with MI. Grade 1 diastolic dysfunction. Mild to moderately dilated LA  . Cataract extraction w/ intraocular lens  implant, bilateral    . Dental surgery    . Lumbar laminectomy/decompression microdiscectomy N/A 08/09/2015    Procedure: Lumbar Four-Five, Lumbar Five- Sacral One Decompressive Lumbar Laminectomy with Resection of Synovial Cyst;  Surgeon: Erline Levine, MD;  Location: [facility] NEURO ORS;  Service: Neurosurgery;  Laterality: N/A;  L4 to S1 Decompressive Lumbar Laminectomy   Family History  Problem Relation Age of Onset  . Cancer Father     lung  . Stroke Father   . Cancer Sister     breast  . Cancer Brother     lung  . Cancer Son     history of prostate    Social History   Social History  . Marital Status: Married    Spouse Name: N/A  . Number of Children: N/A  . Years of Education: N/A   Occupational History  . Not on file.   Social History Main Topics  . Smoking status: Current Every Day Smoker -- 60 years    Types: Pipe  . Smokeless tobacco: Never Used     Comment: smokes pipe qd  . Alcohol Use: 4.2 oz/week    7 Standard drinks or equivalent per week     Comment: drinks 4 ounces of vodka daily  . Drug Use: No  . Sexual Activity: Not on file   Other Topics Concern  . Not on file   Social History Narrative   He is a married father of 2 with no grandchildren as of yet. He continued to smoke a pipe several times the course of day. He does have an occasional alcoholic beverage. While not involved a standard exercise routine, he is very active with walking and working in the yard, splitting wood and doing aggressive yard work including Production designer, theatre/television/film.    ROS Constitutional: Denies fever, chills, weight loss/gain, headaches, insomnia,  night sweats or change in appetite.  Does c/o fatigue. Eyes: Denies redness, blurred vision, diplopia, discharge, itchy or watery eyes.  ENT: Denies discharge, congestion, post nasal drip, epistaxis, sore throat, earache, hearing loss, dental pain, Tinnitus, Vertigo, Sinus pain or snoring.  Cardio: Denies chest pain, palpitations, irregular heartbeat, syncope, dyspnea, diaphoresis, orthopnea, PND, claudication or edema Respiratory: denies cough, dyspnea, DOE, pleurisy, hoarseness, laryngitis or wheezing.  Gastrointestinal: Denies dysphagia, heartburn, reflux, water brash, pain, cramps, nausea, vomiting, bloating, diarrhea, constipation, hematemesis, melena, hematochezia, jaundice or hemorrhoids Genitourinary: Denies dysuria, frequency, urgency, nocturia, hesitancy,  discharge, hematuria or flank pain Musculoskeletal: Denies arthralgia, myalgia, stiffness, Jt. Swelling, pain, limp or strain/sprain. Denies Falls. Skin: Denies puritis, rash, hives, warts, acne, eczema or change in skin lesion Neuro: No weakness, tremor, incoordination, spasms, paresthesia or pain Psychiatric: Denies confusion, memory loss or sensory loss. Denies Depression. Endocrine: Denies change in weight, skin, hair change, nocturia, and paresthesia, diabetic polys, visual blurring or hyper / hypo glycemic episodes.  Heme/Lymph: No excessive bleeding, bruising or enlarged lymph nodes.  Physical Exam  BP 110/60 mmHg  Pulse 66  Temp(Src) 97 F (36.1 C)  Resp 16  Ht 5\' 10"  (1.778 m)  Wt 184 lb (83.462 kg)  BMI 26.40 kg/m2  SpO2 98%  General Appearance: Well nourished, in no apparent distress. Eyes: PERRLA, EOMs, conjunctiva no swelling or erythema, normal fundi and vessels. Sinuses: No frontal/maxillary tenderness ENT/Mouth: EACs patent / TMs  nl. Nares clear without erythema, swelling, mucoid exudates. Oral hygiene is good. No erythema, swelling, or exudate. Tongue normal, non-obstructing. Tonsils not swollen or erythematous. Hearing normal.  Neck: Supple,  thyroid normal. No bruits, nodes or JVD. Respiratory: Respiratory effort normal.  BS equal and clear bilateral without rales, rhonci, wheezing or stridor. Cardio: Heart sounds are normal with regular rate and rhythm and no murmurs, rubs or gallops. Peripheral pulses are normal and equal bilaterally without edema. No aortic or femoral bruits. Chest: symmetric with normal excursions and percussion.  Abdomen: Soft, with Nl bowel sounds. Nontender, no guarding, rebound, hernias, masses, or organomegaly.  Lymphatics: Non tender without lymphadenopathy.  Genitourinary: Deferred to Dr Gaynelle Arabian (annually)  Musculoskeletal: Full ROM all peripheral extremities, joint stability, 5/5 strength, and normal gait. Skin: Warm and dry without rashes, lesions, cyanosis, clubbing or  ecchymosis.  Neuro: Cranial nerves intact, DTR's Nl/equal bilaterally - upper & lower extremities. Normal muscle tone, no cerebellar symptoms. Sensation intact to touch, Vibratory and Monofilament testing to the toes bilaterally.  Pysch: Alert and oriented X 3 with normal affect, insight and judgment appropriate.   Assessment and Plan  1. Essential hypertension  - Microalbumin / creatinine urine ratio - [place], RETROPERITNL ABD,  LTD - TSH  2. Hyperlipidemia with statin intolerance  - Lipid panel - TSH  3. Type II diabetes mellitus with nephropathy (HCC)  - Insulin, random - [facility] DIABETES FOOT EXAM - LOW EXTREMITY NEUR EXAM DOCUM  4. Vitamin D deficiency  - VITAMIN D 25 Hydroxy   5. CAD s/p PTCA & PCI- DES mid LAD in the setting of non-STEMI   6. Tobacco dependency   7. BPH    8. Screening for rectal cancer  - POC Hemoccult Bld/Stl   9. Prostate cancer screening  - PSA  10. Depression screen   11. Medication management  - Urinalysis, Routine w reflex microscopic  - Magnesium  12. Bladder neck obstruction  - PSA  13. Abnormal x-ray of lung, r/o occult Ca  - CT Chest Wo Contrast;  Future   Continue prudent diet as discussed, weight control, BP monitoring, regular exercise, and medications as discussed.  Discussed med effects and SE's. Routine screening labs and tests as requested with regular follow-up as recommended. Over 40 minutes of exam, counseling, chart review and high complex critical decision making was performed

## 2015-08-28 NOTE — Patient Instructions (Signed)

## 2015-08-29 ENCOUNTER — Ambulatory Visit
Admission: RE | Admit: 2015-08-29 | Discharge: 2015-08-29 | Disposition: A | Discharge status: Home / Self Care | Payer: Medicare Other | Source: Ambulatory Visit | Attending: Internal Medicine | Admitting: Internal Medicine

## 2015-08-29 DIAGNOSIS — R918 Other nonspecific abnormal finding of lung field: Secondary | ICD-10-CM

## 2015-08-29 DIAGNOSIS — J189 Pneumonia, unspecified organism: Secondary | ICD-10-CM | POA: Diagnosis not present

## 2015-08-29 LAB — PSA: PSA: 1.54 ng/mL (ref <=4.00)

## 2015-08-29 LAB — INSULIN, RANDOM: Insulin: 4.6 u[IU]/mL (ref 2.0–19.6)

## 2015-08-29 LAB — URINALYSIS, ROUTINE W REFLEX MICROSCOPIC
BILIRUBIN URINE: NEGATIVE
GLUCOSE, UA: NEGATIVE
HGB URINE DIPSTICK: NEGATIVE
KETONES UR: NEGATIVE
Leukocytes, UA: NEGATIVE
Nitrite: NEGATIVE
PH: 6 (ref 5.0–8.0)
Protein, ur: NEGATIVE
SPECIFIC GRAVITY, URINE: 1.017 (ref 1.001–1.035)

## 2015-08-29 LAB — LIPID PANEL
CHOL/HDL RATIO: 3.8 ratio (ref <=5.0)
Cholesterol: 177 mg/dL (ref 125–200)
HDL: 46 mg/dL (ref >=40)
LDL CALC: 109 mg/dL (ref <130)
Triglycerides: 110 mg/dL (ref <150)
VLDL: 22 mg/dL (ref <30)

## 2015-08-29 LAB — TSH: TSH: 2.01 m[IU]/L (ref 0.40–4.50)

## 2015-08-29 LAB — MICROALBUMIN / CREATININE URINE RATIO
Creatinine, Urine: 88 mg/dL (ref 20–370)
MICROALB UR: 0.3 mg/dL
Microalb Creat Ratio: 3 mcg/mg creat (ref <30)

## 2015-08-29 LAB — VITAMIN D 25 HYDROXY (VIT D DEFICIENCY, FRACTURES): Vit D, 25-Hydroxy: 68 ng/mL (ref 30–100)

## 2015-08-29 LAB — MAGNESIUM: MAGNESIUM: 2 mg/dL (ref 1.5–2.5)

## 2015-09-03 ENCOUNTER — Other Ambulatory Visit: Payer: Self-pay | Admitting: Internal Medicine

## 2015-09-17 ENCOUNTER — Other Ambulatory Visit: Payer: Self-pay | Admitting: *Deleted

## 2015-09-17 DIAGNOSIS — Z1212 Encounter for screening for malignant neoplasm of rectum: Secondary | ICD-10-CM

## 2015-09-17 LAB — POC HEMOCCULT BLD/STL (HOME/3-CARD/SCREEN)
Card #2 Fecal Occult Blod, POC: NEGATIVE
FECAL OCCULT BLD: NEGATIVE
FECAL OCCULT BLD: NEGATIVE

## 2015-10-24 DIAGNOSIS — H01003 Unspecified blepharitis right eye, unspecified eyelid: Secondary | ICD-10-CM | POA: Diagnosis not present

## 2015-10-24 DIAGNOSIS — H35362 Drusen (degenerative) of macula, left eye: Secondary | ICD-10-CM | POA: Diagnosis not present

## 2015-10-24 DIAGNOSIS — H40013 Open angle with borderline findings, low risk, bilateral: Secondary | ICD-10-CM | POA: Diagnosis not present

## 2015-10-24 DIAGNOSIS — H02833 Dermatochalasis of right eye, unspecified eyelid: Secondary | ICD-10-CM | POA: Diagnosis not present

## 2015-10-24 DIAGNOSIS — E113292 Type 2 diabetes mellitus with mild nonproliferative diabetic retinopathy without macular edema, left eye: Secondary | ICD-10-CM | POA: Diagnosis not present

## 2015-11-02 ENCOUNTER — Other Ambulatory Visit: Payer: Self-pay | Admitting: Internal Medicine

## 2015-11-12 DIAGNOSIS — M545 Low back pain: Secondary | ICD-10-CM | POA: Diagnosis not present

## 2015-11-12 DIAGNOSIS — M5416 Radiculopathy, lumbar region: Secondary | ICD-10-CM | POA: Diagnosis not present

## 2015-11-12 DIAGNOSIS — Z6827 Body mass index (BMI) 27.0-27.9, adult: Secondary | ICD-10-CM | POA: Diagnosis not present

## 2015-11-12 DIAGNOSIS — M7130 Other bursal cyst, unspecified site: Secondary | ICD-10-CM | POA: Diagnosis not present

## 2015-11-12 DIAGNOSIS — M4806 Spinal stenosis, lumbar region: Secondary | ICD-10-CM | POA: Diagnosis not present

## 2015-12-04 ENCOUNTER — Ambulatory Visit (INDEPENDENT_AMBULATORY_CARE_PROVIDER_SITE_OTHER): Payer: Medicare Other | Admitting: Internal Medicine

## 2015-12-04 ENCOUNTER — Other Ambulatory Visit: Payer: Self-pay | Admitting: Internal Medicine

## 2015-12-04 ENCOUNTER — Encounter: Payer: Self-pay | Admitting: Internal Medicine

## 2015-12-04 VITALS — BP 128/64 | HR 68 | Temp 98.2°F | Resp 16 | Ht 71.0 in | Wt 188.0 lb

## 2015-12-04 DIAGNOSIS — F17200 Nicotine dependence, unspecified, uncomplicated: Secondary | ICD-10-CM | POA: Diagnosis not present

## 2015-12-04 DIAGNOSIS — N400 Enlarged prostate without lower urinary tract symptoms: Secondary | ICD-10-CM | POA: Diagnosis not present

## 2015-12-04 DIAGNOSIS — Z9861 Coronary angioplasty status: Secondary | ICD-10-CM

## 2015-12-04 DIAGNOSIS — Z79899 Other long term (current) drug therapy: Secondary | ICD-10-CM | POA: Diagnosis not present

## 2015-12-04 DIAGNOSIS — E1129 Type 2 diabetes mellitus with other diabetic kidney complication: Secondary | ICD-10-CM | POA: Diagnosis not present

## 2015-12-04 DIAGNOSIS — I10 Essential (primary) hypertension: Secondary | ICD-10-CM | POA: Diagnosis not present

## 2015-12-04 DIAGNOSIS — E559 Vitamin D deficiency, unspecified: Secondary | ICD-10-CM

## 2015-12-04 DIAGNOSIS — M7138 Other bursal cyst, other site: Secondary | ICD-10-CM

## 2015-12-04 DIAGNOSIS — E785 Hyperlipidemia, unspecified: Secondary | ICD-10-CM | POA: Diagnosis not present

## 2015-12-04 DIAGNOSIS — E1121 Type 2 diabetes mellitus with diabetic nephropathy: Secondary | ICD-10-CM

## 2015-12-04 DIAGNOSIS — I2510 Atherosclerotic heart disease of native coronary artery without angina pectoris: Secondary | ICD-10-CM

## 2015-12-04 DIAGNOSIS — R6889 Other general symptoms and signs: Secondary | ICD-10-CM | POA: Diagnosis not present

## 2015-12-04 DIAGNOSIS — Z0001 Encounter for general adult medical examination with abnormal findings: Secondary | ICD-10-CM | POA: Diagnosis not present

## 2015-12-04 DIAGNOSIS — Z0000 Encounter for general adult medical examination without abnormal findings: Secondary | ICD-10-CM

## 2015-12-04 LAB — LIPID PANEL
CHOL/HDL RATIO: 3.6 ratio (ref <=5.0)
Cholesterol: 174 mg/dL (ref 125–200)
HDL: 48 mg/dL (ref >=40)
LDL CALC: 101 mg/dL (ref <130)
Triglycerides: 127 mg/dL (ref <150)
VLDL: 25 mg/dL (ref <30)

## 2015-12-04 LAB — CBC WITH DIFFERENTIAL/PLATELET
BASOS PCT: 1 %
Basophils Absolute: 82 cells/uL (ref 0–200)
EOS ABS: 492 {cells}/uL (ref 15–500)
Eosinophils Relative: 6 %
HEMATOCRIT: 46.4 % (ref 38.5–50.0)
Hemoglobin: 15.6 g/dL (ref 13.2–17.1)
LYMPHS PCT: 16 %
Lymphs Abs: 1312 cells/uL (ref 850–3900)
MCH: 30.8 pg (ref 27.0–33.0)
MCHC: 33.6 g/dL (ref 32.0–36.0)
MCV: 91.5 fL (ref 80.0–100.0)
MONO ABS: 1066 {cells}/uL — AB (ref 200–950)
MPV: 10.4 fL (ref 7.5–12.5)
Monocytes Relative: 13 %
Neutro Abs: 5248 cells/uL (ref 1500–7800)
Neutrophils Relative %: 64 %
Platelets: 238 10*3/uL (ref 140–400)
RBC: 5.07 MIL/uL (ref 4.20–5.80)
RDW: 14 % (ref 11.0–15.0)
WBC: 8.2 10*3/uL (ref 3.8–10.8)

## 2015-12-04 LAB — HEPATIC FUNCTION PANEL
ALBUMIN: 4.1 g/dL (ref 3.6–5.1)
ALK PHOS: 70 U/L (ref 40–115)
ALT: 15 U/L (ref 9–46)
AST: 16 U/L (ref 10–35)
Bilirubin, Direct: 0.1 mg/dL (ref <=0.2)
Indirect Bilirubin: 0.5 mg/dL (ref 0.2–1.2)
TOTAL PROTEIN: 6.7 g/dL (ref 6.1–8.1)
Total Bilirubin: 0.6 mg/dL (ref 0.2–1.2)

## 2015-12-04 LAB — BASIC METABOLIC PANEL WITH GFR
BUN: 14 mg/dL (ref 7–25)
CHLORIDE: 102 mmol/L (ref 98–110)
CO2: 27 mmol/L (ref 20–31)
Calcium: 9.7 mg/dL (ref 8.6–10.3)
Creat: 0.86 mg/dL (ref 0.70–1.18)
GFR, Est African American: >89 mL/min (ref >=60)
GFR, Est Non African American: 84 mL/min (ref >=60)
GLUCOSE: 87 mg/dL (ref 65–99)
POTASSIUM: 4.4 mmol/L (ref 3.5–5.3)
Sodium: 137 mmol/L (ref 135–146)

## 2015-12-04 LAB — HEMOGLOBIN A1C
HEMOGLOBIN A1C: 6.1 % — AB (ref <5.7)
Mean Plasma Glucose: 128 mg/dL

## 2015-12-04 NOTE — Progress Notes (Signed)
MEDICARE ANNUAL WELLNESS VISIT AND FOLLOW UP Assessment:    1. CAD S/P percutaneous coronary angioplasty and PCI- DES mid LAD in the setting of non-STEMI -followed by cards -medically controlled currently  2. Essential hypertension -Well controlled currently -cont meds -DASH diet -monitor at home - TSH  3. Type II diabetes mellitus with nephropathy (HCC) -diet and exercise -cont meds - Hemoglobin A1c  4. Synovial cyst of lumbar facet joint -resolved s/p surgery for removal  5. BPH (benign prostatic hyperplasia) -cont meds -not currently symptomatic with meds on board  6. Hyperlipidemia with statin intolerance -cont meds - Lipid panel  7. Tobacco dependency -not ready to quit  8. Vitamin D deficiency -cont supplement  9. Medication management - CBC with Differential/Platelet - BASIC METABOLIC PANEL WITH GFR - Hepatic function panel     Over 30 minutes of exam, counseling, chart review, and critical decision making was performed  Plan:   During the course of the visit the patient was educated and counseled about appropriate screening and preventive services including:    Pneumococcal vaccine   Influenza vaccine  Prevnar 13  Td vaccine  Screening electrocardiogram  Colorectal cancer screening  Diabetes screening  Glaucoma screening  Nutrition counseling   Conditions/risks identified: BMI: Discussed weight loss, diet, and increase physical activity.  Increase physical activity: AHA recommends 150 minutes of physical activity a week.  Medications reviewed Diabetes is at goal, ACE/ARB therapy: Yes. Urinary Incontinence is not an issue: discussed non pharmacology and pharmacology options.  Fall risk: low- discussed PT, home fall assessment, medications.    Subjective:  David [name] is a 80 y.o. male who presents for Medicare Annual Wellness Visit and 3 month follow up for HTN, hyperlipidemia, prediabetes, and vitamin D Def.  Date of last  medicare wellness visit was is unknown.  His blood pressure has been controlled at home, today their BP is BP: 128/64 mmHg He does workout. He denies chest pain, shortness of breath, dizziness.  He is not on cholesterol medication and denies myalgias. His cholesterol is at goal. The cholesterol last visit was:   Lab Results  Component Value Date   CHOL 174 12/04/2015   HDL 48 12/04/2015   LDLCALC 101 12/04/2015   TRIG 127 12/04/2015   CHOLHDL 3.6 12/04/2015   He has been working on diet and exercise for prediabetes, and denies foot ulcerations, hyperglycemia, hypoglycemia , increased appetite, nausea, paresthesia of the feet, polydipsia, polyuria, visual disturbances, vomiting and weight loss. Last A1C in the office was:  Lab Results  Component Value Date   HGBA1C 6.1* 12/04/2015   Last GFR Lab Results  Component Value Date   GFRNONAA 84 12/04/2015     Lab Results  Component Value Date   GFRAA >89 12/04/2015   Patient is on Vitamin D supplement.   Lab Results  Component Value Date   VD25OH 68 08/28/2015     Patient reports that he has been recovering well from back surgery to have a cyst removed from his back.   His pain in his legs is completely gone.   He does note that his right second toe has been bothering him.  He reports that he has no injury to it.  No swelling or redness.  He reports that it is sensitive to the touch.    Medication Review: Current Outpatient Prescriptions on File Prior to Visit  Medication Sig Dispense Refill  . aspirin EC 81 MG tablet Take 1 tablet (81 mg total) by mouth  every other day. (Patient taking differently: Take 81 mg by mouth daily. ) 30 tablet 11  . cholecalciferol (VITAMIN D) 1000 UNITS tablet Take 2,000 Units by mouth daily.     Marland Kitchen CINNAMON PO Take 1,000 mg by mouth daily.     Marland Kitchen doxazosin (CARDURA) 4 MG tablet TAKE 1 TABLET BY MOUTH AT BEDTIME 90 tablet 1  . finasteride (PROSCAR) 5 MG tablet Take 5 mg by mouth daily.    Marland Kitchen lisinopril  (PRINIVIL,ZESTRIL) 5 MG tablet TAKE 1 TABLET BY MOUTH DAILY. 90 tablet 3  . Magnesium 500 MG CAPS Take 1 capsule by mouth daily.    Marland Kitchen ZETIA 10 MG tablet TAKE 1 TABLET BY MOUTH EVERY DAY 90 tablet 1   No current facility-administered medications on file prior to visit.    Current Problems (verified) Patient Active Problem List   Diagnosis Date Noted  . Synovial cyst of lumbar facet joint 08/09/2015  . BPH (benign prostatic hyperplasia) 11/16/2014  . Vitamin D deficiency 12/25/2013  . Medication management 12/25/2013  . Tobacco dependency 02/22/2013  . Hyperlipidemia with statin intolerance   . Essential hypertension   . Type II diabetes mellitus with nephropathy ([place])   . CAD S/P percutaneous coronary angioplasty and PCI- DES mid LAD in the setting of non-STEMI 12/31/2009    Screening Tests Immunization History  Administered Date(s) Administered  . Influenza, High Dose Seasonal PF 06/01/2013, 04/18/2014, 05/23/2015  . Influenza-Unspecified 05/13/2011  . Pneumococcal Conjugate-13 04/18/2014  . Pneumococcal Polysaccharide-23 07/21/2002, 05/23/2015  . Td 07/21/2000    Preventative care: Last colonoscopy: 2015  Prior vaccinations: TD or Tdap: 2002, allergy to TDAP/TD Influenza: 2016  Pneumococcal: 2016 Prevnar13: 2015 Shingles/Zostavax: Declined  Names of Other Physician/Practitioners you currently use: 1. [facility] here for primary care 2. Dr. Herbert Deaner, eye doctor, last visit 2016 3. Does not see one currently, dentist, last visit 2015 Patient Care Team: [name], MD as PCP - General (Internal Medicine) Laurence Spates, MD as Consulting Physician (Gastroenterology) Leonie Man, MD as Consulting Physician (Cardiology) Erline Levine, MD as Consulting Physician (Neurosurgery) Carolan Clines, MD as Consulting Physician (Urology)  Past Surgical History  Procedure Laterality Date  . Cardiac catheterization  12/2009     Proximal LAD stenosis followed by a significant 80-90% distal stenosis  . Coronary angioplasty  12/2009    PTCA to proximal LAD; PCI with Promus Element DES stent  2.5 mm x 15 mm  distalLAD - .for non-ST elevation MI  . Cardiac catheterization  February 2012     90% ostial RV marginal branch; 60-70% proximal LAD with widely patent distal stent. FFR 0.82; medical therapy  . Vasectomy    . Transthoracic echocardiogram  June 2011    - (EF not reported) Moderately dilated LV; moderate hypokinesis of anteroseptal and anterior wall consistent with MI. Grade 1 diastolic dysfunction. Mild to moderately dilated LA  . Cataract extraction w/ intraocular lens  implant, bilateral    . Dental surgery    . Lumbar laminectomy/decompression microdiscectomy N/A 08/09/2015    Procedure: Lumbar Four-Five, Lumbar Five- Sacral One Decompressive Lumbar Laminectomy with Resection of Synovial Cyst;  Surgeon: Erline Levine, MD;  Location: [facility];  Service: Neurosurgery;  Laterality: N/A;  L4 to S1 Decompressive Lumbar Laminectomy   Family History  Problem Relation Age of Onset  . Cancer Father     lung  . Stroke Father   . Cancer Sister     breast  . Cancer Brother  lung  . Cancer Son     history of prostate   Social History  Substance Use Topics  . Smoking status: Current Every Day Smoker -- 60 years    Types: Pipe  . Smokeless tobacco: Never Used     Comment: smokes pipe qd  . Alcohol Use: 4.2 oz/week    7 Standard drinks or equivalent per week     Comment: drinks 4 ounces of vodka daily   Allergies  Allergen Reactions  . Statins Other (See Comments)    Myalgias and cramping Both Crestor and Lipitor  . Tetanus Toxoids     MEDICARE WELLNESS OBJECTIVES: Tobacco use: He does smoke.  Patient is a former smoker. If yes, counseling given Alcohol Current alcohol use: 4 oz of vodka Diet: well balanced Physical activity: Current Exercise Habits: Home exercise routine, Type of exercise: walking,  Time (Minutes): 30, Frequency (Times/Week): 7, Weekly Exercise (Minutes/Week): 210, Intensity: Moderate Cardiac risk factors: Cardiac Risk Factors include: advanced age (>45men, >27 women);hypertension;male gender Depression/mood screen:   Depression screen [facility] 2/9 12/04/2015  Decreased Interest 0  Down, Depressed, Hopeless 0  PHQ - 2 Score 0    ADLs:  In your present state of health, do you have any difficulty performing the following activities: 12/04/2015 08/28/2015  Hearing? N N  Vision? N N  Difficulty concentrating or making decisions? N N  Walking or climbing stairs? N N  Dressing or bathing? N N  Doing errands, shopping? N N  Preparing Food and eating ? N -  Using the Toilet? N -  In the past six months, have you accidently leaked urine? N -  Do you have problems with loss of bowel control? N -  Managing your Medications? N -  Managing your Finances? N -  Housekeeping or managing your Housekeeping? N -     Cognitive Testing  Alert? Yes  Normal Appearance?Yes  Oriented to person? Yes  Place? Yes   Time? Yes  Recall of three objects?  Yes  Can perform simple calculations? Yes  Displays appropriate judgment?Yes  Can read the correct time from a watch face?Yes  EOL planning: Does patient have an advance directive?: Yes Type of Advance Directive: Healthcare Power of Attorney, Living will Does patient want to make changes to advanced directive?: No - Patient declined Copy of advanced directive(s) in chart?: No - copy requested   Objective:   Today's Vitals   12/04/15 0932  BP: 128/64  Pulse: 68  Temp: 98.2 F (36.8 C)  TempSrc: Temporal  Resp: 16  Height: 5\' 11"  (1.803 m)  Weight: 188 lb (85.276 kg)   Body mass index is 26.23 kg/(m^2).  General appearance: alert, no distress, WD/WN, male HEENT: normocephalic, sclerae anicteric, TMs pearly, nares patent, no discharge or erythema, pharynx normal Oral cavity: MMM, no lesions Neck: supple, no lymphadenopathy, no  thyromegaly, no masses Heart: RRR, normal S1, S2, no murmurs Lungs: CTA bilaterally, no wheezes, rhonchi, or rales Abdomen: +bs, soft, non tender, non distended, no masses, no hepatomegaly, no splenomegaly Musculoskeletal: nontender, no swelling, no obvious deformity Extremities: no edema, no cyanosis, no clubbing Pulses: 2+ symmetric, upper and lower extremities, normal cap refill Neurological: alert, oriented x 3, CN2-12 intact, strength normal upper extremities and lower extremities, sensation normal throughout, DTRs 2+ throughout, no cerebellar signs, gait normal Psychiatric: normal affect, behavior normal, pleasant  Skin:  Ulcer to the bottom of the right second toe.  Mildly tender to palpation.    Medicare Attestation I have  personally reviewed: The patient's medical and social history Their use of alcohol, tobacco or illicit drugs Their current medications and supplements The patient's functional ability including ADLs,fall risks, home safety risks, cognitive, and hearing and visual impairment Diet and physical activities Evidence for depression or mood disorders  The patient's weight, height, BMI, and visual acuity have been recorded in the chart.  I have made referrals, counseling, and provided education to the patient based on review of the above and I have provided the patient with a written personalized care plan for preventive services.     Starlyn Skeans, PA-C   12/05/2015

## 2015-12-05 LAB — TSH: TSH: 2.01 mIU/L (ref 0.40–4.50)

## 2015-12-27 DIAGNOSIS — M545 Low back pain: Secondary | ICD-10-CM | POA: Diagnosis not present

## 2016-01-01 DIAGNOSIS — M5416 Radiculopathy, lumbar region: Secondary | ICD-10-CM | POA: Diagnosis not present

## 2016-01-03 DIAGNOSIS — M545 Low back pain: Secondary | ICD-10-CM | POA: Diagnosis not present

## 2016-01-03 DIAGNOSIS — M5416 Radiculopathy, lumbar region: Secondary | ICD-10-CM | POA: Diagnosis not present

## 2016-01-09 DIAGNOSIS — M4806 Spinal stenosis, lumbar region: Secondary | ICD-10-CM | POA: Diagnosis not present

## 2016-01-09 DIAGNOSIS — M7130 Other bursal cyst, unspecified site: Secondary | ICD-10-CM | POA: Diagnosis not present

## 2016-01-09 DIAGNOSIS — M545 Low back pain: Secondary | ICD-10-CM | POA: Diagnosis not present

## 2016-01-09 DIAGNOSIS — M5416 Radiculopathy, lumbar region: Secondary | ICD-10-CM | POA: Diagnosis not present

## 2016-01-14 ENCOUNTER — Other Ambulatory Visit: Payer: Self-pay | Admitting: Neurosurgery

## 2016-01-16 ENCOUNTER — Encounter (HOSPITAL_COMMUNITY)
Admission: RE | Admit: 2016-01-16 | Discharge: 2016-01-16 | Disposition: A | Discharge status: Home / Self Care | Payer: Medicare Other | Source: Ambulatory Visit | Attending: Neurosurgery | Admitting: Neurosurgery

## 2016-01-16 ENCOUNTER — Other Ambulatory Visit (HOSPITAL_COMMUNITY): Payer: Self-pay | Admitting: *Deleted

## 2016-01-16 ENCOUNTER — Encounter (HOSPITAL_COMMUNITY): Payer: Self-pay

## 2016-01-16 DIAGNOSIS — Z01812 Encounter for preprocedural laboratory examination: Secondary | ICD-10-CM | POA: Diagnosis not present

## 2016-01-16 DIAGNOSIS — Z79899 Other long term (current) drug therapy: Secondary | ICD-10-CM | POA: Insufficient documentation

## 2016-01-16 DIAGNOSIS — M7138 Other bursal cyst, other site: Secondary | ICD-10-CM | POA: Insufficient documentation

## 2016-01-16 HISTORY — DX: Unspecified osteoarthritis, unspecified site: M19.90

## 2016-01-16 LAB — SURGICAL PCR SCREEN
MRSA, PCR: NEGATIVE
STAPHYLOCOCCUS AUREUS: NEGATIVE

## 2016-01-16 LAB — CBC
HCT: 44.7 % (ref 39.0–52.0)
Hemoglobin: 15.3 g/dL (ref 13.0–17.0)
MCH: 31.2 pg (ref 26.0–34.0)
MCHC: 34.2 g/dL (ref 30.0–36.0)
MCV: 91.2 fL (ref 78.0–100.0)
PLATELETS: 228 10*3/uL (ref 150–400)
RBC: 4.9 MIL/uL (ref 4.22–5.81)
RDW: 13.3 % (ref 11.5–15.5)
WBC: 10.3 10*3/uL (ref 4.0–10.5)

## 2016-01-16 LAB — BASIC METABOLIC PANEL
Anion gap: 6 (ref 5–15)
BUN: 11 mg/dL (ref 6–20)
CO2: 28 mmol/L (ref 22–32)
CREATININE: 0.76 mg/dL (ref 0.61–1.24)
Calcium: 9.4 mg/dL (ref 8.9–10.3)
Chloride: 100 mmol/L — ABNORMAL LOW (ref 101–111)
GFR calc Af Amer: >60 mL/min (ref >60)
Glucose, Bld: 135 mg/dL — ABNORMAL HIGH (ref 65–99)
Potassium: 4 mmol/L (ref 3.5–5.1)
SODIUM: 134 mmol/L — AB (ref 135–145)

## 2016-01-16 LAB — GLUCOSE, CAPILLARY: Glucose-Capillary: 174 mg/dL — ABNORMAL HIGH (ref 65–99)

## 2016-01-16 NOTE — Progress Notes (Signed)
Pt. States that he does not check his blood sugars at home.

## 2016-01-16 NOTE — Pre-Procedure Instructions (Signed)
    David [name]  01/16/2016      [id number] - [place], [place] - [address] [name] [address] Phone: [phone] Fax: [phone]    Your procedure is scheduled on 01-21-2016    Monday .  Report to [facility] Admitting at 5:30 A.M.   Call this number if you have problems the morning of surgery:  [phone]   Remember:  Do not eat food or drink liquids after midnight.   Take these medicines the morning of surgery with A SIP OF WATER finasteride(Proscar),Zetia             STOP ASPIRIN,ANTIINFLAMATORIES (IBUPROFEN,ALEVE,MOTRIN,ADVIL,GOODY'S POWDERS),HERBAL SUPPLEMENTS,FISH OIL,AND VITAMINS 5-7 DAYS PRIOR TO SURGERY          STOP PLAVIX PER MD INSTRUCTIONS    Do not wear jewelry  Do not wear lotions, powders, or perfumes.  You may not wear deoderant.  Do not shave 48 hours prior to surgery.  Men may shave face and neck.   Do not bring valuables to the hospital.  [facility] is not responsible for any belongings or valuables.  Contacts, dentures or bridgework may not be worn into surgery.  Leave your suitcase in the car.  After surgery it may be brought to your room.  For patients admitted to the hospital, discharge time will be determined by your treatment team.  Patients discharged the day of surgery will not be allowed to drive home.    Special instructions:  See attached Sheet for instructions on CHG shower  Please read over the following fact sheets that you were given. Surgical Site Infection Prevention

## 2016-01-18 MED ORDER — CEFAZOLIN SODIUM-DEXTROSE 2-4 GM/100ML-% IV SOLN
2.0000 g | INTRAVENOUS | Status: AC
  Administered 2016-01-21: 2 g via INTRAVENOUS
  Filled 2016-01-18: qty 100

## 2016-01-20 NOTE — Anesthesia Preprocedure Evaluation (Addendum)
Anesthesia Evaluation  Patient identified by MRN, date of birth, ID band Patient awake    Reviewed: Allergy & Precautions, H&P , NPO status , Patient's Chart, lab work & pertinent test results  Airway Mallampati: III  TM Distance: >3 FB Neck ROM: Full    Dental no notable dental hx. (+) Teeth Intact, Dental Advisory Given   Pulmonary Current Smoker,    Pulmonary exam normal breath sounds clear to auscultation       Cardiovascular hypertension, Pt. on medications + CAD   Rhythm:Regular Rate:Normal     Neuro/Psych negative neurological ROS  negative psych ROS   GI/Hepatic negative GI ROS, Neg liver ROS,   Endo/Other  diabetes, Type 2, Oral Hypoglycemic Agents  Renal/GU negative Renal ROS  negative genitourinary   Musculoskeletal  (+) Arthritis , Osteoarthritis,    Abdominal   Peds  Hematology negative hematology ROS (+)   Anesthesia Other Findings   Reproductive/Obstetrics negative OB ROS                            Anesthesia Physical Anesthesia Plan  ASA: III  Anesthesia Plan: General   Post-op Pain Management:    Induction: Intravenous  Airway Management Planned: Oral ETT  Additional Equipment:   Intra-op Plan:   Post-operative Plan: Extubation in OR  Informed Consent: I have reviewed the patients History and Physical, chart, labs and discussed the procedure including the risks, benefits and alternatives for the proposed anesthesia with the patient or authorized representative who has indicated his/her understanding and acceptance.   Dental advisory given  Plan Discussed with: CRNA  Anesthesia Plan Comments:         Anesthesia Quick Evaluation

## 2016-01-21 ENCOUNTER — Other Ambulatory Visit: Payer: Self-pay | Admitting: Internal Medicine

## 2016-01-21 ENCOUNTER — Encounter (HOSPITAL_COMMUNITY): Payer: Self-pay | Admitting: *Deleted

## 2016-01-21 ENCOUNTER — Inpatient Hospital Stay (HOSPITAL_COMMUNITY): Payer: Medicare Other | Admitting: Certified Registered Nurse Anesthetist

## 2016-01-21 ENCOUNTER — Encounter (HOSPITAL_COMMUNITY)
Admission: RE | Disposition: A | Discharge status: Home / Self Care | Payer: Self-pay | Source: Ambulatory Visit | Attending: Neurosurgery

## 2016-01-21 ENCOUNTER — Inpatient Hospital Stay (HOSPITAL_COMMUNITY): Payer: Medicare Other

## 2016-01-21 ENCOUNTER — Inpatient Hospital Stay (HOSPITAL_COMMUNITY)
Admission: RE | Admit: 2016-01-21 | Discharge: 2016-01-22 | DRG: 520 | Disposition: A | Discharge status: Home / Self Care | Payer: Medicare Other | Source: Ambulatory Visit | Attending: Neurosurgery | Admitting: Neurosurgery

## 2016-01-21 DIAGNOSIS — E119 Type 2 diabetes mellitus without complications: Secondary | ICD-10-CM | POA: Diagnosis present

## 2016-01-21 DIAGNOSIS — Z79899 Other long term (current) drug therapy: Secondary | ICD-10-CM | POA: Diagnosis not present

## 2016-01-21 DIAGNOSIS — I214 Non-ST elevation (NSTEMI) myocardial infarction: Secondary | ICD-10-CM | POA: Diagnosis not present

## 2016-01-21 DIAGNOSIS — Z419 Encounter for procedure for purposes other than remedying health state, unspecified: Secondary | ICD-10-CM

## 2016-01-21 DIAGNOSIS — Z4789 Encounter for other orthopedic aftercare: Secondary | ICD-10-CM | POA: Diagnosis not present

## 2016-01-21 DIAGNOSIS — Z7984 Long term (current) use of oral hypoglycemic drugs: Secondary | ICD-10-CM

## 2016-01-21 DIAGNOSIS — F17200 Nicotine dependence, unspecified, uncomplicated: Secondary | ICD-10-CM | POA: Diagnosis present

## 2016-01-21 DIAGNOSIS — M7138 Other bursal cyst, other site: Secondary | ICD-10-CM | POA: Diagnosis present

## 2016-01-21 DIAGNOSIS — M5416 Radiculopathy, lumbar region: Principal | ICD-10-CM | POA: Diagnosis present

## 2016-01-21 DIAGNOSIS — M1990 Unspecified osteoarthritis, unspecified site: Secondary | ICD-10-CM | POA: Diagnosis present

## 2016-01-21 DIAGNOSIS — I10 Essential (primary) hypertension: Secondary | ICD-10-CM | POA: Diagnosis present

## 2016-01-21 DIAGNOSIS — M5417 Radiculopathy, lumbosacral region: Secondary | ICD-10-CM | POA: Diagnosis not present

## 2016-01-21 DIAGNOSIS — I2510 Atherosclerotic heart disease of native coronary artery without angina pectoris: Secondary | ICD-10-CM | POA: Diagnosis present

## 2016-01-21 DIAGNOSIS — M545 Low back pain: Secondary | ICD-10-CM | POA: Diagnosis not present

## 2016-01-21 DIAGNOSIS — M7130 Other bursal cyst, unspecified site: Secondary | ICD-10-CM | POA: Diagnosis present

## 2016-01-21 HISTORY — PX: LUMBAR LAMINECTOMY/DECOMPRESSION MICRODISCECTOMY: SHX5026

## 2016-01-21 LAB — GLUCOSE, CAPILLARY
GLUCOSE-CAPILLARY: 129 mg/dL — AB (ref 65–99)
GLUCOSE-CAPILLARY: 144 mg/dL — AB (ref 65–99)
GLUCOSE-CAPILLARY: 137 mg/dL — AB (ref 65–99)
GLUCOSE-CAPILLARY: 176 mg/dL — AB (ref 65–99)
GLUCOSE-CAPILLARY: 136 mg/dL — AB (ref 65–99)

## 2016-01-21 SURGERY — LUMBAR LAMINECTOMY/DECOMPRESSION MICRODISCECTOMY 1 LEVEL
Anesthesia: General | Site: Spine Lumbar | Laterality: Right

## 2016-01-21 MED ORDER — PROPOFOL 10 MG/ML IV BOLUS
INTRAVENOUS | Status: DC | PRN
  Administered 2016-01-21: 40 mg via INTRAVENOUS
  Administered 2016-01-21: 120 mg via INTRAVENOUS

## 2016-01-21 MED ORDER — 0.9 % SODIUM CHLORIDE (POUR BTL) OPTIME
TOPICAL | Status: DC | PRN
  Administered 2016-01-21: 1000 mL

## 2016-01-21 MED ORDER — ONDANSETRON HCL 4 MG/2ML IJ SOLN
INTRAMUSCULAR | Status: DC | PRN
  Administered 2016-01-21: 4 mg via INTRAVENOUS

## 2016-01-21 MED ORDER — HEMOSTATIC AGENTS (NO CHARGE) OPTIME
TOPICAL | Status: DC | PRN
  Administered 2016-01-21: 1 via TOPICAL

## 2016-01-21 MED ORDER — DEXTROSE 5 % IV SOLN
10.0000 mg | INTRAVENOUS | Status: DC | PRN
  Administered 2016-01-21: 15 ug/min via INTRAVENOUS

## 2016-01-21 MED ORDER — PHENOL 1.4 % MT LIQD: 1.0000 | OROMUCOSAL | Status: DC | PRN

## 2016-01-21 MED ORDER — HYDROMORPHONE HCL 1 MG/ML IJ SOLN: 0.5000 mg | INTRAMUSCULAR | Status: DC | PRN

## 2016-01-21 MED ORDER — MIDAZOLAM HCL 5 MG/5ML IJ SOLN
INTRAMUSCULAR | Status: DC | PRN
  Administered 2016-01-21: 0.5 mg via INTRAVENOUS

## 2016-01-21 MED ORDER — FINASTERIDE 5 MG PO TABS
5.0000 mg | ORAL_TABLET | Freq: Every day | ORAL | Status: DC
  Administered 2016-01-22: 5 mg via ORAL
  Filled 2016-01-21: qty 1

## 2016-01-21 MED ORDER — VITAMIN D3 25 MCG (1000 UNIT) PO TABS
2000.0000 [IU] | ORAL_TABLET | Freq: Every day | ORAL | Status: DC
  Administered 2016-01-21 – 2016-01-22 (×2): 2000 [IU] via ORAL
  Filled 2016-01-21 (×4): qty 2

## 2016-01-21 MED ORDER — ACETAMINOPHEN 325 MG PO TABS: 650.0000 mg | ORAL_TABLET | ORAL | Status: DC | PRN

## 2016-01-21 MED ORDER — FAMOTIDINE IN NACL 20-0.9 MG/50ML-% IV SOLN
20.0000 mg | Freq: Two times a day (BID) | INTRAVENOUS | Status: DC
  Administered 2016-01-21 (×2): 20 mg via INTRAVENOUS
  Filled 2016-01-21 (×2): qty 50

## 2016-01-21 MED ORDER — SODIUM CHLORIDE 0.9% FLUSH: 3.0000 mL | Freq: Two times a day (BID) | INTRAVENOUS | Status: DC

## 2016-01-21 MED ORDER — DOXAZOSIN MESYLATE 4 MG PO TABS
4.0000 mg | ORAL_TABLET | Freq: Every day | ORAL | Status: DC
  Administered 2016-01-21: 4 mg via ORAL
  Filled 2016-01-21: qty 1

## 2016-01-21 MED ORDER — SUGAMMADEX SODIUM 200 MG/2ML IV SOLN
INTRAVENOUS | Status: DC | PRN
  Administered 2016-01-21: 164.2 mg via INTRAVENOUS

## 2016-01-21 MED ORDER — HYDROMORPHONE HCL 1 MG/ML IJ SOLN
INTRAMUSCULAR | Status: AC
  Administered 2016-01-21: 0.5 mg via INTRAVENOUS
  Filled 2016-01-21: qty 1

## 2016-01-21 MED ORDER — LACTATED RINGERS IV SOLN
INTRAVENOUS | Status: DC | PRN
  Administered 2016-01-21: 08:00:00 via INTRAVENOUS

## 2016-01-21 MED ORDER — ACETAMINOPHEN 650 MG RE SUPP: 650.0000 mg | RECTAL | Status: DC | PRN

## 2016-01-21 MED ORDER — THROMBIN 5000 UNITS EX SOLR
CUTANEOUS | Status: DC | PRN
  Administered 2016-01-21 (×2): 5000 [IU] via TOPICAL

## 2016-01-21 MED ORDER — KCL IN DEXTROSE-NACL 20-5-0.45 MEQ/L-%-% IV SOLN
INTRAVENOUS | Status: DC
  Administered 2016-01-21: 14:00:00 via INTRAVENOUS
  Filled 2016-01-21 (×4): qty 1000

## 2016-01-21 MED ORDER — CHLORHEXIDINE GLUCONATE CLOTH 2 % EX PADS: 6.0000 | MEDICATED_PAD | Freq: Once | CUTANEOUS | Status: DC

## 2016-01-21 MED ORDER — VECURONIUM BROMIDE 10 MG IV SOLR
INTRAVENOUS | Status: DC | PRN
  Administered 2016-01-21 (×2): 2 mg via INTRAVENOUS
  Administered 2016-01-21: 5 mg via INTRAVENOUS

## 2016-01-21 MED ORDER — MENTHOL 3 MG MT LOZG: 1.0000 | LOZENGE | OROMUCOSAL | Status: DC | PRN

## 2016-01-21 MED ORDER — OXYCODONE-ACETAMINOPHEN 5-325 MG PO TABS
1.0000 | ORAL_TABLET | ORAL | Status: DC | PRN
  Administered 2016-01-21 – 2016-01-22 (×4): 2 via ORAL
  Filled 2016-01-21 (×4): qty 2

## 2016-01-21 MED ORDER — SENNOSIDES-DOCUSATE SODIUM 8.6-50 MG PO TABS: 1.0000 | ORAL_TABLET | Freq: Every evening | ORAL | Status: DC | PRN

## 2016-01-21 MED ORDER — FENTANYL CITRATE (PF) 100 MCG/2ML IJ SOLN
INTRAMUSCULAR | Status: DC | PRN
  Administered 2016-01-21: 100 ug via INTRAVENOUS

## 2016-01-21 MED ORDER — BISACODYL 10 MG RE SUPP: 10.0000 mg | Freq: Every day | RECTAL | Status: DC | PRN

## 2016-01-21 MED ORDER — FENTANYL CITRATE (PF) 100 MCG/2ML IJ SOLN
INTRAMUSCULAR | Status: DC | PRN
  Administered 2016-01-21 (×3): 50 ug via INTRAVENOUS

## 2016-01-21 MED ORDER — METFORMIN HCL ER 500 MG PO TB24
1000.0000 mg | ORAL_TABLET | Freq: Two times a day (BID) | ORAL | Status: DC
  Administered 2016-01-21 – 2016-01-22 (×2): 1000 mg via ORAL
  Filled 2016-01-21 (×2): qty 2

## 2016-01-21 MED ORDER — LISINOPRIL 5 MG PO TABS
5.0000 mg | ORAL_TABLET | Freq: Every day | ORAL | Status: DC
  Administered 2016-01-22: 5 mg via ORAL
  Filled 2016-01-21 (×2): qty 1

## 2016-01-21 MED ORDER — MAGNESIUM OXIDE 400 (241.3 MG) MG PO TABS
400.0000 mg | ORAL_TABLET | Freq: Every day | ORAL | Status: DC
  Administered 2016-01-21 – 2016-01-22 (×2): 400 mg via ORAL
  Filled 2016-01-21 (×3): qty 1

## 2016-01-21 MED ORDER — MIDAZOLAM HCL 2 MG/2ML IJ SOLN
INTRAMUSCULAR | Status: AC
  Filled 2016-01-21: qty 2

## 2016-01-21 MED ORDER — CINNAMON 500 MG PO CAPS: 1000.0000 mg | ORAL_CAPSULE | Freq: Every day | ORAL | Status: DC

## 2016-01-21 MED ORDER — METHOCARBAMOL 1000 MG/10ML IJ SOLN
500.0000 mg | Freq: Four times a day (QID) | INTRAVENOUS | Status: DC | PRN
  Filled 2016-01-21: qty 5

## 2016-01-21 MED ORDER — SODIUM CHLORIDE 0.9 % IV SOLN: 250.0000 mL | INTRAVENOUS | Status: DC

## 2016-01-21 MED ORDER — FENTANYL CITRATE (PF) 100 MCG/2ML IJ SOLN
INTRAMUSCULAR | Status: AC
  Filled 2016-01-21: qty 2

## 2016-01-21 MED ORDER — ALUM & MAG HYDROXIDE-SIMETH 200-200-20 MG/5ML PO SUSP: 30.0000 mL | Freq: Four times a day (QID) | ORAL | Status: DC | PRN

## 2016-01-21 MED ORDER — FLEET ENEMA 7-19 GM/118ML RE ENEM: 1.0000 | ENEMA | Freq: Once | RECTAL | Status: DC | PRN

## 2016-01-21 MED ORDER — CEFAZOLIN IN D5W 1 GM/50ML IV SOLN
1.0000 g | Freq: Three times a day (TID) | INTRAVENOUS | Status: AC
  Administered 2016-01-21 (×2): 1 g via INTRAVENOUS
  Filled 2016-01-21 (×2): qty 50

## 2016-01-21 MED ORDER — OXYCODONE-ACETAMINOPHEN 5-325 MG PO TABS
ORAL_TABLET | ORAL | Status: AC
  Filled 2016-01-21: qty 2

## 2016-01-21 MED ORDER — HYDROCODONE-ACETAMINOPHEN 5-325 MG PO TABS: 1.0000 | ORAL_TABLET | ORAL | Status: DC | PRN

## 2016-01-21 MED ORDER — BUPIVACAINE HCL (PF) 0.5 % IJ SOLN
INTRAMUSCULAR | Status: DC | PRN
  Administered 2016-01-21: 5 mL

## 2016-01-21 MED ORDER — METHYLPREDNISOLONE ACETATE 80 MG/ML IJ SUSP
INTRAMUSCULAR | Status: DC | PRN
  Administered 2016-01-21: 80 mg

## 2016-01-21 MED ORDER — METHOCARBAMOL 500 MG PO TABS
500.0000 mg | ORAL_TABLET | Freq: Four times a day (QID) | ORAL | Status: DC | PRN
  Administered 2016-01-21: 500 mg via ORAL
  Filled 2016-01-21: qty 1

## 2016-01-21 MED ORDER — FENTANYL CITRATE (PF) 250 MCG/5ML IJ SOLN
INTRAMUSCULAR | Status: AC
  Filled 2016-01-21: qty 5

## 2016-01-21 MED ORDER — DOCUSATE SODIUM 100 MG PO CAPS
100.0000 mg | ORAL_CAPSULE | Freq: Two times a day (BID) | ORAL | Status: DC
  Administered 2016-01-21 – 2016-01-22 (×3): 100 mg via ORAL
  Filled 2016-01-21 (×3): qty 1

## 2016-01-21 MED ORDER — EPHEDRINE SULFATE-NACL 50-0.9 MG/10ML-% IV SOSY
PREFILLED_SYRINGE | INTRAVENOUS | Status: DC | PRN
  Administered 2016-01-21 (×3): 5 mg via INTRAVENOUS

## 2016-01-21 MED ORDER — LIDOCAINE-EPINEPHRINE 1 %-1:100000 IJ SOLN
INTRAMUSCULAR | Status: DC | PRN
  Administered 2016-01-21: 5 mL

## 2016-01-21 MED ORDER — EZETIMIBE 10 MG PO TABS
10.0000 mg | ORAL_TABLET | Freq: Every day | ORAL | Status: DC
  Administered 2016-01-22: 10 mg via ORAL
  Filled 2016-01-21: qty 1

## 2016-01-21 MED ORDER — PROPOFOL 10 MG/ML IV BOLUS
INTRAVENOUS | Status: AC
  Filled 2016-01-21: qty 40

## 2016-01-21 MED ORDER — HYDROMORPHONE HCL 1 MG/ML IJ SOLN
0.2500 mg | INTRAMUSCULAR | Status: DC | PRN
  Administered 2016-01-21 (×4): 0.5 mg via INTRAVENOUS

## 2016-01-21 MED ORDER — ONDANSETRON HCL 4 MG/2ML IJ SOLN: 4.0000 mg | INTRAMUSCULAR | Status: DC | PRN

## 2016-01-21 MED ORDER — SODIUM CHLORIDE 0.9% FLUSH: 3.0000 mL | INTRAVENOUS | Status: DC | PRN

## 2016-01-21 MED ORDER — ASPIRIN EC 81 MG PO TBEC
81.0000 mg | DELAYED_RELEASE_TABLET | ORAL | Status: DC
  Administered 2016-01-21: 81 mg via ORAL
  Filled 2016-01-21: qty 1

## 2016-01-21 MED ORDER — ARTIFICIAL TEARS OP OINT
TOPICAL_OINTMENT | OPHTHALMIC | Status: DC | PRN
  Administered 2016-01-21: 65 via OPHTHALMIC

## 2016-01-21 MED ORDER — LIDOCAINE 2% (20 MG/ML) 5 ML SYRINGE
INTRAMUSCULAR | Status: DC | PRN
  Administered 2016-01-21: 60 mg via INTRAVENOUS

## 2016-01-21 SURGICAL SUPPLY — 66 items
ADH SKN CLS APL DERMABOND .7 (GAUZE/BANDAGES/DRESSINGS) ×1
APL SKNCLS STERI-STRIP NONHPOA (GAUZE/BANDAGES/DRESSINGS)
BENZOIN TINCTURE PRP APPL 2/3 (GAUZE/BANDAGES/DRESSINGS) IMPLANT
BIT DRILL NEURO 2X3.1 SFT TUCH (MISCELLANEOUS) ×1 IMPLANT
BLADE CLIPPER SURG (BLADE) IMPLANT
BUR ROUND FLUTED 5 RND (BURR) ×2 IMPLANT
BUR ROUND FLUTED 5MM RND (BURR) ×1
CANISTER SUCT 3000ML PPV (MISCELLANEOUS) ×3 IMPLANT
CLOSURE WOUND 1/2 X4 (GAUZE/BANDAGES/DRESSINGS)
DECANTER SPIKE VIAL GLASS SM (MISCELLANEOUS) ×5 IMPLANT
DERMABOND ADVANCED (GAUZE/BANDAGES/DRESSINGS) ×2
DERMABOND ADVANCED .7 DNX12 (GAUZE/BANDAGES/DRESSINGS) ×1 IMPLANT
DRAPE LAPAROTOMY 100X72X124 (DRAPES) ×3 IMPLANT
DRAPE MICROSCOPE LEICA (MISCELLANEOUS) ×3 IMPLANT
DRAPE POUCH INSTRU U-SHP 10X18 (DRAPES) ×3 IMPLANT
DRAPE SURG 17X23 STRL (DRAPES) ×3 IMPLANT
DRILL NEURO 2X3.1 SOFT TOUCH (MISCELLANEOUS) ×3
DRSG OPSITE POSTOP 4X6 (GAUZE/BANDAGES/DRESSINGS) ×2 IMPLANT
DURAPREP 26ML APPLICATOR (WOUND CARE) ×3 IMPLANT
ELECT REM PT RETURN 9FT ADLT (ELECTROSURGICAL) ×3
ELECTRODE REM PT RTRN 9FT ADLT (ELECTROSURGICAL) ×1 IMPLANT
GAUZE SPONGE 4X4 12PLY STRL (GAUZE/BANDAGES/DRESSINGS) IMPLANT
GAUZE SPONGE 4X4 16PLY XRAY LF (GAUZE/BANDAGES/DRESSINGS) IMPLANT
GLOVE BIO SURGEON STRL SZ8 (GLOVE) ×3 IMPLANT
GLOVE BIOGEL PI IND STRL 6.5 (GLOVE) IMPLANT
GLOVE BIOGEL PI IND STRL 7.5 (GLOVE) IMPLANT
GLOVE BIOGEL PI IND STRL 8 (GLOVE) ×1 IMPLANT
GLOVE BIOGEL PI IND STRL 8.5 (GLOVE) ×1 IMPLANT
GLOVE BIOGEL PI INDICATOR 6.5 (GLOVE) ×2
GLOVE BIOGEL PI INDICATOR 7.5 (GLOVE) ×2
GLOVE BIOGEL PI INDICATOR 8 (GLOVE) ×6
GLOVE BIOGEL PI INDICATOR 8.5 (GLOVE) ×2
GLOVE ECLIPSE 7.5 STRL STRAW (GLOVE) ×6 IMPLANT
GLOVE ECLIPSE 8.0 STRL XLNG CF (GLOVE) ×3 IMPLANT
GLOVE EXAM NITRILE LRG STRL (GLOVE) IMPLANT
GLOVE EXAM NITRILE MD LF STRL (GLOVE) IMPLANT
GLOVE EXAM NITRILE XL STR (GLOVE) IMPLANT
GLOVE EXAM NITRILE XS STR PU (GLOVE) IMPLANT
GLOVE SURG SS PI 6.5 STRL IVOR (GLOVE) ×4 IMPLANT
GOWN STRL REUS W/ TWL LRG LVL3 (GOWN DISPOSABLE) IMPLANT
GOWN STRL REUS W/ TWL XL LVL3 (GOWN DISPOSABLE) ×1 IMPLANT
GOWN STRL REUS W/TWL 2XL LVL3 (GOWN DISPOSABLE) ×5 IMPLANT
GOWN STRL REUS W/TWL LRG LVL3 (GOWN DISPOSABLE) ×3
GOWN STRL REUS W/TWL XL LVL3 (GOWN DISPOSABLE) ×3
KIT BASIN OR (CUSTOM PROCEDURE TRAY) ×3 IMPLANT
KIT ROOM TURNOVER OR (KITS) ×3 IMPLANT
NDL BLUNT 18X1 FOR OR ONLY (NEEDLE) IMPLANT
NDL HYPO 18GX1.5 BLUNT FILL (NEEDLE) IMPLANT
NDL HYPO 25X1 1.5 SAFETY (NEEDLE) ×1 IMPLANT
NEEDLE BLUNT 18X1 FOR OR ONLY (NEEDLE) ×3 IMPLANT
NEEDLE HYPO 18GX1.5 BLUNT FILL (NEEDLE) IMPLANT
NEEDLE HYPO 25X1 1.5 SAFETY (NEEDLE) ×3 IMPLANT
NS IRRIG 1000ML POUR BTL (IV SOLUTION) ×3 IMPLANT
PACK LAMINECTOMY NEURO (CUSTOM PROCEDURE TRAY) ×3 IMPLANT
PAD ARMBOARD 7.5X6 YLW CONV (MISCELLANEOUS) ×13 IMPLANT
RUBBERBAND STERILE (MISCELLANEOUS) ×6 IMPLANT
SPONGE SURGIFOAM ABS GEL SZ50 (HEMOSTASIS) ×3 IMPLANT
STRIP CLOSURE SKIN 1/2X4 (GAUZE/BANDAGES/DRESSINGS) IMPLANT
SUT VIC AB 0 CT1 18XCR BRD8 (SUTURE) ×1 IMPLANT
SUT VIC AB 0 CT1 8-18 (SUTURE) ×3
SUT VIC AB 2-0 CT1 18 (SUTURE) ×3 IMPLANT
SUT VIC AB 3-0 SH 8-18 (SUTURE) ×3 IMPLANT
SYR 5ML LL (SYRINGE) ×2 IMPLANT
TOWEL OR 17X24 6PK STRL BLUE (TOWEL DISPOSABLE) ×3 IMPLANT
TOWEL OR 17X26 10 PK STRL BLUE (TOWEL DISPOSABLE) ×3 IMPLANT
WATER STERILE IRR 1000ML POUR (IV SOLUTION) ×3 IMPLANT

## 2016-01-21 NOTE — Anesthesia Procedure Notes (Signed)
Procedure Name: Intubation Date/Time: 01/21/2016 9:26 AM Performed by: Bethel Born Pre-anesthesia Checklist: Patient identified, Emergency Drugs available, Suction available, Timeout performed and Patient being monitored Patient Re-evaluated:Patient Re-evaluated prior to inductionOxygen Delivery Method: Circle system utilized Preoxygenation: Pre-oxygenation with 100% oxygen Intubation Type: IV induction Ventilation: Mask ventilation without difficulty and Oral airway inserted - appropriate to patient size Laryngoscope Size: Glidescope and 3 Grade View: Grade I Tube type: Oral Tube size: 7.5 mm Number of attempts: 1 Airway Equipment and Method: Rigid stylet and Video-laryngoscopy Placement Confirmation: ETT inserted through vocal cords under direct vision,  positive ETCO2 and breath sounds checked- equal and bilateral Secured at: 23 cm Tube secured with: Tape Dental Injury: Teeth and Oropharynx as per pre-operative assessment  Difficulty Due To: Difficulty was anticipated and Difficult Airway- due to anterior larynx

## 2016-01-21 NOTE — Brief Op Note (Signed)
01/21/2016  11:06 AM  PATIENT:  David [name]  80 y.o. male  PRE-OPERATIVE DIAGNOSIS:  Recurrent Synovial cyst Right L 5 S 1 facet joint with right lumbar radiculopathy  POST-OPERATIVE DIAGNOSIS:  Recurrent Synovial cyst Right L 5 S 1 facet joint with right lumbar radiculopathy  PROCEDURE:  Procedure(s) with comments: Redo Right Lumbar Five-Sacral One Laminectomy for synovial cyst (Right) - right with microdissection  SURGEON:  Surgeon(s) and Role:    * Erline Levine, MD - Primary  PHYSICIAN ASSISTANT:   ASSISTANTS: none   ANESTHESIA:   general  EBL:  Total I/O In: 700 [I.V.:700] Out: 15 [Blood:15]  BLOOD ADMINISTERED:none  DRAINS: none   LOCAL MEDICATIONS USED:  LIDOCAINE   SPECIMEN:  Excision  DISPOSITION OF SPECIMEN:  PATHOLOGY  COUNTS:  YES  TOURNIQUET:  * No tourniquets in log *  DICTATION: Patient has a large L 5- S 1 recurrent synovial cyst on the right with significant right leg pain and weakness.   It was elected to take him to surgery for redo right L 5 S 1 laminectomy and resection of synovial cyst.  Procedure: Patient was brought to the operating room and following the smooth and uncomplicated induction of general endotracheal anesthesia he was placed in a prone position on the Wilson frame. Low back was prepped and draped in the usual sterile fashion with betadine scrub and DuraPrep. Area of planned incision was infiltrated with local lidocaine. Incision was made in the midline and carried to the lumbodorsal fascia which was incised on the right side of midline. Subperiosteal dissection was performed exposing what was felt to be  L 5 -S 1 level. Intraoperative x-ray demonstrated marker probe at L5-S1.  A redo laminotomy of L 5 was performed as well as a redo foraminotomy overlying S1. Ligamentum was mobilized and detached, and scar tissue was carefully cleared overlying the thecal sac and S 1 nerve root.  Ligamentum flavum was detached and removed in a piecemeal  fashion and the S 1 nerve root was decompressed laterally with removal of the superior aspect of the facet and ligamentum causing nerve root compression. The microscope was brought into the field and the S 1 nerve root was mobilized medially and carefully dissected from the synovial cyst which was adherent to the dura.  Using microdissection, the cyst was removed and neural elements thoroughly decompressed. The cyst was removed in its entirety and sent to Pathology for analysis.  There was no violation of the dura.  Hemostasis was assured with bipolar electrocautery and the interspace was irrigated with Depo-Medrol and fentanyl. The lumbodorsal fascia was closed with 0 Vicryl sutures the subcutaneous tissues reapproximated 2-0 Vicryl inverted sutures and the skin edges were reapproximated with 3-0 Vicryl subcuticular stitch. The wound is dressed with Dermabond. Patient was extubated in the operating room and taken to recovery in stable and satisfactory condition having tolerated his operation well counts were correct at the end of the case.    PLAN OF CARE: Admit to inpatient   PATIENT DISPOSITION:  PACU - hemodynamically stable.   Delay start of Pharmacological VTE agent (>24hrs) due to surgical blood loss or risk of bleeding: yes

## 2016-01-21 NOTE — Anesthesia Postprocedure Evaluation (Signed)
Anesthesia Post Note  Patient: David [name]  Procedure(s) Performed: Procedure(s) (LRB): Redo Right Lumbar Five-Sacral One Laminectomy for synovial cyst (Right)  Patient location during evaluation: PACU Anesthesia Type: General Level of consciousness: awake and alert Pain management: pain level controlled Vital Signs Assessment: post-procedure vital signs reviewed and stable Respiratory status: spontaneous breathing, nonlabored ventilation and respiratory function stable Cardiovascular status: blood pressure returned to baseline and stable Postop Assessment: no signs of nausea or vomiting Anesthetic complications: no    Last Vitals:  Filed Vitals:   01/21/16 1214 01/21/16 1230  BP:  107/46  Pulse:  61  Temp: 36.7 C 37 C  Resp: 17 18    Last Pain:  Filed Vitals:   01/21/16 1249  PainSc: 2                  Tinisha Etzkorn,W. EDMOND

## 2016-01-21 NOTE — Transfer of Care (Signed)
Immediate Anesthesia Transfer of Care Note  Patient: David [name]  Procedure(s) Performed: Procedure(s) with comments: Redo Right Lumbar Five-Sacral One Laminectomy for synovial cyst (Right) - right  Patient Location: PACU  Anesthesia Type:General  Level of Consciousness: awake, alert , oriented and patient cooperative  Airway & Oxygen Therapy: Patient Spontanous Breathing and Patient connected to nasal cannula oxygen  Post-op Assessment: Report given to RN and Post -op Vital signs reviewed and stable  Post vital signs: Reviewed and stable  Last Vitals:  Filed Vitals:   01/21/16 0552 01/21/16 1059  BP: 109/56 110/68  Pulse: 57   Temp: 36.6 C 36.6 C  Resp: 20 25    Last Pain: There were no vitals filed for this visit.       Complications: No apparent anesthesia complications

## 2016-01-21 NOTE — Op Note (Signed)
01/21/2016  11:06 AM  PATIENT:  David [name]  80 y.o. male  PRE-OPERATIVE DIAGNOSIS:  Recurrent Synovial cyst Right L 5 S 1 facet joint with right lumbar radiculopathy  POST-OPERATIVE DIAGNOSIS:  Recurrent Synovial cyst Right L 5 S 1 facet joint with right lumbar radiculopathy  PROCEDURE:  Procedure(s) with comments: Redo Right Lumbar Five-Sacral One Laminectomy for synovial cyst (Right) - right with microdissection  SURGEON:  Surgeon(s) and Role:    * Erline Levine, MD - Primary  PHYSICIAN ASSISTANT:   ASSISTANTS: none   ANESTHESIA:   general  EBL:  Total I/O In: 700 [I.V.:700] Out: 15 [Blood:15]  BLOOD ADMINISTERED:none  DRAINS: none   LOCAL MEDICATIONS USED:  LIDOCAINE   SPECIMEN:  Excision  DISPOSITION OF SPECIMEN:  PATHOLOGY  COUNTS:  YES  TOURNIQUET:  * No tourniquets in log *  DICTATION: Patient has a large L 5- S 1 recurrent synovial cyst on the right with significant right leg pain and weakness.   It was elected to take him to surgery for redo right L 5 S 1 laminectomy and resection of synovial cyst.  Procedure: Patient was brought to the operating room and following the smooth and uncomplicated induction of general endotracheal anesthesia he was placed in a prone position on the Wilson frame. Low back was prepped and draped in the usual sterile fashion with betadine scrub and DuraPrep. Area of planned incision was infiltrated with local lidocaine. Incision was made in the midline and carried to the lumbodorsal fascia which was incised on the right side of midline. Subperiosteal dissection was performed exposing what was felt to be  L 5 -S 1 level. Intraoperative x-ray demonstrated marker probe at L5-S1.  A redo laminotomy of L 5 was performed as well as a redo foraminotomy overlying S1. Ligamentum was mobilized and detached, and scar tissue was carefully cleared overlying the thecal sac and S 1 nerve root.  Ligamentum flavum was detached and removed in a piecemeal  fashion and the S 1 nerve root was decompressed laterally with removal of the superior aspect of the facet and ligamentum causing nerve root compression. The microscope was brought into the field and the S 1 nerve root was mobilized medially and carefully dissected from the synovial cyst which was adherent to the dura.  Using microdissection, the cyst was removed and neural elements thoroughly decompressed. The cyst was removed in its entirety and sent to Pathology for analysis.  There was no violation of the dura.  Hemostasis was assured with bipolar electrocautery and the interspace was irrigated with Depo-Medrol and fentanyl. The lumbodorsal fascia was closed with 0 Vicryl sutures the subcutaneous tissues reapproximated 2-0 Vicryl inverted sutures and the skin edges were reapproximated with 3-0 Vicryl subcuticular stitch. The wound is dressed with Dermabond. Patient was extubated in the operating room and taken to recovery in stable and satisfactory condition having tolerated his operation well counts were correct at the end of the case.    PLAN OF CARE: Admit to inpatient   PATIENT DISPOSITION:  PACU - hemodynamically stable.   Delay start of Pharmacological VTE agent (>24hrs) due to surgical blood loss or risk of bleeding: yes

## 2016-01-21 NOTE — H&P (Signed)
Patient ID:   [id number] Patient: David [name]  Date of Birth: [birth year] Visit Type: Office Visit   Date: 01/09/2016 01:15 PM Provider: Marchia Meiers. Vertell Limber MD   This 80 year old male presents for back pain.  History of Present Illness: 1.  back pain    08/09/15 L4-L5, L5-S1 decompressive lumbar laminectomy with resection of bilateral synovial cysts at L5-S1  Patient returns to review his MRI.  Right leg pain persists for three and a half weeks without improvement after steroid taper. He has no pain in his left leg. He has no pain when he is sitting.   Taking plavix  Physical Exam: Negative SLR on the right. Motor strength is full and intact.   01/03/16 MRI: Symptomatic level favored to be L5-S1 where there is chronic severe facet arthropathy and a chronic right side synovial cyst has more than doubled in size since November, now resulting in severe right lateral recess stenosis at the level of the right S1 nerve roots. Increased left greater than right L5 pedicle marrow edema, favor related to altered biomechanics. Interval decompression at L4-5 with resolved spinal stenosis.      Medical/Surgical/Interim History Reviewed, no change.   PAST MEDICAL HISTORY, SURGICAL HISTORY, FAMILY HISTORY, SOCIAL HISTORY AND REVIEW OF SYSTEMS  06/06/2015, which I have signed.  Family History: Reviewed, no changes.    Social History: Tobacco use reviewed. Reviewed, no changes.     MEDICATIONS(added, continued or stopped this visit): Started Medication Directions Instruction Stopped   aspirin 81 mg tablet,delayed release take 1 tablet by oral route  every day     Cardura 4 mg tablet take 1 tablet by oral route  every day     Cinnamon 500 mg capsule Take as directed     magnesium 500 mg ORAL Take as directed     metformin 500 mg tablet take 1 tablet by oral route 2 times every day with morning and evening meals     Plavix 75 mg tablet take 1 tablet by oral route  every day     Proscar 5  mg tablet take 1 tablet by oral route  every day    08/13/2015 tizanidine 2 mg tablet take 1 - 2 tablet by oral route 3 times every day as needed for spasm Please cancel Robaxin. This is replacement. Thanks.    Vitamin D3 1,000 unit capsule Take as directed     Zestril 5 mg tablet take 1 tablet by oral route  every day     Zetia 10 mg tablet take 1 tablet by oral route  every day       ALLERGIES: Ingredient Reaction Medication Name Comment  TETANUS VACCINES AND TOXOID         Vitals Date Temp F BP Pulse Ht In Wt Lb BMI BSA Pain Score  01/09/2016  110/69 93 70.5 182 25.74  10/10      IMPRESSION The patient is experiencing "extreme" right leg pain. On review of his MRI, he has chronic facet arthropathy and a right side synovial cyst at L5-S1 that has re-appeared since his last resection surgery. We will need to do a redo right L5-S1 laminectomy with resection of synovial cyst for alleviation of his symptoms. He will need to be off his plavix for a minimum of 10 days before surgery.   Pain Assessment/Treatment Pain Scale: 10/10. Method: Numeric Pain Intensity Scale. Location: back. Onset: 09/02/2014. Duration: varies. Quality: discomforting. Pain Assessment/Treatment follow-up plan of care: Patient is taking medications  prescribed.  Fall Risk Plan The Patient has fallen 2 times in the last year.  Falls risk follow-up plan of care: Assisted devices: Advised of safetly measures when available..  Prescribed dilaudid. Schedule redo right L5-S1 laminectomy with resection of synovial cyst. Nurse education given.              Provider:  Marchia Meiers. Vertell Limber MD  01/09/2016 01:54 PM Dictation edited by: Johnella Moloney    CC Providers: Erline Levine MD [address] [place], [place] [address]              Electronically signed by Marchia Meiers. Vertell Limber MD on 01/09/2016 02:39 PM

## 2016-01-21 NOTE — Interval H&P Note (Signed)
History and Physical Interval Note:  01/21/2016 9:17 AM  David [name]  has presented today for surgery, with the diagnosis of Synovial cyst  The various methods of treatment have been discussed with the patient and family. After consideration of risks, benefits and other options for treatment, the patient has consented to  Procedure(s) with comments: Redo Right L5-S1 Laminectomy for synovial cyst (Right) - Redo Right L5-S1 Laminectomy for synovial cyst as a surgical intervention .  The patient's history has been reviewed, patient examined, no change in status, stable for surgery.  I have reviewed the patient's chart and labs.  Questions were answered to the patient's satisfaction.     Sophia Sperry D

## 2016-01-21 NOTE — Progress Notes (Signed)
Awake, alert, conversant.  MAEW with full strength both legs.  No numbness.  Doing well.

## 2016-01-22 LAB — GLUCOSE, CAPILLARY: Glucose-Capillary: 137 mg/dL — ABNORMAL HIGH (ref 65–99)

## 2016-01-22 MED ORDER — OXYCODONE-ACETAMINOPHEN 5-325 MG PO TABS: 1.0000 | ORAL_TABLET | Freq: Four times a day (QID) | ORAL | Status: DC | PRN

## 2016-01-22 NOTE — Evaluation (Signed)
Physical Therapy Evaluation and Discharge Patient Details Name: David [name] MRN: [id number] DOB: [birth year] Today's Date: 01/22/2016   History of Present Illness  [name] admitted with the diagnosis of Synovial cyst at R L5-S1. Pt underwent  Redo Right Lumbar Five-Sacral One Laminectomy for synovial cyst (Right) - right with microdissection on 7/3. PSH: 08/09/15 L4-L5, L5-S1 decompressive lumbar laminectomy with resection of bilateral synovial cysts at L5-S1. PMH: DM II.  Clinical Impression  Patient is s/p above surgery resulting in the deficits listed below (see PT Problem List).Pt functioning at modified indep. Patient will benefit from skilled PT to increase their independence and safety with mobility (while adhering to their precautions) to allow discharge to the venue listed below. Pt with no further acute PT needs at this time. PT SIGNING OFF. Please re-consult if needed in future.    Follow Up Recommendations No PT follow up    Equipment Recommendations  None recommended by PT    Recommendations for Other Services       Precautions / Restrictions Precautions Precautions: Back Precaution Booklet Issued: Yes (comment) Precaution Comments: pt educated, however pt non-compliant, v/c's to adhere Restrictions Weight Bearing Restrictions: No      Mobility  Bed Mobility Overal bed mobility: Modified Independent             General bed mobility comments: v/c's for logroll technique  Transfers Overall transfer level: Modified independent Equipment used: None             General transfer comment: v/c's for hand placement  Ambulation/Gait Ambulation/Gait assistance: Supervision Ambulation Distance (Feet): 200 Feet Assistive device: None   Gait velocity: wfl Gait velocity interpretation: at or above normal speed for age/gender General Gait Details: no antalgia, no episodes of instability  Stairs Stairs: Yes Stairs assistance: Modified independent  (Device/Increase time) Stair Management: One rail Left;Alternating pattern Number of Stairs: 10 General stair comments: wfl  Wheelchair Mobility    Modified Rankin (Stroke Patients Only)       Balance Overall balance assessment: No apparent balance deficits (not formally assessed)                                           Pertinent Vitals/Pain Pain Assessment: 0-10 Pain Score: 2  Pain Location: incision Pain Descriptors / Indicators:  (stinging) Pain Intervention(s): Monitored during session    Home Living Family/patient expects to be discharged to:: Private residence Living Arrangements: Spouse/significant other Available Help at Discharge: Family;Available 24 hours/day Type of Home: House Home Access: Stairs to enter Entrance Stairs-Rails: Right Entrance Stairs-Number of Steps: 5-6 Home Layout: Two level;Laundry or work area in [employer]: Bedside commode;Shower seat - built in;Cane - single point      Prior Function Level of Independence: Independent               Hand Dominance   Dominant Hand: Right    Extremity/Trunk Assessment   Upper Extremity Assessment: Overall WFL for tasks assessed           Lower Extremity Assessment: Overall WFL for tasks assessed      Cervical / Trunk Assessment: Normal  Communication   Communication: No difficulties  Cognition Arousal/Alertness: Awake/alert Behavior During Therapy: WFL for tasks assessed/performed Overall Cognitive Status: Within Functional Limits for tasks assessed  General Comments General comments (skin integrity, edema, etc.): pt stuck in his ways but was receptive to education on precautions    Exercises        Assessment/Plan    PT Assessment Patent does not need any further PT services  PT Diagnosis Generalized weakness   PT Problem List    PT Treatment Interventions     PT Goals (Current goals can be found in the Care  Plan section) Acute Rehab PT Goals Patient Stated Goal: home today PT Goal Formulation: All assessment and education complete, DC therapy    Frequency     Barriers to discharge        Co-evaluation               End of Session Equipment Utilized During Treatment: Gait belt Activity Tolerance: Patient tolerated treatment well Patient left: in chair;with call bell/phone within reach Nurse Communication: Mobility status         Time: 0727-0802 PT Time Calculation (min) (ACUTE ONLY): 35 min   Charges:   PT Evaluation $PT Eval Low Complexity: 1 Procedure PT Treatments $Gait Training: 8-22 mins   PT G CodesKingsley Callander 01/22/2016, 8:49 AM   Kittie Plater, PT, DPT Pager #: [phone] Office #: [phone]

## 2016-01-22 NOTE — Progress Notes (Signed)
Patient is discharged from room 5C02 at this time. Alert and in stable condition. IV site d/c'd and instructions read to patient and understanding verbalized. Left unit via wheelchair with all belongings at side.

## 2016-01-22 NOTE — Discharge Summary (Signed)
   David [name] MRN: [id number] DOB/AGE: [birth year] 80 y.o.  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 was admitted and taken to the operating room for an uncomplicated synovial cyst resection on the right side at L5/S1. Post op his wound is clean, and dry. There are no signs of infection. He has normal strength in the lower extremities. He is voiding, ambulating, and tolerating a regular diet at discharge.   Treatments: surgery: Redo Right Lumbar Five-Sacral One Laminectomy for synovial cyst (Right) - right with microdissection   Discharge Exam: Blood pressure 106/61, pulse 48, temperature 98.1 F (36.7 C), temperature source Oral, resp. rate 18, height 5\' 4"  (1.626 m), weight 84.3 kg (185 lb 13.6 oz), SpO2 98 %. General appearance: alert, cooperative, appears stated age and no distress  Disposition: 01-Home or Self Care Synovial cyst    Medication List    TAKE these medications        aspirin EC 81 MG tablet  Take 1 tablet (81 mg total) by mouth every other day.     cholecalciferol 1000 units tablet  Commonly known as:  VITAMIN D  Take 2,000 Units by mouth daily.     CINNAMON PO  Take 1,000 mg by mouth daily.     clopidogrel 75 MG tablet  Commonly known as:  PLAVIX  Take 75 mg by mouth daily.     doxazosin 4 MG tablet  Commonly known as:  CARDURA  TAKE 1 TABLET BY MOUTH AT BEDTIME     finasteride 5 MG tablet  Commonly known as:  PROSCAR  Take 5 mg by mouth daily.     lisinopril 5 MG tablet  Commonly known as:  PRINIVIL,ZESTRIL  TAKE 1 TABLET BY MOUTH DAILY.     Magnesium 500 MG Caps  Take 1 capsule by mouth daily.     metFORMIN 500 MG 24 hr tablet  Commonly known as:  GLUCOPHAGE-XR  TAKE 2 TABLETS BY MOUTH TWICE DAILY     oxyCODONE-acetaminophen 5-325 MG tablet  Commonly known as:  PERCOCET/ROXICET  Take 1-2 tablets by mouth every 6 (six) hours as needed for moderate pain.     ZETIA 10 MG tablet  Generic drug:  ezetimibe  TAKE 1 TABLET BY MOUTH EVERY DAY           Follow-up Information    Follow up with Peggyann Shoals, MD.   Specialty:  Neurosurgery   Why:  Appointment scheduled   Contact information:   [address] [place] [address] [phone]       Signed: Winfield Cunas 01/22/2016, 8:15 AM

## 2016-01-22 NOTE — Care Management Note (Signed)
Case Management Note  Patient Details  Name: David [name] MRN: [id number] Date of Birth: [birth year]  Subjective/Objective:                    Action/Plan: Pt discharging home with self care. No f/u recommendations per PT. No further needs per CM.   Expected Discharge Date:   (Pending)               Expected Discharge Plan:  Home/Self Care  In-House Referral:     Discharge planning Services     Post Acute Care Choice:    Choice offered to:     DME Arranged:    DME Agency:     HH Arranged:    [place] Agency:     Status of Service:  Completed, signed off  If discussed at [employer] of Stay Meetings, dates discussed:    Additional Comments:  Pollie Friar, RN 01/22/2016, 9:54 AM

## 2016-01-23 ENCOUNTER — Encounter (HOSPITAL_COMMUNITY): Payer: Self-pay | Admitting: Neurosurgery

## 2016-02-21 IMAGING — CR DG CHEST 2V
2 series · 2 of 2 positions shown · non-contrast
Comparison: 09/10/2010

CLINICAL DATA: Left rib pain

EXAM:
CHEST  2 VIEW

[chest pa]
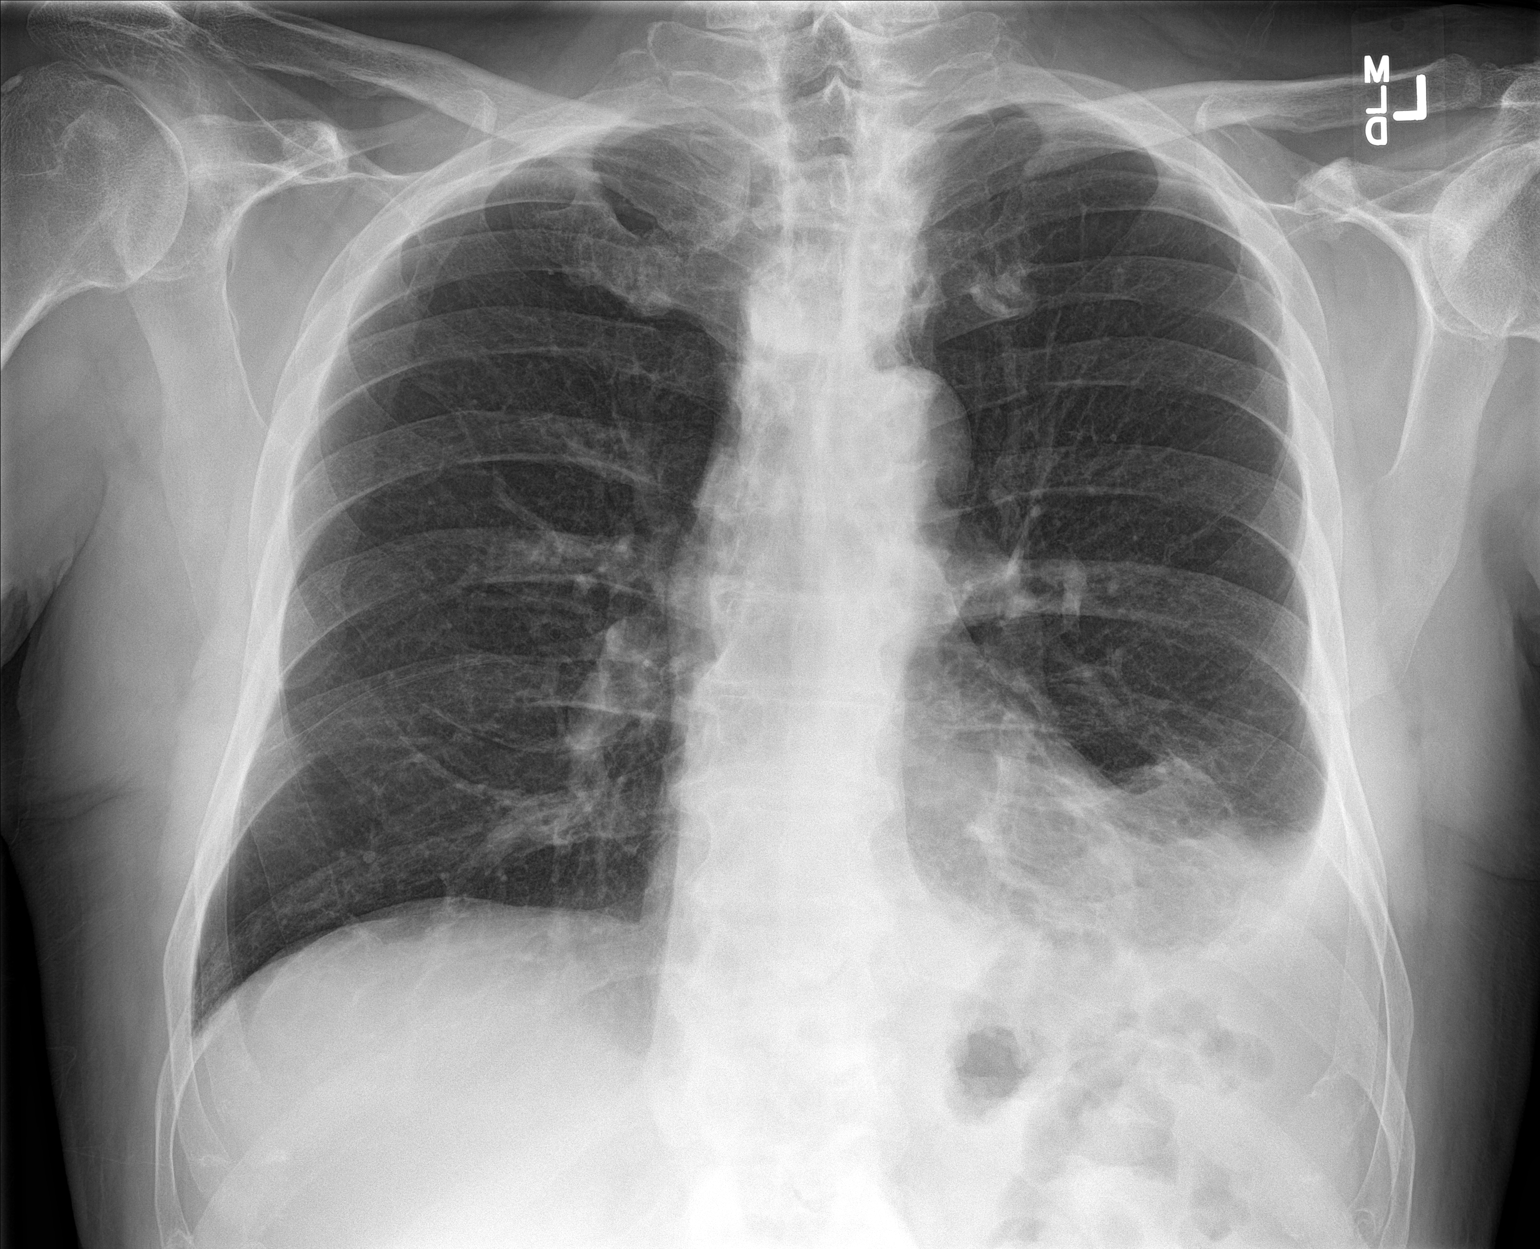

[chest lat]
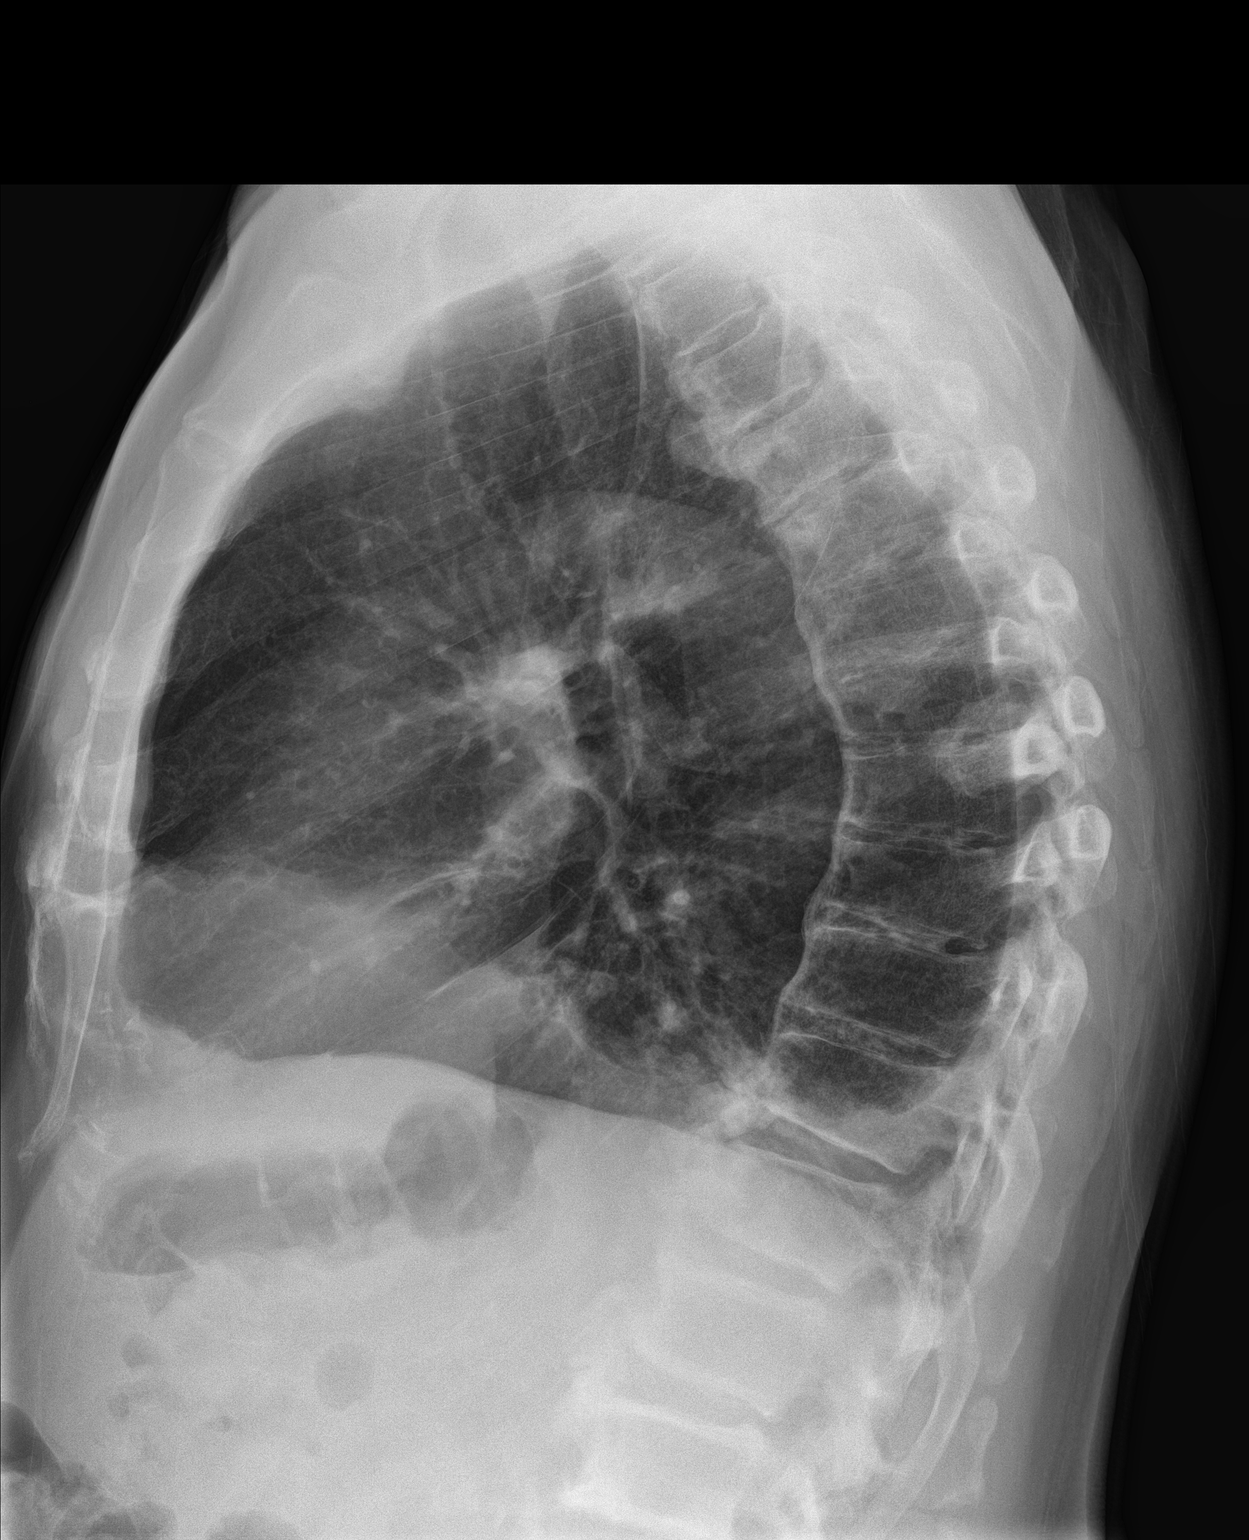

[2 of 2 positions shown; findings below may reference images not displayed]

FINDINGS: Small left pleural effusion with associated left lower lobe airspace
disease has developed since prior study. Possible pneumonia. No
underlying rib fracture or rib lesion identified.

Right lung is clear.  Negative for heart failure.
IMPRESSION: Left lower lobe infiltrate and effusion which may represent
pneumonia. Followup until clearing is recommended.

## 2016-03-05 ENCOUNTER — Other Ambulatory Visit: Payer: Self-pay | Admitting: Internal Medicine

## 2016-03-11 ENCOUNTER — Encounter: Payer: Self-pay | Admitting: Internal Medicine

## 2016-03-11 ENCOUNTER — Ambulatory Visit (INDEPENDENT_AMBULATORY_CARE_PROVIDER_SITE_OTHER): Payer: Medicare Other | Admitting: Internal Medicine

## 2016-03-11 VITALS — BP 110/66 | HR 73 | Temp 97.7°F | Resp 16 | Ht 71.0 in | Wt 187.2 lb

## 2016-03-11 DIAGNOSIS — E785 Hyperlipidemia, unspecified: Secondary | ICD-10-CM | POA: Diagnosis not present

## 2016-03-11 DIAGNOSIS — E1121 Type 2 diabetes mellitus with diabetic nephropathy: Secondary | ICD-10-CM | POA: Diagnosis not present

## 2016-03-11 DIAGNOSIS — I10 Essential (primary) hypertension: Secondary | ICD-10-CM

## 2016-03-11 DIAGNOSIS — Z79899 Other long term (current) drug therapy: Secondary | ICD-10-CM

## 2016-03-11 DIAGNOSIS — Z9861 Coronary angioplasty status: Secondary | ICD-10-CM

## 2016-03-11 DIAGNOSIS — E559 Vitamin D deficiency, unspecified: Secondary | ICD-10-CM | POA: Diagnosis not present

## 2016-03-11 DIAGNOSIS — I2510 Atherosclerotic heart disease of native coronary artery without angina pectoris: Secondary | ICD-10-CM

## 2016-03-11 LAB — BASIC METABOLIC PANEL WITH GFR
BUN: 12 mg/dL (ref 7–25)
CHLORIDE: 103 mmol/L (ref 98–110)
CO2: 27 mmol/L (ref 20–31)
Calcium: 9.2 mg/dL (ref 8.6–10.3)
Creat: 0.82 mg/dL (ref 0.70–1.18)
GFR, Est Non African American: 85 mL/min (ref >=60)
GLUCOSE: 103 mg/dL — AB (ref 65–99)
POTASSIUM: 4.2 mmol/L (ref 3.5–5.3)
Sodium: 138 mmol/L (ref 135–146)

## 2016-03-11 LAB — LIPID PANEL
Cholesterol: 173 mg/dL (ref 125–200)
HDL: 54 mg/dL (ref >=40)
LDL CALC: 101 mg/dL (ref <130)
Total CHOL/HDL Ratio: 3.2 Ratio (ref <=5.0)
Triglycerides: 92 mg/dL (ref <150)
VLDL: 18 mg/dL (ref <30)

## 2016-03-11 LAB — HEPATIC FUNCTION PANEL
ALBUMIN: 3.9 g/dL (ref 3.6–5.1)
ALK PHOS: 61 U/L (ref 40–115)
ALT: 15 U/L (ref 9–46)
AST: 15 U/L (ref 10–35)
Bilirubin, Direct: 0.1 mg/dL (ref <=0.2)
Indirect Bilirubin: 0.4 mg/dL (ref 0.2–1.2)
TOTAL PROTEIN: 6.4 g/dL (ref 6.1–8.1)
Total Bilirubin: 0.5 mg/dL (ref 0.2–1.2)

## 2016-03-11 LAB — CBC WITH DIFFERENTIAL/PLATELET
BASOS PCT: 1 %
Basophils Absolute: 82 cells/uL (ref 0–200)
Eosinophils Absolute: 328 cells/uL (ref 15–500)
Eosinophils Relative: 4 %
HEMATOCRIT: 45 % (ref 38.5–50.0)
Hemoglobin: 14.9 g/dL (ref 13.2–17.1)
LYMPHS ABS: 1558 {cells}/uL (ref 850–3900)
LYMPHS PCT: 19 %
MCH: 30.8 pg (ref 27.0–33.0)
MCHC: 33.1 g/dL (ref 32.0–36.0)
MCV: 93.2 fL (ref 80.0–100.0)
MONO ABS: 574 {cells}/uL (ref 200–950)
MPV: 10.5 fL (ref 7.5–12.5)
Monocytes Relative: 7 %
Neutro Abs: 5658 cells/uL (ref 1500–7800)
Neutrophils Relative %: 69 %
PLATELETS: 224 10*3/uL (ref 140–400)
RBC: 4.83 MIL/uL (ref 4.20–5.80)
RDW: 13.6 % (ref 11.0–15.0)
WBC: 8.2 10*3/uL (ref 3.8–10.8)

## 2016-03-11 LAB — MAGNESIUM: Magnesium: 1.9 mg/dL (ref 1.5–2.5)

## 2016-03-11 NOTE — Progress Notes (Signed)
[facility]                       [name], M.D.        [name]. [name], P.A.-C       Starlyn Skeans, P.A.-C   [facility]                [address] [place] [id number] Telephone [phone] Telefax [phone] ______________________________________________________________________     This very nice 80y.o.MWM presents for 6 month follow up with Hypertension, ASCAD, Hyperlipidemia, T2_NIDDM and Vitamin D Deficiency. Patient recently underwent back surg by Dr Vertell Limber on July 3rd for excision of as synovial cyst and is improved of his sciatica and pain , but is still on restricted activity.      Patient is treated for HTN circa 2000 & BP has been controlled at home. Patient presented with ACS in 2010 undergoing DES to the LAD and in 2011 underwent PTCA for a NSTMI and since has done well & he continues cardiology f/u with Dr Ellyn Hack.   Today's BP: 110/66. Patient has had no complaints of any cardiac type chest pain, palpitations, dyspnea/orthopnea/PND, dizziness, claudication, or dependent edema.     Hyperlipidemia is Statin Intolerant and is doing reasonably well on Ezetimibe alone with diet.. Patient denies myalgias or other med SE's. 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 His BMI id 26 and he denies symptoms of reactive hypoglycemia, diabetic polys, paresthesias or visual blurring.  Last A1c was not at goal: Lab Results  Component Value Date   HGBA1C 6.1 (H) 12/04/2015      Further, the patient also has history of Vitamin D Deficiency of 22 (2008) and supplements vitamin D without any suspected side-effects. Last  vitamin D was at goal: Lab Results  Component Value Date   VD25OH 68 08/28/2015   Current Outpatient Prescriptions on File Prior to Visit  Medication Sig  . aspirin EC 81 MG tablet Take 1 tablet (81 mg total) by mouth every other day. (Patient taking differently: Take 81 mg by mouth daily. )  . cholecalciferol (VITAMIN D) 1000 UNITS tablet Take 2,000 Units by mouth daily.   Marland Kitchen CINNAMON PO Take 1,000 mg by mouth daily.   . clopidogrel (PLAVIX) 75 MG tablet Take 75 mg by mouth daily.  Marland Kitchen doxazosin (CARDURA) 4 MG tablet TAKE 1 TABLET BY MOUTH AT BEDTIME  . finasteride (PROSCAR) 5 MG tablet Take 5 mg by mouth daily.  Marland Kitchen lisinopril (PRINIVIL,ZESTRIL) 5 MG tablet TAKE 1 TABLET BY MOUTH DAILY.  . Magnesium 500 MG CAPS Take 1 capsule by mouth daily.  . metFORMIN (GLUCOPHAGE-XR) 500 MG 24 hr tablet TAKE 2 TABLETS BY MOUTH TWICE DAILY  . oxyCODONE-acetaminophen (PERCOCET/ROXICET) 5-325 MG tablet Take 1-2 tablets by mouth every 6 (six) hours as needed for moderate pain.  Marland Kitchen ZETIA 10 MG tablet TAKE 1 TABLET BY MOUTH EVERY DAY   No current facility-administered medications on file prior to visit.    Allergies  Allergen Reactions  .  Statins Other (See Comments)    Myalgias and cramping Both Crestor and Lipitor  . Tetanus Toxoids    PMHx:   Past Medical History:  Diagnosis Date  . Arthritis   . BPH (benign prostatic hyperplasia)   . CAD S/P percutaneous coronary angioplasty 12/2009; 08/2010   a) 6/'22 - NSTEMI: PCI to LAD: Promus Element 2.5 mm x 15 mm DES; b) Cath for Angina: Prox LAD 60-70% pre-stent with FFR 0.82, 90% RVM -- Med Rx, EF 50-55%  . Essential hypertension   . Hyperlipidemia with target LDL less than 70    Statin intolerance  . Non-ST elevation MI (NSTEMI) [facility]) June 2011   PCI to LAD  . Pneumonia    DEC 2016  TX WITH ANTIBIOTIC  . Type 2 diabetes mellitus without complications ([place])    Immunization History  Administered Date(s) Administered  . Influenza, High Dose Seasonal PF  06/01/2013, 04/18/2014, 05/23/2015  . Influenza-Unspecified 05/13/2011  . Pneumococcal Conjugate-13 04/18/2014  . Pneumococcal Polysaccharide-23 07/21/2002, 05/23/2015  . Td 07/21/2000   Past Surgical History:  Procedure Laterality Date  . CARDIAC CATHETERIZATION  12/2009   Proximal LAD stenosis followed by a significant 80-90% distal stenosis  . CARDIAC CATHETERIZATION  February 2012    90% ostial RV marginal branch; 60-70% proximal LAD with widely patent distal stent. FFR 0.82; medical therapy  . CATARACT EXTRACTION W/ INTRAOCULAR LENS  IMPLANT, BILATERAL    . CORONARY ANGIOPLASTY  12/2009   PTCA to proximal LAD; PCI with Promus Element DES stent  2.5 mm x 15 mm  distalLAD - .for non-ST elevation MI  . DENTAL SURGERY    . LUMBAR LAMINECTOMY/DECOMPRESSION MICRODISCECTOMY N/A 08/09/2015   Procedure: Lumbar Four-Five, Lumbar Five- Sacral One Decompressive Lumbar Laminectomy with Resection of Synovial Cyst;  Surgeon: Erline Levine, MD;  Location: [facility];  Service: Neurosurgery;  Laterality: N/A;  L4 to S1 Decompressive Lumbar Laminectomy  . LUMBAR LAMINECTOMY/DECOMPRESSION MICRODISCECTOMY Right 01/21/2016   Procedure: Redo Right Lumbar Five-Sacral One Laminectomy for synovial cyst;  Surgeon: Erline Levine, MD;  Location: [facility];  Service: Neurosurgery;  Laterality: Right;  right  . NASAL FRACTURE SURGERY    . TRANSTHORACIC ECHOCARDIOGRAM  June 2011   - (EF not reported) Moderately dilated LV; moderate hypokinesis of anteroseptal and anterior wall consistent with MI. Grade 1 diastolic dysfunction. Mild to moderately dilated LA  . VASECTOMY     FHx:    Reviewed / unchanged  SHx:    Reviewed / unchanged  Systems Review:  Constitutional: Denies fever, chills, wt changes, headaches, insomnia, fatigue, night sweats, change in appetite. Eyes: Denies redness, blurred vision, diplopia, discharge, itchy, watery eyes.  ENT: Denies discharge, congestion, post nasal drip, epistaxis, sore  throat, earache, hearing loss, dental pain, tinnitus, vertigo, sinus pain, snoring.  CV: Denies chest pain, palpitations, irregular heartbeat, syncope, dyspnea, diaphoresis, orthopnea, PND, claudication or edema. Respiratory: denies cough, dyspnea, DOE, pleurisy, hoarseness, laryngitis, wheezing.  Gastrointestinal: Denies dysphagia, odynophagia, heartburn, reflux, water brash, abdominal pain or cramps, nausea, vomiting, bloating, diarrhea, constipation, hematemesis, melena, hematochezia  or hemorrhoids. Genitourinary: Denies dysuria, frequency, urgency, nocturia, hesitancy, discharge, hematuria or flank pain. Musculoskeletal: Denies arthralgias, myalgias, stiffness, jt. swelling, pain, limping or strain/sprain.  Skin: Denies pruritus, rash, hives, warts, acne, eczema or change in skin lesion(s). Neuro: No weakness, tremor, incoordination, spasms, paresthesia or pain. Psychiatric: Denies confusion, memory loss or sensory loss. Endo: Denies change in weight, skin or hair change.  Heme/Lymph: No excessive bleeding, bruising or enlarged lymph  nodes.  Physical Exam  BP 110/66   Pulse 73   Temp 97.7 F (36.5 C)   Resp 16   Ht 5\' 11"  (1.803 m)   Wt 187 lb 3.2 oz (84.9 kg)   SpO2 95%   BMI 26.11 kg/m   Appears well nourished and in no distress. Eyes: PERRLA, EOMs, conjunctiva no swelling or erythema. Sinuses: No frontal/maxillary tenderness ENT/Mouth: EAC's clear, TM's nl w/o erythema, bulging. Nares clear w/o erythema, swelling, exudates. Oropharynx clear without erythema or exudates. Oral hygiene is good. Tongue normal, non obstructing. Hearing intact.  Neck: Supple. Thyroid nl. Car 2+/2+ without bruits, nodes or JVD. Chest: Respirations nl with BS clear & equal w/o rales, rhonchi, wheezing or stridor.  Cor: Heart sounds normal w/ regular rate and rhythm without sig. murmurs, gallops, clicks, or rubs. Peripheral pulses normal and equal  without edema.  Abdomen: Soft & bowel sounds normal.  Non-tender w/o guarding, rebound, hernias, masses, or organomegaly.  Lymphatics: Unremarkable.  Musculoskeletal: Full ROM all peripheral extremities, joint stability, 5/5 strength, and normal gait.  Skin: Warm, dry without exposed rashes, lesions or ecchymosis apparent.  Neuro: Cranial nerves intact, reflexes equal bilaterally. Sensory-motor testing grossly intact. Tendon reflexes grossly intact.  Pysch: Alert & oriented x 3.  Insight and judgement nl & appropriate. No ideations.  Assessment and Plan:  1. Essential hypertension  - Continue medication, monitor blood pressure at home. Continue DASH diet. Reminder to go to the ER if any CP, SOB, nausea, dizziness, severe HA, changes vision/speech, left arm numbness and tingling and jaw pain.  2. Hyperlipidemia with statin intolerance  - Continue diet/meds, exercise,& lifestyle modifications. Continue monitor periodic cholesterol/liver & renal functions - Lipid panel - TSH  3. Type II diabetes mellitus with nephropathy (HCC)  - Continue diet, exercise, lifestyle modifications. Monitor appropriate labs. - Hemoglobin A1c - Insulin, random  4. Vitamin D deficiency  - Continue supplementation. - VITAMIN D 25 Hydroxy (Vit-D Deficiency, Fractures)  5. CAD S/P percutaneous coronary angioplasty and PCI- DES mid LAD in the setting of non-STEMI   6. Medication management  - CBC with Differential/Platelet - BASIC METABOLIC PANEL WITH GFR - Hepatic function panel - Magnesium     Recommended regular exercise, BP monitoring, weight control, and discussed med and SE's. Recommended labs to assess and monitor clinical status. Further disposition pending results of labs. Over 30 minutes of exam, counseling, chart review was performed

## 2016-03-11 NOTE — Patient Instructions (Signed)

## 2016-03-12 LAB — INSULIN, RANDOM: INSULIN: 21.2 u[IU]/mL — AB (ref 2.0–19.6)

## 2016-03-12 LAB — HEMOGLOBIN A1C
HEMOGLOBIN A1C: 5.6 % (ref <5.7)
MEAN PLASMA GLUCOSE: 114 mg/dL

## 2016-03-12 LAB — TSH: TSH: 1.75 m[IU]/L (ref 0.40–4.50)

## 2016-03-12 LAB — VITAMIN D 25 HYDROXY (VIT D DEFICIENCY, FRACTURES): Vit D, 25-Hydroxy: 64 ng/mL (ref 30–100)

## 2016-03-19 ENCOUNTER — Encounter: Payer: Self-pay | Admitting: Cardiology

## 2016-03-19 ENCOUNTER — Ambulatory Visit (INDEPENDENT_AMBULATORY_CARE_PROVIDER_SITE_OTHER): Payer: Medicare Other | Admitting: Cardiology

## 2016-03-19 ENCOUNTER — Other Ambulatory Visit: Payer: Self-pay | Admitting: Cardiology

## 2016-03-19 VITALS — BP 112/72 | HR 51 | Ht 70.5 in | Wt 188.8 lb

## 2016-03-19 DIAGNOSIS — Z9861 Coronary angioplasty status: Secondary | ICD-10-CM | POA: Diagnosis not present

## 2016-03-19 DIAGNOSIS — E785 Hyperlipidemia, unspecified: Secondary | ICD-10-CM | POA: Diagnosis not present

## 2016-03-19 DIAGNOSIS — E1121 Type 2 diabetes mellitus with diabetic nephropathy: Secondary | ICD-10-CM | POA: Diagnosis not present

## 2016-03-19 DIAGNOSIS — I10 Essential (primary) hypertension: Secondary | ICD-10-CM | POA: Diagnosis not present

## 2016-03-19 DIAGNOSIS — I2510 Atherosclerotic heart disease of native coronary artery without angina pectoris: Secondary | ICD-10-CM | POA: Diagnosis not present

## 2016-03-19 DIAGNOSIS — F17200 Nicotine dependence, unspecified, uncomplicated: Secondary | ICD-10-CM

## 2016-03-19 MED ORDER — FENOFIBRATE 160 MG PO TABS: 160.0000 mg | ORAL_TABLET | Freq: Every day | ORAL | 3 refills | Status: DC

## 2016-03-19 NOTE — Patient Instructions (Signed)
START FENOFIBRATE 160 MG (TRICOR)-TAKE EVERY OTHER DAY FOR ONE MONTH ,TEN INCREASE TO EVERY DAY  LABS- IN 4 MONTHS LIPID , CMP  WILL MAIL LAB SLIP    NO OTHER CHANGES AT PRESENT   Your physician wants you to follow-up in: [place] [address] [place].You will receive a reminder letter in the mail two months in advance. If you don't receive a letter, please call our office to schedule the follow-up appointment.   If you need a refill on your cardiac medications before your next appointment, please call your pharmacy.

## 2016-03-19 NOTE — Progress Notes (Signed)
PCP: Alesia Richards, MD  Clinic Note: Chief Complaint  Patient presents with  . Follow-up    CAD-PCI / prior NSTEMI    HPI: David [name] is a 80 y.o. male with a PMH below who presents today for 6 month f/u of CAD-PCI & CRFs. 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 He had a relook catheterization in February 2012 identified a moderate lesion proximal to the LAD stent. This was evaluated with an FFR that was 0.82. The plan was for medical management and he has done well since. He is a former patient Dr. Aldona Bar.  Gaylen was last seen in July 2016 & was doing well with no active CAD complaints.  No angina or CHF   Recent Hospitalizations: n/a Jan & July - NSGx - spinal cyst (Dr. Vertell Limber) - out of shape b/c slow recovery with minimal activity post-op  Studies Reviewed: n/a   Interval History: [name] is doing quite well today. He states over last 6-7 months he's been on a deal with on and off again back surgery issues. He's had back cysts that have recurred and had to be removed. At the really kept him for me to do the activity level and he wants to do. As result, he just doesn't have the same level of "get up ago he used to have, but does not have any particular focal complaints. He still able to lift weights and a long, just has to stop more frequently and is unable to go as long as he used to.  He remains active, just not doing much to use to. No anginal or heart-type symptoms.  No chest pain or shortness of breath with rest or exertion.  No PND, orthopnea or edema. No palpitations, lightheadedness, dizziness, weakness or syncope/near syncope. No TIA/amaurosis fugax symptoms.   Past Medical History:  Diagnosis Date  . Arthritis   . BPH (benign prostatic hyperplasia)   . CAD S/P percutaneous coronary angioplasty 12/2009; 08/2010   a) 6/'22 - NSTEMI: PCI to LAD: Promus Element 2.5 mm x 15 mm  DES; b) Cath for Angina: Prox LAD 60-70% pre-stent with FFR 0.82, 90% RVM -- Med Rx, EF 50-55%  . Essential hypertension   . Hyperlipidemia with target LDL less than 70    Statin intolerance  . Non-ST elevation MI (NSTEMI) [facility]) June 2011   PCI to LAD  . Pneumonia    DEC 2016  [place] WITH ANTIBIOTIC  . Type 2 diabetes mellitus without complications [facility])     Past Surgical History:  Procedure Laterality Date  . CARDIAC CATHETERIZATION  12/2009   Proximal LAD stenosis followed by a significant 80-90% distal stenosis  . CARDIAC CATHETERIZATION  February 2012    90% ostial RV marginal branch; 60-70% proximal LAD with widely patent distal stent. FFR 0.82; medical therapy  . CATARACT EXTRACTION W/ INTRAOCULAR LENS  IMPLANT, BILATERAL    . CORONARY ANGIOPLASTY  12/2009   PTCA to proximal LAD; PCI with Promus Element DES stent  2.5 mm x 15 mm  distalLAD - .for non-ST elevation MI  . DENTAL SURGERY    . LUMBAR LAMINECTOMY/DECOMPRESSION MICRODISCECTOMY N/A 08/09/2015   Procedure: Lumbar Four-Five, Lumbar Five- Sacral One Decompressive Lumbar Laminectomy with Resection of Synovial Cyst;  Surgeon: Erline Levine, MD;  Location: [place] [facility] ORS;  Service: Neurosurgery;  Laterality: N/A;  L4 to S1 Decompressive Lumbar Laminectomy  . LUMBAR LAMINECTOMY/DECOMPRESSION  MICRODISCECTOMY Right 01/21/2016   Procedure: Redo Right Lumbar Five-Sacral One Laminectomy for synovial cyst;  Surgeon: Erline Levine, MD;  Location: [place] [facility]UR[facility] OR[facility];  Service: Neurosurgery;  Laterality: Right;  right  . NASAL FRACTURE SURGERY    . TRANSTHORACIC ECHOCARDIOGRAM  June 2011   - (EF not reported) Moderately dilated LV; moderate hypokinesis of anteroseptal and anterior wall consistent with MI. Grade 1 diastolic dysfunction. Mild to moderately dilated LA  . VASECTOMY      ROS: A comprehensive was performed. Review of Systems  Constitutional: Negative for malaise/fatigue (Just decrease in overall energy levels.).  HENT: Negative for  nosebleeds.   Eyes: Negative for blurred vision.  Respiratory: Negative for cough, shortness of breath and wheezing.   Cardiovascular: Negative for claudication.  Gastrointestinal: Negative for blood in stool and melena.  Genitourinary: Negative for hematuria.  Musculoskeletal: Positive for back pain (With some radiculopathy and neuropathy.) and joint pain. Negative for falls and myalgias.  Neurological: Negative for dizziness, loss of consciousness and headaches.  Endo/Heme/Allergies: Bruises/bleeds easily (But not bad ).  Psychiatric/Behavioral: Negative for depression and memory loss. The patient does not have insomnia.   All other systems reviewed and are negative.   Prior to Admission medications   Medication Sig Start Date End Date Taking? Authorizing Provider  aspirin EC 81 MG tablet Take 1 tablet (81 mg total) by mouth every other day. Patient taking differently: Take 81 mg by mouth daily.  02/13/14  Yes Leonie Man, MD  cholecalciferol (VITAMIN D) 1000 UNITS tablet Take 2,000 Units by mouth daily.    Yes Historical Provider, MD  CINNAMON PO Take 1,000 mg by mouth daily.    Yes Historical Provider, MD  clopidogrel (PLAVIX) 75 MG tablet Take 75 mg by mouth daily.   Yes Historical Provider, MD  doxazosin (CARDURA) 4 MG tablet TAKE 1 TABLET BY MOUTH AT BEDTIME 03/05/16  Yes [name], MD  finasteride (PROSCAR) 5 MG tablet Take 5 mg by mouth daily.   Yes Historical Provider, MD  lisinopril (PRINIVIL,ZESTRIL) 5 MG tablet TAKE 1 TABLET BY MOUTH DAILY. 04/02/15  Yes Leonie Man, MD  Magnesium 500 MG CAPS Take 1 capsule by mouth daily.   Yes Historical Provider, MD  metFORMIN (GLUCOPHAGE-XR) 500 MG 24 hr tablet TAKE 2 TABLETS BY MOUTH TWICE DAILY 12/04/15  Yes [name], MD  ZETIA 10 MG tablet TAKE 1 TABLET BY MOUTH EVERY DAY 11/02/15  Yes Vicie Mutters, PA-C    Allergies  Allergen Reactions  . Statins Other (See Comments)    Myalgias and cramping Both Crestor and  Lipitor  . Tetanus Toxoids     Social History   Social History  . Marital status: Married    Spouse name: N/A  . Number of children: N/A  . Years of education: N/A   Occupational History  . Not on file.   Social History Main Topics  . Smoking status: Current Every Day Smoker    Years: 60.00    Types: Pipe  . Smokeless tobacco: Never Used     Comment: smokes pipe qd  . Alcohol use 4.2 oz/week    7 Standard drinks or equivalent per week     Comment: drinks 4 ounces of vodka daily  . Drug use: No  . Sexual activity: Not on file   Other Topics Concern  . Not on file   Social History Narrative   He is a married father of 2 with no grandchildren as of yet. He  continued to smoke a pipe several times the course of day. He does have an occasional alcoholic beverage. While not involved a standard exercise routine, he is very active with walking and working in the yard, splitting wood and doing aggressive yard work including Production designer, theatre/television/film.   Family History  Problem Relation Age of Onset  . Cancer Father     lung  . Stroke Father   . Cancer Sister     breast  . Cancer Brother     lung  . Cancer Son     history of prostate    Wt Readings from Last 3 Encounters:  03/19/16 188 lb 12.8 oz (85.6 kg)  03/11/16 187 lb 3.2 oz (84.9 kg)  01/21/16 185 lb 13.6 oz (84.3 kg)   PAD Screen 03/21/2016  Previous PAD dx? No  Previous surgical procedure? No  Pain with walking? Yes  Subsides with rest? Yes  Feet/toe relief with dangling? No  Painful, non-healing ulcers? No  Extremities discolored? No    PHYSICAL EXAM BP 112/72   Pulse (!) 51   Ht 5' 10.5" (1.791 m)   Wt 188 lb 12.8 oz (85.6 kg)   BMI 26.71 kg/m  General appearance: A&O, cooperative, appears stated age, no distress and healthy-appearing, well-nourished and well-groomed. Answers questions appropriately. HEENT: Benjamin Perez/AT, EOMI, MMM, anicteric sclera Neck: no adenopathy, no carotid bruit, no JVD,  supple, symmetrical, trachea midline and thyroid not enlarged, symmetric, no tenderness/mass/nodules Lungs: CTAB, normal percussion bilaterally and nonlabored, good air movement. No W./R../R. Heart: RRR, S1, S2 normal, no murmur, click, rub or gallop and normal apical impulse Abdomen: soft, non-tender; bowel sounds normal; no masses, no organomegaly Extremities: extremities normal, atraumatic, no cyanosis or edema; no significant rash  Pulses: 2+ and symmetric Neurologic: normal strength and tone. Normal symmetric reflexes. Gait is somewhat slower than what he had last time I saw him.   Adult ECG Report  Rate: 51;  Rhythm: normal sinus rhythm, sinus arrhythmia and Otherwise normal EKG  Narrative Interpretation: otherwise normal EKG with no changes. Normal intervals, durations and voltage. there is poor R wave progression in the precordial leads there is also stable. -- Normal axis is replaced left axis deviation/LAFB. Has the appearance of potential limb lead reversal.   Other studies Reviewed: Additional studies/ records that were reviewed today include:  Recent Labs:   Lab Results  Component Value Date   CHOL 173 03/11/2016   HDL 54 03/11/2016   LDLCALC 101 03/11/2016   TRIG 92 03/11/2016   CHOLHDL 3.2 03/11/2016   Lab Results  Component Value Date   HGBA1C 5.6 03/11/2016   Lab Results  Component Value Date   CREATININE 0.82 03/11/2016    ASSESSMENT / PLAN: Problem List Items Addressed This Visit    Type II diabetes mellitus with nephropathy ([facility]) (Chronic)    On metformin monitored by PCP. Remains on lisinopril for renal protection.      Tobacco dependency (Chronic)    Not interested in stopping smoking his pipe at this time      Hyperlipidemia with statin intolerance - Primary (Chronic)    LDL was not at goal by recent check. Better than it was, but still not as good. Is on Zetia. I'm going to add fenofibrate. If this does not help, will refer to lipid clinic for  follow-up. Recheck labs in 4 months.      Relevant Medications   fenofibrate 160 MG tablet   Other Relevant Orders  EKG 12-Lead (Completed)   Lipid panel   Comprehensive metabolic panel   Essential hypertension (Chronic)    Well-controlled on ACE inhibitor.      Relevant Medications   fenofibrate 160 MG tablet   Other Relevant Orders   EKG 12-Lead (Completed)   Lipid panel   Comprehensive metabolic panel   CAD S/P percutaneous coronary angioplasty and PCI- DES mid LAD in the setting of non-STEMI (Chronic)    Stable without any active anginal heart failure symptoms. Taking aspirin every other day now has helped his bleeding bruising. I did indicate that he could potentially stop it altogether, but his PCP would like for him to stay on it. Therefore he is on aspirin plus Plavix. He is on ACE inhibitor but not on beta blocker due to fatigue and baseline conduction abnormality. Non-statin due to intolerance.      Relevant Medications   fenofibrate 160 MG tablet   Other Relevant Orders   EKG 12-Lead (Completed)   Lipid panel   Comprehensive metabolic panel    Other Visit Diagnoses   None.     Current medicines are reviewed at length with the patient today. (+/- concerns) n/a The following changes have been made:  START FENOFIBRATE 160 MG (TRICOR)-TAKE EVERY OTHER DAY FOR ONE MONTH ,TEN INCREASE TO EVERY DAY  LABS- IN 4 MONTHS LIPID , CMP  WILL MAIL LAB SLIP    NO OTHER CHANGES AT PRESENT   Your physician wants you to follow-up in: 41 MONTHS WITH DR Ellyn Hack   Other studies ordered:   Orders Placed This Encounter  Procedures  . Lipid panel  . Comprehensive metabolic panel  . EKG 12-Lead    no change in medications. One-year followup    Glenetta Hew, M.D., M.S. Interventional Cardiologist   Pager # [phone]

## 2016-03-21 ENCOUNTER — Encounter: Payer: Self-pay | Admitting: Cardiology

## 2016-03-21 NOTE — Assessment & Plan Note (Signed)
Stable without any active anginal heart failure symptoms. Taking aspirin every other day now has helped his bleeding bruising. I did indicate that he could potentially stop it altogether, but his PCP would like for him to stay on it. Therefore he is on aspirin plus Plavix. He is on ACE inhibitor but not on beta blocker due to fatigue and baseline conduction abnormality. Non-statin due to intolerance.

## 2016-03-21 NOTE — Assessment & Plan Note (Signed)
On metformin monitored by PCP. Remains on lisinopril for renal protection.

## 2016-03-21 NOTE — Assessment & Plan Note (Signed)
Well-controlled on ACE inhibitor. 

## 2016-03-21 NOTE — Assessment & Plan Note (Signed)
LDL was not at goal by recent check. Better than it was, but still not as good. Is on Zetia. I'm going to add fenofibrate. If this does not help, will refer to lipid clinic for follow-up. Recheck labs in 4 months.

## 2016-03-21 NOTE — Assessment & Plan Note (Signed)
Not interested in stopping smoking his pipe at this time

## 2016-04-04 ENCOUNTER — Other Ambulatory Visit: Payer: Self-pay | Admitting: Cardiology

## 2016-04-22 IMAGING — CT CT CHEST W/O CM
3 of 4 series · 14 of 36 positions shown, 15 images · non-contrast
Comparison: Multiple exams, including 07/27/2015

CLINICAL DATA: Follow up pneumonia. Persistent lingular infiltrate.
The patient is a 60+ year pipe smoker.

EXAM:
CT CHEST WITHOUT CONTRAST
TECHNIQUE: Multidetector CT imaging of the chest was performed following the
standard protocol without IV contrast.

[Series 2: chest w/(date) · axial · 0.68mm/px · z∈[+960,+1110]mm · 3 of 62 slices shown, 4 images]
[im 16/62  mediastinal]
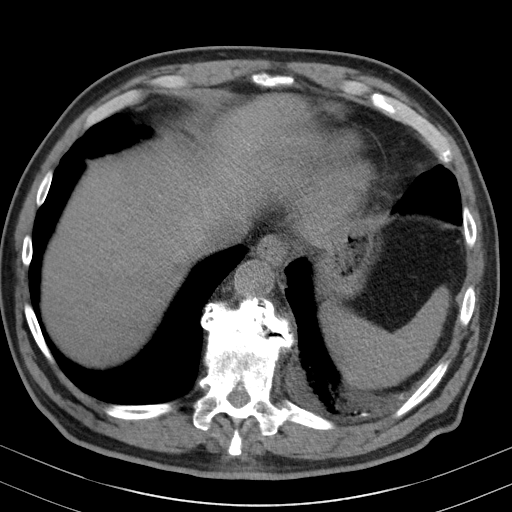
[im 16/62  lung]
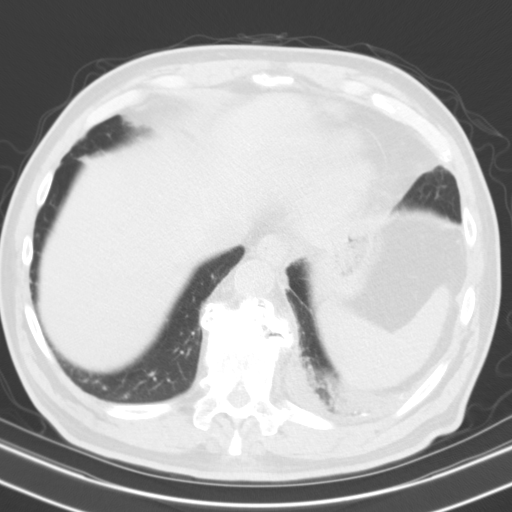
[im 31/62  lung]
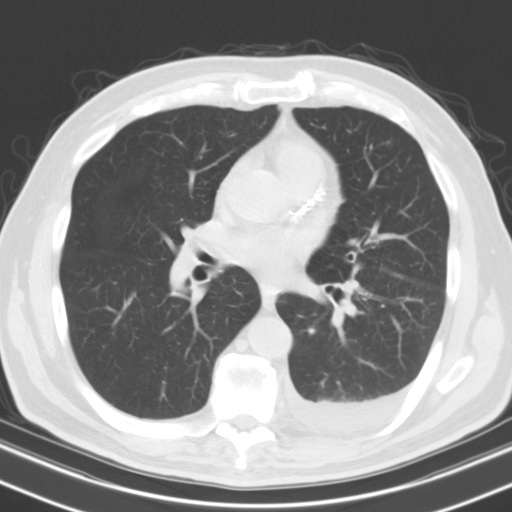
[im 46/62  lung]
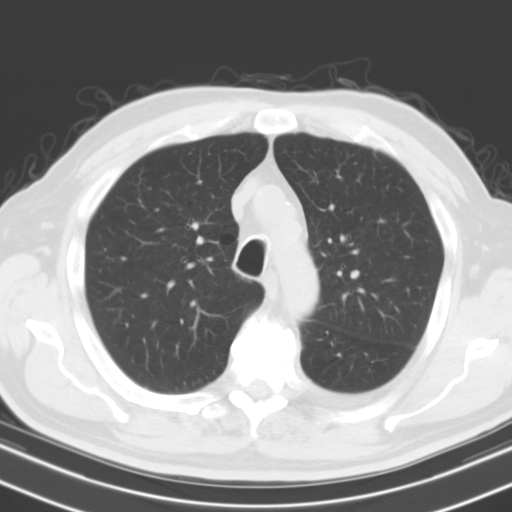

[Series 3: cor · coronal · 0.60mm/px · 3 of 102 slices shown]
[im 21/102  lung]
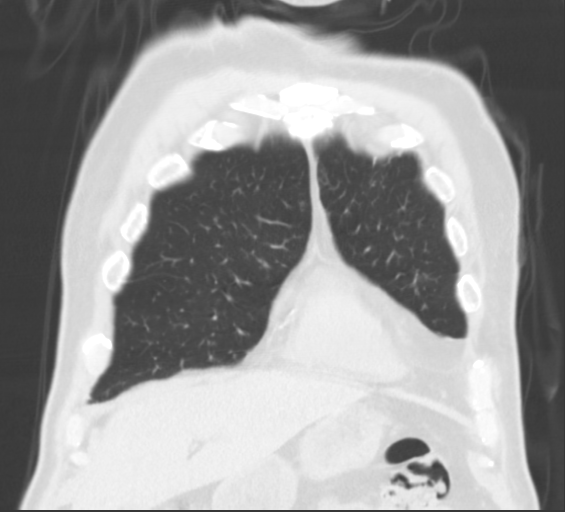
[im 41/102  lung]
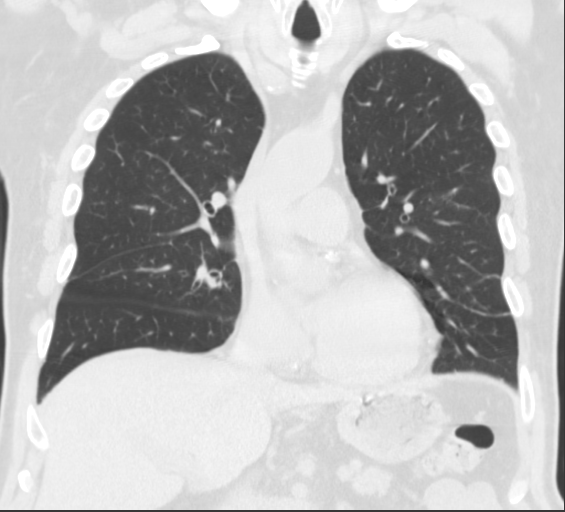
[im 61/102  lung]
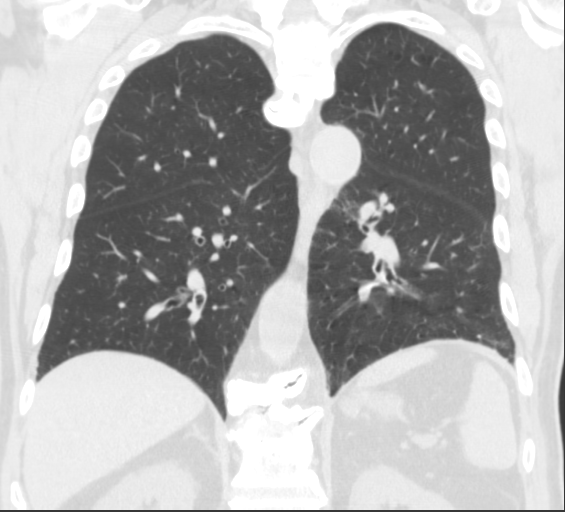

[Series 6: super d · axial · 0.68mm/px · z∈[+944,+1165]mm · 8 of 271 slices shown]
[im 25/271  lung]
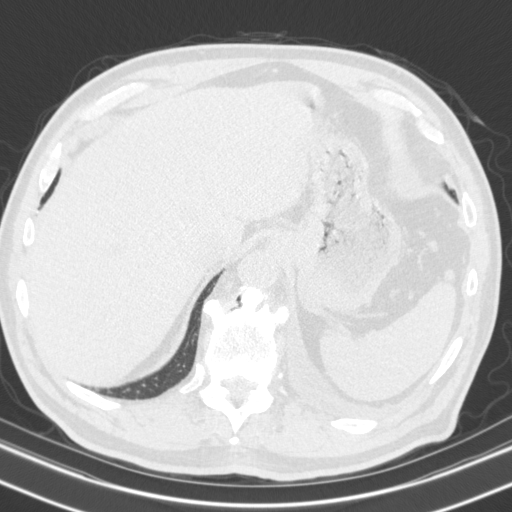
[im 50/271  lung]
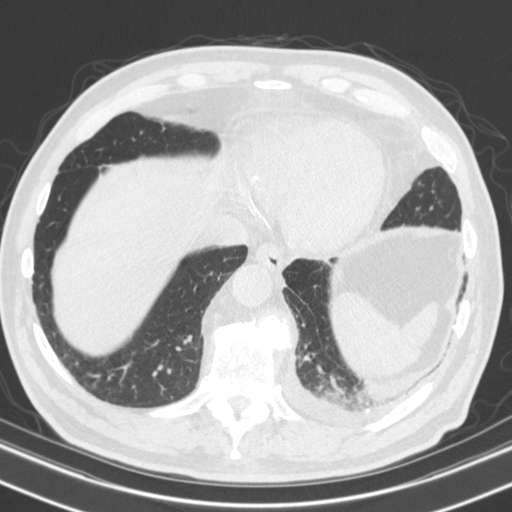
[im 86/271  lung]
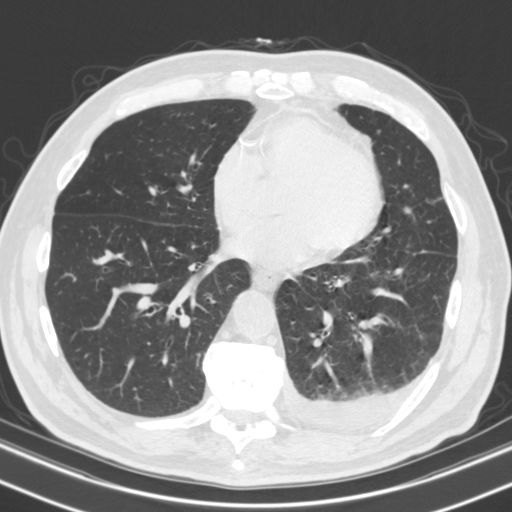
[im 123/271  lung]
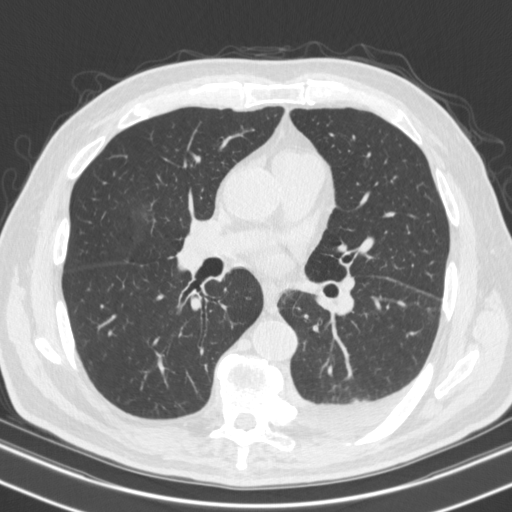
[im 148/271  lung]
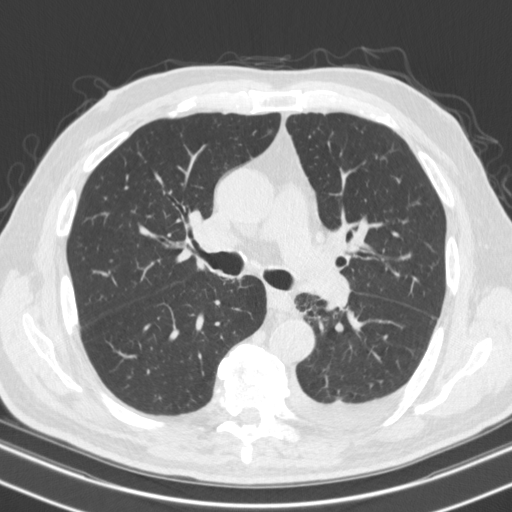
[im 185/271  lung]
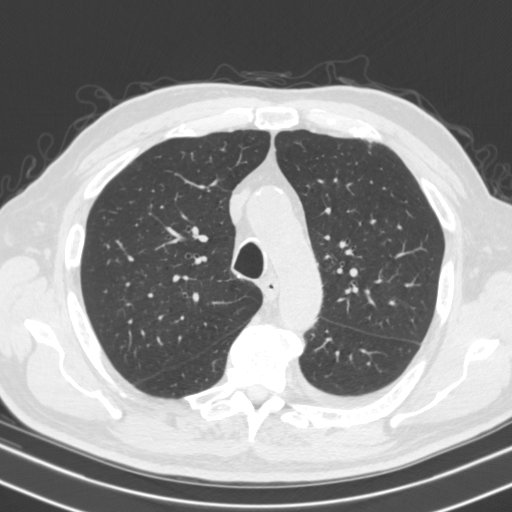
[im 221/271  lung]
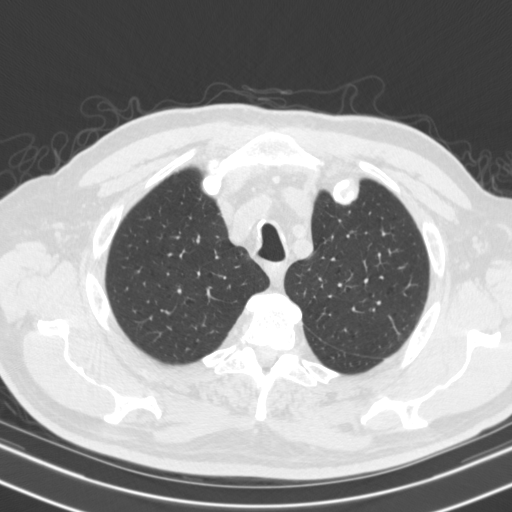
[im 246/271  lung]
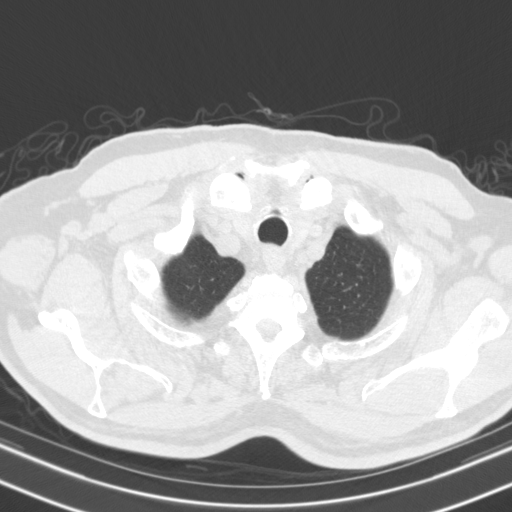

[14 of 36 positions shown; findings below may reference images not displayed]

FINDINGS: Mediastinum/Nodes: Coronary, aortic arch, and branch vessel
atherosclerotic vascular disease. No pathologic thoracic adenopathy
identified.

Lungs/Pleura: Centrilobular emphysema.

Small left pleural effusion, nonspecific for transudative versus
exudative etiology. There is some faint speckled calcifications
either along the visceral pleural margin or in the peripheral
atelectatic lung along the posterior inferior margin of the left
lower lobe, as shown on images 45 through 48 of series 2. I do not
see definite calcified lymph nodes in the mediastinum to suggest
other findings of old granulomatous disease. I do not see a specific
mass.

Mild central airway thickening bilaterally.

Upper abdomen: Punctate calcifications in the pancreatic parenchyma
are compatible chronic calcific pancreatitis. No overt
peripancreatic stranding along the visualized part of the pancreas.

Musculoskeletal: Considerable thoracic spondylosis with bridging
interbody acquired fusion.
IMPRESSION: 1. I do not see a specific mass or focus of malignancy. The there is
a small left pleural effusion with adjacent passive atelectasis.
There is some punctate tiny calcifications along the atelectatic
lung margin in the posterior basal segment left lower lobe, which
could be from old granulomatous disease but which is technically
nonspecific. The effusion is nonspecific for transudative versus
exudative etiology.
2. Centrilobular emphysema.
3. Chronic calcific pancreatitis.
4. Considerable thoracic spondylosis with spurring both anterior to
the vertebral body column bridging between the vertebral bodies, and
also central interbody bony fusion at various mid thoracic levels.
5. Coronary, aortic arch, and branch vessel atherosclerotic vascular
disease.
6. Mild central airway thickening bilaterally.

## 2016-04-29 DIAGNOSIS — R351 Nocturia: Secondary | ICD-10-CM | POA: Diagnosis not present

## 2016-04-29 DIAGNOSIS — N401 Enlarged prostate with lower urinary tract symptoms: Secondary | ICD-10-CM | POA: Diagnosis not present

## 2016-04-29 DIAGNOSIS — R972 Elevated prostate specific antigen [PSA]: Secondary | ICD-10-CM | POA: Diagnosis not present

## 2016-04-29 DIAGNOSIS — N5201 Erectile dysfunction due to arterial insufficiency: Secondary | ICD-10-CM | POA: Diagnosis not present

## 2016-05-06 DIAGNOSIS — M5416 Radiculopathy, lumbar region: Secondary | ICD-10-CM | POA: Diagnosis not present

## 2016-05-07 ENCOUNTER — Other Ambulatory Visit: Payer: Self-pay | Admitting: Physician Assistant

## 2016-05-08 DIAGNOSIS — M5416 Radiculopathy, lumbar region: Secondary | ICD-10-CM | POA: Diagnosis not present

## 2016-05-13 DIAGNOSIS — M5416 Radiculopathy, lumbar region: Secondary | ICD-10-CM | POA: Diagnosis not present

## 2016-05-15 DIAGNOSIS — M5416 Radiculopathy, lumbar region: Secondary | ICD-10-CM | POA: Diagnosis not present

## 2016-05-20 ENCOUNTER — Ambulatory Visit (INDEPENDENT_AMBULATORY_CARE_PROVIDER_SITE_OTHER): Payer: Medicare Other | Admitting: *Deleted

## 2016-05-20 DIAGNOSIS — Z23 Encounter for immunization: Secondary | ICD-10-CM | POA: Diagnosis not present

## 2016-05-20 DIAGNOSIS — M5416 Radiculopathy, lumbar region: Secondary | ICD-10-CM | POA: Diagnosis not present

## 2016-05-22 DIAGNOSIS — M5416 Radiculopathy, lumbar region: Secondary | ICD-10-CM | POA: Diagnosis not present

## 2016-05-27 DIAGNOSIS — M5416 Radiculopathy, lumbar region: Secondary | ICD-10-CM | POA: Diagnosis not present

## 2016-05-29 DIAGNOSIS — M5416 Radiculopathy, lumbar region: Secondary | ICD-10-CM | POA: Diagnosis not present

## 2016-06-03 DIAGNOSIS — M5416 Radiculopathy, lumbar region: Secondary | ICD-10-CM | POA: Diagnosis not present

## 2016-06-05 DIAGNOSIS — M5416 Radiculopathy, lumbar region: Secondary | ICD-10-CM | POA: Diagnosis not present

## 2016-06-09 DIAGNOSIS — M5416 Radiculopathy, lumbar region: Secondary | ICD-10-CM | POA: Diagnosis not present

## 2016-06-11 ENCOUNTER — Telehealth: Payer: Self-pay | Admitting: *Deleted

## 2016-06-11 DIAGNOSIS — E785 Hyperlipidemia, unspecified: Secondary | ICD-10-CM

## 2016-06-11 DIAGNOSIS — I10 Essential (primary) hypertension: Secondary | ICD-10-CM

## 2016-06-11 DIAGNOSIS — I2510 Atherosclerotic heart disease of native coronary artery without angina pectoris: Secondary | ICD-10-CM

## 2016-06-11 DIAGNOSIS — Z9861 Coronary angioplasty status: Secondary | ICD-10-CM

## 2016-06-11 NOTE — Telephone Encounter (Signed)
-----   Message from Raiford Simmonds, RN sent at 03/19/2016 11:58 AM EDT ----- LIPID CMP IN DEC 2017 MAIL IN NOV 2017

## 2016-06-11 NOTE — Telephone Encounter (Signed)
Mail letter and labslip  

## 2016-06-17 ENCOUNTER — Ambulatory Visit (INDEPENDENT_AMBULATORY_CARE_PROVIDER_SITE_OTHER): Payer: Medicare Other | Admitting: Physician Assistant

## 2016-06-17 ENCOUNTER — Encounter: Payer: Self-pay | Admitting: Physician Assistant

## 2016-06-17 VITALS — BP 132/68 | HR 86 | Resp 14 | Ht 70.0 in | Wt 193.0 lb

## 2016-06-17 DIAGNOSIS — F17200 Nicotine dependence, unspecified, uncomplicated: Secondary | ICD-10-CM | POA: Diagnosis not present

## 2016-06-17 DIAGNOSIS — I10 Essential (primary) hypertension: Secondary | ICD-10-CM

## 2016-06-17 DIAGNOSIS — Z79899 Other long term (current) drug therapy: Secondary | ICD-10-CM | POA: Diagnosis not present

## 2016-06-17 DIAGNOSIS — F329 Major depressive disorder, single episode, unspecified: Secondary | ICD-10-CM

## 2016-06-17 DIAGNOSIS — E1121 Type 2 diabetes mellitus with diabetic nephropathy: Secondary | ICD-10-CM

## 2016-06-17 DIAGNOSIS — E785 Hyperlipidemia, unspecified: Secondary | ICD-10-CM

## 2016-06-17 DIAGNOSIS — M5416 Radiculopathy, lumbar region: Secondary | ICD-10-CM | POA: Diagnosis not present

## 2016-06-17 LAB — BASIC METABOLIC PANEL WITH GFR
BUN: 13 mg/dL (ref 7–25)
CALCIUM: 9.3 mg/dL (ref 8.6–10.3)
CO2: 28 mmol/L (ref 20–31)
CREATININE: 0.99 mg/dL (ref 0.70–1.18)
Chloride: 104 mmol/L (ref 98–110)
GFR, Est African American: 84 mL/min (ref >=60)
GFR, Est Non African American: 73 mL/min (ref >=60)
Glucose, Bld: 85 mg/dL (ref 65–99)
Potassium: 4.5 mmol/L (ref 3.5–5.3)
SODIUM: 140 mmol/L (ref 135–146)

## 2016-06-17 LAB — CBC WITH DIFFERENTIAL/PLATELET
BASOS ABS: 0 {cells}/uL (ref 0–200)
Basophils Relative: 0 %
EOS PCT: 5 %
Eosinophils Absolute: 360 cells/uL (ref 15–500)
HCT: 44.3 % (ref 38.5–50.0)
HEMOGLOBIN: 14.9 g/dL (ref 13.2–17.1)
Lymphocytes Relative: 24 %
Lymphs Abs: 1728 cells/uL (ref 850–3900)
MCH: 31.6 pg (ref 27.0–33.0)
MCHC: 33.6 g/dL (ref 32.0–36.0)
MCV: 94.1 fL (ref 80.0–100.0)
MONOS PCT: 12 %
MPV: 10.3 fL (ref 7.5–12.5)
Monocytes Absolute: 864 cells/uL (ref 200–950)
NEUTROS PCT: 59 %
Neutro Abs: 4248 cells/uL (ref 1500–7800)
PLATELETS: 213 10*3/uL (ref 140–400)
RBC: 4.71 MIL/uL (ref 4.20–5.80)
RDW: 12.9 % (ref 11.0–15.0)
WBC: 7.2 10*3/uL (ref 3.8–10.8)

## 2016-06-17 LAB — LIPID PANEL
Cholesterol: 158 mg/dL (ref <200)
HDL: 53 mg/dL (ref >40)
LDL Cholesterol: 90 mg/dL (ref <100)
TRIGLYCERIDES: 77 mg/dL (ref <150)
Total CHOL/HDL Ratio: 3 Ratio (ref <5.0)
VLDL: 15 mg/dL (ref <30)

## 2016-06-17 LAB — HEPATIC FUNCTION PANEL
ALBUMIN: 4.1 g/dL (ref 3.6–5.1)
ALT: 14 U/L (ref 9–46)
AST: 19 U/L (ref 10–35)
Alkaline Phosphatase: 54 U/L (ref 40–115)
BILIRUBIN DIRECT: 0.2 mg/dL (ref <=0.2)
Indirect Bilirubin: 0.4 mg/dL (ref 0.2–1.2)
Total Bilirubin: 0.6 mg/dL (ref 0.2–1.2)
Total Protein: 6.4 g/dL (ref 6.1–8.1)

## 2016-06-17 LAB — TSH: TSH: 2.29 m[IU]/L (ref 0.40–4.50)

## 2016-06-17 LAB — MAGNESIUM: MAGNESIUM: 2.2 mg/dL (ref 1.5–2.5)

## 2016-06-17 NOTE — Progress Notes (Signed)
Patient ID: David [name], male   DOB: January 07, 1936, 80 y.o.   MRN: [id number]  Assessment and Plan:  Hypertension:  -will contact Dr. Ellyn Hack about stopping lisinopril given low BP -Continue medication -monitor blood pressure at home. -Continue DASH diet -Reminder to go to the ER if any CP, SOB, nausea, dizziness, severe HA, changes vision/speech, left arm numbness and tingling and jaw pain.  Cholesterol -continue medications, check lipids, decrease fatty foods, increase activity.  - Continue diet and exercise -Check cholesterol.   Diabetes with diabetic polyneuropathy  Discussed general issues about diabetes pathophysiology and management., Educational material distributed., Suggested low cholesterol diet., Encouraged aerobic exercise., Discussed foot care., Reminded to get yearly retinal exam. -Continue diet and exercise.  -Check A1C  Tobacco dependency -  instruction/counseling given, counseled patient on the dangers of tobacco use, advised patient to stop smoking, and reviewed strategies to maximize success, patient not ready to quit at this time.   Medication management -     Magnesium  Reactive depression Admits to some depression s/p back surgery, declines medications at this time, continue PT, will call if would like a medication    Continue diet and meds as discussed. Further disposition pending results of labs. Discussed med's effects and SE's.  Future Appointments Date Time Provider [place]  09/24/2016 10:00 AM [name], MD [id number] None      HPI 80 y.o. male  presents for 3 month follow up with hypertension, hyperlipidemia, diabetes and vitamin D deficiency.   His blood pressure has been controlled at home, today their BP is BP: 132/68.He does workout. He denies chest pain, shortness of breath, dizziness.  He has a history of ASHD S/P Stenting. He is on ASA and Plavix.  Denies chest pain, dyspnea and exertional chest pressure/discomfort.    Patient is 5 months s/p redo lumbar laminectomy, still doing therapy, states that he has weakness in his left leg and seeing PT 2 x a week. Does not take any pain meds. Admits to depression, states that unable to do as much due to the weakness, declines meds at this time.    He is on cholesterol medication and denies myalgias. His cholesterol is at goal. The cholesterol was:  03/11/2016: Cholesterol 173; HDL 54; LDL Cholesterol 101; Triglycerides 92   He has been working on diet and exercise for diabetes without complications, he is on the metformin, he is on bASA, he is on ACE/ARB, and denies  foot ulcerations, hyperglycemia, hypoglycemia , increased appetite, nausea, paresthesia of the feet, polydipsia, polyuria, visual disturbances, vomiting and weight loss. Last A1C was: 03/11/2016: Hgb A1c MFr Bld 5.6   Patient is on Vitamin D supplement. 03/11/2016: Vit D, 25-Hydroxy 64  BMI is Body mass index is 27.69 kg/m., he is working on diet and exercise. Wt Readings from Last 3 Encounters:  06/17/16 193 lb (87.5 kg)  03/19/16 188 lb 12.8 oz (85.6 kg)  03/11/16 187 lb 3.2 oz (84.9 kg)     Current Medications:  Current Outpatient Prescriptions on File Prior to Visit  Medication Sig Dispense Refill  . aspirin EC 81 MG tablet Take 1 tablet (81 mg total) by mouth every other day. (Patient taking differently: Take 81 mg by mouth daily. ) 30 tablet 11  . cholecalciferol (VITAMIN D) 1000 UNITS tablet Take 2,000 Units by mouth daily.     Marland Kitchen CINNAMON PO Take 1,000 mg by mouth daily.     . clopidogrel (PLAVIX) 75 MG tablet TAKE 1 TABLET BY MOUTH  EVERY DAY 90 tablet 3  . doxazosin (CARDURA) 4 MG tablet TAKE 1 TABLET BY MOUTH AT BEDTIME 90 tablet 1  . ezetimibe (ZETIA) 10 MG tablet TAKE 1 TABLET BY MOUTH EVERY DAY 90 tablet 1  . fenofibrate 160 MG tablet Take 1 tablet (160 mg total) by mouth daily. 90 tablet 3  . finasteride (PROSCAR) 5 MG tablet Take 5 mg by mouth daily.    Marland Kitchen lisinopril  (PRINIVIL,ZESTRIL) 5 MG tablet TAKE 1 TABLET BY MOUTH DAILY. 90 tablet 3  . Magnesium 500 MG CAPS Take 1 capsule by mouth daily.    . metFORMIN (GLUCOPHAGE-XR) 500 MG 24 hr tablet TAKE 2 TABLETS BY MOUTH TWICE DAILY 360 tablet 1   No current facility-administered medications on file prior to visit.    Medical History:  Past Medical History:  Diagnosis Date  . Arthritis   . BPH (benign prostatic hyperplasia)   . CAD S/P percutaneous coronary angioplasty 12/2009; 08/2010   a) 6/'22 - NSTEMI: PCI to LAD: Promus Element 2.5 mm x 15 mm DES; b) Cath for Angina: Prox LAD 60-70% pre-stent with FFR 0.82, 90% RVM -- Med Rx, EF 50-55%  . Essential hypertension   . Hyperlipidemia with target LDL less than 70    Statin intolerance  . Non-ST elevation MI (NSTEMI) [facility]) June 2011   PCI to LAD  . Pneumonia    DEC 2016  [place] WITH ANTIBIOTIC  . Type 2 diabetes mellitus without complications ([facility])    Allergies:  Allergies  Allergen Reactions  . Statins Other (See Comments)    Myalgias and cramping Both Crestor and Lipitor  . Tetanus Toxoids      Review of Systems:  Review of Systems  Constitutional: Negative for chills, fever and malaise/fatigue.  HENT: Negative for congestion, ear pain and sore throat.   Eyes: Negative.   Respiratory: Negative for cough, shortness of breath and wheezing.   Cardiovascular: Negative for chest pain, palpitations and leg swelling.  Gastrointestinal: Negative for blood in stool, constipation, diarrhea, heartburn and melena.  Genitourinary: Negative.   Neurological: Negative for dizziness, loss of consciousness and headaches.  Psychiatric/Behavioral: Negative for depression. The patient is not nervous/anxious and does not have insomnia.     Family history- Review and unchanged  Social history- Review and unchanged  Physical Exam: BP 132/68   Pulse 86   Resp 14   Ht 5\' 10"  (1.778 m)   Wt 193 lb (87.5 kg)   SpO2 98%   BMI 27.69 kg/m  Wt Readings from Last  3 Encounters:  06/17/16 193 lb (87.5 kg)  03/19/16 188 lb 12.8 oz (85.6 kg)  03/11/16 187 lb 3.2 oz (84.9 kg)   General appearance: alert, no distress, WD/WN, male HEENT: normocephalic, sclerae anicteric, TMs pearly, nares patent, no discharge or erythema, pharynx normal Oral cavity: MMM, no lesions Neck: supple, no lymphadenopathy, no thyromegaly, no masses Heart: RRR, normal S1, S2, no murmurs Lungs: CTA bilaterally, no wheezes, rhonchi, or rales Abdomen: +bs, soft, non tender, non distended, no masses, no hepatomegaly, no splenomegaly Musculoskeletal: nontender, no swelling, no obvious deformity Extremities: no edema, no cyanosis, no clubbing Pulses: 2+ symmetric, upper and lower extremities, normal cap refill Neurological: alert, oriented x 3, CN2-12 intact, no cerebellar signs, gait antalgic and slow Psychiatric: normal affect, behavior normal, pleasant    Vicie Mutters, PA-C 10:40 AM [facility]

## 2016-06-17 NOTE — Patient Instructions (Signed)
If you change your mind and would like information/to try the cymbalta for your back and depression let us know   Major Depressive Disorder, Adult Major depressive disorder (MDD) is a mental health condition. It may also be called clinical depression or unipolar depression. MDD usually causes feelings of sadness, hopelessness, or helplessness. MDD can also cause physical symptoms. It can interfere with work, school, relationships, and other everyday activities. MDD may be mild, moderate, or severe. It may occur once (single episode major depressive disorder) or it may occur multiple times (recurrent major depressive disorder). What are the causes? The exact cause of this condition is not known. MDD is most likely caused by a combination of things, which may include:  Genetic factors. These are traits that are passed along from parent to child.  Individual factors. Your personality, your behavior, and the way you handle your thoughts and feelings may contribute to MDD. This includes personality traits and behaviors learned from others.  Physical factors, such as:  Differences in the part of your brain that controls emotion. This part of your brain may be different than it is in people who do not have MDD.  Long-term (chronic) medical or psychiatric illnesses.  Social factors. Traumatic experiences or major life changes may play a role in the development of MDD. What increases the risk? This condition is more likely to develop in women. The following factors may also make you more likely to develop MDD:  A family history of depression.  Troubled family relationships.  Abnormally low levels of certain brain chemicals.  Traumatic events in childhood, especially abuse or the loss of a parent.  Being under a lot of stress, or long-term stress, especially from upsetting life experiences or losses.  A history of:  Chronic physical illness.  Other mental health disorders.  Substance  abuse.  Poor living conditions.  Experiencing social exclusion or discrimination on a regular basis. What are the signs or symptoms? The main symptoms of MDD typically include:  Constant depressed or irritable mood.  Loss of interest in things and activities. MDD symptoms may also include:  Sleeping or eating too much or too little.  Unexplained weight change.  Fatigue or low energy.  Feelings of worthlessness or guilt.  Difficulty thinking clearly or making decisions.  Thoughts of suicide or of harming others.  Physical agitation or weakness.  Isolation. Severe cases of MDD may also occur with other symptoms, such as:  Delusions or hallucinations, in which you imagine things that are not real (psychotic depression).  Low-level depression that lasts at least a year (chronic depression or persistent depressive disorder).  Extreme sadness and hopelessness (melancholic depression).  Trouble speaking and moving (catatonic depression). How is this diagnosed? This condition may be diagnosed based on:  Your symptoms.  Your medical history, including your mental health history. This may involve tests to evaluate your mental health. You may be asked questions about your lifestyle, including any drug and alcohol use, and how long you have had symptoms of MDD.  A physical exam.  Blood tests to rule out other conditions. You must have a depressed mood and at least four other MDD symptoms most of the day, nearly every day in the same 2-week timeframe before your health care provider can confirm a diagnosis of MDD. How is this treated? This condition is usually treated by mental health professionals, such as psychologists, psychiatrists, and clinical social workers. You may need more than one type of treatment. Treatment may include:  Psychotherapy. This is also called talk therapy or counseling. Types of psychotherapy include:  Cognitive behavioral therapy (CBT). This type of  therapy teaches you to recognize unhealthy feelings, thoughts, and behaviors, and replace them with positive thoughts and actions.  Interpersonal therapy (IPT). This helps you to improve the way you relate to and communicate with others.  Family therapy. This treatment includes members of your family.  Medicine to treat anxiety and depression, or to help you control certain emotions and behaviors.  Lifestyle changes, such as:  Limiting alcohol and drug use.  Exercising regularly.  Getting plenty of sleep.  Making healthy eating choices.  Spending more time outdoors. Treatments involving stimulation of the brain can be used in situations with extremely severe symptoms, or when medicine or other therapies do not work over time. These treatments include electroconvulsive therapy, transcranial magnetic stimulation, and vagal nerve stimulation. Follow these instructions at home: Activity  Return to your normal activities as told by your health care provider.  Exercise regularly and spend time outdoors as told by your health care provider. General instructions  Take over-the-counter and prescription medicines only as told by your health care provider.  Do not drink alcohol. If you drink alcohol, limit your alcohol intake to no more than 1 drink a day for nonpregnant women and 2 drinks a day for men. One drink equals 12 oz of beer, 5 oz of wine, or 1 oz of hard liquor. Alcohol can affect any antidepressant medicines you are taking. Talk to your health care provider about your alcohol use.  Eat a healthy diet and get plenty of sleep.  Find activities that you enjoy doing, and make time to do them.  Consider joining a support group. Your health care provider may be able to recommend a support group.  Keep all follow-up visits as told by your health care provider. This is important. Where to find more information: [employer] on Mental Illness  www.nami.org [place] National  Institute of Mental Health  https://carter.com/ National Suicide Prevention Lifeline  1-800-273-TALK 854-485-1474). This is free, 24-hour help. Contact a health care provider if:  Your symptoms get worse.  You develop new symptoms. Get help right away if:  You self-harm.  You have serious thoughts about hurting yourself or others.  You see, hear, taste, smell, or feel things that are not present (hallucinate). This information is not intended to replace advice given to you by your health care provider. Make sure you discuss any questions you have with your health care provider. Document Released: 11/01/2012 Document Revised: 03/13/2016 Document Reviewed: 01/16/2016 Elsevier Interactive Patient Education  2017 [employer].

## 2016-06-18 LAB — HEMOGLOBIN A1C
HEMOGLOBIN A1C: 5.7 % — AB (ref <5.7)
Mean Plasma Glucose: 117 mg/dL

## 2016-06-19 DIAGNOSIS — M5416 Radiculopathy, lumbar region: Secondary | ICD-10-CM | POA: Diagnosis not present

## 2016-06-24 DIAGNOSIS — M5416 Radiculopathy, lumbar region: Secondary | ICD-10-CM | POA: Diagnosis not present

## 2016-06-26 DIAGNOSIS — M5416 Radiculopathy, lumbar region: Secondary | ICD-10-CM | POA: Diagnosis not present

## 2016-07-02 DIAGNOSIS — M5416 Radiculopathy, lumbar region: Secondary | ICD-10-CM | POA: Diagnosis not present

## 2016-07-04 DIAGNOSIS — M5126 Other intervertebral disc displacement, lumbar region: Secondary | ICD-10-CM | POA: Diagnosis not present

## 2016-07-04 DIAGNOSIS — M5416 Radiculopathy, lumbar region: Secondary | ICD-10-CM | POA: Diagnosis not present

## 2016-07-16 DIAGNOSIS — M7130 Other bursal cyst, unspecified site: Secondary | ICD-10-CM | POA: Diagnosis not present

## 2016-07-16 DIAGNOSIS — Z6827 Body mass index (BMI) 27.0-27.9, adult: Secondary | ICD-10-CM | POA: Diagnosis not present

## 2016-07-16 DIAGNOSIS — M48062 Spinal stenosis, lumbar region with neurogenic claudication: Secondary | ICD-10-CM | POA: Diagnosis not present

## 2016-07-16 DIAGNOSIS — M545 Low back pain: Secondary | ICD-10-CM | POA: Diagnosis not present

## 2016-07-16 DIAGNOSIS — R030 Elevated blood-pressure reading, without diagnosis of hypertension: Secondary | ICD-10-CM | POA: Diagnosis not present

## 2016-07-16 DIAGNOSIS — M5416 Radiculopathy, lumbar region: Secondary | ICD-10-CM | POA: Diagnosis not present

## 2016-07-18 ENCOUNTER — Other Ambulatory Visit: Payer: Self-pay | Admitting: Internal Medicine

## 2016-09-02 ENCOUNTER — Other Ambulatory Visit: Payer: Self-pay | Admitting: Internal Medicine

## 2016-09-24 ENCOUNTER — Ambulatory Visit (INDEPENDENT_AMBULATORY_CARE_PROVIDER_SITE_OTHER): Payer: Medicare Other | Admitting: Internal Medicine

## 2016-09-24 ENCOUNTER — Encounter: Payer: Self-pay | Admitting: Internal Medicine

## 2016-09-24 VITALS — BP 118/66 | HR 52 | Temp 97.6°F | Resp 16 | Ht 70.5 in | Wt 193.0 lb

## 2016-09-24 DIAGNOSIS — E1122 Type 2 diabetes mellitus with diabetic chronic kidney disease: Secondary | ICD-10-CM

## 2016-09-24 DIAGNOSIS — N182 Chronic kidney disease, stage 2 (mild): Secondary | ICD-10-CM

## 2016-09-24 DIAGNOSIS — E559 Vitamin D deficiency, unspecified: Secondary | ICD-10-CM | POA: Diagnosis not present

## 2016-09-24 DIAGNOSIS — Z136 Encounter for screening for cardiovascular disorders: Secondary | ICD-10-CM

## 2016-09-24 DIAGNOSIS — N320 Bladder-neck obstruction: Secondary | ICD-10-CM

## 2016-09-24 DIAGNOSIS — Z9861 Coronary angioplasty status: Secondary | ICD-10-CM | POA: Diagnosis not present

## 2016-09-24 DIAGNOSIS — I10 Essential (primary) hypertension: Secondary | ICD-10-CM

## 2016-09-24 DIAGNOSIS — Z79899 Other long term (current) drug therapy: Secondary | ICD-10-CM | POA: Diagnosis not present

## 2016-09-24 DIAGNOSIS — Z1212 Encounter for screening for malignant neoplasm of rectum: Secondary | ICD-10-CM

## 2016-09-24 DIAGNOSIS — Z125 Encounter for screening for malignant neoplasm of prostate: Secondary | ICD-10-CM

## 2016-09-24 DIAGNOSIS — I2510 Atherosclerotic heart disease of native coronary artery without angina pectoris: Secondary | ICD-10-CM

## 2016-09-24 DIAGNOSIS — E782 Mixed hyperlipidemia: Secondary | ICD-10-CM

## 2016-09-24 NOTE — Patient Instructions (Signed)

## 2016-09-24 NOTE — Progress Notes (Signed)
[facility]   [name], M.D.    [name]. [name], P.A.-C      Starlyn Skeans, P.A.-C  [facility]                [address] [place] [address] Telephone [phone] Telefax [phone] Comprehensive Evaluation & Examination     This very nice 81 y.o. MWM presents for a comprehensive evaluation and management of multiple medical co-morbidities.  Patient has been followed for HTN, ASCAD/PCA,   T2_NIDDM/CKD2, Hyperlipidemia and Vitamin D Deficiency. He reports still limitations from back surg for Sp stenosis and synovial cyst in July 2017 by Dr Vertell Limber.      HTN predates since 2000 and in 2010, he underwent PCA/DES to the LAD and then in 2011 underwent a 2sd PCA for a NSTMI and is followed by Dr Ellyn Hack. Today's BP is at goal - 118/66. Patient denies any cardiac symptoms as chest pain, palpitations, shortness of breath, dizziness or ankle swelling.     Patient's hyperlipidemia is controlled with diet and Zetia (Statin Intolerant). Patient denies myalgias or other medication SE's. Last lipids were at goal: Lab Results  Component Value Date   CHOL 158 06/17/2016   HDL 53 06/17/2016   LDLCALC 90 06/17/2016   TRIG 77 06/17/2016   CHOLHDL 3.0 06/17/2016      Patient has T2_NIDDM (2006) and has CKD2 and is well controlled on Metformin.  Patient denies reactive hypoglycemic symptoms, visual blurring, diabetic polys or paresthesias. He does not monitor CBG's. Last A1c was near goal: Lab Results  Component Value Date   HGBA1C 5.7 (H) 06/17/2016       Finally, patient has history of Vitamin D Deficiency ("22" in 2008) and last vitamin D was at goal: Lab Results  Component Value Date   VD25OH 64 03/11/2016   Current Outpatient Prescriptions on File Prior to Visit  Medication Sig  . aspirin EC 81 MG tablet Take 1 tablet (81 mg total) by mouth every other day. (Patient taking differently: Take  81 mg by mouth daily. )  . cholecalciferol (VITAMIN D) 1000 UNITS tablet Take 2,000 Units by mouth daily.   Marland Kitchen CINNAMON PO Take 1,000 mg by mouth daily.   . clopidogrel (PLAVIX) 75 MG tablet TAKE 1 TABLET BY MOUTH EVERY DAY  . doxazosin (CARDURA) 4 MG tablet TAKE 1 TABLET BY MOUTH AT BEDTIME  . ezetimibe (ZETIA) 10 MG tablet TAKE 1 TABLET BY MOUTH EVERY DAY  . finasteride (PROSCAR) 5 MG tablet Take 5 mg by mouth daily.  Marland Kitchen lisinopril (PRINIVIL,ZESTRIL) 5 MG tablet TAKE 1 TABLET BY MOUTH DAILY.  . Magnesium 500 MG CAPS Take 1 capsule by mouth daily.  . metFORMIN (GLUCOPHAGE-XR) 500 MG 24 hr tablet TAKE 2 TABLETS BY MOUTH TWICE DAILY  . fenofibrate 160 MG tablet Take 1 tablet (160 mg total) by mouth daily. (Patient not taking: Reported on 09/24/2016)   No current facility-administered medications on file prior to visit.    Allergies  Allergen Reactions  . Statins Other (See Comments)    Myalgias and cramping Both Crestor and Lipitor  . Tetanus Toxoids    Past Medical History:  Diagnosis Date  . Arthritis   . BPH (benign prostatic hyperplasia)   . CAD S/P percutaneous coronary angioplasty 12/2009; 08/2010   a) 6/'22 - NSTEMI: PCI to LAD: Promus Element 2.5 mm  x 15 mm DES; b) Cath for Angina: Prox LAD 60-70% pre-stent with FFR 0.82, 90% RVM -- Med Rx, EF 50-55%  . Essential hypertension   . Hyperlipidemia with target LDL less than 70    Statin intolerance  . Non-ST elevation MI (NSTEMI) [facility]) June 2011   PCI to LAD  . Pneumonia    DEC 2016  TX WITH ANTIBIOTIC  . Type 2 diabetes mellitus without complications [facility])    Health Maintenance  Topic Date Due  . OPHTHALMOLOGY EXAM  12/21/2015  . FOOT EXAM  08/27/2016  . TETANUS/TDAP  06/27/2020 (Originally 07/21/2010)  . HEMOGLOBIN A1C  12/15/2016  . INFLUENZA VACCINE  Completed  . PNA vac Low Risk Adult  Completed   Immunization History  Administered Date(s) Administered  . Influenza, High Dose Seasonal PF 06/01/2013, 04/18/2014,  05/23/2015, 05/20/2016  . Influenza-Unspecified 05/13/2011  . Pneumococcal Conjugate-13 04/18/2014  . Pneumococcal Polysaccharide-23 07/21/2002, 05/23/2015  . Td 07/21/2000   Past Surgical History:  Procedure Laterality Date  . CARDIAC CATHETERIZATION  12/2009   Proximal LAD stenosis followed by a significant 80-90% distal stenosis  . CARDIAC CATHETERIZATION  February 2012    90% ostial RV marginal branch; 60-70% proximal LAD with widely patent distal stent. FFR 0.82; medical therapy  . CATARACT EXTRACTION W/ INTRAOCULAR LENS  IMPLANT, BILATERAL    . CORONARY ANGIOPLASTY  12/2009   PTCA to proximal LAD; PCI with Promus Element DES stent  2.5 mm x 15 mm  distalLAD - .for non-ST elevation MI  . DENTAL SURGERY    . LUMBAR LAMINECTOMY/DECOMPRESSION MICRODISCECTOMY N/A 08/09/2015   Procedure: Lumbar Four-Five, Lumbar Five- Sacral One Decompressive Lumbar Laminectomy with Resection of Synovial Cyst;  Surgeon: Erline Levine, MD;  Location: [facility];  Service: Neurosurgery;  Laterality: N/A;  L4 to S1 Decompressive Lumbar Laminectomy  . LUMBAR LAMINECTOMY/DECOMPRESSION MICRODISCECTOMY Right 01/21/2016   Procedure: Redo Right Lumbar Five-Sacral One Laminectomy for synovial cyst;  Surgeon: Erline Levine, MD;  Location: [facility];  Service: Neurosurgery;  Laterality: Right;  right  . NASAL FRACTURE SURGERY    . TRANSTHORACIC ECHOCARDIOGRAM  June 2011   - (EF not reported) Moderately dilated LV; moderate hypokinesis of anteroseptal and anterior wall consistent with MI. Grade 1 diastolic dysfunction. Mild to moderately dilated LA  . VASECTOMY     Family History  Problem Relation Age of Onset  . Cancer Father     lung  . Stroke Father   . Cancer Sister     breast  . Cancer Brother     lung  . Cancer Son     history of prostate   Social History   Social History  . Marital status: Married    Spouse name: N/A  . Number of children: N/A  . Years of education: N/A   Occupational History  .  Retired Geneticist, molecular from [employer]  . Smoking status: Current pipe smoker Every Day     Years: 60.00  . Smokeless tobacco: Never Used  . Alcohol use drinks 4 ounces of vodka daily  . Drug use: No  . Sexual activity: Not on file   Social History Narrative   He is a married father of 2 with no grandchildren as of yet. He continued to smoke a pipe several times the course of day. He does have an occasional alcoholic beverage. While not involved a standard exercise routine, he is very active with walking and working in the  yard, splitting wood and doing aggressive yard work including Production designer, theatre/television/film.    ROS Constitutional: Denies fever, chills, weight loss/gain, headaches, insomnia,  night sweats or change in appetite. Does c/o fatigue. Eyes: Denies redness, blurred vision, diplopia, discharge, itchy or watery eyes.  ENT: Denies discharge, congestion, post nasal drip, epistaxis, sore throat, earache, hearing loss, dental pain, Tinnitus, Vertigo, Sinus pain or snoring.  Cardio: Denies chest pain, palpitations, irregular heartbeat, syncope, dyspnea, diaphoresis, orthopnea, PND, claudication or edema Respiratory: denies cough, dyspnea, DOE, pleurisy, hoarseness, laryngitis or wheezing.  Gastrointestinal: Denies dysphagia, heartburn, reflux, water brash, pain, cramps, nausea, vomiting, bloating, diarrhea, constipation, hematemesis, melena, hematochezia, jaundice or hemorrhoids Genitourinary: Denies dysuria, frequency, urgency, nocturia, hesitancy, discharge, hematuria or flank pain Musculoskeletal: Denies arthralgia, myalgia, stiffness, Jt. Swelling, pain, limp or strain/sprain. Denies Falls. Skin: Denies puritis, rash, hives, warts, acne, eczema or change in skin lesion Neuro: No weakness, tremor, incoordination, spasms, paresthesia or pain Psychiatric: Denies confusion, memory loss or sensory loss. Denies Depression. Endocrine: Denies  change in weight, skin, hair change, nocturia, and paresthesia, diabetic polys, visual blurring or hyper / hypo glycemic episodes.  Heme/Lymph: No excessive bleeding, bruising or enlarged lymph nodes.  Physical Exam  BP 118/66   Pulse (!) 52   Temp 97.6 F (36.4 C)   Resp 16   Ht 5' 10.5" (1.791 m)   Wt 193 lb (87.5 kg)   BMI 27.30 kg/m   General Appearance: Well nourished, in no apparent distress.  Eyes: PERRLA, EOMs, conjunctiva no swelling or erythema, normal fundi and vessels. Sinuses: No frontal/maxillary tenderness ENT/Mouth: EACs patent / TMs  nl. Nares clear without erythema, swelling, mucoid exudates. Oral hygiene is good. No erythema, swelling, or exudate. Tongue normal, non-obstructing. Tonsils not swollen or erythematous. Hearing normal.  Neck: Supple, thyroid normal. No bruits, nodes or JVD. Respiratory: Respiratory effort normal.  BS equal and clear bilateral without rales, rhonci, wheezing or stridor. Cardio: Heart sounds are normal with regular rate and rhythm and no murmurs, rubs or gallops. Peripheral pulses are normal and equal bilaterally without edema. No aortic or femoral bruits. Chest: symmetric with normal excursions and percussion.  Abdomen: Soft, with Nl bowel sounds. Nontender, no guarding, rebound, hernias, masses, or organomegaly.  Lymphatics: Non tender without lymphadenopathy.  Genitourinary: No hernias.Testes nl. DRE - prostate nl for age - smooth & firm w/o nodules. Musculoskeletal: Full ROM all peripheral extremities, joint stability, 5/5 strength, and normal gait. Skin: Warm and dry without rashes, lesions, cyanosis, clubbing or  ecchymosis.  Neuro: Cranial nerves intact, reflexes equal bilaterally. Normal muscle tone, no cerebellar symptoms. Sensation intact to touch, vibratory and monofilament to the toes bilaterally.  Pysch: Alert and oriented X 3 with normal affect, insight and judgment appropriate.   Assessment and Plan  1. Essential  hypertension  - Microalbumin / creatinine urine ratio - EKG 12-Lead - [place], RETROPERITNL ABD,  LTD - Urinalysis, Routine w reflex microscopic - CBC with Differential/Platelet - BASIC METABOLIC PANEL WITH GFR - Magnesium - TSH  2. Mixed hyperlipidemia  - EKG 12-Lead - [place], RETROPERITNL ABD,  LTD - Hepatic function panel - Lipid panel - TSH  3. Type 2 diabetes mellitus with stage 2 chronic kidney disease, without long-term current use of insulin (HCC)  - Microalbumin / creatinine urine ratio - EKG 12-Lead - [place], RETROPERITNL ABD,  LTD - HM DIABETES FOOT EXAM - LOW EXTREMITY NEUR EXAM DOCUM - Hemoglobin A1c - Insulin, random  4. Vitamin D deficiency  -  VITAMIN D 25 Hydroxy   5. CAD S/P percutaneous coronary angioplasty and PCI- DES mid LAD in the setting of non-STEMI  - EKG 12-Lead  6. Screening for rectal cancer  - POC Hemoccult Bld/Stl   7. Prostate cancer screening  - PSA  8. Screening for ischemic heart disease  - EKG 12-Lead  9. Screening for AAA (aortic abdominal aneurysm)  - [place], RETROPERITNL ABD,  LTD  10. Bladder neck obstruction  - PSA  11. Medication management  - Urinalysis, Routine w reflex microscopic - CBC with Differential/Platelet - BASIC METABOLIC PANEL WITH GFR - Hepatic function panel - Magnesium - Lipid panel - TSH - Hemoglobin A1c - Insulin, random - VITAMIN D 25 Hydroxy        Continue prudent diet as discussed, weight control, BP monitoring, regular exercise, and medications as discussed.  Discussed med effects and SE's. Routine screening labs and tests as requested with regular follow-up as recommended. Over 40 minutes of exam, counseling, chart review and high complex critical decision making was performed

## 2016-09-25 LAB — URINALYSIS, ROUTINE W REFLEX MICROSCOPIC
Bilirubin Urine: NEGATIVE
GLUCOSE, UA: NEGATIVE
HGB URINE DIPSTICK: NEGATIVE
KETONES UR: NEGATIVE
Leukocytes, UA: NEGATIVE
Nitrite: NEGATIVE
PH: 5.5 (ref 5.0–8.0)
Protein, ur: NEGATIVE
Specific Gravity, Urine: 1.008 (ref 1.001–1.035)

## 2016-09-25 LAB — MICROALBUMIN / CREATININE URINE RATIO: CREATININE, URINE: 34 mg/dL (ref 20–370)

## 2016-09-25 LAB — CBC WITH DIFFERENTIAL/PLATELET
Basophils Absolute: 87 cells/uL (ref 0–200)
Basophils Relative: 1 %
EOS ABS: 435 {cells}/uL (ref 15–500)
Eosinophils Relative: 5 %
HEMATOCRIT: 45.4 % (ref 38.5–50.0)
HEMOGLOBIN: 14.8 g/dL (ref 13.2–17.1)
LYMPHS ABS: 1827 {cells}/uL (ref 850–3900)
LYMPHS PCT: 21 %
MCH: 30.3 pg (ref 27.0–33.0)
MCHC: 32.6 g/dL (ref 32.0–36.0)
MCV: 92.8 fL (ref 80.0–100.0)
MONO ABS: 957 {cells}/uL — AB (ref 200–950)
MPV: 10.7 fL (ref 7.5–12.5)
Monocytes Relative: 11 %
NEUTROS PCT: 62 %
Neutro Abs: 5394 cells/uL (ref 1500–7800)
Platelets: 250 10*3/uL (ref 140–400)
RBC: 4.89 MIL/uL (ref 4.20–5.80)
RDW: 13.6 % (ref 11.0–15.0)
WBC: 8.7 10*3/uL (ref 3.8–10.8)

## 2016-09-25 LAB — BASIC METABOLIC PANEL WITH GFR
BUN: 14 mg/dL (ref 7–25)
CALCIUM: 9.4 mg/dL (ref 8.6–10.3)
CO2: 27 mmol/L (ref 20–31)
Chloride: 105 mmol/L (ref 98–110)
Creat: 0.89 mg/dL (ref 0.70–1.18)
GFR, EST NON AFRICAN AMERICAN: 82 mL/min (ref >=60)
GFR, Est African American: >89 mL/min (ref >=60)
GLUCOSE: 75 mg/dL (ref 65–99)
POTASSIUM: 4.2 mmol/L (ref 3.5–5.3)
Sodium: 141 mmol/L (ref 135–146)

## 2016-09-25 LAB — LIPID PANEL
CHOL/HDL RATIO: 3.1 ratio (ref <5.0)
Cholesterol: 169 mg/dL (ref <200)
HDL: 55 mg/dL (ref >40)
LDL Cholesterol: 97 mg/dL (ref <100)
TRIGLYCERIDES: 84 mg/dL (ref <150)
VLDL: 17 mg/dL (ref <30)

## 2016-09-25 LAB — HEMOGLOBIN A1C
Hgb A1c MFr Bld: 5.5 % (ref <5.7)
Mean Plasma Glucose: 111 mg/dL

## 2016-09-25 LAB — HEPATIC FUNCTION PANEL
ALBUMIN: 4.2 g/dL (ref 3.6–5.1)
ALK PHOS: 57 U/L (ref 40–115)
ALT: 13 U/L (ref 9–46)
AST: 16 U/L (ref 10–35)
BILIRUBIN INDIRECT: 0.3 mg/dL (ref 0.2–1.2)
Bilirubin, Direct: 0.1 mg/dL (ref <=0.2)
TOTAL PROTEIN: 6.5 g/dL (ref 6.1–8.1)
Total Bilirubin: 0.4 mg/dL (ref 0.2–1.2)

## 2016-09-25 LAB — VITAMIN D 25 HYDROXY (VIT D DEFICIENCY, FRACTURES): Vit D, 25-Hydroxy: 60 ng/mL (ref 30–100)

## 2016-09-25 LAB — PSA: PSA: 1.6 ng/mL (ref <=4.0)

## 2016-09-25 LAB — MAGNESIUM: Magnesium: 1.9 mg/dL (ref 1.5–2.5)

## 2016-09-25 LAB — INSULIN, RANDOM: Insulin: 4.3 u[IU]/mL (ref 2.0–19.6)

## 2016-09-25 LAB — TSH: TSH: 2.74 m[IU]/L (ref 0.40–4.50)

## 2016-10-28 ENCOUNTER — Other Ambulatory Visit: Payer: Self-pay | Admitting: Internal Medicine

## 2016-10-29 DIAGNOSIS — H40013 Open angle with borderline findings, low risk, bilateral: Secondary | ICD-10-CM | POA: Diagnosis not present

## 2016-10-29 DIAGNOSIS — Z961 Presence of intraocular lens: Secondary | ICD-10-CM | POA: Diagnosis not present

## 2016-10-29 DIAGNOSIS — H35362 Drusen (degenerative) of macula, left eye: Secondary | ICD-10-CM | POA: Diagnosis not present

## 2016-10-29 DIAGNOSIS — E119 Type 2 diabetes mellitus without complications: Secondary | ICD-10-CM | POA: Diagnosis not present

## 2016-11-19 ENCOUNTER — Encounter: Payer: Self-pay | Admitting: Internal Medicine

## 2016-12-30 ENCOUNTER — Ambulatory Visit: Payer: Self-pay | Admitting: Internal Medicine

## 2017-01-07 NOTE — Progress Notes (Addendum)
MEDICARE ANNUAL WELLNESS VISIT AND FOLLOW UP Assessment:   CAD S/P percutaneous coronary angioplasty and PCI- DES mid LAD in the setting of non-STEMI -followed by cards -medically controlled currently   Essential hypertension -Well controlled currently -cont meds -DASH diet -monitor at home - TSH  Type II diabetes mellitus with nephropathy (HCC) -diet and exercise -cont meds - Hemoglobin A1c   Synovial cyst of lumbar facet joint -resolved s/p surgery for removal   BPH (benign prostatic hyperplasia) -cont meds -not currently symptomatic with meds on board   Hyperlipidemia with statin intolerance -cont meds - Lipid panel  Tobacco dependency -not ready to quit - get CXR  Vitamin D deficiency -cont supplement  Medication management - CBC with Differential/Platelet - BASIC METABOLIC PANEL WITH GFR - Hepatic function panel  Aortic Atherosclerosis Control blood pressure, cholesterol, glucose, increase exercise.  Advised to quit smoking  Over 30 minutes of exam, counseling, chart review, and critical decision making was performed  Plan:   During the course of the visit the patient was educated and counseled about appropriate screening and preventive services including:    Pneumococcal vaccine   Influenza vaccine  Prevnar 13  Td vaccine  Screening electrocardiogram  Colorectal cancer screening  Diabetes screening  Glaucoma screening  Nutrition counseling    Subjective:  David [name] is a 81 y.o. male who presents for Medicare Annual Wellness Visit and 3 month follow up for HTN, hyperlipidemia, prediabetes, and vitamin D Def.   His blood pressure has been controlled at home, today their BP is BP: 120/68 He does workout, but limited, has back surgery July 2017. He denies chest pain, shortness of breath, dizziness.  Smokes pipe daily.  He is not on cholesterol medication and denies myalgias. His cholesterol is at goal. The cholesterol last visit  was:   Lab Results  Component Value Date   CHOL 169 09/24/2016   HDL 55 09/24/2016   LDLCALC 97 09/24/2016   TRIG 84 09/24/2016   CHOLHDL 3.1 09/24/2016   He has been working on diet and exercise for prediabetes, and denies foot ulcerations, hyperglycemia, hypoglycemia , increased appetite, nausea, paresthesia of the feet, polydipsia, polyuria, visual disturbances, vomiting and weight loss. Last A1C in the office was:  Lab Results  Component Value Date   HGBA1C 5.5 09/24/2016   Last GFR Lab Results  Component Value Date   GFRNONAA 82 09/24/2016    Patient is on Vitamin D supplement.   Lab Results  Component Value Date   VD25OH 60 09/24/2016     Patient reports that he has been recovering well from back surgery to have a cyst removed from his back.   His pain in his legs is completely gone.   BMI is Body mass index is 27.16 kg/m., he is working on diet and exercise. Wt Readings from Last 3 Encounters:  01/08/17 192 lb (87.1 kg)  09/24/16 193 lb (87.5 kg)  06/17/16 193 lb (87.5 kg)     Medication Review: Current Outpatient Prescriptions on File Prior to Visit  Medication Sig Dispense Refill  . aspirin EC 81 MG tablet Take 1 tablet (81 mg total) by mouth every other day. (Patient taking differently: Take 81 mg by mouth daily. ) 30 tablet 11  . cholecalciferol (VITAMIN D) 1000 UNITS tablet Take 2,000 Units by mouth daily.     Marland Kitchen CINNAMON PO Take 1,000 mg by mouth daily.     . clopidogrel (PLAVIX) 75 MG tablet TAKE 1 TABLET BY MOUTH EVERY  DAY 90 tablet 3  . doxazosin (CARDURA) 4 MG tablet TAKE 1 TABLET BY MOUTH AT BEDTIME 90 tablet 1  . ezetimibe (ZETIA) 10 MG tablet TAKE 1 TABLET BY MOUTH EVERY DAY 90 tablet 1  . fenofibrate 160 MG tablet Take 1 tablet (160 mg total) by mouth daily. 90 tablet 3  . finasteride (PROSCAR) 5 MG tablet Take 5 mg by mouth daily.    Marland Kitchen lisinopril (PRINIVIL,ZESTRIL) 5 MG tablet TAKE 1 TABLET BY MOUTH DAILY. 90 tablet 3  . Magnesium 500 MG CAPS Take 1  capsule by mouth daily.    . metFORMIN (GLUCOPHAGE-XR) 500 MG 24 hr tablet TAKE 2 TABLETS BY MOUTH TWICE DAILY 360 tablet 1   No current facility-administered medications on file prior to visit.     Current Problems (verified) Patient Active Problem List   Diagnosis Date Noted  . Synovial cyst of lumbar facet joint 08/09/2015  . BPH (benign prostatic hyperplasia) 11/16/2014  . Vitamin D deficiency 12/25/2013  . Medication management 12/25/2013  . Tobacco dependency 02/22/2013  . Hyperlipidemia with statin intolerance   . Essential hypertension   . Type II diabetes mellitus with nephropathy ([place])   . CAD S/P percutaneous coronary angioplasty and PCI- DES mid LAD in the setting of non-STEMI 12/31/2009    Screening Tests Immunization History  Administered Date(s) Administered  . Influenza, High Dose Seasonal PF 06/01/2013, 04/18/2014, 05/23/2015, 05/20/2016  . Influenza-Unspecified 05/13/2011  . Pneumococcal Conjugate-13 04/18/2014  . Pneumococcal Polysaccharide-23 07/21/2002, 05/23/2015  . Td 07/21/2000   Preventative care: Last colonoscopy: 2015  Prior vaccinations: TD or Tdap: 2002, allergy to TDAP/TD Influenza: 2016  Pneumococcal: 2016 Prevnar13: 2015 Shingles/Zostavax: Declined  Names of Other Physician/Practitioners you currently use: 1. [facility] here for primary care 2. Dr. Herbert Deaner, eye doctor, last visit 2018 3. Judeth Horn, dentist, last visit May 2018 Patient Care Team: [name], MD as PCP - General (Internal Medicine) Laurence Spates, MD as Consulting Physician (Gastroenterology) Leonie Man, MD as Consulting Physician (Cardiology) Erline Levine, MD as Consulting Physician (Neurosurgery) Carolan Clines, MD as Consulting Physician (Urology)  Allergies Allergies  Allergen Reactions  . Statins Other (See Comments)    Myalgias and cramping Both Crestor and Lipitor  . Tetanus Toxoids     SURGICAL  HISTORY He  has a past surgical history that includes Cardiac catheterization (12/2009); Coronary angioplasty (12/2009); Cardiac catheterization (February 2012 ); Vasectomy; transthoracic echocardiogram (June 2011); Cataract extraction w/ intraocular lens  implant, bilateral; Dental surgery; Lumbar laminectomy/decompression microdiscectomy (N/A, 08/09/2015); Nasal fracture surgery; and Lumbar laminectomy/decompression microdiscectomy (Right, 01/21/2016). FAMILY HISTORY His family history includes Cancer in his brother, father, sister, and son; Stroke in his father. SOCIAL HISTORY He  reports that he has been smoking Pipe.  He has smoked for the past 60.00 years. He has never used smokeless tobacco. He reports that he drinks about 4.2 oz of alcohol per week . He reports that he does not use drugs.  MEDICARE WELLNESS OBJECTIVES: Tobacco use: He does smoke.  counseling given Alcohol Current alcohol use: 4 oz of vodka Diet: well balanced Physical activity: Current Exercise Habits: The patient does not participate in regular exercise at present, Exercise limited by: orthopedic condition(s) Cardiac risk factors: Cardiac Risk Factors include: advanced age (>44men, >66 women);dyslipidemia;hypertension;male gender;sedentary lifestyle;smoking/ tobacco exposure Depression/mood screen:   Depression screen [facility] 2/9 01/08/2017  Decreased Interest 0  Down, Depressed, Hopeless 0  PHQ - 2 Score 0    ADLs:  In your present  state of health, do you have any difficulty performing the following activities: 01/08/2017 09/24/2016  Hearing? N Y  Vision? N -  Difficulty concentrating or making decisions? N -  Walking or climbing stairs? N -  Dressing or bathing? N -  Doing errands, shopping? N -  Preparing Food and eating ? N -  Using the Toilet? N -  In the past six months, have you accidently leaked urine? N -  Do you have problems with loss of bowel control? N -  Managing your Medications? N -  Managing your  Finances? N -  Housekeeping or managing your Housekeeping? N -  Some recent data might be hidden     Cognitive Testing  Alert? Yes  Normal Appearance?Yes  Oriented to person? Yes  Place? Yes   Time? Yes  Recall of three objects?  Yes  Can perform simple calculations? Yes  Displays appropriate judgment?Yes  Can read the correct time from a watch face?Yes  EOL planning: Does Patient Have a Medical Advance Directive?: Yes Type of Advance Directive: Healthcare Power of Attorney, Living will Copy of [place] in Chart?: No - copy requested   Objective:   Today's Vitals   01/08/17 1032  BP: 120/68  Pulse: 64  Resp: 16  Temp: 97.5 F (36.4 C)  SpO2: 97%  Weight: 192 lb (87.1 kg)  Height: 5' 10.5" (1.791 m)   Body mass index is 27.16 kg/m.  General appearance: alert, no distress, WD/WN, male HEENT: normocephalic, sclerae anicteric, TMs pearly, nares patent, no discharge or erythema, pharynx normal Oral cavity: MMM, no lesions Neck: supple, no lymphadenopathy, no thyromegaly, no masses Heart: RRR, normal S1, S2, no murmurs Lungs: CTA bilaterally, no wheezes, rhonchi, or rales Abdomen: +bs, soft, non tender, non distended, no masses, no hepatomegaly, no splenomegaly Musculoskeletal: nontender, no swelling, no obvious deformity Extremities: no edema, no cyanosis, no clubbing Pulses: 2+ symmetric, upper and lower extremities, normal cap refill Neurological: alert, oriented x 3, CN2-12 intact, strength normal upper extremities and lower extremities, sensation normal throughout, DTRs 2+ throughout, no cerebellar signs, gait normal Psychiatric: normal affect, behavior normal, pleasant   Medicare Attestation I have personally reviewed: The patient's medical and social history Their use of alcohol, tobacco or illicit drugs Their current medications and supplements The patient's functional ability including ADLs,fall risks, home safety risks, cognitive, and  hearing and visual impairment Diet and physical activities Evidence for depression or mood disorders  The patient's weight, height, BMI, and visual acuity have been recorded in the chart.  I have made referrals, counseling, and provided education to the patient based on review of the above and I have provided the patient with a written personalized care plan for preventive services.     Vicie Mutters, PA-C   01/08/2017

## 2017-01-08 ENCOUNTER — Ambulatory Visit (HOSPITAL_COMMUNITY)
Admission: RE | Admit: 2017-01-08 | Discharge: 2017-01-08 | Disposition: A | Discharge status: Home / Self Care | Payer: Medicare Other | Source: Ambulatory Visit | Attending: Physician Assistant | Admitting: Physician Assistant

## 2017-01-08 ENCOUNTER — Encounter: Payer: Self-pay | Admitting: Physician Assistant

## 2017-01-08 ENCOUNTER — Ambulatory Visit (INDEPENDENT_AMBULATORY_CARE_PROVIDER_SITE_OTHER): Payer: Medicare Other | Admitting: Physician Assistant

## 2017-01-08 VITALS — BP 120/68 | HR 64 | Temp 97.5°F | Resp 16 | Ht 70.5 in | Wt 192.0 lb

## 2017-01-08 DIAGNOSIS — J432 Centrilobular emphysema: Secondary | ICD-10-CM

## 2017-01-08 DIAGNOSIS — E782 Mixed hyperlipidemia: Secondary | ICD-10-CM

## 2017-01-08 DIAGNOSIS — R6889 Other general symptoms and signs: Secondary | ICD-10-CM | POA: Diagnosis not present

## 2017-01-08 DIAGNOSIS — Z79899 Other long term (current) drug therapy: Secondary | ICD-10-CM | POA: Diagnosis not present

## 2017-01-08 DIAGNOSIS — F17200 Nicotine dependence, unspecified, uncomplicated: Secondary | ICD-10-CM | POA: Diagnosis not present

## 2017-01-08 DIAGNOSIS — I2510 Atherosclerotic heart disease of native coronary artery without angina pectoris: Secondary | ICD-10-CM | POA: Diagnosis not present

## 2017-01-08 DIAGNOSIS — E1121 Type 2 diabetes mellitus with diabetic nephropathy: Secondary | ICD-10-CM

## 2017-01-08 DIAGNOSIS — M7138 Other bursal cyst, other site: Secondary | ICD-10-CM | POA: Diagnosis not present

## 2017-01-08 DIAGNOSIS — Z0000 Encounter for general adult medical examination without abnormal findings: Secondary | ICD-10-CM

## 2017-01-08 DIAGNOSIS — N401 Enlarged prostate with lower urinary tract symptoms: Secondary | ICD-10-CM

## 2017-01-08 DIAGNOSIS — I10 Essential (primary) hypertension: Secondary | ICD-10-CM | POA: Diagnosis not present

## 2017-01-08 DIAGNOSIS — J449 Chronic obstructive pulmonary disease, unspecified: Secondary | ICD-10-CM | POA: Diagnosis not present

## 2017-01-08 DIAGNOSIS — Z9861 Coronary angioplasty status: Principal | ICD-10-CM

## 2017-01-08 DIAGNOSIS — E559 Vitamin D deficiency, unspecified: Secondary | ICD-10-CM

## 2017-01-08 DIAGNOSIS — I700 Atherosclerosis of aorta: Secondary | ICD-10-CM | POA: Insufficient documentation

## 2017-01-08 DIAGNOSIS — Z0001 Encounter for general adult medical examination with abnormal findings: Secondary | ICD-10-CM

## 2017-01-08 DIAGNOSIS — R350 Frequency of micturition: Secondary | ICD-10-CM

## 2017-01-08 LAB — CBC WITH DIFFERENTIAL/PLATELET
Basophils Absolute: 0 cells/uL (ref 0–200)
Basophils Relative: 0 %
EOS ABS: 360 {cells}/uL (ref 15–500)
Eosinophils Relative: 5 %
HEMATOCRIT: 46 % (ref 38.5–50.0)
Hemoglobin: 15.3 g/dL (ref 13.2–17.1)
LYMPHS PCT: 17 %
Lymphs Abs: 1224 cells/uL (ref 850–3900)
MCH: 31.2 pg (ref 27.0–33.0)
MCHC: 33.3 g/dL (ref 32.0–36.0)
MCV: 93.9 fL (ref 80.0–100.0)
MPV: 10.8 fL (ref 7.5–12.5)
Monocytes Absolute: 936 cells/uL (ref 200–950)
Monocytes Relative: 13 %
NEUTROS PCT: 65 %
Neutro Abs: 4680 cells/uL (ref 1500–7800)
Platelets: 197 10*3/uL (ref 140–400)
RBC: 4.9 MIL/uL (ref 4.20–5.80)
RDW: 13.6 % (ref 11.0–15.0)
WBC: 7.2 10*3/uL (ref 3.8–10.8)

## 2017-01-08 LAB — TSH: TSH: 1.87 mIU/L (ref 0.40–4.50)

## 2017-01-08 NOTE — Patient Instructions (Signed)
Chronic Obstructive Pulmonary Disease Chronic obstructive pulmonary disease (COPD) is a common lung condition in which airflow from the lungs is limited. COPD is a general term that can be used to describe many different lung problems that limit airflow, including both chronic bronchitis and emphysema. If you have COPD, your lung function will probably never return to normal, but there are measures you can take to improve lung function and make yourself feel better. What are the causes?  Smoking (common).  Exposure to secondhand smoke.  Genetic problems.  Chronic inflammatory lung diseases or recurrent infections. What are the signs or symptoms?  Shortness of breath, especially with physical activity.  Deep, persistent (chronic) cough with a large amount of thick mucus.  Wheezing.  Rapid breaths (tachypnea).  Gray or bluish discoloration (cyanosis) of the skin, especially in your fingers, toes, or lips.  Fatigue.  Weight loss.  Frequent infections or episodes when breathing symptoms become much worse (exacerbations).  Chest tightness. How is this diagnosed? Your health care provider will take a medical history and perform a physical examination to diagnose COPD. Additional tests for COPD may include:  Lung (pulmonary) function tests.  Chest X-ray.  CT scan.  Blood tests. How is this treated? Treatment for COPD may include:  Inhaler and nebulizer medicines. These help manage the symptoms of COPD and make your breathing more comfortable.  Supplemental oxygen. Supplemental oxygen is only helpful if you have a low oxygen level in your blood.  Exercise and physical activity. These are beneficial for nearly all people with COPD.  Lung surgery or transplant.  Nutrition therapy to gain weight, if you are underweight.  Pulmonary rehabilitation. This may involve working with a team of health care providers and specialists, such as respiratory, occupational, and physical  therapists. Follow these instructions at home:  Take all medicines (inhaled or pills) as directed by your health care provider.  Avoid over-the-counter medicines or cough syrups that dry up your airway (such as antihistamines) and slow down the elimination of secretions unless instructed otherwise by your health care provider.  If you are a smoker, the most important thing that you can do is stop smoking. Continuing to smoke will cause further lung damage and breathing trouble. Ask your health care provider for help with quitting smoking. He or she can direct you to community resources or hospitals that provide support.  Avoid exposure to irritants such as smoke, chemicals, and fumes that aggravate your breathing.  Use oxygen therapy and pulmonary rehabilitation if directed by your health care provider. If you require home oxygen therapy, ask your health care provider whether you should purchase a pulse oximeter to measure your oxygen level at home.  Avoid contact with individuals who have a contagious illness.  Avoid extreme temperature and humidity changes.  Eat healthy foods. Eating smaller, more frequent meals and resting before meals may help you maintain your strength.  Stay active, but balance activity with periods of rest. Exercise and physical activity will help you maintain your ability to do things you want to do.  Preventing infection and hospitalization is very important when you have COPD. Make sure to receive all the vaccines your health care provider recommends, especially the pneumococcal and influenza vaccines. Ask your health care provider whether you need a pneumonia vaccine.  Learn and use relaxation techniques to manage stress.  Learn and use controlled breathing techniques as directed by your health care provider. Controlled breathing techniques include: 1. Pursed lip breathing. Start by breathing in (inhaling)   through your nose for 1 second. Then, purse your lips as  if you were going to whistle and breathe out (exhale) through the pursed lips for 2 seconds. 2. Diaphragmatic breathing. Start by putting one hand on your abdomen just above your waist. Inhale slowly through your nose. The hand on your abdomen should move out. Then purse your lips and exhale slowly. You should be able to feel the hand on your abdomen moving in as you exhale.  Learn and use controlled coughing to clear mucus from your lungs. Controlled coughing is a series of short, progressive coughs. The steps of controlled coughing are: 1. Lean your head slightly forward. 2. Breathe in deeply using diaphragmatic breathing. 3. Try to hold your breath for 3 seconds. 4. Keep your mouth slightly open while coughing twice. 5. Spit any mucus out into a tissue. 6. Rest and repeat the steps once or twice as needed. Contact a health care provider if:  You are coughing up more mucus than usual.  There is a change in the color or thickness of your mucus.  Your breathing is more labored than usual.  Your breathing is faster than usual. Get help right away if:  You have shortness of breath while you are resting.  You have shortness of breath that prevents you from:  Being able to talk.  Performing your usual physical activities.  You have chest pain lasting longer than 5 minutes.  Your skin color is more cyanotic than usual.  You measure low oxygen saturations for longer than 5 minutes with a pulse oximeter. This information is not intended to replace advice given to you by your health care provider. Make sure you discuss any questions you have with your health care provider. Document Released: 04/16/2005 Document Revised: 12/13/2015 Document Reviewed: 03/03/2013 Elsevier Interactive Patient Education  2017 [employer]  

## 2017-01-09 LAB — LIPID PANEL
CHOLESTEROL: 164 mg/dL (ref <200)
HDL: 49 mg/dL (ref >40)
LDL CALC: 84 mg/dL (ref <100)
TRIGLYCERIDES: 155 mg/dL — AB (ref <150)
Total CHOL/HDL Ratio: 3.3 Ratio (ref <5.0)
VLDL: 31 mg/dL — ABNORMAL HIGH (ref <30)

## 2017-01-09 LAB — HEMOGLOBIN A1C
Hgb A1c MFr Bld: 5.7 % — ABNORMAL HIGH (ref <5.7)
Mean Plasma Glucose: 117 mg/dL

## 2017-01-09 LAB — HEPATIC FUNCTION PANEL
ALBUMIN: 4 g/dL (ref 3.6–5.1)
ALT: 12 U/L (ref 9–46)
AST: 13 U/L (ref 10–35)
Alkaline Phosphatase: 58 U/L (ref 40–115)
Bilirubin, Direct: 0.1 mg/dL (ref <=0.2)
Indirect Bilirubin: 0.6 mg/dL (ref 0.2–1.2)
TOTAL PROTEIN: 6.3 g/dL (ref 6.1–8.1)
Total Bilirubin: 0.7 mg/dL (ref 0.2–1.2)

## 2017-01-09 LAB — BASIC METABOLIC PANEL WITH GFR
BUN: 13 mg/dL (ref 7–25)
CALCIUM: 9.4 mg/dL (ref 8.6–10.3)
CO2: 25 mmol/L (ref 20–31)
CREATININE: 0.87 mg/dL (ref 0.70–1.18)
Chloride: 103 mmol/L (ref 98–110)
GFR, Est Non African American: 83 mL/min (ref >=60)
GLUCOSE: 75 mg/dL (ref 65–99)
Potassium: 4.2 mmol/L (ref 3.5–5.3)
Sodium: 138 mmol/L (ref 135–146)

## 2017-01-09 LAB — MAGNESIUM: MAGNESIUM: 2.1 mg/dL (ref 1.5–2.5)

## 2017-03-01 ENCOUNTER — Other Ambulatory Visit: Payer: Self-pay | Admitting: Internal Medicine

## 2017-03-19 ENCOUNTER — Ambulatory Visit (INDEPENDENT_AMBULATORY_CARE_PROVIDER_SITE_OTHER): Payer: Medicare Other | Admitting: Cardiology

## 2017-03-19 ENCOUNTER — Encounter: Payer: Self-pay | Admitting: Cardiology

## 2017-03-19 VITALS — BP 96/56 | HR 56 | Ht 70.5 in | Wt 194.0 lb

## 2017-03-19 DIAGNOSIS — I700 Atherosclerosis of aorta: Secondary | ICD-10-CM

## 2017-03-19 DIAGNOSIS — I2510 Atherosclerotic heart disease of native coronary artery without angina pectoris: Secondary | ICD-10-CM | POA: Diagnosis not present

## 2017-03-19 DIAGNOSIS — E785 Hyperlipidemia, unspecified: Secondary | ICD-10-CM

## 2017-03-19 DIAGNOSIS — Z9861 Coronary angioplasty status: Secondary | ICD-10-CM

## 2017-03-19 DIAGNOSIS — I10 Essential (primary) hypertension: Secondary | ICD-10-CM

## 2017-03-19 NOTE — Patient Instructions (Signed)
NO CHANGE WITH CURRENT MEDICATIONS   Your physician wants you to follow-up in 12 MONTHS WITH DR HARDING. You will receive a reminder letter in the mail two months in advance. If you don't receive a letter, please call our office to schedule the follow-up appointment.     If you need a refill on your cardiac medications before your next appointment, please call your pharmacy.  

## 2017-03-19 NOTE — Progress Notes (Signed)
PCP: [name], MD  Clinic Note: Chief Complaint  Patient presents with  . Follow-up    no chest pain, shortness of breath, edema, pain or cramping in legs-occassional due to back surgery, lightheaded or dizziness  . Coronary Artery Disease    HPI: David [name] is a 81 y.o. male with a PMH below who presents today for annual f/u - CAD-PCI & CRFs. He is a former patient Dr. Aldona Bar.  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 He had a relook catheterization in February 2012 identified a moderate lesion proximal to the LAD stent. This was evaluated with an FFR that was 0.82 with medical management and he has done well since.  David [name] was last seen on 03/19/2016: He was doing relatively well from a cardiac standpoint which is having back issues. He had a spinal cyst removal in January and July of last year. He noted less "get up and go". However he still did exercise and denids any cardiac symptoms.  Recent Hospitalizations: n/a  Studies Personally Reviewed - (if available, images/films reviewed: From Epic Chart or Care Everywhere)  n/a  Interval History: Edgardo continue to be relatively stable from a Cardiology standpoint.  He is limited by back pain & L leg weakness since his back surgeries, so he cannot do much exercise.  To the extent that he is active, he denies any resting or exertional chest discomfort.  No CHF symptoms of PND, orthopnea or edema.  No rapid or irregular heartbeats / rates.     No  lightheadedness, dizziness, weakness or syncope/near syncope. No TIA/amaurosis fugax symptoms. No claudication.  He says that he has no intention of quitting smoking - since he reached 4 score & 10 (70), he feels like he is living on borrowed time & wants to enjoy it.  ROS: A comprehensive was performed. Review of Systems  Constitutional: Negative for malaise/fatigue.  HENT: Negative  for nosebleeds.   Respiratory: Negative for cough (off & on cough - ? related to smoking & allergies), shortness of breath and wheezing.   Cardiovascular: Negative for claudication.  Gastrointestinal: Negative for blood in stool and melena.  Genitourinary: Negative for hematuria.  Musculoskeletal: Positive for back pain.       Hard time bending over  Skin: Negative.   Neurological: Positive for focal weakness (R leg - since back Sgx.). Negative for dizziness.       Radicular pain to R leg  Psychiatric/Behavioral: Negative for memory loss. The patient does not have insomnia.   All other systems reviewed and are negative.  I have reviewed and (if needed) personally updated the patient's problem list, medications, allergies, past medical and surgical history, social and family history.   Past Medical History:  Diagnosis Date  . Arthritis   . BPH (benign prostatic hyperplasia)   . CAD S/P percutaneous coronary angioplasty 12/2009; 08/2010   a) 6/'22 - NSTEMI: PCI to LAD: Promus Element 2.5 mm x 15 mm DES; b) Cath for Angina: Prox LAD 60-70% pre-stent with FFR 0.82, 90% RVM -- Med Rx, EF 50-55%  . Essential hypertension   . Hyperlipidemia with target LDL less than 70    Statin intolerance  . Non-ST elevation MI (NSTEMI) [facility]) June 2011   PCI to LAD  . Pneumonia    DEC 2016  [place] WITH ANTIBIOTIC  . Type 2 diabetes mellitus without complications ([place])  Past Surgical History:  Procedure Laterality Date  . CARDIAC CATHETERIZATION  12/2009   Proximal LAD stenosis followed by a significant 80-90% distal stenosis  . CARDIAC CATHETERIZATION  February 2012    90% ostial RV marginal branch; 60-70% proximal LAD with widely patent distal stent. FFR 0.82; medical therapy  . CATARACT EXTRACTION W/ INTRAOCULAR LENS  IMPLANT, BILATERAL    . CORONARY ANGIOPLASTY  12/2009   PTCA to proximal LAD; PCI with Promus Element DES stent  2.5 mm x 15 mm  distalLAD - .for non-ST elevation MI  . DENTAL SURGERY      . LUMBAR LAMINECTOMY/DECOMPRESSION MICRODISCECTOMY N/A 08/09/2015   Procedure: Lumbar Four-Five, Lumbar Five- Sacral One Decompressive Lumbar Laminectomy with Resection of Synovial Cyst;  Surgeon: Erline Levine, MD;  Location: [place] [facility];  Service: Neurosurgery;  Laterality: N/A;  L4 to S1 Decompressive Lumbar Laminectomy  . LUMBAR LAMINECTOMY/DECOMPRESSION MICRODISCECTOMY Right 01/21/2016   Procedure: Redo Right Lumbar Five-Sacral One Laminectomy for synovial cyst;  Surgeon: Erline Levine, MD;  Location: [place] [facility];  Service: Neurosurgery;  Laterality: Right;  right  . NASAL FRACTURE SURGERY    . TRANSTHORACIC ECHOCARDIOGRAM  June 2011   - (EF not reported) Moderately dilated LV; moderate hypokinesis of anteroseptal and anterior wall consistent with MI. Grade 1 diastolic dysfunction. Mild to moderately dilated LA  . VASECTOMY      Current Meds  Medication Sig  . aspirin EC 81 MG tablet Take 1 tablet (81 mg total) by mouth every other day. (Patient taking differently: Take 81 mg by mouth daily. )  . cholecalciferol (VITAMIN D) 1000 UNITS tablet Take 2,000 Units by mouth daily.   Marland Kitchen CINNAMON PO Take 1,000 mg by mouth daily.   . clopidogrel (PLAVIX) 75 MG tablet TAKE 1 TABLET BY MOUTH EVERY DAY  . doxazosin (CARDURA) 4 MG tablet TAKE 1 TABLET BY MOUTH AT BEDTIME  . ezetimibe (ZETIA) 10 MG tablet TAKE 1 TABLET BY MOUTH EVERY DAY  . finasteride (PROSCAR) 5 MG tablet Take 5 mg by mouth daily.  Marland Kitchen lisinopril (PRINIVIL,ZESTRIL) 5 MG tablet TAKE 1 TABLET BY MOUTH DAILY.  . Magnesium 500 MG CAPS Take 1 capsule by mouth daily.  . metFORMIN (GLUCOPHAGE-XR) 500 MG 24 hr tablet TAKE 2 TABLETS BY MOUTH TWICE DAILY    Allergies  Allergen Reactions  . Statins Other (See Comments)    Myalgias and cramping Both Crestor and Lipitor  . Tetanus Toxoids     Social History   Social History  . Marital status: Married    Spouse name: N/A  . Number of children: N/A  . Years of education: N/A   Social  History Main Topics  . Smoking status: Current Every Day Smoker    Years: 60.00    Types: Pipe  . Smokeless tobacco: Never Used     Comment: smokes pipe qd  . Alcohol use 4.2 oz/week    7 Standard drinks or equivalent per week     Comment: drinks 4 ounces of vodka daily  . Drug use: No  . Sexual activity: Not Asked   Other Topics Concern  . None   Social History Narrative   He is a married father of 2 with no grandchildren as of yet. He continued to smoke a pipe several times the course of day. He does have an occasional alcoholic beverage. While not involved a standard exercise routine, he is very active with walking and working in the yard, splitting wood and doing aggressive yard  work including mowing [employer]bal[employer].    family history includes Cancer in his brother, father, sister, and son; Stroke in his father.  Wt Readings from Last 3 Encounters:  03/19/17 194 lb (88 kg)  01/08/17 192 lb (87.1 kg)  09/24/16 193 lb (87.5 kg)    PHYSICAL EXAM BP (!) 96/56   Pulse (!) 56   Ht 5' 10.5" (1.791 m)   Wt 194 lb (88 kg)   BMI 27.44 kg/m  Physical Exam  Constitutional: He is oriented to person, place, and time. He appears well-developed and well-nourished. No distress.  Well-groomed.  HENT:  Head: Normocephalic and atraumatic.  Eyes: EOM are normal.  Neck: No hepatojugular reflux and no JVD present. Carotid bruit is not present.  Cardiovascular: Normal rate and regular rhythm.   Pulmonary/Chest: Effort normal and breath sounds normal. No respiratory distress. He has no wheezes. He has no rales.  Abdominal: Soft. Bowel sounds are normal. He exhibits no mass. There is no tenderness. There is no rebound and no guarding.  Musculoskeletal: Normal range of motion. He exhibits no edema or deformity.  Slow deliberate gait  Neurological: He is alert and oriented to person, place, and time.  Skin: Skin is warm and dry.  Psychiatric: He has a normal mood and affect. His  behavior is normal. Judgment and thought content normal.  Answers questions appropriately  Nursing note and vitals reviewed.   Adult ECG Report  Rate: 56 ;  Rhythm: sinus bradycardia, premature ventricular contractions (PVC) and LAFB (-67) vs. LAD with IVCD;   Narrative Interpretation: stable   Other studies Reviewed: Additional studies/ records that were reviewed today include:  Recent Labs:   Lab Results  Component Value Date   CREATININE 0.87 01/08/2017   BUN 13 01/08/2017   NA 138 01/08/2017   K 4.2 01/08/2017   CL 103 01/08/2017   CO2 25 01/08/2017   Lab Results  Component Value Date   CHOL 164 01/08/2017   HDL 49 01/08/2017   LDLCALC 84 01/08/2017   TRIG 155 (H) 01/08/2017   CHOLHDL 3.3 01/08/2017     ASSESSMENT / PLAN: Problem List Items Addressed This Visit    Aortic atherosclerosis ([place]) (Chronic)    Continue Cardiovascular risk modification - BP control.  Despite no statin, lipids are relatively well controlled. He is not interested in stopping smoking.      Relevant Orders   EKG 12-Lead (Completed)   CAD S/P percutaneous coronary angioplasty and PCI- DES mid LAD in the setting of non-STEMI - Primary (Chronic)    No active angina. ASA changed to every other day (his PCP wants him to keep taking). On Plavix & ace-I. No Beta Blocker due to fatigue & bradycardia (baseline conduction abnormality).  Not on statin due to intolerance.      Relevant Orders   EKG 12-Lead (Completed)   Essential hypertension (Chronic)    Borderline hypotensive on current ACE-I dose.   Monitor for Sx of hypotension - low threshold for cutting dose to 2.5 mg.      Hyperlipidemia with statin intolerance; goal LDL < 70 (Chronic)    Labs look better on Zetia.  I had started fenofibrate - but this was not tolerated either. He does not seem interested in trying other medications - as the only option would be PCSK9 inhibitor.         Current medicines are reviewed at length with  the patient today. (+/- concerns) n/a The following changes have  been made: n/a  Patient Instructions  NO CHANGE WITH CURRENT MEDICATIONS   Your physician wants you to follow-up in [place]. You will receive a reminder letter in the mail two months in advance. If you don't receive a letter, please call our office to schedule the follow-up appointment.   If you need a refill on your cardiac medications before your next appointment, please call your pharmacy.    Studies Ordered:   Orders Placed This Encounter  Procedures  . EKG 12-Lead      Glenetta Hew, M.D., M.S. Interventional Cardiologist   Pager # [phone] Phone # [phone] [address] [place], [place] [address]

## 2017-03-22 ENCOUNTER — Encounter: Payer: Self-pay | Admitting: Cardiology

## 2017-03-22 NOTE — Assessment & Plan Note (Signed)
Labs look better on Zetia.  I had started fenofibrate - but this was not tolerated either. He does not seem interested in trying other medications - as the only option would be PCSK9 inhibitor.

## 2017-03-22 NOTE — Assessment & Plan Note (Signed)
No active angina. ASA changed to every other day (his PCP wants him to keep taking). On Plavix & ace-I. No Beta Blocker due to fatigue & bradycardia (baseline conduction abnormality).  Not on statin due to intolerance.

## 2017-03-22 NOTE — Assessment & Plan Note (Signed)
Continue Cardiovascular risk modification - BP control.  Despite no statin, lipids are relatively well controlled. He is not interested in stopping smoking.

## 2017-03-22 NOTE — Assessment & Plan Note (Signed)
Borderline hypotensive on current ACE-I dose.   Monitor for Sx of hypotension - low threshold for cutting dose to 2.5 mg.

## 2017-03-24 ENCOUNTER — Other Ambulatory Visit: Payer: Self-pay | Admitting: Cardiology

## 2017-03-24 NOTE — Telephone Encounter (Signed)
Rx(s) sent to pharmacy electronically.  

## 2017-04-07 ENCOUNTER — Other Ambulatory Visit: Payer: Self-pay | Admitting: Physician Assistant

## 2017-04-09 ENCOUNTER — Other Ambulatory Visit: Payer: Self-pay | Admitting: Cardiology

## 2017-04-10 ENCOUNTER — Ambulatory Visit (INDEPENDENT_AMBULATORY_CARE_PROVIDER_SITE_OTHER): Payer: Medicare Other | Admitting: Internal Medicine

## 2017-04-10 ENCOUNTER — Encounter: Payer: Self-pay | Admitting: Internal Medicine

## 2017-04-10 VITALS — BP 114/76 | HR 68 | Temp 97.8°F | Resp 18 | Ht 70.5 in | Wt 194.4 lb

## 2017-04-10 DIAGNOSIS — E782 Mixed hyperlipidemia: Secondary | ICD-10-CM | POA: Diagnosis not present

## 2017-04-10 DIAGNOSIS — I2510 Atherosclerotic heart disease of native coronary artery without angina pectoris: Secondary | ICD-10-CM

## 2017-04-10 DIAGNOSIS — N182 Chronic kidney disease, stage 2 (mild): Secondary | ICD-10-CM

## 2017-04-10 DIAGNOSIS — Z9861 Coronary angioplasty status: Secondary | ICD-10-CM | POA: Diagnosis not present

## 2017-04-10 DIAGNOSIS — E1122 Type 2 diabetes mellitus with diabetic chronic kidney disease: Secondary | ICD-10-CM

## 2017-04-10 DIAGNOSIS — I10 Essential (primary) hypertension: Secondary | ICD-10-CM

## 2017-04-10 DIAGNOSIS — Z79899 Other long term (current) drug therapy: Secondary | ICD-10-CM | POA: Diagnosis not present

## 2017-04-10 DIAGNOSIS — Z23 Encounter for immunization: Secondary | ICD-10-CM | POA: Diagnosis not present

## 2017-04-10 DIAGNOSIS — E559 Vitamin D deficiency, unspecified: Secondary | ICD-10-CM

## 2017-04-10 NOTE — Patient Instructions (Signed)

## 2017-04-10 NOTE — Progress Notes (Signed)
This very nice 81 y.o. MWM presents for 6 month follow up with Hypertension, ASHD/PCA, T2_DM/CKD2, HLD  and Vitamin D Deficiency.      Patient is treated for HTN (2000) & BP has been controlled at home. Today's BP is at goal -  114/76. Patient underwent /DES in 2010 and a 2sd PCS /NSTEMI in 2011 and is followed closely by Dr Ellyn Hack. Patient has had no complaints of any cardiac type chest pain, palpitations, dyspnea/orthopnea/PND, dizziness, claudication, or dependent edema.     Patient is Statin Intolerant and his Hyperlipidemia is controlled with diet & Zetia. Patient denies myalgias or other med SE's. Last Lipids were at goal: Lab Results  Component Value Date   CHOL 164 01/08/2017   HDL 49 01/08/2017   LDLCALC 84 01/08/2017   TRIG 155 (H) 01/08/2017   CHOLHDL 3.3 01/08/2017      Also, the patient has history of T2_NIDDM w/CKD2 and is controlled well omn Metformin.  He  has had no symptoms of reactive hypoglycemia, diabetic polys, paresthesias or visual blurring.  Last A1c was near goal: Lab Results  Component Value Date   HGBA1C 5.7 (H) 01/08/2017      Further, the patient also has history of Vitamin D Deficiency ("22" / 2008)  and supplements vitamin D without any suspected side-effects. Last vitamin D was at goal: Lab Results  Component Value Date   VD25OH 60 09/24/2016   Current Outpatient Prescriptions on File Prior to Visit  Medication Sig  . aspirin EC 81 MG tablet Take 1 tablet (81 mg total) by mouth every other day. (Patient taking differently: Take 81 mg by mouth daily. )  . CINNAMON PO Take 1,000 mg by mouth daily.   . clopidogrel (PLAVIX) 75 MG tablet TAKE 1 TABLET BY MOUTH EVERY DAY  . doxazosin (CARDURA) 4 MG tablet TAKE 1 TABLET BY MOUTH AT BEDTIME  . ezetimibe (ZETIA) 10 MG tablet TAKE 1 TABLET BY MOUTH EVERY DAY  . finasteride (PROSCAR) 5 MG tablet Take 5 mg by mouth daily.  Marland Kitchen lisinopril (PRINIVIL,ZESTRIL) 5 MG tablet TAKE 1 TABLET BY MOUTH DAILY.  .  Magnesium 500 MG CAPS Take 1 capsule by mouth daily.  . metFORMIN (GLUCOPHAGE-XR) 500 MG 24 hr tablet TAKE 2 TABLETS BY MOUTH TWICE DAILY   No current facility-administered medications on file prior to visit.    Allergies  Allergen Reactions  . Statins Other (See Comments)    Myalgias and cramping Both Crestor and Lipitor  . Tetanus Toxoids    PMHx:   Past Medical History:  Diagnosis Date  . Arthritis   . BPH (benign prostatic hyperplasia)   . CAD S/P percutaneous coronary angioplasty 12/2009; 08/2010   a) 6/'22 - NSTEMI: PCI to LAD: Promus Element 2.5 mm x 15 mm DES; b) Cath for Angina: Prox LAD 60-70% pre-stent with FFR 0.82, 90% RVM -- Med Rx, EF 50-55%  . Essential hypertension   . Hyperlipidemia with target LDL less than 70    Statin intolerance  . Non-ST elevation MI (NSTEMI) [facility]) June 2011   PCI to LAD  . Pneumonia    DEC 2016  [place] WITH ANTIBIOTIC  . Type 2 diabetes mellitus without complications ([place])    Immunization History  Administered Date(s) Administered  . Influenza, High Dose Seasonal PF 06/01/2013, 04/18/2014, 05/23/2015, 05/20/2016, 04/10/2017  . Influenza-Unspecified 05/13/2011  . Pneumococcal Conjugate-13 04/18/2014  . Pneumococcal Polysaccharide-23 07/21/2002, 05/23/2015  . Td 07/21/2000   Past Surgical History:  Procedure Laterality Date  . CARDIAC CATHETERIZATION  12/2009   Proximal LAD stenosis followed by a significant 80-90% distal stenosis  . CARDIAC CATHETERIZATION  February 2012    90% ostial RV marginal branch; 60-70% proximal LAD with widely patent distal stent. FFR 0.82; medical therapy  . CATARACT EXTRACTION W/ INTRAOCULAR LENS  IMPLANT, BILATERAL    . CORONARY ANGIOPLASTY  12/2009   PTCA to proximal LAD; PCI with Promus Element DES stent  2.5 mm x 15 mm  distalLAD - .for non-ST elevation MI  . DENTAL SURGERY    . LUMBAR LAMINECTOMY/DECOMPRESSION MICRODISCECTOMY N/A 08/09/2015   Procedure: Lumbar Four-Five, Lumbar Five- Sacral One  Decompressive Lumbar Laminectomy with Resection of Synovial Cyst;  Surgeon: Erline Levine, MD;  Location: [place] [facility];  Service: Neurosurgery;  Laterality: N/A;  L4 to S1 Decompressive Lumbar Laminectomy  . LUMBAR LAMINECTOMY/DECOMPRESSION MICRODISCECTOMY Right 01/21/2016   Procedure: Redo Right Lumbar Five-Sacral One Laminectomy for synovial cyst;  Surgeon: Erline Levine, MD;  Location: [place] [facility];  Service: Neurosurgery;  Laterality: Right;  right  . NASAL FRACTURE SURGERY    . TRANSTHORACIC ECHOCARDIOGRAM  June 2011   - (EF not reported) Moderately dilated LV; moderate hypokinesis of anteroseptal and anterior wall consistent with MI. Grade 1 diastolic dysfunction. Mild to moderately dilated LA  . VASECTOMY     FHx:    Reviewed / unchanged  SHx:    Reviewed / unchanged  Systems Review:  Constitutional: Denies fever, chills, wt changes, headaches, insomnia, fatigue, night sweats, change in appetite. Eyes: Denies redness, blurred vision, diplopia, discharge, itchy, watery eyes.  ENT: Denies discharge, congestion, post nasal drip, epistaxis, sore throat, earache, hearing loss, dental pain, tinnitus, vertigo, sinus pain, snoring.  CV: Denies chest pain, palpitations, irregular heartbeat, syncope, dyspnea, diaphoresis, orthopnea, PND, claudication or edema. Respiratory: denies cough, dyspnea, DOE, pleurisy, hoarseness, laryngitis, wheezing.  Gastrointestinal: Denies dysphagia, odynophagia, heartburn, reflux, water brash, abdominal pain or cramps, nausea, vomiting, bloating, diarrhea, constipation, hematemesis, melena, hematochezia  or hemorrhoids. Genitourinary: Denies dysuria, frequency, urgency, nocturia, hesitancy, discharge, hematuria or flank pain. Musculoskeletal: Denies arthralgias, myalgias, stiffness, jt. swelling, pain, limping or strain/sprain.  Skin: Denies pruritus, rash, hives, warts, acne, eczema or change in skin lesion(s). Neuro: No weakness, tremor, incoordination, spasms,  paresthesia or pain. Psychiatric: Denies confusion, memory loss or sensory loss. Endo: Denies change in weight, skin or hair change.  Heme/Lymph: No excessive bleeding, bruising or enlarged lymph nodes.  Physical Exam  BP 114/76   Pulse 68   Temp 97.8 F (36.6 C)   Resp 18   Ht 5' 10.5" (1.791 m)   Wt 194 lb 6.4 oz (88.2 kg)   BMI 27.50 kg/m   Appears well nourished, well groomed  and in no distress.  Eyes: PERRLA, EOMs, conjunctiva no swelling or erythema. Sinuses: No frontal/maxillary tenderness ENT/Mouth: EAC's clear, TM's nl w/o erythema, bulging. Nares clear w/o erythema, swelling, exudates. Oropharynx clear without erythema or exudates. Oral hygiene is good. Tongue normal, non obstructing. Hearing intact.  Neck: Supple. Thyroid nl. Car 2+/2+ without bruits, nodes or JVD. Chest: Respirations nl with BS clear & equal w/o rales, rhonchi, wheezing or stridor.  Cor: Heart sounds normal w/ regular rate and rhythm without sig. murmurs, gallops, clicks or rubs. Peripheral pulses normal and equal  without edema.  Abdomen: Soft & bowel sounds normal. Non-tender w/o guarding, rebound, hernias, masses or organomegaly.  Lymphatics: Unremarkable.  Musculoskeletal: Full ROM all peripheral extremities, joint stability, 5/5 strength and normal gait.  Skin:  Warm, dry without exposed rashes, lesions or ecchymosis apparent.  Neuro: Cranial nerves intact, reflexes equal bilaterally. Sensory-motor testing grossly intact. Tendon reflexes grossly intact.  Pysch: Alert & oriented x 3.  Insight and judgement nl & appropriate. No ideations.  Assessment and Plan:  1. Essential hypertension  - Continue medication, monitor blood pressure at home.  - Continue DASH diet. Reminder to go to the ER if any CP,  SOB, nausea, dizziness, severe HA, changes vision/speech.  - CBC with Differential/Platelet - BASIC METABOLIC PANEL WITH GFR - Magnesium - TSH  2. Hyperlipidemia, mixed  - Continue  diet/meds, exercise,& lifestyle modifications.  - Continue monitor periodic cholesterol/liver & renal functions    - Hepatic function panel - Lipid panel - TSH  3. Type 2 diabetes mellitus with stage 2 chronic kidney disease, without long-term current use of insulin (HCC)  - Continue diet, exercise, lifestyle modifications.  - Monitor appropriate labs.  - Hemoglobin A1c - Insulin, fasting  4. Vitamin D deficiency  - Continue supplementation.  - VITAMIN D 25 Hydroxy  5. CAD S/P percutaneous coronary angioplasty and PCI- DES mid LAD in the setting of non-STEMI  - Lipid panel  6. Medication management  - CBC with Differential/Platelet - BASIC METABOLIC PANEL WITH GFR - Hepatic function panel - Magnesium - Lipid panel - TSH - Hemoglobin A1c - Insulin, fasting - VITAMIN D 25 Hydroxy  7. Need for immunization against influenza  - Flu vaccine HIGH DOSE PF (Fluzone High dose)          Discussed  regular exercise, BP monitoring, weight control to achieve/maintain BMI less than 25 and discussed med and SE's. Recommended labs to assess and monitor clinical status with further disposition pending results of labs. Over 30 minutes of exam, counseling, chart review was performed.

## 2017-04-13 LAB — HEPATIC FUNCTION PANEL
AG RATIO: 1.8 (calc) (ref 1.0–2.5)
ALT: 16 U/L (ref 9–46)
AST: 17 U/L (ref 10–35)
Albumin: 4.3 g/dL (ref 3.6–5.1)
Alkaline phosphatase (APISO): 55 U/L (ref 40–115)
BILIRUBIN DIRECT: 0.2 mg/dL (ref 0.0–0.2)
BILIRUBIN INDIRECT: 0.6 mg/dL (ref 0.2–1.2)
Globulin: 2.4 g/dL (calc) (ref 1.9–3.7)
TOTAL PROTEIN: 6.7 g/dL (ref 6.1–8.1)
Total Bilirubin: 0.8 mg/dL (ref 0.2–1.2)

## 2017-04-13 LAB — CBC WITH DIFFERENTIAL/PLATELET
BASOS ABS: 49 {cells}/uL (ref 0–200)
Basophils Relative: 0.6 %
EOS ABS: 336 {cells}/uL (ref 15–500)
Eosinophils Relative: 4.1 %
HCT: 46.2 % (ref 38.5–50.0)
Hemoglobin: 15.7 g/dL (ref 13.2–17.1)
Lymphs Abs: 1615 cells/uL (ref 850–3900)
MCH: 31.2 pg (ref 27.0–33.0)
MCHC: 34 g/dL (ref 32.0–36.0)
MCV: 91.7 fL (ref 80.0–100.0)
MONOS PCT: 12.8 %
MPV: 10.6 fL (ref 7.5–12.5)
NEUTROS ABS: 5150 {cells}/uL (ref 1500–7800)
Neutrophils Relative %: 62.8 %
Platelets: 230 10*3/uL (ref 140–400)
RBC: 5.04 10*6/uL (ref 4.20–5.80)
RDW: 12.6 % (ref 11.0–15.0)
TOTAL LYMPHOCYTE: 19.7 %
WBC: 8.2 10*3/uL (ref 3.8–10.8)
WBCMIX: 1050 {cells}/uL — AB (ref 200–950)

## 2017-04-13 LAB — BASIC METABOLIC PANEL WITH GFR
BUN: 14 mg/dL (ref 7–25)
CHLORIDE: 101 mmol/L (ref 98–110)
CO2: 27 mmol/L (ref 20–32)
CREATININE: 1.03 mg/dL (ref 0.70–1.18)
Calcium: 9.4 mg/dL (ref 8.6–10.3)
GFR, EST AFRICAN AMERICAN: 80 mL/min/{1.73_m2} (ref >=60)
GFR, Est Non African American: 69 mL/min/{1.73_m2} (ref >=60)
GLUCOSE: 87 mg/dL (ref 65–99)
Potassium: 4.5 mmol/L (ref 3.5–5.3)
SODIUM: 135 mmol/L (ref 135–146)

## 2017-04-13 LAB — HEMOGLOBIN A1C
Hgb A1c MFr Bld: 5.6 % of total Hgb (ref <5.7)
Mean Plasma Glucose: 114 (calc)
eAG (mmol/L): 6.3 (calc)

## 2017-04-13 LAB — LIPID PANEL
CHOL/HDL RATIO: 3.1 (calc) (ref <5.0)
CHOLESTEROL: 172 mg/dL (ref <200)
HDL: 56 mg/dL (ref >40)
LDL Cholesterol (Calc): 96 mg/dL (calc)
Non-HDL Cholesterol (Calc): 116 mg/dL (calc) (ref <130)
Triglycerides: 102 mg/dL (ref <150)

## 2017-04-13 LAB — VITAMIN D 25 HYDROXY (VIT D DEFICIENCY, FRACTURES): VIT D 25 HYDROXY: 55 ng/mL (ref 30–100)

## 2017-04-13 LAB — INSULIN, RANDOM: INSULIN: 4.6 u[IU]/mL (ref 2.0–19.6)

## 2017-04-13 LAB — TSH: TSH: 2.09 m[IU]/L (ref 0.40–4.50)

## 2017-04-13 LAB — MAGNESIUM: Magnesium: 2.1 mg/dL (ref 1.5–2.5)

## 2017-04-26 ENCOUNTER — Other Ambulatory Visit: Payer: Self-pay | Admitting: Internal Medicine

## 2017-04-30 DIAGNOSIS — N401 Enlarged prostate with lower urinary tract symptoms: Secondary | ICD-10-CM | POA: Diagnosis not present

## 2017-04-30 DIAGNOSIS — R351 Nocturia: Secondary | ICD-10-CM | POA: Diagnosis not present

## 2017-04-30 DIAGNOSIS — N5201 Erectile dysfunction due to arterial insufficiency: Secondary | ICD-10-CM | POA: Diagnosis not present

## 2017-05-26 ENCOUNTER — Telehealth: Payer: Self-pay

## 2017-05-26 NOTE — Telephone Encounter (Signed)
   [facility] Pre-operative Risk Assessment    Request for surgical clearance:  1. What type of surgery is being performed?   Colonscopy  2. When is this surgery scheduled?  06-23-17   3. Are there any medications that need to be held prior to surgery and how long? Plavix-3days before   4. Practice name and name of physician performing surgery?  [facility] Dr Oletta Lamas   5. What is your office phone and fax number?  [phone] fax [phone]   6. Anesthesia type (None, local, MAC, general) ? Not listed   Waylan Rocher 05/26/2017, 9:58 AM  _________________________________________________________________   (provider comments below)

## 2017-05-27 NOTE — Telephone Encounter (Signed)
    Chart reviewed as part of pre-operative protocol coverage. Patient was contacted 05/27/2017 in reference to pre-operative risk assessment for pending surgery as outlined below.  David [name] was last seen on 03/19/2017 by Dr. Ellyn Hack.  Since that day, David [name] has done well. He denies any chest discomfort or dyspnea. He works in his yard and is active around the house and can climb at least a flight of stairs without any exertional symptoms.   He will be stopping his Plavix for 3 days prior to the colonoscopy and will restart after the procedure when no bleeding. His stent was in 2011 with no subsequent cardiac interventions.   Therefore, based on ACC/AHA guidelines, the patient would be at acceptable risk for the planned procedure without further cardiovascular testing.   Daune Perch, NP 05/27/2017, 4:56 PM

## 2017-05-28 NOTE — Telephone Encounter (Signed)
Will fwd to Dr. Oletta Lamas / [facility] and will fax to their office as well.

## 2017-06-04 DIAGNOSIS — H40013 Open angle with borderline findings, low risk, bilateral: Secondary | ICD-10-CM | POA: Diagnosis not present

## 2017-06-04 DIAGNOSIS — H01003 Unspecified blepharitis right eye, unspecified eyelid: Secondary | ICD-10-CM | POA: Diagnosis not present

## 2017-06-04 DIAGNOSIS — H02833 Dermatochalasis of right eye, unspecified eyelid: Secondary | ICD-10-CM | POA: Diagnosis not present

## 2017-06-04 DIAGNOSIS — S0011XA Contusion of right eyelid and periocular area, initial encounter: Secondary | ICD-10-CM | POA: Diagnosis not present

## 2017-06-23 DIAGNOSIS — Z86010 Personal history of colonic polyps: Secondary | ICD-10-CM | POA: Diagnosis not present

## 2017-06-23 DIAGNOSIS — K640 First degree hemorrhoids: Secondary | ICD-10-CM | POA: Diagnosis not present

## 2017-06-23 DIAGNOSIS — K635 Polyp of colon: Secondary | ICD-10-CM | POA: Diagnosis not present

## 2017-06-23 LAB — HM COLONOSCOPY

## 2017-06-25 DIAGNOSIS — K635 Polyp of colon: Secondary | ICD-10-CM | POA: Diagnosis not present

## 2017-07-13 NOTE — Progress Notes (Signed)
FOLLOW UP  Assessment and Plan:   Hypertension Well controlled with current medications  Monitor blood pressure at home; patient to call if consistently greater than 130/80 Continue DASH diet.   Reminder to go to the ER if any CP, SOB, nausea, dizziness, severe HA, changes vision/speech, left arm numbness and tingling and jaw pain.  Atherosclerosis of aorta Control blood pressure, cholesterol, glucose, increase exercise.    Cholesterol Statin intolerant; well managed by lifestyle and zetia Continue low cholesterol diet and exercise.  Check lipid panel.   Diabetes with diabetic nephropathy Currently well controlled by lifestyle and metformin with nearly normalized A1Cs Continue diet and exercise.  Perform daily foot/skin check, notify office of any concerning changes.  Check A1C  Obesity with co morbidities Long discussion about weight loss, diet, and exercise Recommended diet heavy in fruits and veggies and low in animal meats, cheeses, and dairy products, appropriate calorie intake Discussed ideal weight for height Will follow up in 3 months  Vitamin D Def/ osteoporosis prevention Continue supplementation Check Vit D level  Tobacco use Discussed risks associated with tobacco use and advised to reduce or quit Patient is not ready to do so, but advised to consider strongly Will follow up at the next visit  Continue diet and meds as discussed. Further disposition pending results of labs. Discussed med's effects and SE's.   Over 30 minutes of exam, counseling, chart review, and critical decision making was performed.   Future Appointments  Date Time Provider [place]  10/22/2017 11:00 AM [name], MD [facility] None    ----------------------------------------------------------------------------------------------------------------------  HPI 81 y.o. male  presents for 3 month follow up on hypertension, cholesterol (LDL goal <70, statin intolerant), T2DM  with nephropathy, weight and vitamin D deficiency. He continues to follow up with Dr. Vertell Limber (next visit 08/10/2017) for recurrent spinal cysts with removals in 2016, 2017. He feels these may be recurring due to recurrence of intermittent sciatic pain.   he currently continues to smoke pipes daily; discussed risks associated with smoking, patient is not ready to quit.   BMI is Body mass index is 27.73 kg/m., he has been working on diet and exercise. Wt Readings from Last 3 Encounters:  07/15/17 196 lb (88.9 kg)  04/10/17 194 lb 6.4 oz (88.2 kg)  03/19/17 194 lb (88 kg)   His blood pressure has been controlled at home, today their BP is BP: (!) 98/58  He does workout - walking, yardwork. He denies chest pain, shortness of breath, dizziness.   He is on cholesterol medication and denies myalgias. His cholesterol is fairly controlled but is not at goal (LDL <70). He is statin intolerant. The cholesterol last visit was:   Lab Results  Component Value Date   CHOL 172 04/10/2017   HDL 56 04/10/2017   LDLCALC 84 01/08/2017   TRIG 102 04/10/2017   CHOLHDL 3.1 04/10/2017    He has been working on diet and exercise for well controlled T2DM, and denies increased appetite, nausea, paresthesia of the feet, polydipsia, polyuria, visual disturbances and vomiting. Last A1C in the office was:  Lab Results  Component Value Date   HGBA1C 5.6 04/10/2017   Patient is on Vitamin D supplement but remained below goal at the last check:    Lab Results  Component Value Date   VD25OH 55 04/10/2017        Current Medications:  Current Outpatient Medications on File Prior to Visit  Medication Sig  . aspirin EC 81 MG tablet Take  1 tablet (81 mg total) by mouth every other day. (Patient taking differently: Take 81 mg by mouth daily. )  . Cholecalciferol (VITAMIN D) 2000 units CAPS Take 2 capsules by mouth.  Marland Kitchen CINNAMON PO Take 1,000 mg by mouth daily.   . clopidogrel (PLAVIX) 75 MG tablet TAKE 1 TABLET BY  MOUTH EVERY DAY  . doxazosin (CARDURA) 4 MG tablet TAKE 1 TABLET BY MOUTH AT BEDTIME  . ezetimibe (ZETIA) 10 MG tablet TAKE 1 TABLET BY MOUTH EVERY DAY  . finasteride (PROSCAR) 5 MG tablet Take 5 mg by mouth daily.  Marland Kitchen lisinopril (PRINIVIL,ZESTRIL) 5 MG tablet TAKE 1 TABLET BY MOUTH DAILY.  . Magnesium 500 MG CAPS Take 1 capsule by mouth daily.  . metFORMIN (GLUCOPHAGE-XR) 500 MG 24 hr tablet TAKE 2 TABLETS BY MOUTH TWICE DAILY   No current facility-administered medications on file prior to visit.      Allergies:  Allergies  Allergen Reactions  . Statins Other (See Comments)    Myalgias and cramping Both Crestor and Lipitor  . Tetanus Toxoids      Medical History:  Past Medical History:  Diagnosis Date  . Arthritis   . BPH (benign prostatic hyperplasia)   . CAD S/P percutaneous coronary angioplasty 12/2009; 08/2010   a) 6/'22 - NSTEMI: PCI to LAD: Promus Element 2.5 mm x 15 mm DES; b) Cath for Angina: Prox LAD 60-70% pre-stent with FFR 0.82, 90% RVM -- Med Rx, EF 50-55%  . Essential hypertension   . Hyperlipidemia with target LDL less than 70    Statin intolerance  . Non-ST elevation MI (NSTEMI) [facility]) June 2011   PCI to LAD  . Pneumonia    DEC 2016  TX WITH ANTIBIOTIC  . Type 2 diabetes mellitus without complications (HCC)    Family history- Reviewed and unchanged Social history- Reviewed and unchanged   Review of Systems:  Review of Systems  Constitutional: Negative for malaise/fatigue and weight loss.  HENT: Negative for hearing loss and tinnitus.   Eyes: Negative for blurred vision and double vision.  Respiratory: Negative for cough, shortness of breath and wheezing.   Cardiovascular: Negative for chest pain, palpitations, orthopnea, claudication and leg swelling.  Gastrointestinal: Negative for abdominal pain, blood in stool, constipation, diarrhea, heartburn, melena, nausea and vomiting.  Genitourinary: Negative.   Musculoskeletal: Negative for joint pain and  myalgias.  Skin: Negative for rash.  Neurological: Negative for dizziness, tingling, sensory change, weakness and headaches.  Endo/Heme/Allergies: Negative for polydipsia.  Psychiatric/Behavioral: Negative.   All other systems reviewed and are negative.     Physical Exam: BP (!) 98/58   Pulse 68   Temp (!) 97.2 F (36.2 C)   Ht 5' 10.5" (1.791 m)   Wt 196 lb (88.9 kg)   SpO2 98%   BMI 27.73 kg/m  Wt Readings from Last 3 Encounters:  07/15/17 196 lb (88.9 kg)  04/10/17 194 lb 6.4 oz (88.2 kg)  03/19/17 194 lb (88 kg)   General Appearance: Well nourished, in no apparent distress. Eyes: PERRLA, EOMs, conjunctiva no swelling or erythema Sinuses: No Frontal/maxillary tenderness ENT/Mouth: Ext aud canals clear, TMs without erythema, bulging. No erythema, swelling, or exudate on post pharynx.  Tonsils not swollen or erythematous. Hearing normal with bilateral hearing aids.   Neck: Supple, thyroid normal.  Respiratory: Respiratory effort normal, BS equal bilaterally without rales, rhonchi, wheezing or stridor.  Cardio: RRR with no MRGs. 1+ peripheral pulses without edema.  Abdomen: Soft, + BS.  Non tender,  no guarding, rebound, hernias, masses. Lymphatics: Non tender without lymphadenopathy.  Musculoskeletal: Full ROM, 5/5 strength, Normal gait Skin: Warm, dry without rashes, lesions, ecchymosis.  Neuro: Cranial nerves intact. No cerebellar symptoms.  Psych: Awake and oriented X 3, normal affect, Insight and Judgment appropriate.    Izora Ribas, NP 9:56 AM [facility]

## 2017-07-15 ENCOUNTER — Ambulatory Visit (INDEPENDENT_AMBULATORY_CARE_PROVIDER_SITE_OTHER): Payer: Medicare Other | Admitting: Adult Health

## 2017-07-15 ENCOUNTER — Encounter: Payer: Self-pay | Admitting: Adult Health

## 2017-07-15 VITALS — BP 98/58 | HR 68 | Temp 97.2°F | Ht 70.5 in | Wt 196.0 lb

## 2017-07-15 DIAGNOSIS — E559 Vitamin D deficiency, unspecified: Secondary | ICD-10-CM | POA: Diagnosis not present

## 2017-07-15 DIAGNOSIS — Z79899 Other long term (current) drug therapy: Secondary | ICD-10-CM

## 2017-07-15 DIAGNOSIS — I700 Atherosclerosis of aorta: Secondary | ICD-10-CM | POA: Diagnosis not present

## 2017-07-15 DIAGNOSIS — E1121 Type 2 diabetes mellitus with diabetic nephropathy: Secondary | ICD-10-CM | POA: Diagnosis not present

## 2017-07-15 DIAGNOSIS — I10 Essential (primary) hypertension: Secondary | ICD-10-CM | POA: Diagnosis not present

## 2017-07-15 DIAGNOSIS — F17200 Nicotine dependence, unspecified, uncomplicated: Secondary | ICD-10-CM | POA: Diagnosis not present

## 2017-07-15 DIAGNOSIS — E785 Hyperlipidemia, unspecified: Secondary | ICD-10-CM

## 2017-07-15 NOTE — Patient Instructions (Signed)
Exercising to Stay Healthy Exercising regularly is important. It has many health benefits, such as:  Improving your overall fitness, flexibility, and endurance.  Increasing your bone density.  Helping with weight control.  Decreasing your body fat.  Increasing your muscle strength.  Reducing stress and tension.  Improving your overall health.  In order to become healthy and stay healthy, it is recommended that you do moderate-intensity and vigorous-intensity exercise. You can tell that you are exercising at a moderate intensity if you have a higher heart rate and faster breathing, but you are still able to hold a conversation. You can tell that you are exercising at a vigorous intensity if you are breathing much harder and faster and cannot hold a conversation while exercising. How often should I exercise? Choose an activity that you enjoy and set realistic goals. Your health care provider can help you to make an activity plan that works for you. Exercise regularly as directed by your health care provider. This may include:  Doing resistance training twice each week, such as: ? Push-ups. ? Sit-ups. ? Lifting weights. ? Using resistance bands.  Doing a given intensity of exercise for a given amount of time. Choose from these options: ? 150 minutes of moderate-intensity exercise every week. ? 75 minutes of vigorous-intensity exercise every week. ? A mix of moderate-intensity and vigorous-intensity exercise every week.  Children, pregnant women, people who are out of shape, people who are overweight, and older adults may need to consult a health care provider for individual recommendations. If you have any sort of medical condition, be sure to consult your health care provider before starting a new exercise program. What are some exercise ideas? Some moderate-intensity exercise ideas include:  Walking at a rate of 1 mile in 15  minutes.  Biking.  Hiking.  Golfing.  Dancing.  Some vigorous-intensity exercise ideas include:  Walking at a rate of at least 4.5 miles per hour.  Jogging or running at a rate of 5 miles per hour.  Biking at a rate of at least 10 miles per hour.  Lap swimming.  Roller-skating or in-line skating.  Cross-country skiing.  Vigorous competitive sports, such as football, basketball, and soccer.  Jumping rope.  Aerobic dancing.  What are some everyday activities that can help me to get exercise?  [place] work, such as: ? Pushing a Conservation officer, nature. ? Raking and bagging leaves.  Washing and waxing your car.  Pushing a stroller.  Shoveling snow.  Gardening.  Washing windows or floors. How can I be more active in my day-to-day activities?  Use the stairs instead of the elevator.  Take a walk during your lunch break.  If you drive, park your car farther away from work or school.  If you take public transportation, get off one stop early and walk the rest of the way.  Make all of your phone calls while standing up and walking around.  Get up, stretch, and walk around every 30 minutes throughout the day. What guidelines should I follow while exercising?  Do not exercise so much that you hurt yourself, feel dizzy, or get very short of breath.  Consult your health care provider before starting a new exercise program.  Wear comfortable clothes and shoes with good support.  Drink plenty of water while you exercise to prevent dehydration or heat stroke. Body water is lost during exercise and must be replaced.  Work out until you breathe faster and your heart beats faster. This information is  not intended to replace advice given to you by your health care provider. Make sure you discuss any questions you have with your health care provider. Document Released: 08/09/2010 Document Revised: 12/13/2015 Document Reviewed: 12/08/2013 Elsevier Interactive Patient Education  [employer].   Here is some information to help you keep your heart healthy: Move it! - Aim for 30 mins of activity every day. Take it slowly at first. Talk to [place] before starting any new exercise program.   Lose it.  -Body Mass Index (BMI) can indicate if you need to lose weight. A healthy range is 18.5-24.9. For a BMI calculator, go to [employer].com  Waist Management -Excess abdominal fat is a risk factor for heart disease, diabetes, asthma, stroke and more. Ideal waist circumference is less than 35" for women and less than 40" for men.   Eat Right -focus on fruits, vegetables, whole grains, and meals you make yourself. Avoid foods with trans fat and high sugar/sodium content.   Snooze or Snore? - Loud snoring can be a sign of sleep apnea, a significant risk factor for high blood pressure, heart attach, stroke, and heart arrhythmias.  Kick the habit -Quit Smoking! Avoid second hand smoke. A single cigarette raises your blood pressure for 20 mins and increases the risk of heart attack and stroke for the next 24 hours.   Are Aspirin and Supplements right for you? -Add ENTERIC COATED low dose 81 mg Aspirin daily OR can do every other day if you have easy bruising to protect your heart and head. As well as to reduce risk of Colon Cancer by 20 %, Skin Cancer by 26 % , Melanoma by 46% and Pancreatic cancer by 60%  Say "No to Stress -There may be little you can do about problems that cause stress. However, techniques such as long walks, meditation, and exercise can help you manage it.   Start Now! - Make changes one at a time and set reasonable goals to increase your likelihood of success.

## 2017-07-16 LAB — BASIC METABOLIC PANEL WITH GFR
BUN: 10 mg/dL (ref 7–25)
CO2: 30 mmol/L (ref 20–32)
CREATININE: 0.83 mg/dL (ref 0.70–1.18)
Calcium: 9.5 mg/dL (ref 8.6–10.3)
Chloride: 103 mmol/L (ref 98–110)
GFR, EST NON AFRICAN AMERICAN: 84 mL/min/{1.73_m2} (ref >=60)
GFR, Est African American: 97 mL/min/{1.73_m2} (ref >=60)
Glucose, Bld: 92 mg/dL (ref 65–99)
Potassium: 4.4 mmol/L (ref 3.5–5.3)
SODIUM: 139 mmol/L (ref 135–146)

## 2017-07-16 LAB — HEPATIC FUNCTION PANEL
AG RATIO: 2 (calc) (ref 1.0–2.5)
ALKALINE PHOSPHATASE (APISO): 57 U/L (ref 40–115)
ALT: 14 U/L (ref 9–46)
AST: 14 U/L (ref 10–35)
Albumin: 4.1 g/dL (ref 3.6–5.1)
BILIRUBIN TOTAL: 0.6 mg/dL (ref 0.2–1.2)
Bilirubin, Direct: 0.1 mg/dL (ref 0.0–0.2)
GLOBULIN: 2.1 g/dL (ref 1.9–3.7)
Indirect Bilirubin: 0.5 mg/dL (calc) (ref 0.2–1.2)
Total Protein: 6.2 g/dL (ref 6.1–8.1)

## 2017-07-16 LAB — CBC WITH DIFFERENTIAL/PLATELET
BASOS ABS: 64 {cells}/uL (ref 0–200)
BASOS PCT: 0.8 %
EOS ABS: 424 {cells}/uL (ref 15–500)
EOS PCT: 5.3 %
HEMATOCRIT: 45.1 % (ref 38.5–50.0)
HEMOGLOBIN: 15.6 g/dL (ref 13.2–17.1)
LYMPHS ABS: 1376 {cells}/uL (ref 850–3900)
MCH: 31.8 pg (ref 27.0–33.0)
MCHC: 34.6 g/dL (ref 32.0–36.0)
MCV: 92 fL (ref 80.0–100.0)
MPV: 11 fL (ref 7.5–12.5)
Monocytes Relative: 12.4 %
NEUTROS ABS: 5144 {cells}/uL (ref 1500–7800)
Neutrophils Relative %: 64.3 %
Platelets: 234 10*3/uL (ref 140–400)
RBC: 4.9 10*6/uL (ref 4.20–5.80)
RDW: 12.1 % (ref 11.0–15.0)
Total Lymphocyte: 17.2 %
WBC mixed population: 992 cells/uL — ABNORMAL HIGH (ref 200–950)
WBC: 8 10*3/uL (ref 3.8–10.8)

## 2017-07-16 LAB — LIPID PANEL
CHOL/HDL RATIO: 3.1 (calc) (ref <5.0)
Cholesterol: 173 mg/dL (ref <200)
HDL: 56 mg/dL (ref >40)
LDL CHOLESTEROL (CALC): 94 mg/dL
NON-HDL CHOLESTEROL (CALC): 117 mg/dL (ref <130)
TRIGLYCERIDES: 125 mg/dL (ref <150)

## 2017-07-16 LAB — VITAMIN D 25 HYDROXY (VIT D DEFICIENCY, FRACTURES): Vit D, 25-Hydroxy: 52 ng/mL (ref 30–100)

## 2017-07-16 LAB — TSH: TSH: 2.27 m[IU]/L (ref 0.40–4.50)

## 2017-07-16 LAB — HEMOGLOBIN A1C
Hgb A1c MFr Bld: 5.8 % of total Hgb — ABNORMAL HIGH (ref <5.7)
Mean Plasma Glucose: 120 (calc)
eAG (mmol/L): 6.6 (calc)

## 2017-08-10 DIAGNOSIS — M4316 Spondylolisthesis, lumbar region: Secondary | ICD-10-CM | POA: Diagnosis not present

## 2017-08-10 DIAGNOSIS — M545 Low back pain: Secondary | ICD-10-CM | POA: Diagnosis not present

## 2017-08-10 DIAGNOSIS — M5416 Radiculopathy, lumbar region: Secondary | ICD-10-CM | POA: Diagnosis not present

## 2017-08-10 DIAGNOSIS — Z6827 Body mass index (BMI) 27.0-27.9, adult: Secondary | ICD-10-CM | POA: Diagnosis not present

## 2017-08-10 DIAGNOSIS — M7130 Other bursal cyst, unspecified site: Secondary | ICD-10-CM | POA: Diagnosis not present

## 2017-08-10 DIAGNOSIS — M48062 Spinal stenosis, lumbar region with neurogenic claudication: Secondary | ICD-10-CM | POA: Diagnosis not present

## 2017-08-10 DIAGNOSIS — R030 Elevated blood-pressure reading, without diagnosis of hypertension: Secondary | ICD-10-CM | POA: Diagnosis not present

## 2017-08-13 ENCOUNTER — Encounter: Payer: Self-pay | Admitting: *Deleted

## 2017-08-18 DIAGNOSIS — M5416 Radiculopathy, lumbar region: Secondary | ICD-10-CM | POA: Diagnosis not present

## 2017-08-20 DIAGNOSIS — M5416 Radiculopathy, lumbar region: Secondary | ICD-10-CM | POA: Diagnosis not present

## 2017-08-20 DIAGNOSIS — M48061 Spinal stenosis, lumbar region without neurogenic claudication: Secondary | ICD-10-CM | POA: Diagnosis not present

## 2017-09-06 ENCOUNTER — Other Ambulatory Visit: Payer: Self-pay | Admitting: Internal Medicine

## 2017-09-07 DIAGNOSIS — M545 Low back pain: Secondary | ICD-10-CM | POA: Diagnosis not present

## 2017-09-07 DIAGNOSIS — M5416 Radiculopathy, lumbar region: Secondary | ICD-10-CM | POA: Diagnosis not present

## 2017-09-07 DIAGNOSIS — Z6827 Body mass index (BMI) 27.0-27.9, adult: Secondary | ICD-10-CM | POA: Diagnosis not present

## 2017-09-07 DIAGNOSIS — M7130 Other bursal cyst, unspecified site: Secondary | ICD-10-CM | POA: Diagnosis not present

## 2017-09-07 DIAGNOSIS — M48062 Spinal stenosis, lumbar region with neurogenic claudication: Secondary | ICD-10-CM | POA: Diagnosis not present

## 2017-09-08 ENCOUNTER — Other Ambulatory Visit: Payer: Self-pay | Admitting: Neurosurgery

## 2017-09-08 ENCOUNTER — Telehealth: Payer: Self-pay

## 2017-09-08 NOTE — Telephone Encounter (Signed)
   [facility] Pre-operative Risk Assessment    Request for surgical clearance:  1. What type of surgery is being performed? Right L3-4 Laminectomy for synovial cyst   2. When is this surgery scheduled? pending  3. What type of clearance is required (medical clearance vs. Pharmacy clearance to hold med vs. Both)? Medical and pharmacy  4. Are there any medications that need to be held prior to surgery and how long? Plavix and aspirin  5. Practice name and name of physician performing surgery? [facility]  6. What is your office phone and fax number? Phone # [phone] Fax # [phone]   7. Anesthesia type (None, local, MAC, general) ? unknown   Kathyrn Lass 09/08/2017, 4:17 PM  _________________________________________________________________   (provider comments below)

## 2017-09-09 NOTE — Telephone Encounter (Signed)
Pt caled back and scheduled appt with [facility] 09-15-17

## 2017-09-09 NOTE — Telephone Encounter (Signed)
   Primary Cardiologist:No primary care provider on file.  Chart reviewed as part of pre-operative protocol coverage. Because of David [name] past medical history and time since last visit, he/she will require a follow-up visit in order to better assess preoperative cardiovascular risk.Hehas not been seen by cardiology since August of 2018.   Pre-op covering staff: - Please schedule appointment and call patient to inform them. - Please contact requesting surgeon's office via preferred method (i.e, phone, fax) to inform them of need for appointment prior to surgery.  Jory Sims DNP, ANP, AACC   09/09/2017, 2:01 PM

## 2017-09-09 NOTE — Telephone Encounter (Signed)
Appt scheduled

## 2017-09-09 NOTE — Telephone Encounter (Signed)
Lm2cb-wife for appt with pre-op clearance

## 2017-09-15 ENCOUNTER — Ambulatory Visit (INDEPENDENT_AMBULATORY_CARE_PROVIDER_SITE_OTHER): Payer: Medicare Other | Admitting: Cardiology

## 2017-09-15 ENCOUNTER — Encounter: Payer: Self-pay | Admitting: Cardiology

## 2017-09-15 VITALS — BP 132/70 | HR 48 | Ht 70.5 in | Wt 195.8 lb

## 2017-09-15 DIAGNOSIS — Z01810 Encounter for preprocedural cardiovascular examination: Secondary | ICD-10-CM | POA: Diagnosis not present

## 2017-09-15 DIAGNOSIS — I10 Essential (primary) hypertension: Secondary | ICD-10-CM

## 2017-09-15 DIAGNOSIS — M7138 Other bursal cyst, other site: Secondary | ICD-10-CM | POA: Diagnosis not present

## 2017-09-15 DIAGNOSIS — F17200 Nicotine dependence, unspecified, uncomplicated: Secondary | ICD-10-CM | POA: Diagnosis not present

## 2017-09-15 DIAGNOSIS — I2510 Atherosclerotic heart disease of native coronary artery without angina pectoris: Secondary | ICD-10-CM

## 2017-09-15 DIAGNOSIS — E785 Hyperlipidemia, unspecified: Secondary | ICD-10-CM | POA: Diagnosis not present

## 2017-09-15 DIAGNOSIS — Z9861 Coronary angioplasty status: Secondary | ICD-10-CM

## 2017-09-15 NOTE — Progress Notes (Signed)
PCP: [name], MD  Clinic Note: Chief Complaint  Patient presents with  . Medical Clearance    Pt states he has no complaints   . Coronary Artery Disease    LAD PCI.  Moderate disease in LAD stent    HPI: David [name] is a 82 y.o. male with a PMH below who presents today for what amounts to be a 83-month follow-up/preop evaluation. PMH notable for: CAD-PCI & CRFs. He is a former patient Dr. Aldona Bar.  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  NSTEMI in June of 2011with PCI to the LAD & moderate residual CAD.   Relook catheterization in February 2012 identified a moderate lesion proximal to the LAD stent. FFR 0.82 --> medical management and he has done well since.  ABSALOM ARO was last seen on 03/19/2017: Doing relatively stable from a Cardiology standpoint.  He is limited by back pain & L leg weakness since his back surgeries, so he cannot do much exercise.  To the extent that he is active, he denies any resting or exertional chest discomfort.  No CHF symptoms of PND, orthopnea or edema.  No rapid or irregular heartbeats / rates. --  Less get-up & go.  Recent Hospitalizations: n/a  Studies Personally Reviewed - (if available, images/films reviewed: From Epic Chart or Care Everywhere)  n/a  Interval History: David [name] presents today for preop evaluation for upcoming back surgery with planned removal of synovial cyst located at L3-L4.  This is quite limiting to him with significant back pain or radicular pain/numbness down the right leg.  There is concerned that the cyst has been growing in size and therefore should not be delayed too much.  David [name] has not had any anginal symptoms.  He obviously has been less active over the last month or 2 than he had been in the past, but has not had any issues with chest tightness/pressure with rest or exertion.  Prior to his back bothering him, he was easily able to do 6-8 METs.  CHF symptoms of PND, orthopnea, or edema.   He says occasionally he may have some episodes with borderline low blood pressures in the 110 range, but no dramatic drops in blood pressures that lead to lead lightheadedness or dizziness.  No syncope/near syncope or TIA/amaurosis fugax.  No history of stroke  Is is diabetic but controlled on metformin with A1c of 5.8.  Normal renal function. Claudication He continues to assert that he has no intention of quitting smoking.  Discuss it, he has a funny joke that indicates he is not interested in talking about it.   ROS: A comprehensive was performed. Review of Systems  Constitutional: Negative for malaise/fatigue.  HENT: Positive for congestion. Negative for nosebleeds.   Respiratory: Negative for cough (off & on cough - ? related to smoking & allergies), shortness of breath and wheezing.   Cardiovascular: Negative for claudication.  Gastrointestinal: Negative for blood in stool and melena.  Genitourinary: Negative for hematuria.  Musculoskeletal: Positive for back pain.       Hard time bending over  Skin: Negative.   Neurological: Positive for focal weakness (R leg - since back Sgx.). Negative for dizziness.       Radicular pain to R leg -associated with numbness and loss of strength  Endo/Heme/Allergies: Positive for environmental allergies.  Psychiatric/Behavioral: Negative for memory loss. The patient does not have insomnia.   All other systems reviewed and are negative.  I have reviewed and (if  needed) personally updated the patient's problem list, medications, allergies, past medical and surgical history, social and family history.   Past Medical History:  Diagnosis Date  . Arthritis   . BPH (benign prostatic hyperplasia)   . CAD S/P percutaneous coronary angioplasty 12/2009; 08/2010   a) 6/'22 - NSTEMI: PCI to LAD: Promus Element 2.5 mm x 15 mm DES; b) Cath for Angina: Prox LAD 60-70% pre-stent with FFR 0.82, 90% RVM -- Med Rx, EF 50-55%  . Essential hypertension   .  Hyperlipidemia with target LDL less than 70    Statin intolerance  . Non-ST elevation MI (NSTEMI) [facility]) June 2011   PCI to LAD  . Pneumonia    DEC 2016  TX WITH ANTIBIOTIC  . Type 2 diabetes mellitus without complications [facility])     Past Surgical History:  Procedure Laterality Date  . CARDIAC CATHETERIZATION  12/2009   Proximal LAD stenosis followed by a significant 80-90% distal stenosis  . CARDIAC CATHETERIZATION  February 2012    90% ostial RV marginal branch; 60-70% proximal LAD with widely patent distal stent. FFR 0.82; medical therapy  . CATARACT EXTRACTION W/ INTRAOCULAR LENS  IMPLANT, BILATERAL    . CORONARY ANGIOPLASTY  12/2009   PTCA to proximal LAD; PCI with Promus Element DES stent  2.5 mm x 15 mm  distalLAD - .for non-ST elevation MI  . DENTAL SURGERY    . LUMBAR LAMINECTOMY/DECOMPRESSION MICRODISCECTOMY N/A 08/09/2015   Procedure: Lumbar Four-Five, Lumbar Five- Sacral One Decompressive Lumbar Laminectomy with Resection of Synovial Cyst;  Surgeon: Erline Levine, MD;  Location: [place] NEURO ORS;  Service: Neurosurgery;  Laterality: N/A;  L4 to S1 Decompressive Lumbar Laminectomy  . LUMBAR LAMINECTOMY/DECOMPRESSION MICRODISCECTOMY Right 01/21/2016   Procedure: Redo Right Lumbar Five-Sacral One Laminectomy for synovial cyst;  Surgeon: Erline Levine, MD;  Location: [place] NEUR[facility] ORS;  Service: Neurosurgery;  Laterality: Right;  right  . NASAL FRACTURE SURGERY    . TRANSTHORACIC ECHOCARDIOGRAM  June 2011   - (EF not reported) Moderately dilated LV; moderate hypokinesis of anteroseptal and anterior wall consistent with MI. Grade 1 diastolic dysfunction. Mild to moderately dilated LA  . VASECTOMY      Current Meds  Medication Sig  . aspirin EC 81 MG tablet Take 1 tablet (81 mg total) by mouth every other day. (Patient taking differently: Take 81 mg by mouth daily. )  . Cholecalciferol (VITAMIN D) 2000 units CAPS Take 2 capsules by mouth.  Marland Kitchen CINNAMON PO Take 1,000 mg by mouth daily.   .  clopidogrel (PLAVIX) 75 MG tablet TAKE 1 TABLET BY MOUTH EVERY DAY  . doxazosin (CARDURA) 4 MG tablet TAKE 1 TABLET BY MOUTH AT BEDTIME  . ezetimibe (ZETIA) 10 MG tablet TAKE 1 TABLET BY MOUTH EVERY DAY  . finasteride (PROSCAR) 5 MG tablet Take 5 mg by mouth daily.  Marland Kitchen lisinopril (PRINIVIL,ZESTRIL) 5 MG tablet TAKE 1 TABLET BY MOUTH DAILY.  . Magnesium 500 MG CAPS Take 1 capsule by mouth daily.  . metFORMIN (GLUCOPHAGE-XR) 500 MG 24 hr tablet TAKE 2 TABLETS BY MOUTH TWICE DAILY    Allergies  Allergen Reactions  . Statins Other (See Comments)    Myalgias and cramping Both Crestor and Lipitor  . Tetanus Toxoids    Social History   Tobacco Use  . Smoking status: Current Every Day Smoker    Years: 60.00    Types: Pipe  . Smokeless tobacco: Never Used  . Tobacco comment: smokes pipe qd  Substance Use Topics  .  Alcohol use: Yes    Alcohol/week: 4.2 oz    Types: 7 Standard drinks or equivalent per week    Comment: drinks 4 ounces of vodka daily  . Drug use: No   Social History   Social History Narrative   He is a married father of 2 with no grandchildren as of yet. He continued to smoke a pipe several times the course of day. He does have an occasional alcoholic beverage. While not involved a standard exercise routine, he is very active with walking and working in the yard, splitting wood and doing aggressive yard work including Production designer, theatre/television/film.   Family History family history includes Cancer in his brother, father, sister, and son; Stroke in his father.  Wt Readings from Last 3 Encounters:  09/15/17 195 lb 12.8 oz (88.8 kg)  07/15/17 196 lb (88.9 kg)  04/10/17 194 lb 6.4 oz (88.2 kg)    PHYSICAL EXAM BP 132/70 (BP Location: Left Arm, Patient Position: Sitting, Cuff Size: Normal)   Pulse (!) 48   Ht 5' 10.5" (1.791 m)   Wt 195 lb 12.8 oz (88.8 kg)   BMI 27.70 kg/m  Physical Exam  Constitutional: He is oriented to person, place, and time. He appears  well-developed and well-nourished. No distress.  Well-groomed.  HENT:  Head: Normocephalic and atraumatic.  Eyes: EOM are normal.  Neck: No hepatojugular reflux and no JVD present. Carotid bruit is not present.  Cardiovascular: Normal rate and regular rhythm.  Pulmonary/Chest: Effort normal and breath sounds normal. No respiratory distress. He has no wheezes. He has no rales.  Abdominal: Soft. Bowel sounds are normal. There is no tenderness. There is no rebound and no guarding.  Musculoskeletal: Normal range of motion. He exhibits no edema or deformity.  Slow deliberate gait  Neurological: He is alert and oriented to person, place, and time.  Skin: Skin is warm and dry.  Psychiatric: He has a normal mood and affect. His behavior is normal. Judgment and thought content normal.  Answers questions appropriately  Nursing note and vitals reviewed.   Adult ECG Report  Rate: 56 ;  Rhythm: sinus bradycardia, premature ventricular contractions (PVC) and LAFB (-67) vs. LAD with IVCD;   Narrative Interpretation: stable   Other studies Reviewed: Additional studies/ records that were reviewed today include:  Recent Labs:   Lab Results  Component Value Date   CREATININE 0.83 07/15/2017   BUN 10 07/15/2017   NA 139 07/15/2017   K 4.4 07/15/2017   CL 103 07/15/2017   CO2 30 07/15/2017   Lab Results  Component Value Date   CHOL 173 07/15/2017   HDL 56 07/15/2017   LDLCALC 84 01/08/2017   TRIG 125 07/15/2017   CHOLHDL 3.1 07/15/2017  LDL C, 94   ASSESSMENT / PLAN: Problem List Items Addressed This Visit    CAD S/P percutaneous coronary angioplasty and PCI- DES mid LAD in the setting of non-STEMI (Chronic)    No recurrent or active anginal symptoms.  He is now 7 years out from his most recent cath, doing well.  Stable. Remains on aspirin and Plavix with no bleeding issues. Not on beta-blocker due to fatigue and bradycardia with baseline conduction abnormality. On stable dose of  lisinopril. Not on statin due to intolerance and no intention of taking medication      Essential hypertension (Chronic)    Blood pressures seem to be well controlled today.  With his occasional low pressure readings, I will try  to avoid being with aggressive.  We previously talked about cutting the dose of lisinopril down to 2.5.  I think for now he is stable, but would not add any additional control.      Hyperlipidemia with statin intolerance; goal LDL < 70 (Chronic)    He never did start taking fenofibrate, continues to take Zetia with relatively well-controlled LDL of 84 back in the summer, up to 90 for now.  We talked about treatment options.  He is not interested in pursuing PCSK9 inhibitor.   He understands the risks involved, but much like his smoking, does not want to do anything to make him feel any worse.      Preop cardiovascular exam - Primary    David is relatively astigmatic from both a pursue ischemic evaluation as it would not change my management.  I would also not pursue  He has been active, but now limited by back pain.  His surgery is relatively urgent due to the nature of his symptoms and the growth of the cyst.  Based on [facility] and AHA guidelines, he would be considered to be low risk cardiac standpoint for a low risk surgery.  See below for details.  PREOPERATIVE CARDIAC RISK ASSESSMENT   Revised Cardiac Risk Index:  High Risk Surgery: no; back  Defined as Intraperitoneal, intrathoracic or suprainguinal vascular  Active CAD: no; despite having moderate disease, was not physiologically significant 2012, and no symptoms now.  CHF: no;   Cerebrovascular Disease: no;    Diabetes: yes; On Insulin: no -A1c 5.8 indicating adequate control.  CKD (Cr >~ 2): no; creatinine 0.83  Total: 0 Estimated Risk of Adverse Outcome: LOW RISK Estimated Risk of MI, PE, VF/VT (Cardiac Arrest), Complete Heart Block: <1 %   ACC/AHA Guidelines for "Clearance":  Step 1 - Need  for Emergency Surgery: No: but relatively urgent (would not want to delay)  If Yes - go straight to OR with perioperative surveillance  Step 2 - Active Cardiac Conditions (Unstable Angina, Decompensated HF, Significant  Arrhytmias - Complete HB, Mobitz II, Symptomatic VT or SVT, Severe Aortic Stenosis - mean gradient > 40 mmHg, Valve area < 1.0 cm2):   No: See above  If Yes - Evaluate & Treat per ACC/AHA Guidelines  Step 3 -  Low Risk Surgery: Yes  If Yes --> proceed to OR  If No --> Step 4  Step 4 - Functional Capacity >= 4 METS without symptoms: Limited by Back Pain-leg pain   If Yes --> proceed to OR  If No --> Step 5  Step 5 --  Clinical Risk Factors (CRF) - See above  3 or more: No:   If Yes -- assess Surgical Risk, --   (High Risk Non-cardiac), Intraabdominal or thoracic vascular surgery consider testing if it will change management.  Intermediate Risk: Proceed to OR with HR control, or consider testing if it will change management  1-2 or more CRFs: No:   If Yes -- assess Surgical Risk, --   (High Risk Non-cardiac), Intraabdominal or thoracic vascular surgery --> Proceed to OR, or consider testing if it will change management.  Intermediate Risk: Proceed to OR with HR control, or consider testing if it will change management  No CRFs: Yes  If Yes --> Proceed to OR        Synovial cyst of lumbar facet joint    Starting to cause issues with significant radiculopathy and pain in the right leg.  Also seems to be growing  in size.  Relatively necessary surgery.      Tobacco dependency (Chronic)    He is not interested at all in stopping.  Not even interested in discussing.         Current medicines are reviewed at length with the patient today. (+/- concerns) n/a The following changes have been made: n/a  Patient Instructions  Cleared for surgery    Your physician wants you to follow-up in: 6 months. You will receive a reminder letter in the mail  two months in advance. If you don't receive a letter, please call our office to schedule the follow-up appointment.    Studies Ordered:   No orders of the defined types were placed in this encounter.     Glenetta Hew, M.D., M.S. Interventional Cardiologist   Pager # [phone] Phone # [phone] [address] [place], [place] [address]

## 2017-09-15 NOTE — Patient Instructions (Addendum)
Cleared for surgery    Your physician wants you to follow-up in: 6 months. You will receive a reminder letter in the mail two months in advance. If you don't receive a letter, please call our office to schedule the follow-up appointment.

## 2017-09-16 ENCOUNTER — Encounter: Payer: Self-pay | Admitting: Cardiology

## 2017-09-16 DIAGNOSIS — Z01810 Encounter for preprocedural cardiovascular examination: Secondary | ICD-10-CM | POA: Insufficient documentation

## 2017-09-16 NOTE — Assessment & Plan Note (Signed)
Blood pressures seem to be well controlled today.  With his occasional low pressure readings, I will try to avoid being with aggressive.  We previously talked about cutting the dose of lisinopril down to 2.5.  I think for now he is stable, but would not add any additional control.

## 2017-09-16 NOTE — Assessment & Plan Note (Signed)
Starting to cause issues with significant radiculopathy and pain in the right leg.  Also seems to be growing in size.  Relatively necessary surgery.

## 2017-09-16 NOTE — Assessment & Plan Note (Signed)
He is not interested at all in stopping.  Not even interested in discussing.

## 2017-09-16 NOTE — Assessment & Plan Note (Signed)
He never did start taking fenofibrate, continues to take Zetia with relatively well-controlled LDL of 84 back in the summer, up to 90 for now.  We talked about treatment options.  He is not interested in pursuing PCSK9 inhibitor.   He understands the risks involved, but much like his smoking, does not want to do anything to make him feel any worse.

## 2017-09-16 NOTE — Assessment & Plan Note (Signed)
David [name] is relatively astigmatic from both a pursue ischemic evaluation as it would not change my management.  I would also not pursue  He has been active, but now limited by back pain.  His surgery is relatively urgent due to the nature of his symptoms and the growth of the cyst.  Based on [facility] and AHA guidelines, he would be considered to be low risk cardiac standpoint for a low risk surgery.  See below for details.  PREOPERATIVE CARDIAC RISK ASSESSMENT   Revised Cardiac Risk Index:  High Risk Surgery: no; back  Defined as Intraperitoneal, intrathoracic or suprainguinal vascular  Active CAD: no; despite having moderate disease, was not physiologically significant 2012, and no symptoms now.  CHF: no;   Cerebrovascular Disease: no;    Diabetes: yes; On Insulin: no -A1c 5.8 indicating adequate control.  CKD (Cr >~ 2): no; creatinine 0.83  Total: 0 Estimated Risk of Adverse Outcome: LOW RISK Estimated Risk of MI, PE, VF/VT (Cardiac Arrest), Complete Heart Block: <1 %   ACC/AHA Guidelines for "Clearance":  Step 1 - Need for Emergency Surgery: No: but relatively urgent (would not want to delay)  If Yes - go straight to OR with perioperative surveillance  Step 2 - Active Cardiac Conditions (Unstable Angina, Decompensated HF, Significant  Arrhytmias - Complete HB, Mobitz II, Symptomatic VT or SVT, Severe Aortic Stenosis - mean gradient > 40 mmHg, Valve area < 1.0 cm2):   No: See above  If Yes - Evaluate & Treat per ACC/AHA Guidelines  Step 3 -  Low Risk Surgery: Yes  If Yes --> proceed to OR  If No --> Step 4  Step 4 - Functional Capacity >= 4 METS without symptoms: Limited by Back Pain-leg pain   If Yes --> proceed to OR  If No --> Step 5  Step 5 --  Clinical Risk Factors (CRF) - See above  3 or more: No:   If Yes -- assess Surgical Risk, --   (High Risk Non-cardiac), Intraabdominal or thoracic vascular surgery consider testing if it will change  management.  Intermediate Risk: Proceed to OR with HR control, or consider testing if it will change management  1-2 or more CRFs: No:   If Yes -- assess Surgical Risk, --   (High Risk Non-cardiac), Intraabdominal or thoracic vascular surgery --> Proceed to OR, or consider testing if it will change management.  Intermediate Risk: Proceed to OR with HR control, or consider testing if it will change management  No CRFs: Yes  If Yes --> Proceed to OR

## 2017-09-16 NOTE — Assessment & Plan Note (Signed)
No recurrent or active anginal symptoms.  He is now 7 years out from his most recent cath, doing well.  Stable. Remains on aspirin and Plavix with no bleeding issues. Not on beta-blocker due to fatigue and bradycardia with baseline conduction abnormality. On stable dose of lisinopril. Not on statin due to intolerance and no intention of taking medication

## 2017-09-28 ENCOUNTER — Other Ambulatory Visit: Payer: Self-pay | Admitting: *Deleted

## 2017-09-28 ENCOUNTER — Other Ambulatory Visit: Payer: Self-pay

## 2017-09-28 MED ORDER — CLOPIDOGREL BISULFATE 75 MG PO TABS: 75.0000 mg | ORAL_TABLET | Freq: Every day | ORAL | 3 refills | Status: DC

## 2017-09-28 NOTE — Pre-Procedure Instructions (Signed)
David [name]  09/28/2017      [id number] - [place], [place] - [address] [place] [address] Phone: [phone] Fax: [phone]    Your procedure is scheduled on Thursday, October 08, 2017  Report to [facility] Admitting Entrance "A" at 11:30AM   Call this number if you have problems the morning of surgery:  [phone]   Remember:  Do not eat food or drink liquids after midnight.  Take these medicines the morning of surgery with A SIP OF WATER: Finasteride (PROSCAR)  Follow your doctor's instruction regarding Aspirin and Plavix  7 days before surgery (Mar. 14),  stop taking all Aspirins, Vitamins, Fish oils, and Herbal medications. Also stop all NSAIDS i.e. Advil, Ibuprofen, Motrin, Aleve, Anaprox, Naproxen, BC and Goody Powders.  How to Manage Your Diabetes Before and After Surgery  Why is it important to control my blood sugar before and after surgery? . Improving blood sugar levels before and after surgery helps healing and can limit problems. . A way of improving blood sugar control is eating a healthy diet by: o  Eating less sugar and carbohydrates o  Increasing activity/exercise o  Talking with your doctor about reaching your blood sugar goals . High blood sugars (greater than 180 mg/dL) can raise your risk of infections and slow your recovery, so you will need to focus on controlling your diabetes during the weeks before surgery. . Make sure that the doctor who takes care of your diabetes knows about your planned surgery including the date and location.  How do I manage my blood sugar before surgery? . Check your blood sugar at least 4 times a day, starting 2 days before surgery, to make sure that the level is not too high or low. o Check your blood sugar the morning of your surgery when you wake up and every 2 hours until you get to the Short Stay unit. . If your blood sugar is  less than 70 mg/dL, you will need to treat for low blood sugar: o Do not take insulin. o Treat a low blood sugar (less than 70 mg/dL) with  cup of clear juice (cranberry or apple), 4 glucose tablets, OR glucose gel. Recheck blood sugar in 15 minutes after treatment (to make sure it is greater than 70 mg/dL). If your blood sugar is not greater than 70 mg/dL on recheck, call [phone] o  for further instructions. . Report your blood sugar to the short stay nurse when you get to Short Stay.  . If you are admitted to the hospital after surgery: o Your blood sugar will be checked by the staff and you will probably be given insulin after surgery (instead of oral diabetes medicines) to make sure you have good blood sugar levels. o The goal for blood sugar control after surgery is 80-180 mg/dL.  WHAT DO I DO ABOUT MY DIABETES MEDICATION?  Marland Kitchen Do not take MetFORMIN (GLUCOPHAGE-XR) the morning of surgery.  . If your CBG is greater than 220 mg/dL, call us at [phone]   Do not wear jewelry.  Do not wear lotions, powders, colognes, or deodorant.  Do not shave 48 hours prior to surgery.  Men may shave face and neck.  Do not bring valuables to the hospital.  [facility] is not responsible for any belongings or valuables.  Contacts, dentures or bridgework may not be worn into surgery.  Leave  your suitcase in the car.  After surgery it may be brought to your room.  For patients admitted to the hospital, discharge time will be determined by your treatment team.  Patients discharged the day of surgery will not be allowed to drive home.   Special instructions:   [place]- Preparing For Surgery  Before surgery, you can play an important role. Because skin is not sterile, your skin needs to be as free of germs as possible. You can reduce the number of germs on your skin by washing with CHG (chlorahexidine gluconate) Soap before surgery.  CHG is an antiseptic cleaner which kills germs and bonds  with the skin to continue killing germs even after washing.  Please do not use if you have an allergy to CHG or antibacterial soaps. If your skin becomes reddened/irritated stop using the CHG.  Do not shave (including legs and underarms) for at least 48 hours prior to first CHG shower. It is OK to shave your face.  Please follow these instructions carefully.   1. Shower the NIGHT BEFORE SURGERY and the MORNING OF SURGERY with CHG.   2. If you chose to wash your hair, wash your hair first as usual with your normal shampoo.  3. After you shampoo, rinse your hair and body thoroughly to remove the shampoo.  4. Use CHG as you would any other liquid soap. You can apply CHG directly to the skin and wash gently with a scrungie or a clean washcloth.   5. Apply the CHG Soap to your body ONLY FROM THE NECK DOWN.  Do not use on open wounds or open sores. Avoid contact with your eyes, ears, mouth and genitals (private parts). Wash Face and genitals (private parts)  with your normal soap.  6. Wash thoroughly, paying special attention to the area where your surgery will be performed.  7. Thoroughly rinse your body with warm water from the neck down.  8. DO NOT shower/wash with your normal soap after using and rinsing off the CHG Soap.  9. Pat yourself dry with a CLEAN TOWEL.  10. Wear CLEAN PAJAMAS to bed the night before surgery, wear comfortable clothes the morning of surgery  11. Place CLEAN SHEETS on your bed the night of your first shower and DO NOT SLEEP WITH PETS.  Day of Surgery: Do not apply any deodorants/lotions. Please wear clean clothes to the hospital/surgery center.    Please read over the following fact sheets that you were given. Pain Booklet, Coughing and Deep Breathing, MRSA Information and Surgical Site Infection Prevention

## 2017-09-29 ENCOUNTER — Encounter (HOSPITAL_COMMUNITY): Payer: Self-pay

## 2017-09-29 ENCOUNTER — Encounter (HOSPITAL_COMMUNITY)
Admission: RE | Admit: 2017-09-29 | Discharge: 2017-09-29 | Disposition: A | Discharge status: Home / Self Care | Payer: Medicare Other | Source: Ambulatory Visit | Attending: Neurosurgery | Admitting: Neurosurgery

## 2017-09-29 ENCOUNTER — Other Ambulatory Visit: Payer: Self-pay

## 2017-09-29 DIAGNOSIS — I700 Atherosclerosis of aorta: Secondary | ICD-10-CM | POA: Diagnosis not present

## 2017-09-29 DIAGNOSIS — E559 Vitamin D deficiency, unspecified: Secondary | ICD-10-CM | POA: Insufficient documentation

## 2017-09-29 DIAGNOSIS — F17200 Nicotine dependence, unspecified, uncomplicated: Secondary | ICD-10-CM | POA: Insufficient documentation

## 2017-09-29 DIAGNOSIS — E785 Hyperlipidemia, unspecified: Secondary | ICD-10-CM | POA: Diagnosis not present

## 2017-09-29 DIAGNOSIS — M5416 Radiculopathy, lumbar region: Secondary | ICD-10-CM | POA: Insufficient documentation

## 2017-09-29 DIAGNOSIS — Z01812 Encounter for preprocedural laboratory examination: Secondary | ICD-10-CM | POA: Insufficient documentation

## 2017-09-29 DIAGNOSIS — M7138 Other bursal cyst, other site: Secondary | ICD-10-CM | POA: Diagnosis not present

## 2017-09-29 DIAGNOSIS — I2510 Atherosclerotic heart disease of native coronary artery without angina pectoris: Secondary | ICD-10-CM | POA: Insufficient documentation

## 2017-09-29 DIAGNOSIS — I10 Essential (primary) hypertension: Secondary | ICD-10-CM | POA: Insufficient documentation

## 2017-09-29 DIAGNOSIS — E1121 Type 2 diabetes mellitus with diabetic nephropathy: Secondary | ICD-10-CM | POA: Insufficient documentation

## 2017-09-29 LAB — BASIC METABOLIC PANEL
Anion gap: 9 (ref 5–15)
BUN: 10 mg/dL (ref 6–20)
CHLORIDE: 100 mmol/L — AB (ref 101–111)
CO2: 27 mmol/L (ref 22–32)
CREATININE: 0.99 mg/dL (ref 0.61–1.24)
Calcium: 9.2 mg/dL (ref 8.9–10.3)
GFR calc Af Amer: >60 mL/min (ref >60)
GFR calc non Af Amer: >60 mL/min (ref >60)
GLUCOSE: 168 mg/dL — AB (ref 65–99)
POTASSIUM: 3.9 mmol/L (ref 3.5–5.1)
SODIUM: 136 mmol/L (ref 135–145)

## 2017-09-29 LAB — CBC
HEMATOCRIT: 47 % (ref 39.0–52.0)
Hemoglobin: 16.2 g/dL (ref 13.0–17.0)
MCH: 32.9 pg (ref 26.0–34.0)
MCHC: 34.5 g/dL (ref 30.0–36.0)
MCV: 95.5 fL (ref 78.0–100.0)
PLATELETS: 247 10*3/uL (ref 150–400)
RBC: 4.92 MIL/uL (ref 4.22–5.81)
RDW: 13.3 % (ref 11.5–15.5)
WBC: 9.1 10*3/uL (ref 4.0–10.5)

## 2017-09-29 LAB — SURGICAL PCR SCREEN
MRSA, PCR: NEGATIVE
Staphylococcus aureus: NEGATIVE

## 2017-09-29 LAB — PROTIME-INR
INR: 0.97
Prothrombin Time: 12.7 seconds (ref 11.4–15.2)

## 2017-09-29 LAB — GLUCOSE, CAPILLARY: GLUCOSE-CAPILLARY: 155 mg/dL — AB (ref 65–99)

## 2017-09-29 LAB — HEMOGLOBIN A1C
Hgb A1c MFr Bld: 5.7 % — ABNORMAL HIGH (ref 4.8–5.6)
Mean Plasma Glucose: 116.89 mg/dL

## 2017-09-29 NOTE — Progress Notes (Addendum)
PCP: Dr. [name]   Cardiologist: Dr. Glenetta Hew  EKG: 09/15/17 in EPIC  Stress test: 02/15/03 in EPIC  ECHO: 01/01/10 in EPIC  Cardiac Cath:  Chest x-ray: 01/08/17 in Epic  Patient reports he was advised to stop aspirin and plavix 09/30/17 per Dr. Ellyn Hack.

## 2017-09-30 NOTE — Progress Notes (Signed)
Anesthesia Chart Review: Patient is a 82 year old male scheduled for right L3-4 laminectomy for synovial cyst on 10/08/17 by Dr. Erline Levine.   History includes smoking, CAD (a. 12/2009 - NSTEMI: PTCA proximal LAD, DES to distal LAD. b. cath 09/11/10: Prox LAD 60-70% pre-stent with FFR 0.82, 90% RVM -- Med Rx, EF 50-55%), HTN, HLD, DM2, BPH, arthritis, nasal fracture surgery, L4-S1 laminectomy 08/09/15 with redo 01/21/16.   - PCP is Dr. [name]. - Cardiologist is Dr. Glenetta Hew, last visit 09/15/17. Patient does not want to consider smoking cessation. He wrote, "David [name] is relatively astigmatic from both a pursue ischemic evaluation as it would not change my management.  I would also not pursue He has been active, but now limited by back pain.  His surgery is relatively urgent due to the nature of his symptoms and the growth of the cyst. Based on ACC and AHA guidelines, he would be considered to be low risk cardiac standpoint for a low risk surgery."  Medications include ASA 81 mg, Plavix, doxazosin, Zetia, finasteride, lisinopril, magnesium, metformin. He reported being advised by Dr. Ellyn Hack to hold ASA and Plavix starting 09/30/17.  BP 123/65   Pulse 80   Temp 36.8 C   Resp 20   Ht 5' 10.5" (1.791 m)   Wt 194 lb 8 oz (88.2 kg)   SpO2 98%   BMI 27.51 kg/m   EKG 09/15/17 ([facility]): SB at 48 bpm with marked sinus arrhythmia. LAD. Non-specific intraventricular block (incomplete left BBB pattern).   Cardiac cath 09/11/10:  1. Culprit lesion was likely the 90% ostial lesion in the right ventricular marginal branch. This was not a lesion or vessel that is usually amenable to percutaneous intervention, and therefore it is best for medical management. 2. Diffuse moderate, but nonobstructive coronary artery disease and wide patent stent in the left anterior descending. The conglomerate fractional flow reserve in the proximal and mid portion of the LAD is 0.81, which is not  hemodynamically significant. 3. Low normal ejection fraction of 50-55% with relatively normal left ventricular end-diastolic pressure. Plan: Optimize medical management. He is somewhat bradycardic and therefore it would be unlikely to go up on his beta-blocker, and would likely initiate a long-acting nitroglycerin versus amlodipine depending how is blood pressure is upon arrival to the floor. Smoking cessation counseling as the patient continues to smoke cigar.  Echo 01/01/10:  - Left ventricle: The cavity size was moderately dilated. Wall thickness was normal. Moderate hypokinesis of the anteroseptal and anterior myocardium; consistent with infarction. Doppler parameters are consistent with abnormal left ventricular relaxation (grade 1 diastolic dysfunction). - Left atrium: The atrium was mildly to moderately dilated. - Atrial septum: No defect or patent foramen ovale was identified.  CXR 01/08/17: IMPRESSION: COPD changes without acute infiltrate. Aortic Atherosclerosis (ICD10-I70.0) and Emphysema (ICD10-J43.9).  Preoperative labs noted. Cr 0.99. Glucose 168. CBC WNL. PT/INR WNL. A1c 5.7.   Based on currently available information, I would anticipate that he can proceed as planned if no acute changes.  David [name] [facility] Short Stay Center/Anesthesiology Phone [phone] 09/30/2017 1:59 PM

## 2017-10-08 ENCOUNTER — Inpatient Hospital Stay (HOSPITAL_COMMUNITY)
Admission: RE | Disposition: A | Discharge status: Home / Self Care | Payer: Self-pay | Source: Ambulatory Visit | Attending: Neurosurgery

## 2017-10-08 ENCOUNTER — Inpatient Hospital Stay (HOSPITAL_COMMUNITY): Payer: Medicare Other

## 2017-10-08 ENCOUNTER — Inpatient Hospital Stay (HOSPITAL_COMMUNITY)
Admission: RE | Admit: 2017-10-08 | Discharge: 2017-10-09 | DRG: 517 | Disposition: A | Discharge status: Home / Self Care | Payer: Medicare Other | Source: Ambulatory Visit | Attending: Neurosurgery | Admitting: Neurosurgery

## 2017-10-08 ENCOUNTER — Inpatient Hospital Stay (HOSPITAL_COMMUNITY): Payer: Medicare Other | Admitting: Anesthesiology

## 2017-10-08 ENCOUNTER — Other Ambulatory Visit: Payer: Self-pay

## 2017-10-08 ENCOUNTER — Inpatient Hospital Stay (HOSPITAL_COMMUNITY): Payer: Medicare Other | Admitting: Vascular Surgery

## 2017-10-08 ENCOUNTER — Encounter (HOSPITAL_COMMUNITY): Payer: Self-pay | Admitting: General Practice

## 2017-10-08 DIAGNOSIS — M7138 Other bursal cyst, other site: Secondary | ICD-10-CM | POA: Diagnosis present

## 2017-10-08 DIAGNOSIS — M48062 Spinal stenosis, lumbar region with neurogenic claudication: Secondary | ICD-10-CM | POA: Diagnosis present

## 2017-10-08 DIAGNOSIS — Z955 Presence of coronary angioplasty implant and graft: Secondary | ICD-10-CM | POA: Diagnosis not present

## 2017-10-08 DIAGNOSIS — Z79899 Other long term (current) drug therapy: Secondary | ICD-10-CM

## 2017-10-08 DIAGNOSIS — Z887 Allergy status to serum and vaccine status: Secondary | ICD-10-CM | POA: Diagnosis not present

## 2017-10-08 DIAGNOSIS — M48061 Spinal stenosis, lumbar region without neurogenic claudication: Secondary | ICD-10-CM | POA: Diagnosis not present

## 2017-10-08 DIAGNOSIS — Z419 Encounter for procedure for purposes other than remedying health state, unspecified: Secondary | ICD-10-CM

## 2017-10-08 DIAGNOSIS — M7130 Other bursal cyst, unspecified site: Secondary | ICD-10-CM | POA: Diagnosis present

## 2017-10-08 DIAGNOSIS — M545 Low back pain: Secondary | ICD-10-CM | POA: Diagnosis not present

## 2017-10-08 DIAGNOSIS — M5416 Radiculopathy, lumbar region: Secondary | ICD-10-CM | POA: Diagnosis present

## 2017-10-08 HISTORY — PX: LUMBAR LAMINECTOMY/DECOMPRESSION MICRODISCECTOMY: SHX5026

## 2017-10-08 LAB — GLUCOSE, CAPILLARY
GLUCOSE-CAPILLARY: 121 mg/dL — AB (ref 65–99)
GLUCOSE-CAPILLARY: 183 mg/dL — AB (ref 65–99)
Glucose-Capillary: 128 mg/dL — ABNORMAL HIGH (ref 65–99)
Glucose-Capillary: 121 mg/dL — ABNORMAL HIGH (ref 65–99)
Glucose-Capillary: 183 mg/dL — ABNORMAL HIGH (ref 65–99)

## 2017-10-08 SURGERY — LUMBAR LAMINECTOMY/DECOMPRESSION MICRODISCECTOMY 1 LEVEL
Anesthesia: General | Site: Spine Lumbar | Laterality: Right

## 2017-10-08 MED ORDER — ACETAMINOPHEN 500 MG PO TABS
1000.0000 mg | ORAL_TABLET | Freq: Four times a day (QID) | ORAL | Status: DC | PRN
  Administered 2017-10-08: 1000 mg via ORAL
  Filled 2017-10-08: qty 2

## 2017-10-08 MED ORDER — LIDOCAINE-EPINEPHRINE 1 %-1:100000 IJ SOLN
INTRAMUSCULAR | Status: DC | PRN
  Administered 2017-10-08: 5 mL

## 2017-10-08 MED ORDER — ACETAMINOPHEN 650 MG RE SUPP: 650.0000 mg | RECTAL | Status: DC | PRN

## 2017-10-08 MED ORDER — METFORMIN HCL ER 500 MG PO TB24
1000.0000 mg | ORAL_TABLET | Freq: Two times a day (BID) | ORAL | Status: DC
  Administered 2017-10-08 – 2017-10-09 (×2): 1000 mg via ORAL
  Filled 2017-10-08 (×2): qty 2

## 2017-10-08 MED ORDER — LIDOCAINE 2% (20 MG/ML) 5 ML SYRINGE
INTRAMUSCULAR | Status: DC | PRN
  Administered 2017-10-08: 60 mg via INTRAVENOUS

## 2017-10-08 MED ORDER — ONDANSETRON HCL 4 MG/2ML IJ SOLN
4.0000 mg | Freq: Four times a day (QID) | INTRAMUSCULAR | Status: DC | PRN
Start: 2017-10-08 — End: 2017-10-09

## 2017-10-08 MED ORDER — ACETAMINOPHEN 325 MG PO TABS: 650.0000 mg | ORAL_TABLET | ORAL | Status: DC | PRN

## 2017-10-08 MED ORDER — ONDANSETRON HCL 4 MG/2ML IJ SOLN
INTRAMUSCULAR | Status: DC | PRN
  Administered 2017-10-08: 4 mg via INTRAVENOUS

## 2017-10-08 MED ORDER — ACETAMINOPHEN 160 MG/5ML PO SOLN: 325.0000 mg | ORAL | Status: DC | PRN

## 2017-10-08 MED ORDER — FLEET ENEMA 7-19 GM/118ML RE ENEM: 1.0000 | ENEMA | Freq: Once | RECTAL | Status: DC | PRN

## 2017-10-08 MED ORDER — CEFAZOLIN SODIUM-DEXTROSE 2-4 GM/100ML-% IV SOLN
2.0000 g | INTRAVENOUS | Status: AC
  Administered 2017-10-08: 2 g via INTRAVENOUS

## 2017-10-08 MED ORDER — ROCURONIUM BROMIDE 10 MG/ML (PF) SYRINGE
PREFILLED_SYRINGE | INTRAVENOUS | Status: DC | PRN
  Administered 2017-10-08: 50 mg via INTRAVENOUS
  Administered 2017-10-08: 20 mg via INTRAVENOUS
  Administered 2017-10-08: 30 mg via INTRAVENOUS

## 2017-10-08 MED ORDER — SUCCINYLCHOLINE CHLORIDE 20 MG/ML IJ SOLN
INTRAMUSCULAR | Status: DC | PRN
  Administered 2017-10-08: 100 mg via INTRAVENOUS

## 2017-10-08 MED ORDER — MAGNESIUM 500 MG PO CAPS: 1.0000 | ORAL_CAPSULE | Freq: Every day | ORAL | Status: DC

## 2017-10-08 MED ORDER — THROMBIN 5000 UNITS EX SOLR
CUTANEOUS | Status: AC
  Filled 2017-10-08: qty 15000

## 2017-10-08 MED ORDER — PANTOPRAZOLE SODIUM 40 MG PO TBEC
40.0000 mg | DELAYED_RELEASE_TABLET | Freq: Every day | ORAL | Status: DC
  Administered 2017-10-08: 40 mg via ORAL
  Filled 2017-10-08: qty 1

## 2017-10-08 MED ORDER — THROMBIN 5000 UNITS EX SOLR
CUTANEOUS | Status: DC | PRN
  Administered 2017-10-08: 5000 [IU] via TOPICAL

## 2017-10-08 MED ORDER — METHYLPREDNISOLONE ACETATE 80 MG/ML IJ SUSP
INTRAMUSCULAR | Status: AC
  Filled 2017-10-08: qty 1

## 2017-10-08 MED ORDER — DEXAMETHASONE SODIUM PHOSPHATE 10 MG/ML IJ SOLN
INTRAMUSCULAR | Status: AC
Start: 2017-10-08 — End: ?
  Filled 2017-10-08: qty 1

## 2017-10-08 MED ORDER — FENTANYL CITRATE (PF) 100 MCG/2ML IJ SOLN
INTRAMUSCULAR | Status: AC
  Filled 2017-10-08: qty 2

## 2017-10-08 MED ORDER — CHLORHEXIDINE GLUCONATE CLOTH 2 % EX PADS: 6.0000 | MEDICATED_PAD | Freq: Once | CUTANEOUS | Status: DC

## 2017-10-08 MED ORDER — ONDANSETRON HCL 4 MG/2ML IJ SOLN
INTRAMUSCULAR | Status: AC
Start: 2017-10-08 — End: ?
  Filled 2017-10-08: qty 2

## 2017-10-08 MED ORDER — MORPHINE SULFATE (PF) 4 MG/ML IV SOLN: 2.0000 mg | INTRAVENOUS | Status: DC | PRN

## 2017-10-08 MED ORDER — LIDOCAINE-EPINEPHRINE 1 %-1:100000 IJ SOLN
INTRAMUSCULAR | Status: AC
  Filled 2017-10-08: qty 1

## 2017-10-08 MED ORDER — LISINOPRIL 10 MG PO TABS: 5.0000 mg | ORAL_TABLET | Freq: Every day | ORAL | Status: DC

## 2017-10-08 MED ORDER — POLYETHYLENE GLYCOL 3350 17 G PO PACK: 17.0000 g | PACK | Freq: Every day | ORAL | Status: DC | PRN

## 2017-10-08 MED ORDER — DOCUSATE SODIUM 100 MG PO CAPS
100.0000 mg | ORAL_CAPSULE | Freq: Two times a day (BID) | ORAL | Status: DC
  Administered 2017-10-08: 100 mg via ORAL
  Filled 2017-10-08: qty 1

## 2017-10-08 MED ORDER — EZETIMIBE 10 MG PO TABS
10.0000 mg | ORAL_TABLET | Freq: Every day | ORAL | Status: DC
  Administered 2017-10-08: 10 mg via ORAL
  Filled 2017-10-08 (×2): qty 1

## 2017-10-08 MED ORDER — CINNAMON 500 MG PO CAPS: 1000.0000 mg | ORAL_CAPSULE | Freq: Every day | ORAL | Status: DC

## 2017-10-08 MED ORDER — METHYLPREDNISOLONE ACETATE 80 MG/ML IJ SUSP
INTRAMUSCULAR | Status: DC | PRN
  Administered 2017-10-08: 80 mg

## 2017-10-08 MED ORDER — PHENYLEPHRINE HCL 10 MG/ML IJ SOLN
INTRAVENOUS | Status: DC | PRN
  Administered 2017-10-08: 30 ug/min via INTRAVENOUS

## 2017-10-08 MED ORDER — INSULIN ASPART 100 UNIT/ML ~~LOC~~ SOLN
0.0000 [IU] | Freq: Three times a day (TID) | SUBCUTANEOUS | Status: DC
  Administered 2017-10-08 – 2017-10-09 (×2): 2 [IU] via SUBCUTANEOUS

## 2017-10-08 MED ORDER — SODIUM CHLORIDE 0.9% FLUSH: 3.0000 mL | Freq: Two times a day (BID) | INTRAVENOUS | Status: DC

## 2017-10-08 MED ORDER — OXYCODONE HCL 5 MG PO TABS: 5.0000 mg | ORAL_TABLET | Freq: Once | ORAL | Status: DC | PRN

## 2017-10-08 MED ORDER — FENTANYL CITRATE (PF) 250 MCG/5ML IJ SOLN
INTRAMUSCULAR | Status: AC
Start: 2017-10-08 — End: ?
  Filled 2017-10-08: qty 5

## 2017-10-08 MED ORDER — VITAMIN D 1000 UNITS PO TABS
2000.0000 [IU] | ORAL_TABLET | Freq: Every morning | ORAL | Status: DC
  Filled 2017-10-08: qty 2

## 2017-10-08 MED ORDER — OXYCODONE HCL 5 MG PO TABS: 5.0000 mg | ORAL_TABLET | ORAL | Status: DC | PRN

## 2017-10-08 MED ORDER — PROPOFOL 10 MG/ML IV BOLUS
INTRAVENOUS | Status: DC | PRN
  Administered 2017-10-08: 110 mg via INTRAVENOUS

## 2017-10-08 MED ORDER — METHOCARBAMOL 1000 MG/10ML IJ SOLN: 500.0000 mg | Freq: Four times a day (QID) | INTRAVENOUS | Status: DC | PRN

## 2017-10-08 MED ORDER — SUGAMMADEX SODIUM 200 MG/2ML IV SOLN
INTRAVENOUS | Status: DC | PRN
  Administered 2017-10-08: 200 mg via INTRAVENOUS

## 2017-10-08 MED ORDER — CEFAZOLIN SODIUM-DEXTROSE 2-4 GM/100ML-% IV SOLN
INTRAVENOUS | Status: AC
  Filled 2017-10-08: qty 100

## 2017-10-08 MED ORDER — OXYCODONE HCL 5 MG PO TABS: 10.0000 mg | ORAL_TABLET | ORAL | Status: DC | PRN

## 2017-10-08 MED ORDER — ACETAMINOPHEN 325 MG PO TABS: 325.0000 mg | ORAL_TABLET | ORAL | Status: DC | PRN

## 2017-10-08 MED ORDER — FINASTERIDE 5 MG PO TABS
5.0000 mg | ORAL_TABLET | Freq: Every day | ORAL | Status: DC
  Administered 2017-10-09: 5 mg via ORAL
  Filled 2017-10-08: qty 1

## 2017-10-08 MED ORDER — PANTOPRAZOLE SODIUM 40 MG IV SOLR: 40.0000 mg | Freq: Every day | INTRAVENOUS | Status: DC

## 2017-10-08 MED ORDER — ZOLPIDEM TARTRATE 5 MG PO TABS: 5.0000 mg | ORAL_TABLET | Freq: Every evening | ORAL | Status: DC | PRN

## 2017-10-08 MED ORDER — HEMOSTATIC AGENTS (NO CHARGE) OPTIME
TOPICAL | Status: DC | PRN
  Administered 2017-10-08: 1 via TOPICAL

## 2017-10-08 MED ORDER — MENTHOL 3 MG MT LOZG: 1.0000 | LOZENGE | OROMUCOSAL | Status: DC | PRN

## 2017-10-08 MED ORDER — FENTANYL CITRATE (PF) 100 MCG/2ML IJ SOLN
INTRAMUSCULAR | Status: DC | PRN
  Administered 2017-10-08: 100 ug via INTRAVENOUS

## 2017-10-08 MED ORDER — SODIUM CHLORIDE 0.9% FLUSH: 3.0000 mL | INTRAVENOUS | Status: DC | PRN

## 2017-10-08 MED ORDER — OXYCODONE HCL 5 MG/5ML PO SOLN: 5.0000 mg | Freq: Once | ORAL | Status: DC | PRN

## 2017-10-08 MED ORDER — ALUM & MAG HYDROXIDE-SIMETH 200-200-20 MG/5ML PO SUSP: 30.0000 mL | Freq: Four times a day (QID) | ORAL | Status: DC | PRN

## 2017-10-08 MED ORDER — DOXAZOSIN MESYLATE 4 MG PO TABS
4.0000 mg | ORAL_TABLET | Freq: Every day | ORAL | Status: DC
  Administered 2017-10-08: 4 mg via ORAL
  Filled 2017-10-08 (×2): qty 1

## 2017-10-08 MED ORDER — FENTANYL CITRATE (PF) 100 MCG/2ML IJ SOLN: 25.0000 ug | INTRAMUSCULAR | Status: DC | PRN

## 2017-10-08 MED ORDER — FENTANYL CITRATE (PF) 250 MCG/5ML IJ SOLN
INTRAMUSCULAR | Status: DC | PRN
  Administered 2017-10-08: 100 ug via INTRAVENOUS
  Administered 2017-10-08: 50 ug via INTRAVENOUS

## 2017-10-08 MED ORDER — SODIUM CHLORIDE 0.9 % IV SOLN: 250.0000 mL | INTRAVENOUS | Status: DC

## 2017-10-08 MED ORDER — BUPIVACAINE HCL (PF) 0.5 % IJ SOLN
INTRAMUSCULAR | Status: AC
  Filled 2017-10-08: qty 30

## 2017-10-08 MED ORDER — METHOCARBAMOL 500 MG PO TABS
500.0000 mg | ORAL_TABLET | Freq: Four times a day (QID) | ORAL | Status: DC | PRN
  Administered 2017-10-08: 500 mg via ORAL
  Filled 2017-10-08: qty 1

## 2017-10-08 MED ORDER — THROMBIN (RECOMBINANT) 5000 UNITS EX SOLR
CUTANEOUS | Status: DC | PRN
  Administered 2017-10-08 (×2): 5 mL via TOPICAL

## 2017-10-08 MED ORDER — CEFAZOLIN SODIUM-DEXTROSE 2-4 GM/100ML-% IV SOLN
2.0000 g | Freq: Three times a day (TID) | INTRAVENOUS | Status: AC
  Administered 2017-10-08 – 2017-10-09 (×2): 2 g via INTRAVENOUS
  Filled 2017-10-08 (×2): qty 100

## 2017-10-08 MED ORDER — PHENOL 1.4 % MT LIQD: 1.0000 | OROMUCOSAL | Status: DC | PRN

## 2017-10-08 MED ORDER — ONDANSETRON HCL 4 MG PO TABS: 4.0000 mg | ORAL_TABLET | Freq: Four times a day (QID) | ORAL | Status: DC | PRN

## 2017-10-08 MED ORDER — INSULIN ASPART 100 UNIT/ML ~~LOC~~ SOLN
4.0000 [IU] | Freq: Three times a day (TID) | SUBCUTANEOUS | Status: DC
  Administered 2017-10-08 – 2017-10-09 (×2): 4 [IU] via SUBCUTANEOUS

## 2017-10-08 MED ORDER — INSULIN ASPART 100 UNIT/ML ~~LOC~~ SOLN: 0.0000 [IU] | Freq: Every day | SUBCUTANEOUS | Status: DC

## 2017-10-08 MED ORDER — DEXAMETHASONE SODIUM PHOSPHATE 10 MG/ML IJ SOLN
INTRAMUSCULAR | Status: DC | PRN
  Administered 2017-10-08: 5 mg via INTRAVENOUS

## 2017-10-08 MED ORDER — HYDROCODONE-ACETAMINOPHEN 5-325 MG PO TABS: 1.0000 | ORAL_TABLET | ORAL | Status: DC | PRN

## 2017-10-08 MED ORDER — PROPOFOL 10 MG/ML IV BOLUS
INTRAVENOUS | Status: AC
  Filled 2017-10-08: qty 20

## 2017-10-08 MED ORDER — BISACODYL 10 MG RE SUPP: 10.0000 mg | Freq: Every day | RECTAL | Status: DC | PRN

## 2017-10-08 MED ORDER — 0.9 % SODIUM CHLORIDE (POUR BTL) OPTIME
TOPICAL | Status: DC | PRN
  Administered 2017-10-08: 1000 mL

## 2017-10-08 MED ORDER — KCL IN DEXTROSE-NACL 20-5-0.45 MEQ/L-%-% IV SOLN
INTRAVENOUS | Status: DC
Start: 2017-10-08 — End: 2017-10-09

## 2017-10-08 MED ORDER — LACTATED RINGERS IV SOLN
INTRAVENOUS | Status: DC
  Administered 2017-10-08: 10:00:00 via INTRAVENOUS

## 2017-10-08 MED ORDER — BUPIVACAINE HCL (PF) 0.5 % IJ SOLN
INTRAMUSCULAR | Status: DC | PRN
  Administered 2017-10-08: 5 mL

## 2017-10-08 SURGICAL SUPPLY — 60 items
ADH SKN CLS APL DERMABOND .7 (GAUZE/BANDAGES/DRESSINGS) ×1
BLADE CLIPPER SURG (BLADE) ×2 IMPLANT
BUR MATCHSTICK NEURO 3.0 LAGG (BURR) ×3 IMPLANT
BUR ROUND FLUTED 5 RND (BURR) ×2 IMPLANT
BUR ROUND FLUTED 5MM RND (BURR) ×1
CANISTER SUCT 3000ML PPV (MISCELLANEOUS) ×3 IMPLANT
CARTRIDGE OIL MAESTRO DRILL (MISCELLANEOUS) ×1 IMPLANT
DECANTER SPIKE VIAL GLASS SM (MISCELLANEOUS) ×3 IMPLANT
DERMABOND ADVANCED (GAUZE/BANDAGES/DRESSINGS) ×2
DERMABOND ADVANCED .7 DNX12 (GAUZE/BANDAGES/DRESSINGS) ×1 IMPLANT
DIFFUSER DRILL AIR PNEUMATIC (MISCELLANEOUS) ×3 IMPLANT
DRAPE LAPAROTOMY 100X72X124 (DRAPES) ×3 IMPLANT
DRAPE MICROSCOPE LEICA (MISCELLANEOUS) ×3 IMPLANT
DRAPE SURG 17X23 STRL (DRAPES) ×3 IMPLANT
DRSG OPSITE POSTOP 3X4 (GAUZE/BANDAGES/DRESSINGS) ×2 IMPLANT
DURAPREP 26ML APPLICATOR (WOUND CARE) ×3 IMPLANT
ELECT BLADE 4.0 EZ CLEAN MEGAD (MISCELLANEOUS) ×3
ELECT REM PT RETURN 9FT ADLT (ELECTROSURGICAL) ×3
ELECTRODE BLDE 4.0 EZ CLN MEGD (MISCELLANEOUS) IMPLANT
ELECTRODE REM PT RTRN 9FT ADLT (ELECTROSURGICAL) ×1 IMPLANT
GAUZE SPONGE 4X4 12PLY STRL (GAUZE/BANDAGES/DRESSINGS) IMPLANT
GAUZE SPONGE 4X4 16PLY XRAY LF (GAUZE/BANDAGES/DRESSINGS) IMPLANT
GLOVE BIO SURGEON STRL SZ8 (GLOVE) ×3 IMPLANT
GLOVE BIOGEL PI IND STRL 6.5 (GLOVE) IMPLANT
GLOVE BIOGEL PI IND STRL 8 (GLOVE) ×1 IMPLANT
GLOVE BIOGEL PI IND STRL 8.5 (GLOVE) ×1 IMPLANT
GLOVE BIOGEL PI INDICATOR 6.5 (GLOVE) ×2
GLOVE BIOGEL PI INDICATOR 8 (GLOVE) ×6
GLOVE BIOGEL PI INDICATOR 8.5 (GLOVE) ×2
GLOVE ECLIPSE 7.5 STRL STRAW (GLOVE) ×6 IMPLANT
GLOVE ECLIPSE 8.0 STRL XLNG CF (GLOVE) ×3 IMPLANT
GLOVE EXAM NITRILE LRG STRL (GLOVE) IMPLANT
GLOVE EXAM NITRILE XL STR (GLOVE) IMPLANT
GLOVE EXAM NITRILE XS STR PU (GLOVE) IMPLANT
GOWN STRL REUS W/ TWL LRG LVL3 (GOWN DISPOSABLE) IMPLANT
GOWN STRL REUS W/ TWL XL LVL3 (GOWN DISPOSABLE) ×1 IMPLANT
GOWN STRL REUS W/TWL 2XL LVL3 (GOWN DISPOSABLE) ×3 IMPLANT
GOWN STRL REUS W/TWL LRG LVL3 (GOWN DISPOSABLE)
GOWN STRL REUS W/TWL XL LVL3 (GOWN DISPOSABLE) ×6
HEMOSTAT POWDER KIT SURGIFOAM (HEMOSTASIS) ×3 IMPLANT
KIT BASIN OR (CUSTOM PROCEDURE TRAY) ×3 IMPLANT
KIT ROOM TURNOVER OR (KITS) ×3 IMPLANT
NDL HYPO 18GX1.5 BLUNT FILL (NEEDLE) IMPLANT
NDL HYPO 25X1 1.5 SAFETY (NEEDLE) ×1 IMPLANT
NEEDLE HYPO 18GX1.5 BLUNT FILL (NEEDLE) IMPLANT
NEEDLE HYPO 25X1 1.5 SAFETY (NEEDLE) ×3 IMPLANT
NS IRRIG 1000ML POUR BTL (IV SOLUTION) ×3 IMPLANT
OIL CARTRIDGE MAESTRO DRILL (MISCELLANEOUS) ×3
PACK LAMINECTOMY NEURO (CUSTOM PROCEDURE TRAY) ×3 IMPLANT
PAD ARMBOARD 7.5X6 YLW CONV (MISCELLANEOUS) ×9 IMPLANT
RUBBERBAND STERILE (MISCELLANEOUS) ×6 IMPLANT
SPONGE SURGIFOAM ABS GEL SZ50 (HEMOSTASIS) ×2 IMPLANT
SUT VIC AB 0 CT1 18XCR BRD8 (SUTURE) ×1 IMPLANT
SUT VIC AB 0 CT1 8-18 (SUTURE) ×3
SUT VIC AB 2-0 CT1 18 (SUTURE) ×3 IMPLANT
SUT VIC AB 3-0 SH 8-18 (SUTURE) ×3 IMPLANT
SYR 5ML LL (SYRINGE) IMPLANT
TOWEL GREEN STERILE (TOWEL DISPOSABLE) ×3 IMPLANT
TOWEL GREEN STERILE FF (TOWEL DISPOSABLE) ×3 IMPLANT
WATER STERILE IRR 1000ML POUR (IV SOLUTION) ×3 IMPLANT

## 2017-10-08 NOTE — Anesthesia Postprocedure Evaluation (Signed)
Anesthesia Post Note  Patient: David [name]  Procedure(s) Performed: Right Lumbar three-four Laminectomy for synovial cyst (Right Spine Lumbar)     Patient location during evaluation: PACU Anesthesia Type: General Level of consciousness: awake and alert Pain management: pain level controlled Vital Signs Assessment: post-procedure vital signs reviewed and stable Respiratory status: spontaneous breathing, nonlabored ventilation, respiratory function stable and patient connected to nasal cannula oxygen Cardiovascular status: blood pressure returned to baseline and stable Postop Assessment: no apparent nausea or vomiting Anesthetic complications: no    Last Vitals:  Vitals Value Taken Time  BP    Temp    Pulse    Resp    SpO2      Last Pain:  Vitals:   10/08/17 1436  TempSrc:   PainSc: 3                  [name]

## 2017-10-08 NOTE — H&P (Signed)
Patient ID:   [id number] Patient: David [name]  Date of Birth: 1936-07-11 Visit Type: Office Visit   Date: 09/07/2017 01:45 PM Provider: Marchia Meiers. Vertell Limber MD   This 82 year old male presents for MRI review.   History of Present Illness: 1.  MRI review  Patient returns to review his MRI  MRI demonstrates new synovial cyst at L3-4 on the right with significant thecal sac and nerve root compression.  He is getting progressive worsening of right leg weakness.  Previous laminectomies for synovial cyst show that they have resolved.  There is no evidence of recurrent synovial cyst at any other level nor is there evidence of significant spondylolisthesis.   The patient is on Plavix and he has come off Plavix for surgery.  He says that while he does not want to have additional surgery he cannot function in his current state.          PAST MEDICAL HISTORY, SURGICAL HISTORY, FAMILY HISTORY, SOCIAL HISTORY AND REVIEW OF SYSTEMS I have reviewed the patient's past medical, surgical, family and social history as well as the comprehensive review of systems as included on the [facility] history form dated 08/10/2017, which I have signed.   MEDICATIONS(added, continued or stopped this visit): Started Medication Directions Instruction Stopped   aspirin 81 mg tablet,delayed release take 1 tablet by oral route  every day     Cardura 4 mg tablet take 1 tablet by oral route  every day     Cinnamon 500 mg capsule Take as directed     magnesium 500 mg ORAL Take as directed     metformin 500 mg tablet take 1 tablet by oral route 2 times every day with morning and evening meals     Plavix 75 mg tablet take 1 tablet by oral route  every day     Proscar 5 mg tablet take 1 tablet by oral route  every day     Vitamin D3 1,000 unit capsule Take as directed     Zestril 5 mg tablet take 1 tablet by oral route  every day     Zetia 10 mg tablet take 1 tablet by oral route  every  day       ALLERGIES: Ingredient Reaction Medication Name Comment  TETANUS VACCINES AND TOXOID      Reviewed, no changes.    Vitals Date Temp F BP Pulse Ht In Wt Lb BMI BSA Pain Score  09/07/2017  105/65 72 70.5 194 27.44  0/10      IMPRESSION New synovial cyst right L3-4 level with right leg weakness and quadriceps weakness.   Patient is on an anti-coagulant, anti-inflammatory or supplement that may increase bleeding time. Patient advised to stop medicine prior to surgery.  Comments:  Patient knows Plavix will need to stop 10 days prior to surgery.  Cardiologist is Dr. Ellyn Hack  Completed Orders (this encounter) Order Details Reason Side Interpretation Result Initial Treatment Date Region  Dietary management education, guidance, and counseling patient encouraged to eat a well balanced diet         Assessment/Plan # Detail Type Description   1. Assessment Low back pain, unspecified back pain laterality, with sciatica presence unspecified (M54.5).       2. 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       3. Assessment Radiculopathy, lumbar region (M54.16).       4. 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  5. Assessment Body mass index (BMI) 27.0-27.9, adult (L57.26).   Plan Orders Today's instructions / counseling include(s) Dietary management education, guidance, and counseling.           Pain Management Plan Pain Scale: 0/10. Method: Numeric Pain Intensity Scale. Onset: 05/10/2017.  Proceed with right L3-4 laminectomy for synovial cyst and resultant spinal stenosis because of this lesion.  Patient will speak with Dr. Ellyn Hack, his cardiologist about coming off Plavix and is going to see him in the near future for an annual visit.     Orders: Instruction(s)/Education: Assessment Instruction  [phone] Dietary management education, guidance, and counseling             Provider:  Vertell Limber MD, Marchia Meiers 09/12/2017 4:05 PM   Dictation edited by: Marchia Meiers. Vertell Limber    CC Providers: [name] [facility] [address] [place],  [place]  [id number]-   Terena Bohan MD  [address] [place], [place] [address]              Electronically signed by Marchia Meiers. Vertell Limber MD on 09/12/2017 04:05 PM

## 2017-10-08 NOTE — Anesthesia Preprocedure Evaluation (Signed)
Anesthesia Evaluation  Patient identified by MRN, date of birth, ID band Patient awake    Reviewed: Allergy & Precautions, H&P , NPO status , Patient's Chart, lab work & pertinent test results  History of Anesthesia Complications Negative for: history of anesthetic complications  Airway Mallampati: III  TM Distance: >3 FB Neck ROM: Full    Dental no notable dental hx. (+) Teeth Intact, Dental Advisory Given, Poor Dentition,    Pulmonary neg shortness of breath, neg sleep apnea, neg COPD, neg recent URI, Current Smoker,    Pulmonary exam normal breath sounds clear to auscultation       Cardiovascular hypertension, Pt. on medications + CAD, + Past MI, + Cardiac Stents and + Peripheral Vascular Disease   Rhythm:Regular Rate:Normal     Neuro/Psych negative neurological ROS  negative psych ROS   GI/Hepatic negative GI ROS, Neg liver ROS,   Endo/Other  diabetes, Type 2, Oral Hypoglycemic Agents  Renal/GU Renal disease  negative genitourinary   Musculoskeletal  (+) Arthritis , Osteoarthritis,    Abdominal   Peds  Hematology negative hematology ROS (+)   Anesthesia Other Findings   Reproductive/Obstetrics negative OB ROS                             Anesthesia Physical Anesthesia Plan  ASA: II  Anesthesia Plan: General   Post-op Pain Management:    Induction: Intravenous  PONV Risk Score and Plan: 1 and Ondansetron  Airway Management Planned: Oral ETT  Additional Equipment: None  Intra-op Plan:   Post-operative Plan: Extubation in OR  Informed Consent: I have reviewed the patients History and Physical, chart, labs and discussed the procedure including the risks, benefits and alternatives for the proposed anesthesia with the patient or authorized representative who has indicated his/her understanding and acceptance.   Dental advisory given  Plan Discussed with: CRNA and  Surgeon  Anesthesia Plan Comments:         Anesthesia Quick Evaluation

## 2017-10-08 NOTE — Evaluation (Signed)
Physical Therapy Evaluation Patient Details Name: David [name] MRN: [id number] DOB: [birth year] Today's Date: 10/08/2017   History of Present Illness  Pt is a 82 y/o male s/p L3-4 laminectomy. PMH includes CAD s/p stent, PVD, MI, DM, and current smoker.   Clinical Impression  Patient is s/p above surgery resulting in the deficits listed below (see PT Problem List). Pt requiring min to min guard for ambulation this session. Noted slight R knee buckling during ambulation. Educated about generalized walking program to perform at home and back precautions.  Patient will benefit from skilled PT to increase their independence and safety with mobility (while adhering to their precautions) to allow discharge to the venue listed below.     Follow Up Recommendations No PT follow up;Supervision for mobility/OOB    Equipment Recommendations  None recommended by PT    Recommendations for Other Services       Precautions / Restrictions Precautions Precautions: Back Precaution Booklet Issued: Yes (comment) Precaution Comments: Reviewed back precautions with pt.  Restrictions Weight Bearing Restrictions: No      Mobility  Bed Mobility Overal bed mobility: Needs Assistance Bed Mobility: Rolling;Sidelying to Sit Rolling: Supervision Sidelying to sit: Supervision       General bed mobility comments: Supervision for safety. Verbal cues for sequencing using log roll technique.   Transfers Overall transfer level: Needs assistance Equipment used: 1 person hand held assist Transfers: Sit to/from Stand Sit to Stand: Min assist         General transfer comment: Min A for lift assist and steadying assist. Required 2 attempts to stand secondary to pain.   Ambulation/Gait Ambulation/Gait assistance: Min guard;Min assist Ambulation Distance (Feet): 200 Feet Assistive device: 1 person hand held assist(IV pole) Gait Pattern/deviations: Step-through pattern;Decreased stride length Gait  velocity: Decreased  Gait velocity interpretation: Below normal speed for age/gender General Gait Details: Slow, guarded gait. Slightly unsteady and noted slight R knee buckling. Pt reports RLE weakness at baseline. Required min to min guard for steadying. Educated about generalized walking program at home.   Stairs            Wheelchair Mobility    Modified Rankin (Stroke Patients Only)       Balance Overall balance assessment: Needs assistance Sitting-balance support: No upper extremity supported;Feet supported Sitting balance-Leahy Scale: Good     Standing balance support: Bilateral upper extremity supported;During functional activity Standing balance-Leahy Scale: Poor Standing balance comment: Reliant on UE support                              Pertinent Vitals/Pain Pain Assessment: 0-10 Pain Score: 3  Pain Location: back  Pain Descriptors / Indicators: Aching;Operative site guarding Pain Intervention(s): Limited activity within patient's tolerance;Monitored during session;Repositioned    Home Living Family/patient expects to be discharged to:: Private residence Living Arrangements: Spouse/significant other Available Help at Discharge: Family;Available 24 hours/day Type of Home: House Home Access: Stairs to enter Entrance Stairs-Rails: Right Entrance Stairs-Number of Steps: 6-8 Home Layout: Two level Home Equipment: Shower seat;Bedside commode      Prior Function Level of Independence: Independent               Hand Dominance        Extremity/Trunk Assessment   Upper Extremity Assessment Upper Extremity Assessment: Defer to OT evaluation    Lower Extremity Assessment Lower Extremity Assessment: RLE deficits/detail RLE Deficits / Details: RLE functional weakness at baseline. Slight buckling  noted during gait.     Cervical / Trunk Assessment Cervical / Trunk Assessment: Other exceptions Cervical / Trunk Exceptions: s/p lumbar  surgery   Communication   Communication: No difficulties  Cognition Arousal/Alertness: Awake/alert Behavior During Therapy: WFL for tasks assessed/performed Overall Cognitive Status: Within Functional Limits for tasks assessed                                        General Comments      Exercises     Assessment/Plan    PT Assessment Patient needs continued PT services  PT Problem List Decreased strength;Decreased range of motion;Decreased balance;Decreased mobility;Decreased knowledge of use of DME;Decreased knowledge of precautions;Pain       PT Treatment Interventions Gait training;Stair training;Functional mobility training;Therapeutic activities;Therapeutic exercise;Balance training;Cognitive remediation    PT Goals (Current goals can be found in the Care Plan section)  Acute Rehab PT Goals Patient Stated Goal: to go home  PT Goal Formulation: With patient Time For Goal Achievement: 10/22/17 Potential to Achieve Goals: Good    Frequency Min 5X/week   Barriers to discharge        Co-evaluation               AM-PAC PT "6 Clicks" Daily Activity  Outcome Measure Difficulty turning over in bed (including adjusting bedclothes, sheets and blankets)?: A Little Difficulty moving from lying on back to sitting on the side of the bed? : A Little Difficulty sitting down on and standing up from a chair with arms (e.g., wheelchair, bedside commode, etc,.)?: Unable Help needed moving to and from a bed to chair (including a wheelchair)?: A Little Help needed walking in hospital room?: A Little Help needed climbing 3-5 steps with a railing? : A Lot 6 Click Score: 15    End of Session Equipment Utilized During Treatment: Gait belt Activity Tolerance: Patient tolerated treatment well Patient left: in chair;with call bell/phone within reach Nurse Communication: Mobility status PT Visit Diagnosis: Unsteadiness on feet (R26.81);Other abnormalities of gait and  mobility (R26.89)    Time: 1700-1717 PT Time Calculation (min) (ACUTE ONLY): 17 min   Charges:   PT Evaluation $PT Eval Low Complexity: 1 Low     PT G Codes:        Leighton Ruff, PT, DPT  Acute Rehabilitation Services  Pager: [phone]   Rudean Hitt 10/08/2017, 6:31 PM

## 2017-10-08 NOTE — Progress Notes (Signed)
Awake, alert, conversant.  Pain well controlled.  MAEW with full power bilateral PF/DF/EHL.  Doing well.

## 2017-10-08 NOTE — Op Note (Signed)
10/08/2017  1:17 PM  PATIENT:  David [name]  82 y.o. 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 It was elected to take him to surgery for right L 34 resection of synovial cyst.  Procedure: Patient was brought to the operating room and following the smooth and uncomplicated induction of general endotracheal anesthesia he was placed in a prone position on the Wilson frame. Low back was prepped and draped in the usual sterile fashion with betadine scrub and DuraPrep. Prior laminectomy had been performed with removal of L 4 spinous process, so we were able to palpate the L 34 level and therefore did not perform preoperative X ray.  Area of planned incision was infiltrated with local lidocaine. Incision was made in the midline and carried to the lumbodorsal fascia which was incised on the right side of midline. Subperiosteal dissection was performed exposing what was felt to be L 34 level. Intraoperative x-ray demonstrated marker probe at L 34.  A laminotomy of L 3 was performed as well as a foraminotomy overlying L 4.Ligamentum was mobilized and detached, exposing the common dural tube and  L 4  nerve root. The microscope was brought into the field and the L4 nerve root was mobilized medially and carefully dissected from the synovial cyst which was adherent to the dura.  Using microdissection, the cyst was removed and neural elements thoroughly decompressed. The cyst extended to the midline and was attached to a calcified portion of the medial aspect of the facet joint.  This was removed, with resultant significant decompress of the dura.    The cyst was exenterated and peeled from the dura. There was no violation of the dura. The laminectomy defect was extended to the superior aspect of the cyst and I was able to pass a variety of ball probes along the spinal canal cephalad to the uppermost aspect of the cyst, demonstrating adequate decompression.  Hemostasis was assured with bipolar electrocautery and the interspace was irrigated with Depo-Medrol and fentanyl. The lumbodorsal fascia was closed with 0 Vicryl sutures the subcutaneous tissues reapproximated 2-0 Vicryl inverted sutures and the skin edges were reapproximated with 3-0 Vicryl subcuticular stitch. The wound is dressed with Dermabond and an occlusive dressing. Patient was extubated in the operating room and taken to recovery in stable and satisfactory condition having tolerated his operation well counts were correct at the end of the case.   PLAN OF CARE: Admit to inpatient   PATIENT DISPOSITION:  PACU - hemodynamically stable.   Delay start of Pharmacological VTE agent (>24hrs) due to surgical blood loss or risk of bleeding: yes

## 2017-10-08 NOTE — Interval H&P Note (Signed)
History and Physical Interval Note:  10/08/2017 11:29 AM  David [name]  has presented today for surgery, with the diagnosis of Radiculopathy, Lumbar region; Synovial cyst  The various methods of treatment have been discussed with the patient and family. After consideration of risks, benefits and other options for treatment, the patient has consented to  Procedure(s) with comments: Right L3-4 Laminectomy for synovial cyst (Right) - Right L3-4 Laminectomy for synovial cyst as a surgical intervention .  The patient's history has been reviewed, patient examined, no change in status, stable for surgery.  I have reviewed the patient's chart and labs.  Questions were answered to the patient's satisfaction.     Antonella Upson D

## 2017-10-08 NOTE — Anesthesia Procedure Notes (Signed)
Procedure Name: Intubation Performed by: Verdie Drown, CRNA Pre-anesthesia Checklist: Patient identified, Emergency Drugs available, Suction available and Patient being monitored Patient Re-evaluated:Patient Re-evaluated prior to induction Oxygen Delivery Method: Circle System Utilized Preoxygenation: Pre-oxygenation with 100% oxygen Induction Type: IV induction Ventilation: Mask ventilation without difficulty Laryngoscope Size: Glidescope and 4 Grade View: Grade I Tube type: Oral Number of attempts: 1 Airway Equipment and Method: Rigid stylet Placement Confirmation: ETT inserted through vocal cords under direct vision,  positive ETCO2 and breath sounds checked- equal and bilateral Secured at: 21 cm Tube secured with: Tape Dental Injury: Teeth and Oropharynx as per pre-operative assessment

## 2017-10-08 NOTE — Transfer of Care (Signed)
Immediate Anesthesia Transfer of Care Note  Patient: David [name]  Procedure(s) Performed: Right Lumbar three-four Laminectomy for synovial cyst (Right Spine Lumbar)  Patient Location: PACU  Anesthesia Type:General  Level of Consciousness: drowsy and patient cooperative  Airway & Oxygen Therapy: Patient Spontanous Breathing  Post-op Assessment: Report given to RN, Post -op Vital signs reviewed and stable and Patient moving all extremities X 4  Post vital signs: Reviewed and stable  Last Vitals:  Vitals Value Taken Time  BP 114/60 10/08/2017  1:21 PM  Temp    Pulse 70 10/08/2017  1:22 PM  Resp 13 10/08/2017  1:22 PM  SpO2 96 % 10/08/2017  1:22 PM  Vitals shown include unvalidated device data.  Last Pain:  Vitals:   10/08/17 1013  TempSrc:   PainSc: 3       Patients Stated Pain Goal: 3 (97/98/92 1194)  Complications: No apparent anesthesia complications

## 2017-10-08 NOTE — Brief Op Note (Signed)
10/08/2017  1:17 PM  PATIENT:  David [name]  82 y.o. 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 It was elected to take him to surgery for right L 34 resection of synovial cyst.  Procedure: Patient was brought to the operating room and following the smooth and uncomplicated induction of general endotracheal anesthesia he was placed in a prone position on the Wilson frame. Low back was prepped and draped in the usual sterile fashion with betadine scrub and DuraPrep. Prior laminectomy had been performed with removal of L 4 spinous process, so we were able to palpate the L 34 level and therefore did not perform preoperative X ray.  Area of planned incision was infiltrated with local lidocaine. Incision was made in the midline and carried to the lumbodorsal fascia which was incised on the right side of midline. Subperiosteal dissection was performed exposing what was felt to be L 34 level. Intraoperative x-ray demonstrated marker probe at L 34.  A laminotomy of L 3 was performed as well as a foraminotomy overlying L 4.Ligamentum was mobilized and detached, exposing the common dural tube and  L 4  nerve root. The microscope was brought into the field and the L4 nerve root was mobilized medially and carefully dissected from the synovial cyst which was adherent to the dura.  Using microdissection, the cyst was removed and neural elements thoroughly decompressed. The cyst extended to the midline and was attached to a calcified portion of the medial aspect of the facet joint.  This was removed, with resultant significant decompress of the dura.    The cyst was exenterated and peeled from the dura. There was no violation of the dura. The laminectomy defect was extended to the superior aspect of the cyst and I was able to pass a variety of ball probes along the spinal canal cephalad to the uppermost aspect of the cyst, demonstrating adequate decompression.  Hemostasis was assured with bipolar electrocautery and the interspace was irrigated with Depo-Medrol and fentanyl. The lumbodorsal fascia was closed with 0 Vicryl sutures the subcutaneous tissues reapproximated 2-0 Vicryl inverted sutures and the skin edges were reapproximated with 3-0 Vicryl subcuticular stitch. The wound is dressed with Dermabond and an occlusive dressing. Patient was extubated in the operating room and taken to recovery in stable and satisfactory condition having tolerated his operation well counts were correct at the end of the case.   PLAN OF CARE: Admit to inpatient   PATIENT DISPOSITION:  PACU - hemodynamically stable.   Delay start of Pharmacological VTE agent (>24hrs) due to surgical blood loss or risk of bleeding: yes

## 2017-10-09 ENCOUNTER — Encounter (HOSPITAL_COMMUNITY): Payer: Self-pay | Admitting: Neurosurgery

## 2017-10-09 LAB — GLUCOSE, CAPILLARY: Glucose-Capillary: 142 mg/dL — ABNORMAL HIGH (ref 65–99)

## 2017-10-09 MED ORDER — ACETAMINOPHEN 500 MG PO TABS: 1000.0000 mg | ORAL_TABLET | Freq: Four times a day (QID) | ORAL | 0 refills | Status: DC | PRN

## 2017-10-09 MED FILL — Gelatin Absorbable MT Powder: OROMUCOSAL | Qty: 1 | Status: AC

## 2017-10-09 MED FILL — Thrombin For Soln 5000 Unit: CUTANEOUS | Qty: 5000 | Status: AC

## 2017-10-09 NOTE — Progress Notes (Addendum)
Subjective: Patient reports "That leg's not numb anymore"  Objective: Vital signs in last 24 hours: Temp:  [97.3 F (36.3 C)-99.1 F (37.3 C)] 98.3 F (36.8 C) (03/22 0734) Pulse Rate:  [50-77] 54 (03/22 0734) Resp:  [10-20] 18 (03/22 0734) BP: (100-149)/(43-79) 123/53 (03/22 0734) SpO2:  [93 %-98 %] 96 % (03/22 0734) Weight:  [88.2 kg (194 lb 8 oz)] 88.2 kg (194 lb 8 oz) (03/21 1013)  Intake/Output from previous day: 03/21 0701 - 03/22 0700 In: 1400 [P.O.:400; I.V.:1000] Out: 20 [Blood:20] Intake/Output this shift: No intake/output data recorded.  Alert, ambulates to room for evaluation. Reports no right leg numbness or weakness as preop. Notes only incisional soreness with ROM. Inspection reveals flat incision without erythema or bruising or drainage. Strength is full BLE.   Lab Results: No results for input(s): WBC, HGB, HCT, PLT in the last 72 hours. BMET No results for input(s): NA, K, CL, CO2, GLUCOSE, BUN, CREATININE, CALCIUM in the last 72 hours.  Studies/Results: Dg Lumbar Spine 1 View  Result Date: 10/08/2017 CLINICAL DATA:  L3-4 laminectomy for synovial cyst. EXAM: LUMBAR SPINE - 1 VIEW COMPARISON:  MRI of the lumbar spine of August 20, 2017. FINDINGS: There is a surgical trocar which projects over the tip of the L3 inferior arc articulating process. This is 3.4 cm posterior to the posterior margin of the L4 vertebral body. IMPRESSION: The metallic trocar is in position posterior to the L4 vertebral body as described. Electronically Signed   By: David  Martinique M.D.   On: 10/08/2017 13:58    Assessment/Plan: Improved  LOS: 1 day  Per Dr. Vertell Limber, d/c IV, d/c to home. Pt declines Rx offer, stating he has medication at home if OTC Tylenol is insufficient for his pain. He verbalizes understanding of d/c instructions and has f/u appt with Dr. Vertell Limber.    Verdis Prime 10/09/2017, 7:37 AM  Patient is doing well. Discharge home.

## 2017-10-09 NOTE — Progress Notes (Signed)
Patient alert and oriented, mae's well, voiding adequate amount of urine, swallowing without difficulty, no c/o pain at time of discharge. Patient discharged home with family. Script and discharged instructions given to patient. Patient and family stated understanding of instructions given. Patient has an appointment with Dr.Stern    

## 2017-10-09 NOTE — Progress Notes (Signed)
Physical Therapy Treatment and Discharge Patient Details Name: David [name] MRN: [id number] DOB: 21-Jun-1936 Today's Date: 10/09/2017    History of Present Illness Pt is a 82 y/o male s/p L3-4 laminectomy. PMH includes CAD s/p stent, PVD, MI, DM, and current smoker.     PT Comments    Pt progressing well with mobility. He was able to perform transfers and ambulation with supervision for safety up to modified independence without an AD. Pt was educated on precautions, car transfer, and safe activity progression. Pt is anticipating d/c home this morning, and is safe to do so from a PT perspective. Will sign off at this time. If needs change, please reconsult.    Follow Up Recommendations  No PT follow up;Supervision for mobility/OOB     Equipment Recommendations  None recommended by PT    Recommendations for Other Services       Precautions / Restrictions Precautions Precautions: Back Precaution Booklet Issued: Yes (comment) Precaution Comments: Reviewed back precautions with pt.  Restrictions Weight Bearing Restrictions: No    Mobility  Bed Mobility               General bed mobility comments: Pt received sitting EOB when PT arrived  Transfers Overall transfer level: Modified independent Equipment used: None Transfers: Sit to/from Stand Sit to Stand: Modified independent (Device/Increase time)         General transfer comment: Pt was able to power up to full stand without difficulty, LOB, or assist.  Ambulation/Gait Ambulation/Gait assistance: Modified independent (Device/Increase time) Ambulation Distance (Feet): 350 Feet Assistive device: None Gait Pattern/deviations: Step-through pattern;Trunk flexed Gait velocity: Decreased  Gait velocity interpretation: Below normal speed for age/gender General Gait Details: Pt ambulating well with VC's for improved posture as he was generally flexed throughout gait training. Pt with no overt LOB and did not require  any UE support on rails in hall.    Stairs Stairs: Yes   Stair Management: One rail Right;Alternating pattern;Forwards Number of Stairs: 10 General stair comments: VC's for general safety with stair negotiation. Recommended step-to pattern due to LLE weakness from symptoms prior to surgery however pt insisted on alternating step pattern and did well with it. No buckling noted.   Wheelchair Mobility    Modified Rankin (Stroke Patients Only)       Balance Overall balance assessment: Needs assistance Sitting-balance support: No upper extremity supported;Feet supported Sitting balance-Leahy Scale: Good     Standing balance support: Bilateral upper extremity supported;During functional activity Standing balance-Leahy Scale: Poor Standing balance comment: Reliant on UE support                             Cognition Arousal/Alertness: Awake/alert Behavior During Therapy: WFL for tasks assessed/performed Overall Cognitive Status: Within Functional Limits for tasks assessed                                        Exercises      General Comments        Pertinent Vitals/Pain Pain Assessment: Faces Faces Pain Scale: Hurts a little bit Pain Location: back - incision site Pain Descriptors / Indicators: Aching;Operative site guarding Pain Intervention(s): Monitored during session    Home Living                      Prior Function  PT Goals (current goals can now be found in the care plan section) Acute Rehab PT Goals Patient Stated Goal: to go home today PT Goal Formulation: With patient Time For Goal Achievement: 10/22/17 Potential to Achieve Goals: Good Progress towards PT goals: Progressing toward goals    Frequency    Min 5X/week      PT Plan Current plan remains appropriate    Co-evaluation              AM-PAC PT "6 Clicks" Daily Activity  Outcome Measure  Difficulty turning over in bed (including  adjusting bedclothes, sheets and blankets)?: None Difficulty moving from lying on back to sitting on the side of the bed? : A Little Difficulty sitting down on and standing up from a chair with arms (e.g., wheelchair, bedside commode, etc,.)?: A Little Help needed moving to and from a bed to chair (including a wheelchair)?: None Help needed walking in hospital room?: None Help needed climbing 3-5 steps with a railing? : A Little 6 Click Score: 21    End of Session Equipment Utilized During Treatment: Gait belt Activity Tolerance: Patient tolerated treatment well Patient left: in chair;with call bell/phone within reach Nurse Communication: Mobility status PT Visit Diagnosis: Unsteadiness on feet (R26.81);Other abnormalities of gait and mobility (R26.89)     Time: 1610-9604 PT Time Calculation (min) (ACUTE ONLY): 10 min  Charges:  $Gait Training: 8-22 mins                    G Codes:       Rolinda Roan, PT, DPT Acute Rehabilitation Services Pager: [phone]    Thelma Comp 10/09/2017, 8:01 AM

## 2017-10-09 NOTE — Evaluation (Signed)
Occupational Therapy Evaluation Patient Details Name: STOKELY JEANCHARLES MRN: [id number] DOB: Jan 21, 1936 Today's Date: 10/09/2017    History of Present Illness Pt is a 82 y/o male s/p L3-4 laminectomy. PMH includes CAD s/p stent, PVD, MI, DM, and current smoker.    Clinical Impression   Patient evaluated by Occupational Therapy with no further acute OT needs identified. All education has been completed and the patient has no further questions. See below for any follow-up Occupational Therapy or equipment needs. OT to sign off. Thank you for referral.      Follow Up Recommendations  No OT follow up    Equipment Recommendations  None recommended by OT    Recommendations for Other Services       Precautions / Restrictions Precautions Precautions: Back Precaution Booklet Issued: Yes (comment) Precaution Comments: reviewed for ADls with back precautions Restrictions Weight Bearing Restrictions: No      Mobility Bed Mobility               General bed mobility comments: in chair on arrival  Transfers Overall transfer level: Modified independent Equipment used: None Transfers: Sit to/from Stand Sit to Stand: Modified independent (Device/Increase time)         General transfer comment: Pt was able to power up to full stand without difficulty, LOB, or assist.    Balance Overall balance assessment: Needs assistance Sitting-balance support: No upper extremity supported;Feet supported Sitting balance-Leahy Scale: Good     Standing balance support: Bilateral upper extremity supported;During functional activity Standing balance-Leahy Scale: Poor Standing balance comment: Reliant on UE support                            ADL either performed or assessed with clinical judgement   ADL Overall ADL's : Modified independent                                       General ADL Comments: pt demonstrates shower transfers and advised to use warm water  with no steam to decr fall risk. pt able to complete transfer with BIL UE. pt static standing in shower iwth vision occlusion without sway. pt was fully dressed upon arrival. Pt able to cross bil LE.    Back handout provided and reviewed adls in detail. Pt educated on: set an alarm at night for medication, avoid sitting for long periods of time, correct bed positioning for sleeping, correct sequence for bed mobility, avoiding lifting more than 5 pounds and never wash directly over incision. All education is complete and patient indicates understanding. Pt reports this was my 3rd surgery and "they told me there is another cyst in there so I am going to be real careful to these instructions. This is the most instructions I have every gotten after a schedule like this. "    Vision Baseline Vision/History: Wears glasses Wears Glasses: At all times       Perception     Praxis      Pertinent Vitals/Pain Pain Assessment: Faces Faces Pain Scale: Hurts a little bit Pain Location: back - incision site Pain Descriptors / Indicators: Aching;Operative site guarding Pain Intervention(s): Monitored during session;Premedicated before session;Repositioned     Hand Dominance Right   Extremity/Trunk Assessment Upper Extremity Assessment Upper Extremity Assessment: Overall WFL for tasks assessed   Lower Extremity Assessment Lower Extremity Assessment: Defer to PT  evaluation   Cervical / Trunk Assessment Cervical / Trunk Assessment: Other exceptions Cervical / Trunk Exceptions: s/p lumbar surgery    Communication Communication Communication: No difficulties   Cognition Arousal/Alertness: Awake/alert Behavior During Therapy: WFL for tasks assessed/performed Overall Cognitive Status: Within Functional Limits for tasks assessed                                     General Comments       Exercises     Shoulder Instructions      Home Living Family/patient expects to be  discharged to:: Private residence Living Arrangements: Spouse/significant other Available Help at Discharge: Family;Available 24 hours/day Type of Home: House Home Access: Stairs to enter [employer] of Steps: 6-8 Entrance Stairs-Rails: Right Home Layout: Two level Alternate Level Stairs-Number of Steps: 2-3 Alternate Level Stairs-Rails: None Bathroom Shower/Tub: Occupational psychologist: Standard     Home Equipment: Shower seat;Bedside commode   Additional Comments: reports his dog passed last July and it was the best dog he ever had =(      Prior Functioning/Environment Level of Independence: Independent        Comments: married 60 years nearly         OT Problem List:        OT Treatment/Interventions:      OT Goals(Current goals can be found in the care plan section) Acute Rehab OT Goals Patient Stated Goal: to go home today  OT Frequency:     Barriers to D/C:            Co-evaluation              AM-PAC PT "6 Clicks" Daily Activity     Outcome Measure Help from another person eating meals?: None Help from another person taking care of personal grooming?: None Help from another person toileting, which includes using toliet, bedpan, or urinal?: None Help from another person bathing (including washing, rinsing, drying)?: None Help from another person to put on and taking off regular upper body clothing?: None Help from another person to put on and taking off regular lower body clothing?: None 6 Click Score: 24   End of Session Nurse Communication: Mobility status;Precautions  Activity Tolerance: Patient tolerated treatment well Patient left: in chair;with call bell/phone within reach  OT Visit Diagnosis: Unsteadiness on feet (R26.81)                Time: 9326-7124 OT Time Calculation (min): 13 min Charges:  OT General Charges $OT Visit: 1 Visit OT Evaluation $OT Eval Low Complexity: 1 Low G-CodesJeri Modena    OTR/L Pager: [phone] Office: [phone] .   Parke Poisson B 10/09/2017, 8:49 AM

## 2017-10-09 NOTE — Discharge Summary (Signed)
Physician Discharge Summary  Patient ID: MONIQUE GIFT MRN: [id number] DOB/AGE: 23-Oct-1935 82 y.o.  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 Following uncomplicated laminectomy Right L3-4 and excision of cyst, he recovered nicely and transferred to [facility]. He is mobilizing well.  Consults: None  Significant Diagnostic Studies: radiology: X-Ray: intra-op  Treatments: surgery: Right Lumbar three-four Laminectomy for synovial cyst (Right) with microdissection    Discharge Exam: Blood pressure (!) 123/53, pulse (!) 54, temperature 98.3 F (36.8 C), temperature source Oral, resp. rate 18, height 5' 10.5" (1.791 m), weight 88.2 kg (194 lb 8 oz), SpO2 96 %. Alert, ambulates to room for evaluation. Reports no right leg numbness or weakness as preop. Notes only incisional soreness with ROM. Inspection reveals flat incision without erythema or bruising or drainage. Strength is full BLE.    Disposition:  Discharge to home. Pt declines Rx offer, stating he has medication at home if OTC Tylenol is insufficient for his pain. He verbalizes understanding of d/c instructions and has f/u appt with Dr. Vertell Limber.     Allergies as of 10/09/2017      Reactions   Atorvastatin Other (See Comments)   MYALGIAS CRAMPING   Crestor [rosuvastatin]    MYALGIAS CRAMPING   Statins    MYALGIAS CRAMPING   Tetanus Toxoids    UNSPECIFIED REACTION       Medication List    STOP taking these medications   clopidogrel 75 MG tablet Commonly known as:  PLAVIX     TAKE these medications   acetaminophen 500 MG tablet Commonly known as:  TYLENOL Take 2 tablets (1,000 mg total) by mouth every 6 (six) hours as needed for mild pain.   aspirin EC 81 MG tablet Take 1 tablet (81 mg total) by mouth every other day. What changed:  when to take this   CINNAMON PO Take 1,000 mg by mouth daily.   doxazosin 4 MG tablet Commonly known as:  CARDURA TAKE 1 TABLET BY MOUTH AT BEDTIME   ezetimibe 10 MG tablet Commonly known as:  ZETIA TAKE 1 TABLET BY MOUTH EVERY DAY   finasteride 5 MG tablet Commonly known as:  PROSCAR Take 5 mg by mouth daily.   lisinopril 5 MG tablet Commonly known as:  PRINIVIL,ZESTRIL TAKE 1 TABLET BY MOUTH DAILY.   Magnesium 500 MG Caps Take 1 capsule by mouth daily.   metFORMIN 500 MG 24 hr tablet Commonly known as:  GLUCOPHAGE-XR TAKE 2 TABLETS BY MOUTH TWICE DAILY   Vitamin D 2000 units Caps Take 2 capsules by mouth.        Signed: Peggyann Shoals, MD 10/09/2017, 11:56 AM

## 2017-10-20 ENCOUNTER — Other Ambulatory Visit: Payer: Self-pay | Admitting: Internal Medicine

## 2017-10-20 ENCOUNTER — Encounter: Payer: Self-pay | Admitting: Internal Medicine

## 2017-10-21 NOTE — Progress Notes (Signed)
[place]     This very nice 82 y.o. MWM was admitted to the hospital on 30.21.2019  and patient was discharged from the hospital on  03.22.2019. The patient now presents 13 days post discharge for follow up for transition from recent hospitalization.  The day after discharge  our clinical staff contacted the patient to assure stability and schedule a follow up appointment. The discharge summary, medications and diagnostic test results were reviewed before meeting with the patient. The patient was admitted for:   (1) Lumbar Radiculopathy (2) Synovial cyst L3/L4 Right (3) Spinal Stenosis (4) Lumbago (5) HTN (6) HLD (7) ASCAD (8) T2_NIDDM     Patient was hospitalized for a 3rd synovial cystectomy by Dr Vertell Limber  for unrelenting pain. Prior Lumbar surgeries x 2 in Jan & July 2017.      Hospitalization discharge instructions and medications are reconciled with the patient.      Patient is also followed with Hypertension, Hyperlipidemia, Pre-Diabetes and Vitamin D Deficiency.      Patient is treated for HTN (2000)  & BP has been controlled at home. Today's BP is at goal -  124/72.  Patient has hx/o PCA /DES in 2010 & 2011. Patient has had no recent complaints of any cardiac type chest pain, palpitations, dyspnea/orthopnea/PND, dizziness, claudication, or dependent edema.     Hyperlipidemia is controlled with diet & meds. Patient denies myalgias or other med SE's. Last Lipids were near goal: Lab Results  Component Value Date   CHOL 173 07/15/2017   HDL 56 07/15/2017   LDLCALC 94 07/15/2017   TRIG 125 07/15/2017   CHOLHDL 3.1 07/15/2017      Also, the patient has history of T2_NIDDM (2006) w/CKD2 (GFR 84) and controlled on Metformin and has had no symptoms of reactive hypoglycemia, diabetic polys, paresthesias or visual blurring.  Last A1c in hospital was near goal: Lab Results  Component Value Date   HGBA1C 5.7 (H) 09/29/2017      Further, the patient also has history of  Vitamin D Deficiency ("22"/2008) and supplements vitamin D without any suspected side-effects. Last vitamin D was sl low (goal 70-100): Lab Results  Component Value Date   VD25OH 52 07/15/2017   Current Outpatient Medications on File Prior to Visit  Medication Sig  . aspirin EC 81 MG tablet Take 1 tablet (81 mg total) by mouth every other day. (Patient taking differently: Take 81 mg by mouth daily. )  . Cholecalciferol (VITAMIN D) 2000 units CAPS Take 2 capsules by mouth.  Marland Kitchen CINNAMON PO Take 1,000 mg by mouth daily.   Marland Kitchen doxazosin (CARDURA) 4 MG tablet TAKE 1 TABLET BY MOUTH AT BEDTIME  . ezetimibe (ZETIA) 10 MG tablet TAKE 1 TABLET BY MOUTH EVERY DAY  . finasteride (PROSCAR) 5 MG tablet Take 5 mg by mouth daily.  Marland Kitchen lisinopril (PRINIVIL,ZESTRIL) 5 MG tablet TAKE 1 TABLET BY MOUTH DAILY.  . Magnesium 500 MG CAPS Take 1 capsule by mouth daily.  . metFORMIN (GLUCOPHAGE-XR) 500 MG 24 hr tablet TAKE 2 TABLETS BY MOUTH TWICE A DAY   No current facility-administered medications on file prior to visit.    Allergies  Allergen Reactions  . Atorvastatin Other (See Comments)    MYALGIAS CRAMPING  . Crestor [Rosuvastatin]     MYALGIAS CRAMPING  . Statins     MYALGIAS CRAMPING  . Tetanus Toxoids     UNSPECIFIED REACTION    PMHx:   Past Medical History:  Diagnosis Date  .  Arthritis   . BPH (benign prostatic hyperplasia)   . CAD S/P percutaneous coronary angioplasty 12/2009; 08/2010   a) 6/'22 - NSTEMI: PCI to LAD: Promus Element 2.5 mm x 15 mm DES; b) Cath for Angina: Prox LAD 60-70% pre-stent with FFR 0.82, 90% RVM -- Med Rx, EF 50-55%  . Essential hypertension   . Hyperlipidemia with target LDL less than 70    Statin intolerance  . Non-ST elevation MI (NSTEMI) [facility]) June 2011   PCI to LAD  . Pneumonia    DEC 2016  TX WITH ANTIBIOTIC  . Type 2 diabetes mellitus without complications ([place])    Immunization History  Administered Date(s) Administered  . Influenza, High Dose Seasonal PF  06/01/2013, 04/18/2014, 05/23/2015, 05/20/2016, 04/10/2017  . Influenza-Unspecified 05/13/2011  . Pneumococcal Conjugate-13 04/18/2014  . Pneumococcal Polysaccharide-23 07/21/2002, 05/23/2015  . Td 07/21/2000   Past Surgical History:  Procedure Laterality Date  . CARDIAC CATHETERIZATION  12/2009   Proximal LAD stenosis followed by a significant 80-90% distal stenosis  . CARDIAC CATHETERIZATION  February 2012    90% ostial RV marginal branch; 60-70% proximal LAD with widely patent distal stent. FFR 0.82; medical therapy  . CATARACT EXTRACTION W/ INTRAOCULAR LENS  IMPLANT, BILATERAL    . CORONARY ANGIOPLASTY  12/2009   PTCA to proximal LAD; PCI with Promus Element DES stent  2.5 mm x 15 mm  distalLAD - .for non-ST elevation MI  . DENTAL SURGERY    . LUMBAR LAMINECTOMY/DECOMPRESSION MICRODISCECTOMY N/A 08/09/2015   Procedure: Lumbar Four-Five, Lumbar Five- Sacral One Decompressive Lumbar Laminectomy with Resection of Synovial Cyst;  Surgeon: Erline Levine, MD;  Location: [place] [facility];  Service: Neurosurgery;  Laterality: N/A;  L4 to S1 Decompressive Lumbar Laminectomy  . LUMBAR LAMINECTOMY/DECOMPRESSION MICRODISCECTOMY Right 01/21/2016   Procedure: Redo Right Lumbar Five-Sacral One Laminectomy for synovial cyst;  Surgeon: Erline Levine, MD;  Location: [place] [facility];  Service: Neurosurgery;  Laterality: Right;  right  . LUMBAR LAMINECTOMY/DECOMPRESSION MICRODISCECTOMY Right 10/08/2017   Procedure: Right Lumbar three-four Laminectomy for synovial cyst;  Surgeon: Erline Levine, MD;  Location: [place];  Service: Neurosurgery;  Laterality: Right;  . NASAL FRACTURE SURGERY    . TRANSTHORACIC ECHOCARDIOGRAM  June 2011   - (EF not reported) Moderately dilated LV; moderate hypokinesis of anteroseptal and anterior wall consistent with MI. Grade 1 diastolic dysfunction. Mild to moderately dilated LA  . VASECTOMY     FHx:    Reviewed / unchanged  SHx:    Reviewed / unchanged  Systems Review:  Constitutional:  Denies fever, chills, wt changes, headaches, insomnia, fatigue, night sweats, change in appetite. Eyes: Denies redness, blurred vision, diplopia, discharge, itchy, watery eyes.  ENT: Denies discharge, congestion, post nasal drip, epistaxis, sore throat, earache, hearing loss, dental pain, tinnitus, vertigo, sinus pain, snoring.  CV: Denies chest pain, palpitations, irregular heartbeat, syncope, dyspnea, diaphoresis, orthopnea, PND, claudication or edema. Respiratory: denies cough, dyspnea, DOE, pleurisy, hoarseness, laryngitis, wheezing.  Gastrointestinal: Denies dysphagia, odynophagia, heartburn, reflux, water brash, abdominal pain or cramps, nausea, vomiting, bloating, diarrhea, constipation, hematemesis, melena, hematochezia  or hemorrhoids. Genitourinary: Denies dysuria, frequency, urgency, nocturia, hesitancy, discharge, hematuria or flank pain. Musculoskeletal: Denies arthralgias, myalgias, stiffness, jt. swelling, pain, limping or strain/sprain.  Skin: Denies pruritus, rash, hives, warts, acne, eczema or change in skin lesion(s). Neuro: No weakness, tremor, incoordination, spasms, paresthesia or pain. Psychiatric: Denies confusion, memory loss or sensory loss. Endo: Denies change in weight, skin or hair change.  Heme/Lymph: No excessive bleeding, bruising or  enlarged lymph nodes.  Physical Exam  BP 124/72   Pulse 72   Temp (!) 97.1 F (36.2 C)   Resp 18   Ht 5' 10.5" (1.791 m)   Wt 189 lb 6.4 oz (85.9 kg)   BMI 26.79 kg/m   Appears well nourished, well groomed  and in no distress.  Eyes: PERRLA, EOMs, conjunctiva no swelling or erythema. Sinuses: No frontal/maxillary tenderness ENT/Mouth: EAC's clear, TM's nl w/o erythema, bulging. Nares clear w/o erythema, swelling, exudates. Oropharynx clear without erythema or exudates. Oral hygiene is good. Tongue normal, non obstructing. Hearing intact.  Neck: Supple. Thyroid nl. Car 2+/2+ without bruits, nodes or JVD. Chest: Respirations  nl with BS clear & equal w/o rales, rhonchi, wheezing or stridor.  Cor: Heart sounds normal w/ regular rate and rhythm without sig. murmurs, gallops, clicks or rubs. Peripheral pulses normal and equal  without edema.  Abdomen: Soft & bowel sounds normal. Non-tender w/o guarding, rebound, hernias, masses or organomegaly.  Lymphatics: Unremarkable.  Musculoskeletal: Full ROM all peripheral extremities, joint stability, 5/5 strength and normal gait.  Skin: Warm, dry without exposed rashes, lesions or ecchymosis apparent.  Neuro: Cranial nerves intact, reflexes equal bilaterally. Sensory-motor testing grossly intact. Tendon reflexes grossly intact.  Pysch: Alert & oriented x 3.  Insight and judgement nl & appropriate. No ideations.  Assessment and Plan:  1. Lumbar radiculopathy   2. Synovial cyst of lumbar facet joint   3. Spinal stenosis of lumbar region without neurogenic claudication   4. Right-sided low back pain with right-sided sciatica, unspecified chronicity   5. Essential hypertension  - Continue medication, monitor blood pressure at home.  - Continue DASH diet. Reminder to go to the ER if any CP,  SOB, nausea, dizziness, severe HA, changes vision/speech.  - CBC with Differential/Platelet - BASIC METABOLIC PANEL WITH GFR - Magnesium - TSH  6. Hyperlipidemia, mixed  - Continue diet/meds, exercise,& lifestyle modifications.  - Continue monitor periodic cholesterol/liver & renal functions   - Lipid panel - TSH  7. Type 2 diabetes mellitus with stage 2 chronic kidney disease, without long-term current use of insulin (HCC)  - Continue diet, exercise, lifestyle modifications.  - Monitor appropriate labs.  - Insulin, random  8. Vitamin D deficiency   - Continue supplementation.   - VITAMIN D 25 Hydroxyl  9. Arteriosclerotic heart disease (ASHD)  - Lipid panel  10. Type 2 diabetes mellitus with complication, without long-term current use of insulin (HCC)  -  Lipid panel - TSH  11. Abscess of buttock  - cephALEXin (KEFLEX) 500 MG capsule; Take 1 capsule 4 x / day with meals & bedtime for skin infection  Dispense: 60 capsule; Refill: 1 - erythromycin with ethanol (THERAMYCIN) 2 % external solution; Apply topically daily. Apply to skin infection 3 to 4 x / day  Dispense: 60 mL; Refill: 3 - erythromycin with ethanol (EMGEL) 2 % gel; Apply topically daily. Apply to skin infection 3 - 4  x / day  Dispense: 60 g; Refill: 3  12. Medication management  - CBC with Differential/Platelet - BASIC METABOLIC PANEL WITH GFR - Magnesium - Lipid panel - TSH - Insulin, random - VITAMIN D 25 Hydroxyl      Discussed  regular exercise, BP monitoring, weight control to achieve/maintain BMI less than 25 and discussed meds and SE's. Recommended labs to assess and monitor clinical status with further disposition pending results of labs. Over 30 minutes of exam, counseling, chart review was performed.

## 2017-10-21 NOTE — Patient Instructions (Signed)

## 2017-10-22 ENCOUNTER — Encounter: Payer: Self-pay | Admitting: Internal Medicine

## 2017-10-22 ENCOUNTER — Ambulatory Visit (INDEPENDENT_AMBULATORY_CARE_PROVIDER_SITE_OTHER): Payer: Medicare Other | Admitting: Internal Medicine

## 2017-10-22 VITALS — BP 124/72 | HR 72 | Temp 97.1°F | Resp 18 | Ht 70.5 in | Wt 189.4 lb

## 2017-10-22 DIAGNOSIS — M48061 Spinal stenosis, lumbar region without neurogenic claudication: Secondary | ICD-10-CM

## 2017-10-22 DIAGNOSIS — M5441 Lumbago with sciatica, right side: Secondary | ICD-10-CM | POA: Diagnosis not present

## 2017-10-22 DIAGNOSIS — E559 Vitamin D deficiency, unspecified: Secondary | ICD-10-CM

## 2017-10-22 DIAGNOSIS — L0231 Cutaneous abscess of buttock: Secondary | ICD-10-CM | POA: Diagnosis not present

## 2017-10-22 DIAGNOSIS — M5416 Radiculopathy, lumbar region: Secondary | ICD-10-CM

## 2017-10-22 DIAGNOSIS — E782 Mixed hyperlipidemia: Secondary | ICD-10-CM | POA: Diagnosis not present

## 2017-10-22 DIAGNOSIS — I10 Essential (primary) hypertension: Secondary | ICD-10-CM

## 2017-10-22 DIAGNOSIS — I2510 Atherosclerotic heart disease of native coronary artery without angina pectoris: Secondary | ICD-10-CM | POA: Diagnosis not present

## 2017-10-22 DIAGNOSIS — Z79899 Other long term (current) drug therapy: Secondary | ICD-10-CM

## 2017-10-22 DIAGNOSIS — N182 Chronic kidney disease, stage 2 (mild): Secondary | ICD-10-CM

## 2017-10-22 DIAGNOSIS — M7138 Other bursal cyst, other site: Secondary | ICD-10-CM | POA: Diagnosis not present

## 2017-10-22 DIAGNOSIS — E1122 Type 2 diabetes mellitus with diabetic chronic kidney disease: Secondary | ICD-10-CM

## 2017-10-22 DIAGNOSIS — E118 Type 2 diabetes mellitus with unspecified complications: Secondary | ICD-10-CM

## 2017-10-22 MED ORDER — CEPHALEXIN 500 MG PO CAPS: ORAL_CAPSULE | ORAL | 1 refills | Status: DC

## 2017-10-22 MED ORDER — ERYTHROMYCIN 2 % EX SOLN: Freq: Every day | CUTANEOUS | 3 refills | Status: DC

## 2017-10-22 MED ORDER — ERYTHROMYCIN 2 % EX GEL: Freq: Every day | CUTANEOUS | 3 refills | Status: DC

## 2017-10-23 LAB — CBC WITH DIFFERENTIAL/PLATELET
BASOS PCT: 0.6 %
Basophils Absolute: 67 cells/uL (ref 0–200)
EOS ABS: 325 {cells}/uL (ref 15–500)
Eosinophils Relative: 2.9 %
HEMATOCRIT: 47.3 % (ref 38.5–50.0)
Hemoglobin: 16.1 g/dL (ref 13.2–17.1)
LYMPHS ABS: 1691 {cells}/uL (ref 850–3900)
MCH: 31.1 pg (ref 27.0–33.0)
MCHC: 34 g/dL (ref 32.0–36.0)
MCV: 91.5 fL (ref 80.0–100.0)
MPV: 10.6 fL (ref 7.5–12.5)
Monocytes Relative: 9.6 %
Neutro Abs: 8042 cells/uL — ABNORMAL HIGH (ref 1500–7800)
Neutrophils Relative %: 71.8 %
Platelets: 272 10*3/uL (ref 140–400)
RBC: 5.17 10*6/uL (ref 4.20–5.80)
RDW: 12.3 % (ref 11.0–15.0)
Total Lymphocyte: 15.1 %
WBC: 11.2 10*3/uL — AB (ref 3.8–10.8)
WBCMIX: 1075 {cells}/uL — AB (ref 200–950)

## 2017-10-23 LAB — INSULIN, RANDOM: Insulin: 6.1 u[IU]/mL (ref 2.0–19.6)

## 2017-10-23 LAB — BASIC METABOLIC PANEL WITH GFR
BUN: 13 mg/dL (ref 7–25)
CALCIUM: 9.7 mg/dL (ref 8.6–10.3)
CO2: 29 mmol/L (ref 20–32)
CREATININE: 0.78 mg/dL (ref 0.70–1.18)
Chloride: 101 mmol/L (ref 98–110)
GFR, EST AFRICAN AMERICAN: 100 mL/min/{1.73_m2} (ref >=60)
GFR, EST NON AFRICAN AMERICAN: 86 mL/min/{1.73_m2} (ref >=60)
Glucose, Bld: 84 mg/dL (ref 65–99)
Potassium: 4.2 mmol/L (ref 3.5–5.3)
Sodium: 137 mmol/L (ref 135–146)

## 2017-10-23 LAB — LIPID PANEL
CHOL/HDL RATIO: 3.6 (calc) (ref <5.0)
Cholesterol: 182 mg/dL (ref <200)
HDL: 50 mg/dL (ref >40)
LDL CHOLESTEROL (CALC): 108 mg/dL — AB
NON-HDL CHOLESTEROL (CALC): 132 mg/dL — AB (ref <130)
TRIGLYCERIDES: 128 mg/dL (ref <150)

## 2017-10-23 LAB — MAGNESIUM: Magnesium: 2.1 mg/dL (ref 1.5–2.5)

## 2017-10-23 LAB — VITAMIN D 25 HYDROXY (VIT D DEFICIENCY, FRACTURES): Vit D, 25-Hydroxy: 52 ng/mL (ref 30–100)

## 2017-10-23 LAB — TSH: TSH: 2.23 mIU/L (ref 0.40–4.50)

## 2017-11-02 DIAGNOSIS — E113292 Type 2 diabetes mellitus with mild nonproliferative diabetic retinopathy without macular edema, left eye: Secondary | ICD-10-CM | POA: Diagnosis not present

## 2017-11-02 DIAGNOSIS — H40013 Open angle with borderline findings, low risk, bilateral: Secondary | ICD-10-CM | POA: Diagnosis not present

## 2017-11-02 DIAGNOSIS — Z961 Presence of intraocular lens: Secondary | ICD-10-CM | POA: Diagnosis not present

## 2017-11-02 DIAGNOSIS — H35362 Drusen (degenerative) of macula, left eye: Secondary | ICD-10-CM | POA: Diagnosis not present

## 2017-11-02 LAB — HM DIABETES EYE EXAM

## 2017-12-01 ENCOUNTER — Encounter: Payer: Self-pay | Admitting: Internal Medicine

## 2018-02-01 DIAGNOSIS — E11319 Type 2 diabetes mellitus with unspecified diabetic retinopathy without macular edema: Secondary | ICD-10-CM | POA: Insufficient documentation

## 2018-02-01 NOTE — Progress Notes (Signed)
MEDICARE ANNUAL WELLNESS VISIT AND FOLLOW UP Assessment:   Medicare annual wellness visit  CAD S/P percutaneous coronary angioplasty and PCI- DES mid LAD in the setting of non-STEMI -followed by cards -medically controlled currently   Essential hypertension -Well controlled currently -cont meds -DASH diet -monitor at home - TSH  Type II diabetes mellitus with nephropathy [facility]) Education: Reviewed 'ABCs' of diabetes management (respective goals in parentheses):  A1C (<7), blood pressure (<130/80), and cholesterol (LDL <70) Eye Exam yearly and Dental Exam every 6 months. Dietary recommendations Physical Activity recommendations - Hemoglobin A1c - foot exam done  Retinopathy associated with T2DM Aggressively control sugars and BP; followed by ophthalmology   Synovial cyst of lumbar facet joint -resolved s/p surgery for removal Followed by Dr. Vertell Limber   BPH (benign prostatic hyperplasia) -cont meds -not currently symptomatic with meds on board   Hyperlipidemia with statin intolerance -cont meds - Lipid panel  Tobacco dependency -not ready to quit - get CXR at [facility]  Vitamin D deficiency -cont supplement  Medication management - CBC with Differential/Platelet - CMP/GFR  Aortic Atherosclerosis Control blood pressure, cholesterol, glucose, increase exercise.  Advised to quit smoking  Over 30 minutes of exam, counseling, chart review, and critical decision making was performed  Future Appointments  Date Time Provider [place]  03/23/2018  4:20 PM Leonie Man, MD [facility]  05/14/2018 11:00 AM [name], MD [facility] None  11/24/2018 10:00 AM [name], MD [facility] None     Plan:   During the course of the visit the patient was educated and counseled about appropriate screening and preventive services including:    Pneumococcal vaccine   Influenza vaccine  Prevnar 13  Td vaccine  Screening  electrocardiogram  Colorectal cancer screening  Diabetes screening  Glaucoma screening  Nutrition counseling    Subjective:  David [name] is a 82 y.o. male who presents for Medicare Annual Wellness Visit and 3 month follow up for HTN, hyperlipidemia, T2 diabetes, and vitamin D Def.   Patient reports that he has been recovering well from back surgery in 09/2017 by Dr. Vertell Limber to have a cyst removed from his back. He is still recovering from this, reports he does still have some pain in his R leg, but has sensation back.   BMI is Body mass index is 27.73 kg/m., he has been working on diet, exercise limited. He watches bread and sugar intake.  Wt Readings from Last 3 Encounters:  02/03/18 196 lb (88.9 kg)  10/22/17 189 lb 6.4 oz (85.9 kg)  10/08/17 194 lb 8 oz (88.2 kg)   His blood pressure has been controlled at home, today their BP is BP: 124/74 He does workout, but limited. He denies chest pain, shortness of breath, dizziness.  Smokes pipe daily. He is not ready to cut back or quit.   He is on cholesterol medication (zetia due to statin intolerance) and denies myalgias. His cholesterol is at goal. The cholesterol last visit was:   Lab Results  Component Value Date   CHOL 182 10/22/2017   HDL 50 10/22/2017   LDLCALC 108 (H) 10/22/2017   TRIG 128 10/22/2017   CHOLHDL 3.6 10/22/2017   He has been working on diet and exercise for T2 diabetes controlled on metformin, and denies foot ulcerations, hyperglycemia, hypoglycemia , increased appetite, nausea, paresthesia of the feet, polydipsia, polyuria, visual disturbances, vomiting and weight loss. Last A1C in the office was:  Lab Results  Component Value Date   HGBA1C 5.7 (  H) 09/29/2017   Last GFR Lab Results  Component Value Date   GFRNONAA 86 10/22/2017    Patient is on Vitamin D supplement.   Lab Results  Component Value Date   VD25OH 52 10/22/2017        Medication Review: Current Outpatient Medications on File  Prior to Visit  Medication Sig Dispense Refill  . aspirin EC 81 MG tablet Take 1 tablet (81 mg total) by mouth every other day. 30 tablet 11  . Cholecalciferol (VITAMIN D) 2000 units CAPS Take 2 capsules by mouth.    Marland Kitchen CINNAMON PO Take 1,000 mg by mouth daily.     Marland Kitchen doxazosin (CARDURA) 4 MG tablet TAKE 1 TABLET BY MOUTH AT BEDTIME 90 tablet 1  . ezetimibe (ZETIA) 10 MG tablet TAKE 1 TABLET BY MOUTH EVERY DAY 90 tablet 1  . finasteride (PROSCAR) 5 MG tablet Take 5 mg by mouth daily.    Marland Kitchen lisinopril (PRINIVIL,ZESTRIL) 5 MG tablet TAKE 1 TABLET BY MOUTH DAILY. 90 tablet 3  . Magnesium 500 MG CAPS Take 1 capsule by mouth daily.    . metFORMIN (GLUCOPHAGE-XR) 500 MG 24 hr tablet TAKE 2 TABLETS BY MOUTH TWICE A DAY 360 tablet 1   No current facility-administered medications on file prior to visit.     Current Problems (verified) Patient Active Problem List   Diagnosis Date Noted  . Retinopathy, diabetic, bilateral ([place]) 02/01/2018  . Aortic atherosclerosis ([place]) 01/08/2017  . Synovial cyst of lumbar facet joint 08/09/2015  . BPH (benign prostatic hyperplasia) 11/16/2014  . Vitamin D deficiency 12/25/2013  . Medication management 12/25/2013  . Overweight (BMI 25.0-29.9) 02/23/2013    Class: Chronic  . Tobacco dependency 02/22/2013  . Hyperlipidemia with statin intolerance; goal LDL < 70   . Essential hypertension   . Type II diabetes mellitus with nephropathy ([place])   . CAD S/P percutaneous coronary angioplasty and PCI- DES mid LAD in the setting of non-STEMI 12/31/2009    Screening Tests Immunization History  Administered Date(s) Administered  . Influenza, High Dose Seasonal PF 06/01/2013, 04/18/2014, 05/23/2015, 05/20/2016, 04/10/2017  . Influenza-Unspecified 05/13/2011  . Pneumococcal Conjugate-13 04/18/2014  . Pneumococcal Polysaccharide-23 07/21/2002, 05/23/2015  . Td 07/21/2000   Preventative care: Last colonoscopy: 2015  Prior vaccinations: TD or Tdap: 2002, allergy to  TDAP/TD Influenza: 2018  Pneumococcal: 2016 Prevnar13: 2015 Shingles/Zostavax: Declined  Names of Other Physician/Practitioners you currently use: 1. [facility] here for primary care 2. Dr. Herbert Deaner, eye doctor, last visit 11/02/2017 - report verified and abstracted 3. Judeth Horn, dentist, last visit May 2019  Patient Care Team: [name], MD as PCP - General (Internal Medicine) Laurence Spates, MD as Consulting Physician (Gastroenterology) Leonie Man, MD as Consulting Physician (Cardiology) Erline Levine, MD as Consulting Physician (Neurosurgery) Carolan Clines, MD as Consulting Physician (Urology)  Allergies Allergies  Allergen Reactions  . Atorvastatin Other (See Comments)    MYALGIAS CRAMPING  . Crestor [Rosuvastatin]     MYALGIAS CRAMPING  . Statins     MYALGIAS CRAMPING  . Tetanus Toxoids     UNSPECIFIED REACTION     SURGICAL HISTORY He  has a past surgical history that includes Cardiac catheterization (12/2009); Coronary angioplasty (12/2009); Cardiac catheterization (February 2012 ); Vasectomy; transthoracic echocardiogram (June 2011); Cataract extraction w/ intraocular lens  implant, bilateral; Dental surgery; Lumbar laminectomy/decompression microdiscectomy (N/A, 08/09/2015); Nasal fracture surgery; Lumbar laminectomy/decompression microdiscectomy (Right, 01/21/2016); and Lumbar laminectomy/decompression microdiscectomy (Right, 10/08/2017). FAMILY HISTORY His family history includes Cancer  in his brother, father, sister, and son; Stroke in his father. SOCIAL HISTORY He  reports that he has been smoking pipe.  He has smoked for the past 60.00 years. He has never used smokeless tobacco. He reports that he drinks about 4.2 oz of alcohol per week. He reports that he does not use drugs.  MEDICARE WELLNESS OBJECTIVES: Tobacco use: He does smoke.  counseling given, no intention of quitting Alcohol Current alcohol use: 4 oz of  vodka Diet: well balanced Physical activity: Current Exercise Habits: The patient does not participate in regular exercise at present, Exercise limited by: neurologic condition(s) Cardiac risk factors: Cardiac Risk Factors include: advanced age (>27men, >16 women);male gender;dyslipidemia;hypertension;sedentary lifestyle;smoking/ tobacco exposure;diabetes mellitus Depression/mood screen:   Depression screen [facility] 2/9 02/03/2018  Decreased Interest 0  Down, Depressed, Hopeless 0  PHQ - 2 Score 0    ADLs:  In your present state of health, do you have any difficulty performing the following activities: 02/03/2018 10/22/2017  Hearing? N Y  Comment Wears hearing aids and does well has hearing aids  Vision? N N  Difficulty concentrating or making decisions? Y N  Comment some name recall declines -  Walking or climbing stairs? N N  Comment slow due to RLE previous numbness -  Dressing or bathing? N N  Doing errands, shopping? N N  Some recent data might be hidden     Cognitive Testing  Alert? Yes  Normal Appearance?Yes  Oriented to person? Yes  Place? Yes   Time? Yes  Recall of three objects?  Yes  Can perform simple calculations? Yes  Displays appropriate judgment?Yes  Can read the correct time from a watch face?Yes  EOL planning: Does Patient Have a Medical Advance Directive?: No, Yes Type of Advance Directive: Healthcare Power of Attorney, Living will Does patient want to make changes to medical advance directive?: No - Patient declined Copy of [place] in Chart?: No - copy requested Would patient like information on creating a medical advance directive?: No - Patient declined   Objective:   Today's Vitals   02/03/18 0957  BP: 124/74  Pulse: (!) 43  Temp: (!) 97.2 F (36.2 C)  SpO2: 97%  Weight: 196 lb (88.9 kg)  Height: 5' 10.5" (1.791 m)   Body mass index is 27.73 kg/m.  General appearance: alert, no distress, WD/WN, male HEENT: normocephalic,  sclerae anicteric, TMs pearly, nares patent, no discharge or erythema, pharynx normal Oral cavity: MMM, no lesions Neck: supple, no lymphadenopathy, no thyromegaly, no masses Heart: RRR, normal S1, S2, no murmurs Lungs: CTA bilaterally, no wheezes, rhonchi, or rales Abdomen: +bs, soft, non tender, non distended, no masses, no hepatomegaly, no splenomegaly Musculoskeletal: nontender, no swelling, no obvious deformity Extremities: no edema, no cyanosis, no clubbing Pulses: 2+ symmetric, upper and lower extremities, normal cap refill Neurological: alert, oriented x 3, CN2-12 intact, strength normal upper extremities and lower extremities, sensation normal throughout, DTRs 2+ throughout, no cerebellar signs, gait slow but steady Psychiatric: normal affect, behavior normal, pleasant   Medicare Attestation I have personally reviewed: The patient's medical and social history Their use of alcohol, tobacco or illicit drugs Their current medications and supplements The patient's functional ability including ADLs,fall risks, home safety risks, cognitive, and hearing and visual impairment Diet and physical activities Evidence for depression or mood disorders  The patient's weight, height, BMI, and visual acuity have been recorded in the chart.  I have made referrals, counseling, and provided education to the patient based  on review of the above and I have provided the patient with a written personalized care plan for preventive services.     Izora Ribas, NP   02/03/2018

## 2018-02-03 ENCOUNTER — Ambulatory Visit (INDEPENDENT_AMBULATORY_CARE_PROVIDER_SITE_OTHER): Payer: Medicare Other | Admitting: Adult Health

## 2018-02-03 ENCOUNTER — Encounter: Payer: Self-pay | Admitting: Adult Health

## 2018-02-03 VITALS — BP 124/74 | HR 43 | Temp 97.2°F | Ht 70.5 in | Wt 196.0 lb

## 2018-02-03 DIAGNOSIS — Z9861 Coronary angioplasty status: Secondary | ICD-10-CM

## 2018-02-03 DIAGNOSIS — I700 Atherosclerosis of aorta: Secondary | ICD-10-CM

## 2018-02-03 DIAGNOSIS — N401 Enlarged prostate with lower urinary tract symptoms: Secondary | ICD-10-CM

## 2018-02-03 DIAGNOSIS — E663 Overweight: Secondary | ICD-10-CM | POA: Diagnosis not present

## 2018-02-03 DIAGNOSIS — E559 Vitamin D deficiency, unspecified: Secondary | ICD-10-CM | POA: Diagnosis not present

## 2018-02-03 DIAGNOSIS — R6889 Other general symptoms and signs: Secondary | ICD-10-CM | POA: Diagnosis not present

## 2018-02-03 DIAGNOSIS — I2510 Atherosclerotic heart disease of native coronary artery without angina pectoris: Secondary | ICD-10-CM | POA: Diagnosis not present

## 2018-02-03 DIAGNOSIS — Z0000 Encounter for general adult medical examination without abnormal findings: Secondary | ICD-10-CM

## 2018-02-03 DIAGNOSIS — E11319 Type 2 diabetes mellitus with unspecified diabetic retinopathy without macular edema: Secondary | ICD-10-CM

## 2018-02-03 DIAGNOSIS — Z0001 Encounter for general adult medical examination with abnormal findings: Secondary | ICD-10-CM

## 2018-02-03 DIAGNOSIS — M7138 Other bursal cyst, other site: Secondary | ICD-10-CM | POA: Diagnosis not present

## 2018-02-03 DIAGNOSIS — E785 Hyperlipidemia, unspecified: Secondary | ICD-10-CM | POA: Diagnosis not present

## 2018-02-03 DIAGNOSIS — R350 Frequency of micturition: Secondary | ICD-10-CM

## 2018-02-03 DIAGNOSIS — I10 Essential (primary) hypertension: Secondary | ICD-10-CM

## 2018-02-03 DIAGNOSIS — Z79899 Other long term (current) drug therapy: Secondary | ICD-10-CM | POA: Diagnosis not present

## 2018-02-03 DIAGNOSIS — E1121 Type 2 diabetes mellitus with diabetic nephropathy: Secondary | ICD-10-CM | POA: Diagnosis not present

## 2018-02-03 DIAGNOSIS — F17200 Nicotine dependence, unspecified, uncomplicated: Secondary | ICD-10-CM | POA: Diagnosis not present

## 2018-02-03 NOTE — Patient Instructions (Signed)
Aim for 7+ servings of fruits and vegetables daily  80+ fluid ounces of water or unsweet tea for healthy kidneys  Limit alcohol, avoid tobacco  Limit animal fats in diet for cholesterol and heart health - choose grass fed whenever available  Aim for low stress - take time to unwind and care for your mental health  Aim for 150 min of moderate intensity exercise weekly for heart health, and weights twice weekly for bone health  Aim for 7-9 hours of sleep daily     When it comes to diets, agreement about the perfect plan isn't easy to find, even among the experts. Experts at the Agency developed an idea known as the Healthy Eating Plate. Just imagine a plate divided into logical, healthy portions.  The emphasis is on diet quality:  Load up on vegetables and fruits - one-half of your plate: Aim for color and variety, and remember that potatoes don't count.  Go for whole grains - one-quarter of your plate: Whole wheat, barley, wheat berries, quinoa, oats, brown rice, and foods made with them. If you want pasta, go with whole wheat pasta.  Protein power - one-quarter of your plate: Fish, chicken, beans, and nuts are all healthy, versatile protein sources. Limit red meat.  The diet, however, does go beyond the plate, offering a few other suggestions.  Use healthy plant oils, such as olive, canola, soy, corn, sunflower and peanut. Check the labels, and avoid partially hydrogenated oil, which have unhealthy trans fats.  If you're thirsty, drink water. Coffee and tea are good in moderation, but skip sugary drinks and limit milk and dairy products to one or two daily servings.  The type of carbohydrate in the diet is more important than the amount. Some sources of carbohydrates, such as vegetables, fruits, whole grains, and beans-are healthier than others.  Finally, stay active.

## 2018-02-04 LAB — COMPLETE METABOLIC PANEL WITH GFR
AG Ratio: 1.7 (calc) (ref 1.0–2.5)
ALBUMIN MSPROF: 4.3 g/dL (ref 3.6–5.1)
ALT: 14 U/L (ref 9–46)
AST: 16 U/L (ref 10–35)
Alkaline phosphatase (APISO): 59 U/L (ref 40–115)
BILIRUBIN TOTAL: 0.6 mg/dL (ref 0.2–1.2)
BUN: 13 mg/dL (ref 7–25)
CALCIUM: 9.8 mg/dL (ref 8.6–10.3)
CHLORIDE: 100 mmol/L (ref 98–110)
CO2: 29 mmol/L (ref 20–32)
CREATININE: 0.91 mg/dL (ref 0.70–1.18)
GFR, EST AFRICAN AMERICAN: 93 mL/min/{1.73_m2} (ref >=60)
GFR, EST NON AFRICAN AMERICAN: 80 mL/min/{1.73_m2} (ref >=60)
GLUCOSE: 105 mg/dL — AB (ref 65–99)
Globulin: 2.5 g/dL (calc) (ref 1.9–3.7)
Potassium: 4.3 mmol/L (ref 3.5–5.3)
Sodium: 135 mmol/L (ref 135–146)
TOTAL PROTEIN: 6.8 g/dL (ref 6.1–8.1)

## 2018-02-04 LAB — CBC WITH DIFFERENTIAL/PLATELET
BASOS ABS: 62 {cells}/uL (ref 0–200)
Basophils Relative: 0.8 %
EOS PCT: 4 %
Eosinophils Absolute: 308 cells/uL (ref 15–500)
HCT: 46.5 % (ref 38.5–50.0)
HEMOGLOBIN: 15.8 g/dL (ref 13.2–17.1)
Lymphs Abs: 1455 cells/uL (ref 850–3900)
MCH: 31.5 pg (ref 27.0–33.0)
MCHC: 34 g/dL (ref 32.0–36.0)
MCV: 92.8 fL (ref 80.0–100.0)
MONOS PCT: 11.1 %
MPV: 10.6 fL (ref 7.5–12.5)
NEUTROS ABS: 5020 {cells}/uL (ref 1500–7800)
Neutrophils Relative %: 65.2 %
PLATELETS: 232 10*3/uL (ref 140–400)
RBC: 5.01 10*6/uL (ref 4.20–5.80)
RDW: 12.4 % (ref 11.0–15.0)
TOTAL LYMPHOCYTE: 18.9 %
WBC mixed population: 855 cells/uL (ref 200–950)
WBC: 7.7 10*3/uL (ref 3.8–10.8)

## 2018-02-04 LAB — LIPID PANEL
Cholesterol: 176 mg/dL (ref <200)
HDL: 53 mg/dL (ref >40)
LDL CHOLESTEROL (CALC): 99 mg/dL
NON-HDL CHOLESTEROL (CALC): 123 mg/dL (ref <130)
Total CHOL/HDL Ratio: 3.3 (calc) (ref <5.0)
Triglycerides: 139 mg/dL (ref <150)

## 2018-02-04 LAB — HEMOGLOBIN A1C
EAG (MMOL/L): 6.6 (calc)
Hgb A1c MFr Bld: 5.8 % of total Hgb — ABNORMAL HIGH (ref <5.7)
Mean Plasma Glucose: 120 (calc)

## 2018-02-04 LAB — MAGNESIUM: Magnesium: 2 mg/dL (ref 1.5–2.5)

## 2018-02-04 LAB — TSH: TSH: 2.06 mIU/L (ref 0.40–4.50)

## 2018-03-03 ENCOUNTER — Other Ambulatory Visit: Payer: Self-pay | Admitting: Adult Health

## 2018-03-23 ENCOUNTER — Encounter: Payer: Self-pay | Admitting: Cardiology

## 2018-03-23 ENCOUNTER — Ambulatory Visit (INDEPENDENT_AMBULATORY_CARE_PROVIDER_SITE_OTHER): Payer: Medicare Other | Admitting: Cardiology

## 2018-03-23 VITALS — BP 142/68 | HR 53 | Ht 70.5 in | Wt 197.0 lb

## 2018-03-23 DIAGNOSIS — Z9861 Coronary angioplasty status: Secondary | ICD-10-CM

## 2018-03-23 DIAGNOSIS — F17200 Nicotine dependence, unspecified, uncomplicated: Secondary | ICD-10-CM

## 2018-03-23 DIAGNOSIS — E785 Hyperlipidemia, unspecified: Secondary | ICD-10-CM

## 2018-03-23 DIAGNOSIS — I2510 Atherosclerotic heart disease of native coronary artery without angina pectoris: Secondary | ICD-10-CM

## 2018-03-23 DIAGNOSIS — I10 Essential (primary) hypertension: Secondary | ICD-10-CM | POA: Diagnosis not present

## 2018-03-23 NOTE — Patient Instructions (Signed)
NO CHANGE WITH MEDICATIONS    Your physician wants you to follow-up in [place]. You will receive a reminder letter in the mail two months in advance. If you don't receive a letter, please call our office to schedule the follow-up appointment.   If you need a refill on your cardiac medications before your next appointment, please call your pharmacy.

## 2018-03-23 NOTE — Progress Notes (Signed)
PCP: [name], MD  Clinic Note: Chief Complaint  Patient presents with  . Follow-up    ~ 6 month  . Coronary Artery Disease    no Angina    HPI: David [name] is a 82 y.o. male with a PMH below who presents today for ~6 month f/u. For CAD-PCI with CRFs. He is a former patient Dr. Aldona Bar. 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  NSTEMI in June of 2011with PCI to the LAD & moderate residual CAD.  Relook catheterization in February 2012 identified a moderate lesion proximal to the LAD stent. FFR 0.82 --> medical management and he has done well since.  David [name] was last seen on Sep 15, 2017 for Pre-op evaluation for synovial cyst removal from back (L3-L4) - otherwise stable from a CV standpoint.  Recent Hospitalizations:   10/08/2017 - admitted for Lumbar spine Sgx.  Studies Personally Reviewed - (if available, images/films reviewed: From Epic Chart or Care Everywhere)  none  Interval History: David [name] returns today still doing well with no real cardiac complaints.  He feels better post-op - just not back as far as he would like.  He is able to walk more & getting more exercise.    No chest pain or shortness of breath with rest or exertion. No PND, orthopnea or edema. No palpitations, lightheadedness, dizziness, weakness or syncope/near syncope. No TIA/amaurosis fugax symptoms. No melena, hematochezia, hematuria, or epstaxis. No claudication.  ROS: A comprehensive was performed. Review of Systems  Constitutional: Negative for malaise/fatigue.  Respiratory: Positive for cough (mild AM cough). Negative for shortness of breath.   Gastrointestinal: Negative for blood in stool and melena.  Genitourinary: Negative for hematuria.  Musculoskeletal: Positive for back pain (pain almost gone along with radicular pain).  Neurological: Positive for weakness (R leg still weak).       R leg radicular pain is better, but still has not regained strength yet.  But still happy.  Psychiatric/Behavioral: The patient is not nervous/anxious.   All other systems reviewed and are negative.  I have reviewed and (if needed) personally updated the patient's problem list, medications, allergies, past medical and surgical history, social and family history.   Past Medical History:  Diagnosis Date  . Arthritis   . BPH (benign prostatic hyperplasia)   . CAD S/P percutaneous coronary angioplasty 12/2009; 08/2010   a) 6/'22 - NSTEMI: PCI to LAD: Promus Element 2.5 mm x 15 mm DES; b) Cath for Angina: Prox LAD 60-70% pre-stent with FFR 0.82, 90% RVM -- Med Rx, EF 50-55%  . Essential hypertension   . Hyperlipidemia with target LDL less than 70    Statin intolerance  . Non-ST elevation MI (NSTEMI) [facility]) June 2011   PCI to LAD  . Pneumonia    DEC 2016  TX WITH ANTIBIOTIC  . Type 2 diabetes mellitus without complications [facility])     Past Surgical History:  Procedure Laterality Date  . CARDIAC CATHETERIZATION  12/2009   Proximal LAD stenosis followed by a significant 80-90% distal stenosis  . CARDIAC CATHETERIZATION  February 2012    90% ostial RV marginal branch; 60-70% proximal LAD with widely patent distal stent. FFR 0.82; medical therapy  . CATARACT EXTRACTION W/ INTRAOCULAR LENS  IMPLANT, BILATERAL    . CORONARY ANGIOPLASTY  12/2009   PTCA to proximal LAD; PCI with Promus Element DES stent  2.5 mm x 15 mm  distalLAD - .for non-ST elevation MI  . DENTAL SURGERY    .  LUMBAR LAMINECTOMY/DECOMPRESSION MICRODISCECTOMY N/A 08/09/2015   Procedure: Lumbar Four-Five, Lumbar Five- Sacral One Decompressive Lumbar Laminectomy with Resection of Synovial Cyst;  Surgeon: Erline Levine, MD;  Location: [place] NEURO ORS;  Service: Neurosurgery;  Laterality: N/A;  L4 to S1 Decompressive Lumbar Laminectomy  . LUMBAR LAMINECTOMY/DECOMPRESSION MICRODISCECTOMY Right 01/21/2016   Procedure: Redo Right Lumbar Five-Sacral One Laminectomy for synovial cyst;  Surgeon: Erline Levine, MD;  Location:  [place] NEURO ORS;  Service: Neurosurgery;  Laterality: Right;  right  . LUMBAR LAMINECTOMY/DECOMPRESSION MICRODISCECTOMY Right 10/08/2017   Procedure: Right Lumbar three-four Laminectomy for synovial cyst;  Surgeon: Erline Levine, MD;  Location: [place];  Service: Neurosurgery;  Laterality: Right;  . NASAL FRACTURE SURGERY    . TRANSTHORACIC ECHOCARDIOGRAM  June 2011   - (EF not reported) Moderately dilated LV; moderate hypokinesis of anteroseptal and anterior wall consistent with MI. Grade 1 diastolic dysfunction. Mild to moderately dilated LA  . VASECTOMY      Current Meds  Medication Sig  . aspirin EC 81 MG tablet Take 1 tablet (81 mg total) by mouth every other day.  . Cholecalciferol (VITAMIN D) 2000 units CAPS Take 2 capsules by mouth.  Marland Kitchen CINNAMON PO Take 1,000 mg by mouth daily.   . clopidogrel (PLAVIX) 75 MG tablet Take 75 mg by mouth daily.  Marland Kitchen doxazosin (CARDURA) 4 MG tablet TAKE 1 TABLET BY MOUTH EVERYDAY AT BEDTIME  . ezetimibe (ZETIA) 10 MG tablet TAKE 1 TABLET BY MOUTH EVERY DAY  . finasteride (PROSCAR) 5 MG tablet Take 5 mg by mouth daily.  Marland Kitchen lisinopril (PRINIVIL,ZESTRIL) 5 MG tablet TAKE 1 TABLET BY MOUTH DAILY.  . Magnesium 500 MG CAPS Take 1 capsule by mouth daily.  . metFORMIN (GLUCOPHAGE-XR) 500 MG 24 hr tablet TAKE 2 TABLETS BY MOUTH TWICE A DAY    Allergies  Allergen Reactions  . Atorvastatin Other (See Comments)    MYALGIAS CRAMPING  . Crestor [Rosuvastatin]     MYALGIAS CRAMPING  . Statins     MYALGIAS CRAMPING  . Tetanus Toxoids     UNSPECIFIED REACTION     Social History   Tobacco Use  . Smoking status: Current Every Day Smoker    Years: 60.00    Types: Pipe  . Smokeless tobacco: Never Used  . Tobacco comment: smokes pipe qd  Substance Use Topics  . Alcohol use: Yes    Alcohol/week: 7.0 standard drinks    Types: 7 Standard drinks or equivalent per week    Comment: drinks 4 ounces of vodka daily  . Drug use: No   Social History   Social History  Narrative   He is a married father of 2 with no grandchildren as of yet. He continued to smoke a pipe several times the course of day. He does have an occasional alcoholic beverage. While not involved a standard exercise routine, he is very active with walking and working in the yard, splitting wood and doing aggressive yard work including Production designer, theatre/television/film.  -- NO interest in smoking cessation - enjoys smoking his pipe off & on throughout the day.  family history includes Cancer in his brother, father, sister, and son; Stroke in his father.  Wt Readings from Last 3 Encounters:  03/23/18 197 lb (89.4 kg)  02/03/18 196 lb (88.9 kg)  10/22/17 189 lb 6.4 oz (85.9 kg)    PHYSICAL EXAM BP (!) 142/68   Pulse (!) 53   Ht 5' 10.5" (1.791 m)   Wt 197  lb (89.4 kg)   BMI 27.87 kg/m  Physical Exam  Constitutional: He is oriented to person, place, and time.  HENT:  Head: Normocephalic and atraumatic.  Neck: Normal range of motion. Neck supple. No hepatojugular reflux and no JVD present. Carotid bruit is not present.  Cardiovascular: Regular rhythm, normal heart sounds, intact distal pulses and normal pulses.  Occasional extrasystoles are present. Bradycardia present. PMI is not displaced. Exam reveals no gallop and no friction rub.  No murmur heard. Pulmonary/Chest: Effort normal and breath sounds normal. No respiratory distress. He has no wheezes. He has no rales.  Abdominal: Soft. Bowel sounds are normal. He exhibits no distension. There is no tenderness. There is no rebound.  No HSM  Musculoskeletal: He exhibits no edema.  More fluid gait  Neurological: He is alert and oriented to person, place, and time.  Psychiatric: He has a normal mood and affect. His behavior is normal. Judgment and thought content normal.  Vitals reviewed.    Adult ECG Report N/a  Other studies Reviewed: Additional studies/ records that were reviewed today include:  Recent Labs:   Lab Results    Component Value Date   CHOL 176 02/03/2018   HDL 53 02/03/2018   LDLCALC 99 02/03/2018   TRIG 139 02/03/2018   CHOLHDL 3.3 02/03/2018   Lab Results  Component Value Date   CREATININE 0.91 02/03/2018   BUN 13 02/03/2018   NA 135 02/03/2018   K 4.3 02/03/2018   CL 100 02/03/2018   CO2 29 02/03/2018    ASSESSMENT / PLAN: Problem List Items Addressed This Visit    CAD S/P percutaneous coronary angioplasty and PCI- DES mid LAD in the setting of non-STEMI - Primary (Chronic)    S/p PCI with borderline LAD lesion (grey zone FFR) - treated medically with no angina > 7 yrs post cath.   With the level of activity he is capable of - no angina. On ASA & Plavix -> would be OK to stop ASA. No Beta Blocker due to h/o bradycardia & fatigue issues. On ACE-I. No statin - intolerant with no intention of taking anything other than Zetia.      Relevant Orders   EKG 12-Lead (Completed)   Essential hypertension (Chronic)    BP better today - still borderline. He says BPs are usually better -> no change to medications      Relevant Orders   EKG 12-Lead (Completed)   Hyperlipidemia with statin intolerance; goal LDL < 70 (Chronic)    He was not interested in considering fenofibrate last visit - tolerating Zetia.   Declined PCSK-9 inbitor.  At least his LDL is < 100 with Zetia & diet.  He acknowledges the risk of not managing further, & is willing to take the rest.      Tobacco dependency (Chronic)    Still smoking his pipe --> Not interested in quitting        I spent a total of 25 minutes with the patient and chart review. >  50% of the time was spent in direct patient consultation.   Current medicines are reviewed at length with the patient today.  (+/- concerns) n/a The following changes have been made:  none  Patient Instructions  NO CHANGE WITH MEDICATIONS    Your physician wants you to follow-up in [place]. You will receive a reminder letter in the mail two  months in advance. If you don't receive a letter, please call our office to  schedule the follow-up appointment.   If you need a refill on your cardiac medications before your next appointment, please call your pharmacy.     Studies Ordered:   Orders Placed This Encounter  Procedures  . EKG 12-Lead      Glenetta Hew, M.D., M.S. Interventional Cardiologist   Pager # [phone] Phone # [phone] [address] [place], [place] [address]   Thank you for choosing [facility] at [facility]!!

## 2018-03-24 ENCOUNTER — Encounter: Payer: Self-pay | Admitting: Cardiology

## 2018-03-24 NOTE — Assessment & Plan Note (Signed)
S/p PCI with borderline LAD lesion (grey zone FFR) - treated medically with no angina > 7 yrs post cath.   With the level of activity he is capable of - no angina. On ASA & Plavix -> would be OK to stop ASA. No Beta Blocker due to h/o bradycardia & fatigue issues. On ACE-I. No statin - intolerant with no intention of taking anything other than Zetia.

## 2018-03-24 NOTE — Assessment & Plan Note (Addendum)
He was not interested in considering fenofibrate last visit - tolerating Zetia.   Declined PCSK-9 inbitor.  At least his LDL is < 100 with Zetia & diet.  He acknowledges the risk of not managing further, & is willing to take the rest.

## 2018-03-24 NOTE — Assessment & Plan Note (Signed)
Still smoking his pipe --> Not interested in quitting

## 2018-03-24 NOTE — Assessment & Plan Note (Signed)
BP better today - still borderline. He says BPs are usually better -> no change to medications

## 2018-04-05 ENCOUNTER — Other Ambulatory Visit: Payer: Self-pay | Admitting: Cardiology

## 2018-05-01 ENCOUNTER — Other Ambulatory Visit: Payer: Self-pay | Admitting: Adult Health

## 2018-05-06 DIAGNOSIS — R3915 Urgency of urination: Secondary | ICD-10-CM | POA: Diagnosis not present

## 2018-05-06 DIAGNOSIS — N5201 Erectile dysfunction due to arterial insufficiency: Secondary | ICD-10-CM | POA: Diagnosis not present

## 2018-05-06 DIAGNOSIS — N401 Enlarged prostate with lower urinary tract symptoms: Secondary | ICD-10-CM | POA: Diagnosis not present

## 2018-05-13 ENCOUNTER — Ambulatory Visit (INDEPENDENT_AMBULATORY_CARE_PROVIDER_SITE_OTHER): Payer: Medicare Other | Admitting: Adult Health Nurse Practitioner

## 2018-05-13 ENCOUNTER — Encounter: Payer: Self-pay | Admitting: Adult Health Nurse Practitioner

## 2018-05-13 VITALS — BP 114/68 | HR 53 | Temp 98.0°F | Resp 14 | Ht 70.5 in | Wt 195.6 lb

## 2018-05-13 DIAGNOSIS — R350 Frequency of micturition: Secondary | ICD-10-CM | POA: Diagnosis not present

## 2018-05-13 DIAGNOSIS — E11319 Type 2 diabetes mellitus with unspecified diabetic retinopathy without macular edema: Secondary | ICD-10-CM

## 2018-05-13 DIAGNOSIS — I10 Essential (primary) hypertension: Secondary | ICD-10-CM | POA: Diagnosis not present

## 2018-05-13 DIAGNOSIS — Z9861 Coronary angioplasty status: Secondary | ICD-10-CM | POA: Diagnosis not present

## 2018-05-13 DIAGNOSIS — F17200 Nicotine dependence, unspecified, uncomplicated: Secondary | ICD-10-CM | POA: Diagnosis not present

## 2018-05-13 DIAGNOSIS — Z23 Encounter for immunization: Secondary | ICD-10-CM | POA: Diagnosis not present

## 2018-05-13 DIAGNOSIS — I700 Atherosclerosis of aorta: Secondary | ICD-10-CM

## 2018-05-13 DIAGNOSIS — E785 Hyperlipidemia, unspecified: Secondary | ICD-10-CM

## 2018-05-13 DIAGNOSIS — E663 Overweight: Secondary | ICD-10-CM

## 2018-05-13 DIAGNOSIS — E1121 Type 2 diabetes mellitus with diabetic nephropathy: Secondary | ICD-10-CM

## 2018-05-13 DIAGNOSIS — Z79899 Other long term (current) drug therapy: Secondary | ICD-10-CM

## 2018-05-13 DIAGNOSIS — M7138 Other bursal cyst, other site: Secondary | ICD-10-CM

## 2018-05-13 DIAGNOSIS — I2510 Atherosclerotic heart disease of native coronary artery without angina pectoris: Secondary | ICD-10-CM | POA: Diagnosis not present

## 2018-05-13 DIAGNOSIS — E559 Vitamin D deficiency, unspecified: Secondary | ICD-10-CM | POA: Diagnosis not present

## 2018-05-13 DIAGNOSIS — N401 Enlarged prostate with lower urinary tract symptoms: Secondary | ICD-10-CM | POA: Diagnosis not present

## 2018-05-13 NOTE — Patient Instructions (Addendum)
Diabetes Mellitus and Nutrition When you have diabetes (diabetes mellitus), it is very important to have healthy eating habits because your blood sugar (glucose) levels are greatly affected by what you eat and drink. Eating healthy foods in the appropriate amounts, at about the same times every day, can help you:  Control your blood glucose.  Lower your risk of heart disease.  Improve your blood pressure.  Reach or maintain a healthy weight.  Every person with diabetes is different, and each person has different needs for a meal plan. Your health care provider may recommend that you work with a diet and nutrition specialist (dietitian) to make a meal plan that is best for you. Your meal plan may vary depending on factors such as:  The calories you need.  The medicines you take.  Your weight.  Your blood glucose, blood pressure, and cholesterol levels.  Your activity level.  Other health conditions you have, such as heart or kidney disease.  How do carbohydrates affect me? Carbohydrates affect your blood glucose level more than any other type of food. Eating carbohydrates naturally increases the amount of glucose in your blood. Carbohydrate counting is a method for keeping track of how many carbohydrates you eat. Counting carbohydrates is important to keep your blood glucose at a healthy level, especially if you use insulin or take certain oral diabetes medicines. It is important to know how many carbohydrates you can safely have in each meal. This is different for every person. Your dietitian can help you calculate how many carbohydrates you should have at each meal and for snack. Foods that contain carbohydrates include:  Bread, cereal, rice, pasta, and crackers.  Potatoes and corn.  Peas, beans, and lentils.  Milk and yogurt.  Fruit and juice.  Desserts, such as cakes, cookies, ice cream, and candy.  How does alcohol affect me? Alcohol can cause a sudden decrease in blood  glucose (hypoglycemia), especially if you use insulin or take certain oral diabetes medicines. Hypoglycemia can be a life-threatening condition. Symptoms of hypoglycemia (sleepiness, dizziness, and confusion) are similar to symptoms of having too much alcohol. If your health care provider says that alcohol is safe for you, follow these guidelines:  Limit alcohol intake to no more than 1 drink per day for nonpregnant women and 2 drinks per day for men. One drink equals 12 oz of beer, 5 oz of wine, or 1 oz of hard liquor.  Do not drink on an empty stomach.  Keep yourself hydrated with water, diet soda, or unsweetened iced tea.  Keep in mind that regular soda, juice, and other mixers may contain a lot of sugar and must be counted as carbohydrates.  What are tips for following this plan? Reading food labels  Start by checking the serving size on the label. The amount of calories, carbohydrates, fats, and other nutrients listed on the label are based on one serving of the food. Many foods contain more than one serving per package.  Check the total grams (g) of carbohydrates in one serving. You can calculate the number of servings of carbohydrates in one serving by dividing the total carbohydrates by 15. For example, if a food has 30 g of total carbohydrates, it would be equal to 2 servings of carbohydrates.  Check the number of grams (g) of saturated and trans fats in one serving. Choose foods that have low or no amount of these fats.  Check the number of milligrams (mg) of sodium in one serving. Most people   should limit total sodium intake to less than 2,300 mg per day.  Always check the nutrition information of foods labeled as "low-fat" or "nonfat". These foods may be higher in added sugar or refined carbohydrates and should be avoided.  Talk to your dietitian to identify your daily goals for nutrients listed on the label. Shopping  Avoid buying canned, premade, or processed foods. These  foods tend to be high in fat, sodium, and added sugar.  Shop around the outside edge of the grocery store. This includes fresh fruits and vegetables, bulk grains, fresh meats, and fresh dairy. Cooking  Use low-heat cooking methods, such as baking, instead of high-heat cooking methods like deep frying.  Cook using healthy oils, such as olive, canola, or sunflower oil.  Avoid cooking with butter, cream, or high-fat meats. Meal planning  Eat meals and snacks regularly, preferably at the same times every day. Avoid going long periods of time without eating.  Eat foods high in fiber, such as fresh fruits, vegetables, beans, and whole grains. Talk to your dietitian about how many servings of carbohydrates you can eat at each meal.  Eat 4-6 ounces of lean protein each day, such as lean meat, chicken, fish, eggs, or tofu. 1 ounce is equal to 1 ounce of meat, chicken, or fish, 1 egg, or 1/4 cup of tofu.  Eat some foods each day that contain healthy fats, such as avocado, nuts, seeds, and fish. Lifestyle   Check your blood glucose regularly.  Exercise at least 30 minutes 5 or more days each week, or as told by your health care provider.  Take medicines as told by your health care provider.  Do not use any products that contain nicotine or tobacco, such as cigarettes and e-cigarettes. If you need help quitting, ask your health care provider.  Work with a Social worker or diabetes educator to identify strategies to manage stress and any emotional and social challenges. What are some questions to ask my health care provider?  Do I need to meet with a diabetes educator?  Do I need to meet with a dietitian?  What number can I call if I have questions?  When are the best times to check my blood glucose? Where to find more information:  American Diabetes Association: diabetes.org/food-and-fitness/food  Academy of Nutrition and Dietetics:  PokerClues.dk  [employer] of Diabetes and Digestive and Kidney Diseases (NIH): ContactWire.be Summary  A healthy meal plan will help you control your blood glucose and maintain a healthy lifestyle.  Working with a diet and nutrition specialist (dietitian) can help you make a meal plan that is best for you.  Keep in mind that carbohydrates and alcohol have immediate effects on your blood glucose levels. It is important to count carbohydrates and to use alcohol carefully. This information is not intended to replace advice given to you by your health care provider. Make sure you discuss any questions you have with your health care provider. Document Released: 04/03/2005 Document Revised: 08/11/2016 Document Reviewed: 08/11/2016 Elsevier Interactive Patient Education  2018 [employer].   Tobacco Use Disorder Tobacco use disorder (TUD) is a mental disorder. It is the long-term use of tobacco in spite of related health problems or difficulty with normal life activities. Tobacco is most commonly smoked as cigarettes and less commonly as cigars or pipes. Smokeless chewing tobacco and snuff are also popular. People with TUD get a feeling of extreme pleasure (euphoria) from using tobacco and have a desire to use it again and again.  Repeated use of tobacco can cause problems. The addictive effects of tobacco are due mainly tothe ingredient nicotine. Nicotine also causes a rush of adrenaline (epinephrine) in the body. This leads to increased blood pressure, heart rate, and breathing rate. These changes may cause problems for people with high blood pressure, weak hearts, or lung disease. High doses of nicotine in children and pets can lead to seizures and death. Tobacco contains a number of other unsafe chemicals. These chemicals are especially harmful when inhaled as smoke and  can damage almost every organ in the body. Smokers live shorter lives than nonsmokers and are at risk of dying from a number of diseases and cancers. Tobacco smoke can also cause health problems for nonsmokers (due to inhaling secondhand smoke). Smoking is also a fire hazard. TUD usually starts in the late teenage years and is most common in young adults between the ages of 57 and 49 years. People who start smoking earlier in life are more likely to continue smoking as adults. TUD is somewhat more common in men than women. People with TUD are at higher risk for using alcohol and other drugs of abuse. What increases the risk? Risk factors for TUD include:  Having family members with the disorder.  Being around people who use tobacco.  Having an existing mental health issue such as schizophrenia, depression, bipolar disorder, ADHD, or posttraumatic stress disorder (PTSD).  What are the signs or symptoms? People with tobacco use disorder have two or more of the following signs and symptoms within 12 months:  Use of more tobacco over a longer period than intended.  Not able to cut down or control tobacco use.  A lot of time spent obtaining or using tobacco.  Strong desire or urge to use tobacco (craving). Cravings may last for 6 months or longer after quitting.  Use of tobacco even when use leads to major problems at work, school, or home.  Use of tobacco even when use leads to relationship problems.  Giving up or cutting down on important life activities because of tobacco use.  Repeatedly using tobacco in situations where it puts you or others in physical danger, like smoking in bed.  Use of tobacco even when it is known that a physical or mental problem is likely related to tobacco use. ? Physical problems are numerous and may include chronic bronchitis, emphysema, lung and other cancers, gum disease, high blood pressure, heart disease, and stroke. ? Mental problems caused by tobacco  may include difficulty sleeping and anxiety.  Need to use greater amounts of tobacco to get the same effect. This means you have developed a tolerance.  Withdrawal symptoms as a result of stopping or rapidly cutting back use. These symptoms may last a month or more after quitting and include the following: ? Depressed, anxious, or irritable mood. ? Difficulty concentrating. ? Increased appetite. ? Restlessness or trouble sleeping. ? Use of tobacco to avoid withdrawal symptoms.  How is this diagnosed? Tobacco use disorder is diagnosed by your health care provider. A diagnosis may be made by:  Your health care provider asking questions about your tobacco use and any problems it may be causing.  A physical exam.  Lab tests.  You may be referred to a mental health professional or addiction specialist.  The severity of tobacco use disorder depends on the number of signs and symptoms you have:  Mild-Two or three symptoms.  Moderate-Four or five symptoms.  Severe-Six or more symptoms.  How is this  treated? Many people with tobacco use disorder are unable to quit on their own and need help. Treatment options include the following:  Nicotine replacement therapy (NRT). NRT provides nicotine without the other harmful chemicals in tobacco. NRT gradually lowers the dosage of nicotine in the body and reduces withdrawal symptoms. NRT is available in over-the-counter forms (gum, lozenges, and skin patches) as well as prescription forms (mouth inhaler and nasal spray).  Medicines.This may include: ? Antidepressant medicine that may reduce nicotine cravings. ? A medicine that acts on nicotine receptors in the brain to reduce cravings and withdrawal symptoms. It may also block the effects of tobacco in people with TUD who relapse.  Counseling or talk therapy. A form of talk therapy called behavioral therapy is commonly used to treat people with TUD. Behavioral therapy looks at triggers for  tobacco use, how to avoid them, and how to cope with cravings. It is most effective in person or by phone but is also available in self-help forms (books and Internet websites).  Support groups. These provide emotional support, advice, and guidance for quitting tobacco.  The most effective treatment for TUD is usually a combination of medicine, talk therapy, and support groups. Follow these instructions at home:  Keep all follow-up visits as directed by your health care provider. This is important.  Take medicines only as directed by your health care provider.  Check with your health care provider before starting new prescription or over-the-counter medicines. Contact a health care provider if:  You are not able to take your medicines as prescribed.  Treatment is not helping your TUD and your symptoms get worse. Get help right away if:  You have serious thoughts about hurting yourself or others.  You have trouble breathing, chest pain, sudden weakness, or sudden numbness in part of your body. This information is not intended to replace advice given to you by your health care provider. Make sure you discuss any questions you have with your health care provider. Document Released: 03/12/2004 Document Revised: 03/09/2016 Document Reviewed: 09/02/2013 Elsevier Interactive Patient Education  [employer].

## 2018-05-13 NOTE — Progress Notes (Signed)
FOLLOW UP 6 MONTH  Assessment and Plan:   CAD S/P percutaneous coronary angioplasty and PCI- DES mid LAD in the setting of non-STEMI Follows with Cardiology ASA & Plavix continue medically controlled currently  Hypertension Well controlled with current medications  Monitor blood pressure at home; patient to call if consistently greater than 130/80 Continue DASH diet.   Reminder to go to the ER if any CP, SOB, nausea, dizziness, severe HA, changes vision/speech, left arm numbness and tingling and jaw pain.  Cholesterol Currently at goal;  Continue low cholesterol diet and exercise.  Check lipid panel.   Diabetes with diabetic neuropathy, unspecified Continue Metformin 500mg  XR, two tablets twice a day with food Does not check glucose at home Continue diet and exercise.  Perform daily foot/skin check, notify office of any concerning changes.  Check A1C Taking Zetia for cholesterol, myalgias with previous statin trials  Retinopathy associated with T2DM Aggressively control sugars and BP; followed by ophthalmology   Synovial cyst of lumbar facet joint Resolved s/p surgery for removal Followed by Dr. Vertell Limber   BPH (benign prostatic hyperplasia) cont meds not currently symptomatic with meds on board Will check PSA   Hyperlipidemia with statin intolerance Taking zetia, tolerating well Will check lipids  Obesity with co morbidities Long discussion about weight loss, diet, and exercise Recommended diet heavy in fruits and veggies and low in animal meats, cheeses, and dairy products, appropriate calorie intake Discussed ideal weight for height (below 183) and initial weight goal (190) Patient will work on increasing walking Will follow up in 3 months  Tobacco dependency -not ready to quit Will continue to assess  Vitamin D Def At not at goal last visit; continue supplementation to maintain goal of 70-100 Vit D level.  Follow up in three months.  Continue diet and  meds as discussed. Further disposition pending results of labs. Discussed med's effects and SE's.   Over 30 minutes of exam, counseling, chart review, and critical decision making was performed.   Future Appointments  Date Time Provider [place]  08/13/2018 10:15 AM Garnet Sierras, NP [facility] None  11/24/2018 10:00 AM [name], MD [facility] None    ----------------------------------------------------------------------------------------------------------------------  HPI 82 y.o. male  presents for 6 month follow up on hypertension, cholesterol, diabetes, weight and vitamin D deficiency. Reports that he had surgery back in March with Dr Vertell Limber who removed several spinal cysts.  Reports that he has not gotten back to some of his normal activities such a splitting wood or yard work.  He is able to complete some but for a short duration.  He is up to date with his eye exam and wears glasses.  Has bilateral hearing aids.  Follows with audiology every 3 months for cerumen removal.  BMI is Body mass index is 27.67 kg/m., he has not been working on diet and exercise. Wt Readings from Last 3 Encounters:  05/13/18 195 lb 9.6 oz (88.7 kg)  03/23/18 197 lb (89.4 kg)  02/03/18 196 lb (88.9 kg)    His blood pressure has been controlled at home, today their BP is BP: 114/68  He does workout. He denies chest pain, shortness of breath, dizziness.   He is on cholesterol medication Zetia and denies myalgias. His cholesterol is not at goal. The cholesterol last visit was:   Lab Results  Component Value Date   CHOL 176 02/03/2018   HDL 53 02/03/2018   LDLCALC 99 02/03/2018   TRIG 139 02/03/2018   CHOLHDL 3.3 02/03/2018  He has been working on diet and exercise for prediabetes, and denies foot ulcerations and hyperglycemia. Last A1C in the office was:  Lab Results  Component Value Date   HGBA1C 5.8 (H) 02/03/2018   Patient is on Vitamin D supplement.   Lab Results  Component  Value Date   VD25OH 52 10/22/2017        Current Medications:  Current Outpatient Medications on File Prior to Visit  Medication Sig  . aspirin EC 81 MG tablet Take 1 tablet (81 mg total) by mouth every other day.  . Cholecalciferol (VITAMIN D) 2000 units CAPS Take 2 capsules by mouth.  Marland Kitchen CINNAMON PO Take 1,000 mg by mouth daily.   . clopidogrel (PLAVIX) 75 MG tablet Take 75 mg by mouth daily.  Marland Kitchen doxazosin (CARDURA) 4 MG tablet TAKE 1 TABLET BY MOUTH EVERYDAY AT BEDTIME  . ezetimibe (ZETIA) 10 MG tablet TAKE 1 TABLET BY MOUTH EVERY DAY  . finasteride (PROSCAR) 5 MG tablet Take 5 mg by mouth daily.  Marland Kitchen lisinopril (PRINIVIL,ZESTRIL) 5 MG tablet TAKE 1 TABLET BY MOUTH DAILY.  . Magnesium 500 MG CAPS Take 1 capsule by mouth daily.  . metFORMIN (GLUCOPHAGE-XR) 500 MG 24 hr tablet TAKE 2 TABLETS BY MOUTH TWICE A DAY   No current facility-administered medications on file prior to visit.      Allergies:  Allergies  Allergen Reactions  . Atorvastatin Other (See Comments)    MYALGIAS CRAMPING  . Crestor [Rosuvastatin]     MYALGIAS CRAMPING  . Statins     MYALGIAS CRAMPING  . Tetanus Toxoids     UNSPECIFIED REACTION      Medical History:  Past Medical History:  Diagnosis Date  . Arthritis   . BPH (benign prostatic hyperplasia)   . CAD S/P percutaneous coronary angioplasty 12/2009; 08/2010   a) 6/'22 - NSTEMI: PCI to LAD: Promus Element 2.5 mm x 15 mm DES; b) Cath for Angina: Prox LAD 60-70% pre-stent with FFR 0.82, 90% RVM -- Med Rx, EF 50-55%  . Essential hypertension   . Hyperlipidemia with target LDL less than 70    Statin intolerance  . Non-ST elevation MI (NSTEMI) [facility]) June 2011   PCI to LAD  . Pneumonia    DEC 2016  [place] WITH ANTIBIOTIC  . Type 2 diabetes mellitus without complications (HCC)    Family history- Reviewed and unchanged Social history- Reviewed and unchanged   Review of Systems:  Review of Systems  Constitutional: Negative for chills, diaphoresis,  fever, malaise/fatigue and weight loss.  HENT: Positive for hearing loss. Negative for congestion, ear discharge, ear pain, nosebleeds, sinus pain, sore throat and tinnitus.        Uses hearing aides, does well one on one, difficultly with background noise.  Eyes: Negative for blurred vision, double vision, photophobia, pain, discharge and redness.       Last eye exam 4 months ago.  Hx: bilateral lens replacements.  Wears glasses.  Respiratory: Positive for cough. Negative for hemoptysis, sputum production, shortness of breath, wheezing and stridor.        Chronic, productive  Cardiovascular: Negative for chest pain, palpitations, orthopnea, claudication, leg swelling and PND.  Gastrointestinal: Positive for constipation. Negative for abdominal pain, blood in stool, diarrhea, heartburn, melena, nausea and vomiting.  Genitourinary: Negative for dysuria, flank pain, frequency, hematuria and urgency.  Musculoskeletal: Positive for joint pain. Negative for back pain, falls, myalgias and neck pain.       Right hip soreness with  some right leg numbness with laying down and some weakness with right leg as well related to LUMBAR LAMINECTOMY/DECOMPRESSION MICRODISCECTOMY 09/2017.  Skin: Negative for itching and rash.  Neurological: Positive for weakness. Negative for dizziness, tingling, sensory change, speech change, focal weakness, loss of consciousness and headaches.       Right leg  Endo/Heme/Allergies: Negative for environmental allergies and polydipsia. Does not bruise/bleed easily.  Psychiatric/Behavioral: Negative for depression, hallucinations, memory loss, substance abuse and suicidal ideas. The patient is not nervous/anxious and does not have insomnia.       Physical Exam: BP 114/68   Pulse (!) 53   Temp 98 F (36.7 C)   Resp 14   Ht 5' 10.5" (1.791 m)   Wt 195 lb 9.6 oz (88.7 kg)   SpO2 99%   BMI 27.67 kg/m  Wt Readings from Last 3 Encounters:  05/13/18 195 lb 9.6 oz (88.7 kg)   03/23/18 197 lb (89.4 kg)  02/03/18 196 lb (88.9 kg)   General Appearance: Well nourished, in no apparent distress. Eyes: PERRLA, EOMs, conjunctiva no swelling or erythema Sinuses: No Frontal/maxillary tenderness ENT/Mouth: Ext aud canals clear, TMs without erythema, bulging. No erythema, swelling, or exudate on post pharynx.  Tonsils not swollen or erythematous. Hearing aids bilateral Neck: Supple, thyroid normal.  Respiratory: Respiratory effort normal, BS equal bilaterally without rales, rhonchi, wheezing or stridor.  Cardio: RRR with no MRGs. Brisk peripheral pulses without edema.  Abdomen: Soft, + BS.  Non tender, no guarding, rebound, hernias, masses. Lymphatics: Non tender without lymphadenopathy.  Musculoskeletal: Full ROM, 5/5 strength, Normal gait, slow related to sensory deficit RLE. Skin: Warm, dry without rashes, lesions, ecchymosis.  Neuro: Cranial nerves intact. No cerebellar symptoms. Decreased sensation RLE. Psych: Awake and oriented X 3, normal affect, Insight and Judgment appropriate.    Garnet Sierras, NP 12:46 PM [facility]

## 2018-05-14 ENCOUNTER — Ambulatory Visit: Payer: Self-pay | Admitting: Internal Medicine

## 2018-05-14 ENCOUNTER — Encounter: Payer: Self-pay | Admitting: Adult Health Nurse Practitioner

## 2018-05-17 ENCOUNTER — Encounter: Payer: Self-pay | Admitting: Adult Health Nurse Practitioner

## 2018-05-17 LAB — LIPID PANEL
Cholesterol: 184 mg/dL (ref <200)
HDL: 53 mg/dL (ref >40)
LDL Cholesterol (Calc): 106 mg/dL (calc) — ABNORMAL HIGH
Non-HDL Cholesterol (Calc): 131 mg/dL (calc) — ABNORMAL HIGH (ref <130)
Total CHOL/HDL Ratio: 3.5 (calc) (ref <5.0)
Triglycerides: 136 mg/dL (ref <150)

## 2018-05-17 LAB — CBC WITH DIFFERENTIAL/PLATELET
Basophils Absolute: 73 cells/uL (ref 0–200)
Basophils Relative: 1 %
Eosinophils Absolute: 365 {cells}/uL (ref 15–500)
Eosinophils Relative: 5 %
HCT: 47.4 % (ref 38.5–50.0)
Hemoglobin: 16 g/dL (ref 13.2–17.1)
Lymphs Abs: 1445 cells/uL (ref 850–3900)
MCH: 31.4 pg (ref 27.0–33.0)
MCHC: 33.8 g/dL (ref 32.0–36.0)
MCV: 92.9 fL (ref 80.0–100.0)
MPV: 10.8 fL (ref 7.5–12.5)
Monocytes Relative: 10.6 %
Neutro Abs: 4643 cells/uL (ref 1500–7800)
Neutrophils Relative %: 63.6 %
Platelets: 245 Thousand/uL (ref 140–400)
RBC: 5.1 Million/uL (ref 4.20–5.80)
RDW: 12.4 % (ref 11.0–15.0)
Total Lymphocyte: 19.8 %
WBC mixed population: 774 {cells}/uL (ref 200–950)
WBC: 7.3 Thousand/uL (ref 3.8–10.8)

## 2018-05-17 LAB — COMPLETE METABOLIC PANEL WITHOUT GFR
AG Ratio: 1.7 (calc) (ref 1.0–2.5)
Alkaline phosphatase (APISO): 60 U/L (ref 40–115)
BUN: 12 mg/dL (ref 7–25)
Chloride: 102 mmol/L (ref 98–110)
GFR, Est African American: 98 mL/min/1.73m2 (ref >=60)
Globulin: 2.5 g/dL (ref 1.9–3.7)
Glucose, Bld: 75 mg/dL (ref 65–99)
Potassium: 4.4 mmol/L (ref 3.5–5.3)
Sodium: 139 mmol/L (ref 135–146)
Total Bilirubin: 0.6 mg/dL (ref 0.2–1.2)

## 2018-05-17 LAB — PSA: PSA: 1 ng/mL (ref <=4.0)

## 2018-05-17 LAB — COMPLETE METABOLIC PANEL WITH GFR
ALT: 17 U/L (ref 9–46)
AST: 16 U/L (ref 10–35)
Albumin: 4.3 g/dL (ref 3.6–5.1)
CO2: 28 mmol/L (ref 20–32)
Calcium: 9.9 mg/dL (ref 8.6–10.3)
Creat: 0.79 mg/dL (ref 0.70–1.11)
GFR, Est Non African American: 85 mL/min/{1.73_m2} (ref >=60)
Total Protein: 6.8 g/dL (ref 6.1–8.1)

## 2018-05-17 LAB — HEMOGLOBIN A1C
Hgb A1c MFr Bld: 5.8 %{Hb} — ABNORMAL HIGH (ref <5.7)
Mean Plasma Glucose: 120 (calc)
eAG (mmol/L): 6.6 (calc)

## 2018-05-17 LAB — TEST AUTHORIZATION

## 2018-05-17 LAB — TSH: TSH: 2.27 mIU/L (ref 0.40–4.50)

## 2018-05-17 LAB — VITAMIN D 25 HYDROXY (VIT D DEFICIENCY, FRACTURES): Vit D, 25-Hydroxy: 90 ng/mL (ref 30–100)

## 2018-05-17 LAB — MAGNESIUM: Magnesium: 2 mg/dL (ref 1.5–2.5)

## 2018-05-19 ENCOUNTER — Ambulatory Visit (INDEPENDENT_AMBULATORY_CARE_PROVIDER_SITE_OTHER): Payer: Medicare Other | Admitting: Internal Medicine

## 2018-05-19 VITALS — BP 150/72 | HR 60 | Temp 97.5°F | Resp 18 | Ht 70.5 in | Wt 195.4 lb

## 2018-05-19 DIAGNOSIS — R42 Dizziness and giddiness: Secondary | ICD-10-CM | POA: Diagnosis not present

## 2018-05-19 DIAGNOSIS — Z79899 Other long term (current) drug therapy: Secondary | ICD-10-CM

## 2018-05-19 DIAGNOSIS — R112 Nausea with vomiting, unspecified: Secondary | ICD-10-CM | POA: Diagnosis not present

## 2018-05-19 DIAGNOSIS — R197 Diarrhea, unspecified: Secondary | ICD-10-CM

## 2018-05-19 DIAGNOSIS — I2510 Atherosclerotic heart disease of native coronary artery without angina pectoris: Secondary | ICD-10-CM

## 2018-05-19 MED ORDER — MECLIZINE HCL 25 MG PO TABS: 25.0000 mg | ORAL_TABLET | Freq: Three times a day (TID) | ORAL | 0 refills | Status: DC | PRN

## 2018-05-19 MED ORDER — PREDNISONE 20 MG PO TABS: ORAL_TABLET | ORAL | 0 refills | Status: DC

## 2018-05-20 LAB — COMPLETE METABOLIC PANEL WITH GFR
AG RATIO: 1.8 (calc) (ref 1.0–2.5)
ALKALINE PHOSPHATASE (APISO): 65 U/L (ref 40–115)
ALT: 17 U/L (ref 9–46)
AST: 19 U/L (ref 10–35)
Albumin: 4.3 g/dL (ref 3.6–5.1)
BUN: 13 mg/dL (ref 7–25)
CALCIUM: 9.6 mg/dL (ref 8.6–10.3)
CO2: 28 mmol/L (ref 20–32)
CREATININE: 0.79 mg/dL (ref 0.70–1.11)
Chloride: 103 mmol/L (ref 98–110)
GFR, Est African American: 98 mL/min/{1.73_m2} (ref >=60)
GFR, Est Non African American: 85 mL/min/{1.73_m2} (ref >=60)
GLOBULIN: 2.4 g/dL (ref 1.9–3.7)
Glucose, Bld: 99 mg/dL (ref 65–99)
POTASSIUM: 4.4 mmol/L (ref 3.5–5.3)
SODIUM: 139 mmol/L (ref 135–146)
TOTAL PROTEIN: 6.7 g/dL (ref 6.1–8.1)
Total Bilirubin: 0.4 mg/dL (ref 0.2–1.2)

## 2018-05-20 LAB — CBC WITH DIFFERENTIAL/PLATELET
BASOS ABS: 60 {cells}/uL (ref 0–200)
Basophils Relative: 0.7 %
EOS ABS: 281 {cells}/uL (ref 15–500)
Eosinophils Relative: 3.3 %
HEMATOCRIT: 46.4 % (ref 38.5–50.0)
HEMOGLOBIN: 16 g/dL (ref 13.2–17.1)
Lymphs Abs: 1598 cells/uL (ref 850–3900)
MCH: 31.8 pg (ref 27.0–33.0)
MCHC: 34.5 g/dL (ref 32.0–36.0)
MCV: 92.2 fL (ref 80.0–100.0)
MONOS PCT: 10.7 %
MPV: 11 fL (ref 7.5–12.5)
Neutro Abs: 5653 cells/uL (ref 1500–7800)
Neutrophils Relative %: 66.5 %
Platelets: 248 10*3/uL (ref 140–400)
RBC: 5.03 10*6/uL (ref 4.20–5.80)
RDW: 12.3 % (ref 11.0–15.0)
Total Lymphocyte: 18.8 %
WBC mixed population: 910 cells/uL (ref 200–950)
WBC: 8.5 10*3/uL (ref 3.8–10.8)

## 2018-05-20 LAB — MAGNESIUM: MAGNESIUM: 2 mg/dL (ref 1.5–2.5)

## 2018-05-23 ENCOUNTER — Encounter: Payer: Self-pay | Admitting: Internal Medicine

## 2018-05-23 NOTE — Progress Notes (Signed)
Subjective:    Patient ID: David [name], male    DOB: February 03, 1936, 82 y.o.   MRN: [id number]  HPI    This very nice 82 yo MWM with HTN, HLD, ASCAD, T2_DM presents with acute onset of "spinning head" w/initial associated Nausea progressively worse over the last 7 days. This am, his sx's of vertigo worsened & he also had an episode of diarrhea. EMS came to his house and did an EKG which was Normal and he was felt stable for out-patient f/u.   Medication Sig  . aspirin EC 81 MG tablet Take 1 tablet (81 mg total) by mouth every other day.  Marland Kitchen VITAMIN D 2000 units  Take 2 capsules by mouth.  Marland Kitchen CINNAMON PO Take 1,000 mg by mouth daily.   . clopidogrel75 MG tablet Take 75 mg by mouth daily.  Marland Kitchen doxazosin4 MG tablet TAKE 1 TABLET BY MOUTH EVERYDAY AT BEDTIME  . ezetimibe 10 MG tablet TAKE 1 TABLET BY MOUTH EVERY DAY  . finasteride  5 MG tablet Take 5 mg by mouth daily.  Marland Kitchen lisinopril  5 MG tablet TAKE 1 TABLET BY MOUTH DAILY.  . Magnesium 500 MG CAPS Take 1 capsule by mouth daily.  . metFORMIN-XR 500 MG  TAKE 2 TABLETS BY MOUTH TWICE A DAY   Allergies  Allergen Reactions  . Atorvastatin Other (See Comments)    MYALGIAS CRAMPING  . Crestor [Rosuvastatin]     MYALGIAS CRAMPING  . Statins     MYALGIAS CRAMPING  . Tetanus Toxoids     UNSPECIFIED REACTION    Past Medical History:  Diagnosis Date  . Arthritis   . BPH (benign prostatic hyperplasia)   . CAD S/P percutaneous coronary angioplasty 12/2009; 08/2010   a) 6/'22 - NSTEMI: PCI to LAD: Promus Element 2.5 mm x 15 mm DES; b) Cath for Angina: Prox LAD 60-70% pre-stent with FFR 0.82, 90% RVM -- Med Rx, EF 50-55%  . Essential hypertension   . Hyperlipidemia with target LDL less than 70    Statin intolerance  . Non-ST elevation MI (NSTEMI) [facility]) June 2011   PCI to LAD  . Pneumonia    DEC 2016  [place] WITH ANTIBIOTIC  . Type 2 diabetes mellitus without complications ([place])     Review of Systems    10 point systems review negative except as  above.     Objective:   Physical Exam  BP (!) 150/72   Pulse 60   Temp (!) 97.5 F (36.4 C)   Resp 18   Ht 5' 10.5" (1.791 m)   Wt 195 lb 6.4 oz (88.6 kg)   BMI 27.64 kg/m   HEENT - WNL. Neck - supple.  Chest - Clear equal BS. Cor - Nl HS. RRR w/o sig MGR. PP 1(+). No edema. MS- FROM w/o deformities.  Gait Nl. Neuro -  Nl w/o focal abnormalities.    Assessment & Plan:   1. Vertigo  - CBC with Differential/Platelet - COMPLETE METABOLIC PANEL WITH GFR - Magnesium - predniSONE (DELTASONE) 20 MG tablet; 1 tab 3 x day for 2 days, then 1 tab 2 x day for 2 days, then 1 tab 1 x day for 3 days  Dispense: 13 tablet; Refill: 0 - meclizine (ANTIVERT) 25 MG tablet; Take 1 tablet (25 mg total) by mouth 3 (three) times daily as needed for dizziness.  Dispense: 30 tablet; Refill: 0  2. Nausea and vomiting, intractability of vomiting not specified, unspecified vomiting type  -  CBC with Differential/Platelet - COMPLETE METABOLIC PANEL WITH GFR - Magnesium  3. Diarrhea  - CBC with Differential/Platelet - COMPLETE METABOLIC PANEL WITH GFR - Magnesium  4. Medication management  - CBC with Differential/Platelet - COMPLETE METABOLIC PANEL WITH GFR - Magnesium

## 2018-07-03 ENCOUNTER — Other Ambulatory Visit: Payer: Self-pay | Admitting: Adult Health

## 2018-08-12 DIAGNOSIS — L723 Sebaceous cyst: Secondary | ICD-10-CM | POA: Diagnosis not present

## 2018-08-12 DIAGNOSIS — D229 Melanocytic nevi, unspecified: Secondary | ICD-10-CM | POA: Diagnosis not present

## 2018-08-13 ENCOUNTER — Encounter: Payer: Self-pay | Admitting: Adult Health Nurse Practitioner

## 2018-08-13 ENCOUNTER — Ambulatory Visit (INDEPENDENT_AMBULATORY_CARE_PROVIDER_SITE_OTHER): Payer: Medicare Other | Admitting: Adult Health Nurse Practitioner

## 2018-08-13 VITALS — BP 118/66 | HR 58 | Temp 97.2°F | Wt 196.8 lb

## 2018-08-13 DIAGNOSIS — F17200 Nicotine dependence, unspecified, uncomplicated: Secondary | ICD-10-CM | POA: Diagnosis not present

## 2018-08-13 DIAGNOSIS — I700 Atherosclerosis of aorta: Secondary | ICD-10-CM | POA: Diagnosis not present

## 2018-08-13 DIAGNOSIS — E663 Overweight: Secondary | ICD-10-CM

## 2018-08-13 DIAGNOSIS — Z79899 Other long term (current) drug therapy: Secondary | ICD-10-CM

## 2018-08-13 DIAGNOSIS — E559 Vitamin D deficiency, unspecified: Secondary | ICD-10-CM | POA: Diagnosis not present

## 2018-08-13 DIAGNOSIS — D692 Other nonthrombocytopenic purpura: Secondary | ICD-10-CM

## 2018-08-13 DIAGNOSIS — Z9861 Coronary angioplasty status: Secondary | ICD-10-CM | POA: Diagnosis not present

## 2018-08-13 DIAGNOSIS — R350 Frequency of micturition: Secondary | ICD-10-CM

## 2018-08-13 DIAGNOSIS — N401 Enlarged prostate with lower urinary tract symptoms: Secondary | ICD-10-CM

## 2018-08-13 DIAGNOSIS — I10 Essential (primary) hypertension: Secondary | ICD-10-CM | POA: Diagnosis not present

## 2018-08-13 DIAGNOSIS — E11319 Type 2 diabetes mellitus with unspecified diabetic retinopathy without macular edema: Secondary | ICD-10-CM | POA: Diagnosis not present

## 2018-08-13 DIAGNOSIS — I2510 Atherosclerotic heart disease of native coronary artery without angina pectoris: Secondary | ICD-10-CM

## 2018-08-13 DIAGNOSIS — M7138 Other bursal cyst, other site: Secondary | ICD-10-CM | POA: Diagnosis not present

## 2018-08-13 DIAGNOSIS — E1121 Type 2 diabetes mellitus with diabetic nephropathy: Secondary | ICD-10-CM | POA: Diagnosis not present

## 2018-08-13 NOTE — Patient Instructions (Addendum)
We will contact you in 1-2 days with your lab results through [facility].  You are due for tetanus vaccination as we discussed today.  Should you get cut by metal you are at increased risk of getting tetanus which is preventable.   Your health maintenance is up to date. Important to keep regular eye exams and dental appointments.   We Do NOT Approve of  [facility], [employer]  Our Patients  To Do Home Visits  & We Do NOT Approve of [facility] > > > > > > > > > > > > > > > > > > > > > > > > > > > > > > > > > > >  > > > >   Preventive Care for Adults  A healthy lifestyle and preventive care can promote health and wellness. Preventive health guidelines for men include the following key practices:  A routine yearly physical is a good way to check with your health care provider about your health and preventative screening. It is a chance to share any concerns and updates on your health and to receive a thorough exam.  Visit your dentist for a routine exam and preventative care every 6 months. Brush your teeth twice a day and floss once a day. Good oral hygiene prevents tooth decay and gum disease.  The frequency of eye exams is based on your age, health, family medical history, use of contact lenses, and other factors. Follow your health care provider's recommendations for frequency of eye exams.  Eat a healthy diet. Foods such as vegetables, fruits, whole grains, low-fat dairy products, and lean protein foods contain the nutrients you need without too many calories. Decrease your intake of foods high in solid fats, added sugars, and salt. Eat the right amount of calories for you. Get information about a proper diet from your health care provider, if necessary.  Regular physical exercise is one of the most important things you can do for your health. Most adults should get at least 150 minutes of moderate-intensity exercise (any activity that increases your heart  rate and causes you to sweat) each week. In addition, most adults need muscle-strengthening exercises on 2 or more days a week.  Maintain a healthy weight. The body mass index (BMI) is a screening tool to identify possible weight problems. It provides an estimate of body fat based on height and weight. Your health care provider can find your BMI and can help you achieve or maintain a healthy weight. For adults 20 years and older:  A BMI below 18.5 is considered underweight.  A BMI of 18.5 to 24.9 is normal.  A BMI of 25 to 29.9 is considered overweight.  A BMI of 30 and above is considered obese.  Maintain normal blood lipids and cholesterol levels by exercising and minimizing your intake of saturated fat. Eat a balanced diet with plenty of fruit and vegetables. Blood tests for lipids and cholesterol should begin at age 42 and be repeated every 5 years. If your lipid or cholesterol levels are high, you are over 50, or you are at high risk for heart disease, you may need your cholesterol levels checked more frequently. Ongoing high lipid and cholesterol levels should be treated with medicines if diet and exercise are not working.  If you smoke, find out from your health care provider how to quit. If you do not use tobacco, do not start.  Lung cancer screening is recommended for adults aged 68-80  years who are at high risk for developing lung cancer because of a history of smoking. A yearly low-dose CT scan of the lungs is recommended for people who have at least a 30-pack-year history of smoking and are a current smoker or have quit within the past 15 years. A pack year of smoking is smoking an average of 1 pack of cigarettes a day for 1 year (for example: 1 pack a day for 30 years or 2 packs a day for 15 years). Yearly screening should continue until the smoker has stopped smoking for at least 15 years. Yearly screening should be stopped for people who develop a health problem that would prevent them  from having lung cancer treatment.  If you choose to drink alcohol, do not have more than 2 drinks per day. One drink is considered to be 12 ounces (355 mL) of beer, 5 ounces (148 mL) of wine, or 1.5 ounces (44 mL) of liquor.  Avoid use of street drugs. Do not share needles with anyone. Ask for help if you need support or instructions about stopping the use of drugs.  High blood pressure causes heart disease and increases the risk of stroke. Your blood pressure should be checked at least every 1-2 years. Ongoing high blood pressure should be treated with medicines, if weight loss and exercise are not effective.  If you are 74-45 years old, ask your health care provider if you should take aspirin to prevent heart disease.  Diabetes screening involves taking a blood sample to check your fasting blood sugar level. Testing should be considered at a younger age or be carried out more frequently if you are overweight and have at least 1 risk factor for diabetes.  Colorectal cancer can be detected and often prevented. Most routine colorectal cancer screening begins at the age of 2 and continues through age 73. However, your health care provider may recommend screening at an earlier age if you have risk factors for colon cancer. On a yearly basis, your health care provider may provide home test kits to check for hidden blood in the stool. Use of a small camera at the end of a tube to directly examine the colon (sigmoidoscopy or colonoscopy) can detect the earliest forms of colorectal cancer. Talk to your health care provider about this at age 73, when routine screening begins. Direct exam of the colon should be repeated every 5-10 years through age 2, unless early forms of precancerous polyps or small growths are found.  Hepatitis C blood testing is recommended for all people born from [birth year] through 1963 and any individual with known risks for hepatitis C.  Screening for abdominal aortic aneurysm (AAA)  by  ultrasound is recommended for people who have history of high blood pressure or who are current or former smokers.  Healthy men should  receive prostate-specific antigen (PSA) blood tests as part of routine cancer screening. Talk with your health care provider about prostate cancer screening.  Testicular cancer screening is  recommended for adult males. Screening includes self-exam, a health care provider exam, and other screening tests. Consult with your health care provider about any symptoms you have or any concerns you have about testicular cancer.  Use sunscreen. Apply sunscreen liberally and repeatedly throughout the day. You should seek shade when your shadow is shorter than you. Protect yourself by wearing long sleeves, pants, a wide-brimmed hat, and sunglasses year round, whenever you are outdoors.  Once a month, do a whole-body skin exam, using a  mirror to look at the skin on your back. Tell your health care provider about new moles, moles that have irregular borders, moles that are larger than a pencil eraser, or moles that have changed in shape or color.  Stay current with required vaccines (immunizations).  Influenza vaccine. All adults should be immunized every year.  Tetanus, diphtheria, and acellular pertussis (Td, Tdap) vaccine. An adult who has not previously received Tdap or who does not know his vaccine status should receive 1 dose of Tdap. This initial dose should be followed by tetanus and diphtheria toxoids (Td) booster doses every 10 years. Adults with an unknown or incomplete history of completing a 3-dose immunization series with Td-containing vaccines should begin or complete a primary immunization series including a Tdap dose. Adults should receive a Td booster every 10 years.  Zoster vaccine. One dose is recommended for adults aged 39 years or older unless certain conditions are present.    PREVNAR - Pneumococcal 13-valent conjugate (PCV13) vaccine. When indicated, a  person who is uncertain of his immunization history and has no record of immunization should receive the PCV13 vaccine. An adult aged 75 years or older who has certain medical conditions and has not been previously immunized should receive 1 dose of PCV13 vaccine. This PCV13 should be followed with a dose of pneumococcal polysaccharide (PPSV23) vaccine. The PPSV23 vaccine dose should be obtained 1 or more year(s)after the dose of PCV13 vaccine. An adult aged 61 years or older who has certain medical conditions and previously received 1 or more doses of PPSV23 vaccine should receive 1 dose of PCV13. The PCV13 vaccine dose should be obtained 1 or more years after the last PPSV23 vaccine dose.    PNEUMOVAX - Pneumococcal polysaccharide (PPSV23) vaccine. When PCV13 is also indicated, PCV13 should be obtained first. All adults aged 68 years and older should be immunized. An adult younger than age 85 years who has certain medical conditions should be immunized. Any person who resides in a nursing home or long-term care facility should be immunized. An adult smoker should be immunized. People with an immunocompromised condition and certain other conditions should receive both PCV13 and PPSV23 vaccines. People with human immunodeficiency virus (HIV) infection should be immunized as soon as possible after diagnosis. Immunization during chemotherapy or radiation therapy should be avoided. Routine use of PPSV23 vaccine is not recommended for American Indians, [place] Natives, or people younger than 65 years unless there are medical conditions that require PPSV23 vaccine. When indicated, people who have unknown immunization and have no record of immunization should receive PPSV23 vaccine. One-time revaccination 5 years after the first dose of PPSV23 is recommended for people aged 19-64 years who have chronic kidney failure, nephrotic syndrome, asplenia, or immunocompromised conditions. People who received 1-2 doses of PPSV23  before age 76 years should receive another dose of PPSV23 vaccine at age 84 years or later if at least 5 years have passed since the previous dose. Doses of PPSV23 are not needed for people immunized with PPSV23 at or after age 25 years.    Hepatitis A vaccine. Adults who wish to be protected from this disease, have certain high-risk conditions, work with hepatitis A-infected animals, work in hepatitis A research labs, or travel to or work in countries with a high rate of hepatitis A should be immunized. Adults who were previously unvaccinated and who anticipate close contact with an international adoptee during the first 60 days after arrival in the [place] from a country with  a high rate of hepatitis A should be immunized.    Hepatitis B vaccine. Adults should be immunized if they wish to be protected from this disease, have certain high-risk conditions, may be exposed to blood or other infectious body fluids, are household contacts or sex partners of hepatitis B positive people, are clients or workers in certain care facilities, or travel to or work in countries with a high rate of hepatitis B.   Preventive Service / Frequency   Ages 18 and over  Blood pressure check.  Lipid and cholesterol check.  Lung cancer screening. / Every year if you are aged 60-80 years and have a 30-pack-year history of smoking and currently smoke or have quit within the past 15 years. Yearly screening is stopped once you have quit smoking for at least 15 years or develop a health problem that would prevent you from having lung cancer treatment.  Fecal occult blood test (FOBT) of stool. You may not have to do this test if you get a colonoscopy every 10 years.  Flexible sigmoidoscopy** or colonoscopy.** / Every 5 years for a flexible sigmoidoscopy or every 10 years for a colonoscopy beginning at age 23 and continuing until age 40.  Hepatitis C blood test.** / For all people born from [birth year] through 1963 and  any individual with known risks for hepatitis C.  Abdominal aortic aneurysm (AAA) screening./ Screening current or former smokers or have Hypertension.  Skin self-exam. / Monthly.  Influenza vaccine. / Every year.  Tetanus, diphtheria, and acellular pertussis (Tdap/Td) vaccine.** / 1 dose of Td every 10 years.   Zoster vaccine.** / 1 dose for adults aged 27 years or older.         Pneumococcal 13-valent conjugate (PCV13) vaccine.    Pneumococcal polysaccharide (PPSV23) vaccine.     Hepatitis A vaccine.** / Consult your health care provider.  Hepatitis B vaccine.** / Consult your health care provider. Screening for abdominal aortic aneurysm (AAA)  by ultrasound is recommended for people who have history of high blood pressure or who are current or former smokers. ++++++++++ Recommend Adult Low Dose Aspirin or  coated  Aspirin 81 mg daily  To reduce risk of Colon Cancer 20 %,  Skin Cancer 26 % ,  Malignant Melanoma 46%  and  Pancreatic cancer 60% ++++++++++++++++++++++ Vitamin D goal  is between 70-100.  Please make sure that you are taking your Vitamin D as directed.  It is very important as a natural anti-inflammatory  helping hair, skin, and nails, as well as reducing stroke and heart attack risk.  It helps your bones and helps with mood. It also decreases numerous cancer risks so please take it as directed.  Low Vit D is associated with a 200-300% higher risk for CANCER  and 200-300% higher risk for HEART   ATTACK  &  STROKE.   .....................................Marland Kitchen It is also associated with higher death rate at younger ages,  autoimmune diseases like Rheumatoid arthritis, Lupus, Multiple Sclerosis.    Also many other serious conditions, like depression, Alzheimer's Dementia, infertility, muscle aches, fatigue, fibromyalgia - just to name a few. ++++++++++++++++++++++ Recommend the book "The END of DIETING" by Dr Excell Seltzer  & the book "The END of DIABETES " by  Dr Excell Seltzer At [email or web address] - get book & Audio CD's    Being diabetic has a  300% increased risk for heart attack, stroke, cancer, and alzheimer- type vascular dementia. It is very important that you work harder with diet  by avoiding all foods that are white. Avoid white rice (brown & wild rice is OK), white potatoes (sweetpotatoes in moderation is OK), White bread or wheat bread or anything made out of white flour like bagels, donuts, rolls, buns, biscuits, cakes, pastries, cookies, pizza crust, and pasta (made from white flour & egg whites) - vegetarian pasta or spinach or wheat pasta is OK. Multigrain breads like Arnold's or Pepperidge [employer], or multigrain sandwich thins or flatbreads.  Diet, exercise and weight loss can reverse and cure diabetes in the early stages.  Diet, exercise and weight loss is very important in the control and prevention of complications of diabetes which affects every system in your body, ie. Brain - dementia/stroke, eyes - glaucoma/blindness, heart - heart attack/heart failure, kidneys - dialysis, stomach - gastric paralysis, intestines - malabsorption, nerves - severe painful neuritis, circulation - gangrene & loss of a leg(s), and finally cancer and Alzheimers.    I recommend avoid fried & greasy foods,  sweets/candy, white rice (brown or wild rice or Quinoa is OK), white potatoes (sweet potatoes are OK) - anything made from white flour - bagels, doughnuts, rolls, buns, biscuits,white and wheat breads, pizza crust and traditional pasta made of white flour & egg white(vegetarian pasta or spinach or wheat pasta is OK).  Multi-grain bread is OK - like multi-grain flat bread or sandwich thins. Avoid alcohol in excess. Exercise is also important.    Eat all the vegetables you want - avoid meat, especially red meat and dairy - especially cheese.  Cheese is the most concentrated form of trans-fats which is the worst thing to clog up our arteries. Veggie cheese is OK which can be found  in the fresh produce section at [employer] or [employer] or [employer]  ++++++++++++++++++++++ DASH Eating Plan  DASH stands for "Dietary Approaches to Stop Hypertension."   The DASH eating plan is a healthy eating plan that has been shown to reduce high blood pressure (hypertension). Additional health benefits may include reducing the risk of type 2 diabetes mellitus, heart disease, and stroke. The DASH eating plan may also help with weight loss. WHAT DO I NEED TO KNOW ABOUT THE DASH EATING PLAN? For the DASH eating plan, you will follow these general guidelines:  Choose foods with a percent daily value for sodium of less than 5% (as listed on the food label).  Use salt-free seasonings or herbs instead of table salt or sea salt.  Check with your health care provider or pharmacist before using salt substitutes.  Eat lower-sodium products, often labeled as "lower sodium" or "no salt added."  Eat fresh foods.  Eat more vegetables, fruits, and low-fat dairy products.  Choose whole grains. Look for the word "whole" as the first word in the ingredient list.  Choose fish   Limit sweets, desserts, sugars, and sugary drinks.  Choose heart-healthy fats.  Eat veggie cheese   Eat more home-cooked food and less restaurant, buffet, and fast food.  Limit fried foods.  Cook foods using methods other than frying.  Limit canned vegetables. If you do use them, rinse them well to decrease the sodium.  When eating at a restaurant, ask that your food be prepared with less salt, or no salt if possible.                      WHAT FOODS CAN I EAT? Read Dr Fara Olden Fuhrman's books on The End of Dieting & The End of Diabetes  Grains Whole  grain or whole wheat bread. Brown rice. Whole grain or whole wheat pasta. Quinoa, bulgur, and whole grain cereals. Low-sodium cereals. Corn or whole wheat flour tortillas. Whole grain cornbread. Whole grain crackers. Low-sodium crackers.  Vegetables Fresh or  frozen vegetables (raw, steamed, roasted, or grilled). Low-sodium or reduced-sodium tomato and vegetable juices. Low-sodium or reduced-sodium tomato sauce and paste. Low-sodium or reduced-sodium canned vegetables.   Fruits All fresh, canned (in natural juice), or frozen fruits.  Protein Products  All fish and seafood.  Dried beans, peas, or lentils. Unsalted nuts and seeds. Unsalted canned beans.  Dairy Low-fat dairy products, such as skim or 1% milk, 2% or reduced-fat cheeses, low-fat ricotta or cottage cheese, or plain low-fat yogurt. Low-sodium or reduced-sodium cheeses.  Fats and Oils Tub margarines without trans fats. Light or reduced-fat mayonnaise and salad dressings (reduced sodium). Avocado. Safflower, olive, or canola oils. Natural peanut or almond butter.  Other Unsalted popcorn and pretzels. The items listed above may not be a complete list of recommended foods or beverages. Contact your dietitian for more options.  ++++++++++++++++++++  WHAT FOODS ARE NOT RECOMMENDED? Grains/ White flour or wheat flour White bread. White pasta. White rice. Refined cornbread. Bagels and croissants. Crackers that contain trans fat.  Vegetables  Creamed or fried vegetables. Vegetables in a . Regular canned vegetables. Regular canned tomato sauce and paste. Regular tomato and vegetable juices.  Fruits Dried fruits. Canned fruit in light or heavy syrup. Fruit juice.  Meat and Other Protein Products Meat in general - RED meat & White meat.  Fatty cuts of meat. Ribs, chicken wings, all processed meats as bacon, sausage, bologna, salami, fatback, hot dogs, bratwurst and packaged luncheon meats.  Dairy Whole or 2% milk, cream, half-and-half, and cream cheese. Whole-fat or sweetened yogurt. Full-fat cheeses or blue cheese. Non-dairy creamers and whipped toppings. Processed cheese, cheese spreads, or cheese curds.  Condiments Onion and garlic salt, seasoned salt, table salt, and sea salt.  Canned and packaged gravies. Worcestershire sauce. Tartar sauce. Barbecue sauce. Teriyaki sauce. Soy sauce, including reduced sodium. Steak sauce. Fish sauce. Oyster sauce. Cocktail sauce. Horseradish. Ketchup and mustard. Meat flavorings and tenderizers. Bouillon cubes. Hot sauce. Tabasco sauce. Marinades. Taco seasonings. Relishes.  Fats and Oils Butter, stick margarine, lard, shortening and bacon fat. Coconut, palm kernel, or palm oils. Regular salad dressings.  Pickles and olives. Salted popcorn and pretzels.  The items listed above may not be a complete list of foods and beverages to avoid.

## 2018-08-13 NOTE — Progress Notes (Signed)
FOLLOW UP 3 Months  Assessment and Plan:   Diagnoses and all orders for this visit:  Aortic atherosclerosis (HCC) Monitor lipids, blood pressure and glucose control  CAD S/P percutaneous coronary angioplasty and PCI- DES mid LAD in the setting of non-STEMI Follows with Cardiology ASA & Plavix continue medically controlled currently  Essential Hypertension Well controlled with current medications  Monitor blood pressure at home; patient to call if consistently greater than 130/80 Continue DASH diet.   Reminder to go to the ER if any CP, SOB, nausea, dizziness, severe HA, changes vision/speech, left arm numbness and tingling and jaw pain.  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  Perform daily foot/skin check, notify office of any concerning changes.  Check A1C Taking Zetia for cholesterol, myalgias with previous statin trials  BPH (benign prostatic hyperplasia) continue meds Asymptomatic with current regiment, continue with benefit Will check PSA  Synovial cyst of lumbar facet joint Resolved s/p surgery for removal Followed by Dr. Vertell Limber  Tobacco dependency -not ready to quit Will continue to assess  Vitamin D Def At not at goal last visit; continue supplementation to maintain goal of 70-100 Vit D level.  Senile purpura (HCC) No S&S of bleeding  Taking ASA & Plavix Continue to monitor  Obesity with co morbidities Long discussion about weight loss, diet, and exercise Recommended diet heavy in fruits and veggies and low in animal meats, cheeses, and dairy products, appropriate calorie intake Discussed ideal  weight for height (below 183) and initial weight goal (190) Patient will work on increasing walking Will follow up in 3 months  Medication management -     CBC with Differential/Platelet -     COMPLETE METABOLIC PANEL WITH GFR -     Magnesium -     Lipid panel -     Hemoglobin A1c -     Insulin, random -     VITAMIN D 25 Hydroxy (Vit-D Deficiency, Fractures) -     PSA   Follow up in three months. Continue diet and meds as discussed. Further disposition pending results of labs. Discussed med's effects and SE's.   Over 30 minutes of exam, counseling, chart review, and critical decision making was performed.   Future Appointments  Date Time Provider [place]  11/24/2018 10:00 AM [name], MD [facility] None    ----------------------------------------------------------------------------------------------------------------------  HPI 83 y.o. David [name]  presents for 3 month follow up on hypertension, cholesterol, DMII,BPH, tobacco dependency, weight and vitamin D deficiency.   Surgery early, 09/2017, for cyst removal lumbar by Dr Vertell Limber.  Reports he is still recovering from this and this cyst has been a reoccurring problem for him.  BMI is Body mass index is 27.84 kg/m., he has not been working on diet and exercise. Wt Readings from Last 3 Encounters:  08/13/18 196 lb 12.8 oz (89.3 kg)  05/19/18 195 lb 6.4 oz (88.6 kg)  05/13/18 195 lb 9.6 oz (88.7 kg)    His blood pressure has been controlled at home, today their BP is BP: 118/66  He does not workout. He denies chest pain, shortness of breath, dizziness.   He is on cholesterol medication Zetia and denies myalgias. His cholesterol is at goal.  The cholesterol last visit was:   Lab Results  Component Value Date   CHOL 183 08/13/2018   HDL 54 08/13/2018   LDLCALC 109 (H) 08/13/2018   TRIG 108 08/13/2018   CHOLHDL 3.4 08/13/2018    He has not been working on diet and exercise for prediabetes, and denies  hyperglycemia, hypoglycemia , nausea, polydipsia and polyuria. Last A1C in the office was:  Lab Results  Component Value Date   HGBA1C 5.8 (H) 08/13/2018   Patient is on Vitamin D supplement.   Lab Results  Component Value Date   VD25OH 96 08/13/2018        Current Medications:  Current Outpatient Medications on File Prior to Visit  Medication Sig  . aspirin EC 81 MG tablet Take 1 tablet (81 mg total) by mouth every other day.  . Cholecalciferol (VITAMIN D) 2000 units CAPS Take 2 capsules by mouth.  Marland Kitchen CINNAMON PO Take 1,000 mg by mouth daily.   . clopidogrel (PLAVIX) 75 MG tablet Take 75 mg by mouth daily.  Marland Kitchen doxazosin (CARDURA) 4 MG tablet TAKE 1 TABLET BY MOUTH EVERYDAY AT BEDTIME  . ezetimibe (ZETIA) 10 MG tablet TAKE 1 TABLET BY MOUTH EVERY DAY  . finasteride (PROSCAR) 5 MG tablet Take 5 mg by mouth daily.  Marland Kitchen lisinopril (PRINIVIL,ZESTRIL) 5 MG tablet TAKE 1 TABLET BY MOUTH DAILY.  . Magnesium 500 MG CAPS Take 1 capsule by mouth daily.  . metFORMIN (GLUCOPHAGE-XR) 500 MG 24 hr tablet Take 2 tablets 2 x  /day with meals for Diabetes   No current facility-administered medications on file prior to visit.      Allergies:  Allergies  Allergen Reactions  . Atorvastatin Other (See Comments)    MYALGIAS CRAMPING  . Crestor [Rosuvastatin]     MYALGIAS CRAMPING  . Statins     MYALGIAS CRAMPING  . Tetanus Toxoids     UNSPECIFIED REACTION      Medical History:  Past Medical History:  Diagnosis Date  . Arthritis   . BPH (benign prostatic hyperplasia)   . CAD S/P percutaneous coronary angioplasty 12/2009; 08/2010   a) 6/'22 - NSTEMI: PCI to LAD: Promus Element 2.5 mm x 15 mm DES; b) Cath for Angina: Prox LAD 60-70% pre-stent with FFR 0.82, 90% RVM -- Med Rx, EF David-55%  . Essential hypertension   . Hyperlipidemia with target LDL less than 70    Statin intolerance  . Non-ST elevation MI (NSTEMI) [facility]) June 2011   PCI to LAD  . Pneumonia    DEC 2016  [place] WITH ANTIBIOTIC  .  Type 2 diabetes mellitus without complications (HCC)    Family history- Reviewed and unchanged Social history- Reviewed and unchanged   Review of Systems:  Review of Systems  Constitutional: Negative for chills, diaphoresis, fever, malaise/fatigue and weight loss.  HENT: Negative for congestion, ear discharge, ear pain, hearing loss, nosebleeds, sinus pain, sore throat and tinnitus.   Eyes: Negative for blurred vision, double vision, photophobia, pain, discharge and redness.  Respiratory: Negative for cough, hemoptysis, sputum production, shortness of breath, wheezing and stridor.   Cardiovascular: Negative for chest pain, palpitations, orthopnea, claudication, leg swelling and PND.  Gastrointestinal: Negative for abdominal pain, blood in stool, constipation, diarrhea, heartburn, melena, nausea and vomiting.  Genitourinary: Negative for dysuria, flank pain, frequency, hematuria and urgency.  Musculoskeletal: Negative for back pain, falls, joint pain, myalgias and neck pain.       Right leg pain  Skin: Negative for itching and  rash.       Right temporal, bandage.  Neurological: Negative for dizziness, tingling, tremors, sensory change, speech change, focal weakness, seizures, loss of consciousness, weakness and headaches.  Endo/Heme/Allergies: Negative for environmental allergies and polydipsia. Does not bruise/bleed easily.  Psychiatric/Behavioral: Negative for depression, hallucinations, memory loss, substance abuse and suicidal ideas. The patient is not nervous/anxious and does not have insomnia.     Physical Exam: BP 118/66   Pulse (!) 58   Temp (!) 97.2 F (36.2 C)   Wt 196 lb 12.8 oz (89.3 kg)   SpO2 98%   BMI 27.84 kg/m  Wt Readings from Last 3 Encounters:  08/13/18 196 lb 12.8 oz (89.3 kg)  05/19/18 195 lb 6.4 oz (88.6 kg)  05/13/18 195 lb 9.6 oz (88.7 kg)   General Appearance: Well nourished, in no apparent distress. Eyes: PERRLA, EOMs, conjunctiva no swelling or  erythema Sinuses: No Frontal/maxillary tenderness ENT/Mouth: Ext aud canals clear, TMs without erythema, bulging. No erythema, swelling, or exudate on post pharynx.  Tonsils not swollen or erythematous. Hearing normal.  Neck: Supple, thyroid normal.  Respiratory: Respiratory effort normal, BS equal bilaterally without rales, rhonchi, wheezing or stridor.  Cardio: RRR with no MRGs. Brisk peripheral pulses without edema.  Abdomen: Soft, + BS.  Non tender, no guarding, rebound, hernias, masses. Lymphatics: Non tender without lymphadenopathy.  Musculoskeletal: Full ROM, 5/5 strength, Normal gait Skin: Warm, dry without rashes, ecchymosis. Bandage, temporal from lesion removal. Neuro: Cranial nerves intact. No cerebellar symptoms.  Psych: Awake and oriented X 3, normal affect, Insight and Judgment appropriate.     Names of Other Physician/Practitioners you currently use: 1. [facility] here for primary care 2. Eye Exam 2019 3. Dentist 2019  Patient Care Team: [name], MD as PCP - General (Internal Medicine) Leonie Man, MD as PCP - Cardiology (Cardiology) Laurence Spates, MD as Consulting Physician (Gastroenterology) Leonie Man, MD as Consulting Physician (Cardiology) Erline Levine, MD as Consulting Physician (Neurosurgery) Carolan Clines, MD (Inactive) as Consulting Physician (Urology)    Screening Tests: Immunization History  Administered Date(s) Administered  . Influenza, High Dose Seasonal PF 06/01/2013, 04/18/2014, 05/23/2015, 05/20/2016, 04/10/2017, 05/13/2018  . Influenza-Unspecified 05/13/2011  . Pneumococcal Conjugate-13 04/18/2014  . Pneumococcal Polysaccharide-23 07/21/2002, 05/23/2015  . Td 07/21/2000    Preventative care: Last colonoscopy: 2018 no further screened necessary.   Vaccinations: TD or Tdap: 2002, Due discussed with patient, declined.  Influenza: 2019 Pneumococcal: 2016 Prevnar13:  2015 Shingles/Zostavax: Discussed with patient    Garnet Sierras, NP 8:10 AM [facility]

## 2018-08-16 ENCOUNTER — Encounter: Payer: Self-pay | Admitting: Adult Health Nurse Practitioner

## 2018-08-16 LAB — COMPLETE METABOLIC PANEL WITHOUT GFR
Albumin: 4.4 g/dL (ref 3.6–5.1)
Alkaline phosphatase (APISO): 56 U/L (ref 40–115)
Chloride: 103 mmol/L (ref 98–110)
Globulin: 2.5 g/dL (ref 1.9–3.7)
Glucose, Bld: 82 mg/dL (ref 65–99)
Potassium: 4.4 mmol/L (ref 3.5–5.3)
Sodium: 141 mmol/L (ref 135–146)
Total Bilirubin: 0.5 mg/dL (ref 0.2–1.2)

## 2018-08-16 LAB — LIPID PANEL
Cholesterol: 183 mg/dL (ref <200)
HDL: 54 mg/dL (ref >40)
LDL Cholesterol (Calc): 109 mg/dL (calc) — ABNORMAL HIGH
Non-HDL Cholesterol (Calc): 129 mg/dL (calc) (ref <130)
Total CHOL/HDL Ratio: 3.4 (calc) (ref <5.0)
Triglycerides: 108 mg/dL (ref <150)

## 2018-08-16 LAB — CBC WITH DIFFERENTIAL/PLATELET
Absolute Monocytes: 863 cells/uL (ref 200–950)
Basophils Absolute: 60 cells/uL (ref 0–200)
Basophils Relative: 0.8 %
Eosinophils Absolute: 398 cells/uL (ref 15–500)
Eosinophils Relative: 5.3 %
HCT: 47 % (ref 38.5–50.0)
Hemoglobin: 16.1 g/dL (ref 13.2–17.1)
Lymphs Abs: 1650 {cells}/uL (ref 850–3900)
MCH: 31.9 pg (ref 27.0–33.0)
MCHC: 34.3 g/dL (ref 32.0–36.0)
MCV: 93.3 fL (ref 80.0–100.0)
MPV: 11.2 fL (ref 7.5–12.5)
Monocytes Relative: 11.5 %
Neutro Abs: 4530 cells/uL (ref 1500–7800)
Neutrophils Relative %: 60.4 %
Platelets: 239 10*3/uL (ref 140–400)
RBC: 5.04 Million/uL (ref 4.20–5.80)
RDW: 12.2 % (ref 11.0–15.0)
Total Lymphocyte: 22 %
WBC: 7.5 Thousand/uL (ref 3.8–10.8)

## 2018-08-16 LAB — COMPLETE METABOLIC PANEL WITH GFR
AG Ratio: 1.8 (calc) (ref 1.0–2.5)
ALT: 13 U/L (ref 9–46)
AST: 16 U/L (ref 10–35)
BUN: 10 mg/dL (ref 7–25)
CO2: 30 mmol/L (ref 20–32)
Calcium: 10 mg/dL (ref 8.6–10.3)
Creat: 0.89 mg/dL (ref 0.70–1.11)
GFR, Est African American: 94 mL/min/{1.73_m2} (ref >=60)
GFR, Est Non African American: 81 mL/min/{1.73_m2} (ref >=60)
Total Protein: 6.9 g/dL (ref 6.1–8.1)

## 2018-08-16 LAB — HEMOGLOBIN A1C
Hgb A1c MFr Bld: 5.8 %{Hb} — ABNORMAL HIGH (ref <5.7)
Mean Plasma Glucose: 120 (calc)
eAG (mmol/L): 6.6 (calc)

## 2018-08-16 LAB — INSULIN, RANDOM: Insulin: 3.3 u[IU]/mL (ref 2.0–19.6)

## 2018-08-16 LAB — MAGNESIUM: Magnesium: 2.1 mg/dL (ref 1.5–2.5)

## 2018-08-16 LAB — PSA: PSA: 1.5 ng/mL (ref <=4.0)

## 2018-08-16 LAB — VITAMIN D 25 HYDROXY (VIT D DEFICIENCY, FRACTURES): Vit D, 25-Hydroxy: 96 ng/mL (ref 30–100)

## 2018-09-23 ENCOUNTER — Other Ambulatory Visit: Payer: Self-pay | Admitting: Physician Assistant

## 2018-09-23 DIAGNOSIS — L720 Epidermal cyst: Secondary | ICD-10-CM | POA: Diagnosis not present

## 2018-09-23 DIAGNOSIS — L723 Sebaceous cyst: Secondary | ICD-10-CM | POA: Diagnosis not present

## 2018-09-30 DIAGNOSIS — Z4802 Encounter for removal of sutures: Secondary | ICD-10-CM | POA: Diagnosis not present

## 2018-10-01 ENCOUNTER — Other Ambulatory Visit: Payer: Self-pay | Admitting: Cardiology

## 2018-10-26 ENCOUNTER — Other Ambulatory Visit: Payer: Self-pay | Admitting: Physician Assistant

## 2018-11-24 ENCOUNTER — Encounter: Payer: Self-pay | Admitting: Internal Medicine

## 2018-11-24 ENCOUNTER — Ambulatory Visit (INDEPENDENT_AMBULATORY_CARE_PROVIDER_SITE_OTHER): Payer: Medicare Other | Admitting: Internal Medicine

## 2018-11-24 ENCOUNTER — Other Ambulatory Visit: Payer: Self-pay

## 2018-11-24 VITALS — BP 116/62 | HR 64 | Temp 97.9°F | Resp 18 | Ht 70.0 in | Wt 195.0 lb

## 2018-11-24 DIAGNOSIS — Z9861 Coronary angioplasty status: Secondary | ICD-10-CM | POA: Diagnosis not present

## 2018-11-24 DIAGNOSIS — E1122 Type 2 diabetes mellitus with diabetic chronic kidney disease: Secondary | ICD-10-CM | POA: Diagnosis not present

## 2018-11-24 DIAGNOSIS — E11319 Type 2 diabetes mellitus with unspecified diabetic retinopathy without macular edema: Secondary | ICD-10-CM

## 2018-11-24 DIAGNOSIS — N401 Enlarged prostate with lower urinary tract symptoms: Secondary | ICD-10-CM | POA: Diagnosis not present

## 2018-11-24 DIAGNOSIS — Z8249 Family history of ischemic heart disease and other diseases of the circulatory system: Secondary | ICD-10-CM

## 2018-11-24 DIAGNOSIS — E119 Type 2 diabetes mellitus without complications: Secondary | ICD-10-CM | POA: Diagnosis not present

## 2018-11-24 DIAGNOSIS — Z79899 Other long term (current) drug therapy: Secondary | ICD-10-CM

## 2018-11-24 DIAGNOSIS — E782 Mixed hyperlipidemia: Secondary | ICD-10-CM

## 2018-11-24 DIAGNOSIS — N182 Chronic kidney disease, stage 2 (mild): Secondary | ICD-10-CM | POA: Diagnosis not present

## 2018-11-24 DIAGNOSIS — Z136 Encounter for screening for cardiovascular disorders: Secondary | ICD-10-CM

## 2018-11-24 DIAGNOSIS — Z125 Encounter for screening for malignant neoplasm of prostate: Secondary | ICD-10-CM | POA: Diagnosis not present

## 2018-11-24 DIAGNOSIS — Z1212 Encounter for screening for malignant neoplasm of rectum: Secondary | ICD-10-CM

## 2018-11-24 DIAGNOSIS — F17200 Nicotine dependence, unspecified, uncomplicated: Secondary | ICD-10-CM

## 2018-11-24 DIAGNOSIS — E785 Hyperlipidemia, unspecified: Secondary | ICD-10-CM | POA: Diagnosis not present

## 2018-11-24 DIAGNOSIS — I700 Atherosclerosis of aorta: Secondary | ICD-10-CM

## 2018-11-24 DIAGNOSIS — Z961 Presence of intraocular lens: Secondary | ICD-10-CM | POA: Diagnosis not present

## 2018-11-24 DIAGNOSIS — Z1211 Encounter for screening for malignant neoplasm of colon: Secondary | ICD-10-CM

## 2018-11-24 DIAGNOSIS — N138 Other obstructive and reflux uropathy: Secondary | ICD-10-CM

## 2018-11-24 DIAGNOSIS — I10 Essential (primary) hypertension: Secondary | ICD-10-CM

## 2018-11-24 DIAGNOSIS — H40013 Open angle with borderline findings, low risk, bilateral: Secondary | ICD-10-CM | POA: Diagnosis not present

## 2018-11-24 DIAGNOSIS — H35362 Drusen (degenerative) of macula, left eye: Secondary | ICD-10-CM | POA: Diagnosis not present

## 2018-11-24 DIAGNOSIS — I2510 Atherosclerotic heart disease of native coronary artery without angina pectoris: Secondary | ICD-10-CM

## 2018-11-24 DIAGNOSIS — E559 Vitamin D deficiency, unspecified: Secondary | ICD-10-CM

## 2018-11-24 LAB — HM DIABETES EYE EXAM

## 2018-11-24 NOTE — Progress Notes (Signed)
[facility]   David [name], M.D.     David [name]. David [name], P.A.-C David [name] Telephone [phone] Telefax [phone] Annual  Screening/Preventative Visit  & Comprehensive Evaluation & Examination  History of Present Illness:     This very nice 83 y.o. MWM presents for a Screening /Preventative Visit & comprehensive evaluation and management of multiple medical co-morbidities.  Patient has been followed for HTN, ASCAD, HLD, T2_NIDDM  and Vitamin D Deficiency.     Patient has had back surg x 3 for synovial cystectomies  (x 2 in 2017 & 3rd in 09/2017) by David David [name]. He feels that he has been physically limited since his last neck surgery.      HTN predates since 2000. Patient's BP has been controlled at home.  Today's BP  Is at goal - 116/62.  In 2011, he had NSTEMI with PCA/DES to the LAD & has done well since wrt his heart. Currently he is followed by David [name].  Patient denies any cardiac symptoms as chest pain, palpitations, shortness of breath, dizziness or ankle swelling.     Patient's hyperlipidemia is controlled with diet and medications. Patient denies myalgias or other medication SE's. Last lipids were not at goal: Lab Results  Component Value Date   CHOL 183 08/13/2018   HDL 54 08/13/2018   LDLCALC 109 (H) 08/13/2018   TRIG 108 08/13/2018   CHOLHDL 3.4 08/13/2018      Patient has hx/o T2_NIDDM since 2006 and has hx/o CKD2 (GFR 85). Patient is on Metformin with good control.    and patient denies reactive hypoglycemic symptoms, visual blurring, diabetic polys or paresthesias. Last A1c was at goal: Lab Results  Component Value Date   HGBA1C 5.8 (H) 08/13/2018       Finally, patient has history of Vitamin D Deficiency ("22" / 2008) and last vitamin D was at goal: Lab Results  Component Value Date   VD25OH 96  08/13/2018   Current Outpatient Medications on File Prior to Visit  Medication Sig  . aspirin EC 81 MG tablet Take 1 tablet (81 mg total) by mouth every other day.  Marland Kitchen CINNAMON PO Take 1,000 mg by mouth daily.   . clopidogrel (PLAVIX) 75 MG tablet TAKE 1 TABLET BY MOUTH EVERY DAY  . doxazosin (CARDURA) 4 MG tablet TAKE 1 TABLET BY MOUTH EVERYDAY AT BEDTIME  . ezetimibe (ZETIA) 10 MG tablet TAKE 1 TABLET BY MOUTH EVERY DAY  . finasteride (PROSCAR) 5 MG tablet Take 5 mg by mouth daily.  Marland Kitchen lisinopril (PRINIVIL,ZESTRIL) 5 MG tablet TAKE 1 TABLET BY MOUTH DAILY.  . Magnesium 500 MG CAPS Take 1 capsule by mouth daily.  . metFORMIN (GLUCOPHAGE-XR) 500 MG 24 hr tablet Take 2 tablets 2 x  /day with meals for Diabetes  . VITAMIN D PO Take 10,000 Units by mouth daily.   No current facility-administered medications on file prior to visit.    Allergies  Allergen Reactions  . Atorvastatin Other (See Comments)    MYALGIAS CRAMPING  . Crestor [Rosuvastatin]     MYALGIAS CRAMPING  . Statins     MYALGIAS CRAMPING  . Tetanus Toxoids     UNSPECIFIED REACTION    Past Medical History:  Diagnosis Date  .  Arthritis   . BPH (benign prostatic hyperplasia)   . CAD S/P percutaneous coronary angioplasty 12/2009; 08/2010   a) 6/'22 - NSTEMI: PCI to LAD: Promus Element 2.5 mm x 15 mm DES; b) Cath for Angina: Prox LAD 60-70% pre-stent with FFR 0.82, 90% RVM -- Med Rx, EF 50-55%  . Essential hypertension   . Hyperlipidemia with target LDL less than 70    Statin intolerance  . Non-ST elevation MI (NSTEMI) [facility]) June 2011   PCI to LAD  . Pneumonia    DEC 2016  [place] WITH ANTIBIOTIC  . Type 2 diabetes mellitus without complications [facility])    Health Maintenance  Topic Date Due  . OPHTHALMOLOGY EXAM  11/03/2018  . TETANUS/TDAP  06/27/2020 (Originally 07/21/2010)  . FOOT EXAM  02/04/2019  . HEMOGLOBIN A1C  02/11/2019  . INFLUENZA VACCINE  02/19/2019  . PNA vac Low Risk Adult  Completed   Immunization History   Administered Date(s) Administered  . Influenza, High Dose Seasonal PF 06/01/2013, 04/18/2014, 05/23/2015, 05/20/2016, 04/10/2017, 05/13/2018  . Influenza-Unspecified 05/13/2011  . Pneumococcal Conjugate-13 04/18/2014  . Pneumococcal Polysaccharide-23 07/21/2002, 05/23/2015  . Td 07/21/2000   Last Colon - 06/23/2017 - David David [name] - recc no F/U due to age  Past Surgical History:  Procedure Laterality Date  . CARDIAC CATHETERIZATION  12/2009   Proximal LAD stenosis followed by a significant 80-90% distal stenosis  . CARDIAC CATHETERIZATION  February 2012    90% ostial RV marginal branch; 60-70% proximal LAD with widely patent distal stent. FFR 0.82; medical therapy  . CATARACT EXTRACTION W/ INTRAOCULAR LENS  IMPLANT, BILATERAL    . CORONARY ANGIOPLASTY  12/2009   PTCA to proximal LAD; PCI with Promus Element DES stent  2.5 mm x 15 mm  distalLAD - .for non-ST elevation MI  . DENTAL SURGERY    . LUMBAR LAMINECTOMY/DECOMPRESSION MICRODISCECTOMY N/A 08/09/2015   Procedure: Lumbar Four-Five, Lumbar Five- Sacral One Decompressive Lumbar Laminectomy with Resection of Synovial Cyst;  Surgeon: David Levine, MD;  Location: [place] [facility]UR[facility] ORS;  Service: Neurosurgery;  Laterality: N/A;  L4 to S1 Decompressive Lumbar Laminectomy  . LUMBAR LAMINECTOMY/DECOMPRESSION MICRODISCECTOMY Right 01/21/2016   Procedure: Redo Right Lumbar Five-Sacral One Laminectomy for synovial cyst;  Surgeon: David Levine, MD;  Location: [place] NEURO ORS;  Service: Neurosurgery;  Laterality: Right;  right  . LUMBAR LAMINECTOMY/DECOMPRESSION MICRODISCECTOMY Right 10/08/2017   Procedure: Right Lumbar three-four Laminectomy for synovial cyst;  Surgeon: David Levine, MD;  Location: [place];  Service: Neurosurgery;  Laterality: Right;  . NASAL FRACTURE SURGERY    . TRANSTHORACIC ECHOCARDIOGRAM  June 2011   - (EF not reported) Moderately dilated LV; moderate hypokinesis of anteroseptal and anterior wall consistent with MI. Grade 1 diastolic dysfunction.  Mild to moderately dilated LA  . VASECTOMY     Family History  Problem Relation Age of Onset  . Cancer Father        lung  . Stroke Father   . Cancer Sister        breast  . Cancer Brother        lung  . Cancer Son        history of prostate   Social History   Socioeconomic History  . Marital status: Married    Spouse name: Kermit Balo  . Number of children: Not on file  Occupational History  . Retired  Mudlogger - [employer]  Tobacco Use  . Smoking status: Current Every Day Smoker    Years: 60.00  Types: Pipe  . Smokeless tobacco: Never Used  . Tobacco comment: smokes pipe qd  Substance and Sexual Activity  . Alcohol use: Yes    Alcohol/week: 7.0 standard drinks    Types: 7 Standard drinks or equivalent per week    Comment: drinks 4 ounces of vodka daily  . Drug use: No  . Sexual activity: Not on file  Social History Narrative   He is a married father of 2 with no grandchildren as of yet. He continued to smoke a pipe several times the course of day. He does have an occasional alcoholic beverage. While not involved a standard exercise routine, he is very active with walking and working in the yard, splitting wood and doing aggressive yard work including Production designer, theatre/television/film.    ROS Constitutional: Denies fever, chills, weight loss/gain, headaches, insomnia,  night sweats or change in appetite. Does c/o fatigue. Eyes: Denies redness, blurred vision, diplopia, discharge, itchy or watery eyes.  ENT: Denies discharge, congestion, post nasal drip, epistaxis, sore throat, earache, hearing loss, dental pain, Tinnitus, Vertigo, Sinus pain or snoring.  Cardio: Denies chest pain, palpitations, irregular heartbeat, syncope, dyspnea, diaphoresis, orthopnea, PND, claudication or edema Respiratory: denies cough, dyspnea, DOE, pleurisy, hoarseness, laryngitis or wheezing.  Gastrointestinal: Denies dysphagia, heartburn, reflux, water brash, pain, cramps, nausea,  vomiting, bloating, diarrhea, constipation, hematemesis, melena, hematochezia, jaundice or hemorrhoids Genitourinary: Denies dysuria, frequency, discharge, hematuria or flank pain. Has urgency, nocturia x 2-3 & occasional hesitancy. Musculoskeletal: Denies arthralgia, myalgia, stiffness, Jt. Swelling, pain, limp or strain/sprain. Denies Falls. Skin: Denies puritis, rash, hives, warts, acne, eczema or change in skin lesion Neuro: No weakness, tremor, incoordination, spasms, paresthesia or pain Psychiatric: Denies confusion, memory loss or sensory loss. Denies Depression. Endocrine: Denies change in weight, skin, hair change, nocturia, and paresthesia, diabetic polys, visual blurring or hyper / hypo glycemic episodes.  Heme/Lymph: No excessive bleeding, bruising or enlarged lymph nodes.  Physical Exam  BP 116/62   Pulse 64   Temp 97.9 F (36.6 C)   Resp 18   Ht 5\' 10"  (1.778 m)   Wt 195 lb (88.5 kg)   BMI 27.98 kg/m   General Appearance: Well nourished and well groomed and in no apparent distress.  Eyes: PERRLA, EOMs, conjunctiva no swelling or erythema, normal fundi and vessels. Sinuses: No frontal/maxillary tenderness ENT/Mouth: EACs patent / TMs  nl. Nares clear without erythema, swelling, mucoid exudates. Oral hygiene is good. No erythema, swelling, or exudate. Tongue normal, non-obstructing. Tonsils not swollen or erythematous. Hearing normal.  Neck: Supple, thyroid not palpable. No bruits, nodes or JVD. Respiratory: Respiratory effort normal.  BS equal and clear bilateral without rales, rhonci, wheezing or stridor. Cardio: Heart sounds are normal with regular rate and rhythm and no murmurs, rubs or gallops. Peripheral pulses are normal and equal bilaterally without edema. No aortic or femoral bruits. Chest: symmetric with normal excursions and percussion.  Abdomen: Soft, with Nl bowel sounds. Nontender, no guarding, rebound, hernias, masses, or organomegaly.  Lymphatics: Non tender  without lymphadenopathy.  Musculoskeletal: Full ROM all peripheral extremities, joint stability, 5/5 strength, and normal gait. Skin: Warm and dry without rashes, lesions, cyanosis, clubbing or  ecchymosis.  Neuro: Cranial nerves intact, reflexes equal bilaterally. Normal muscle tone, no cerebellar symptoms. Sensation intact to touch, vibratory and Monofilament to the toes bilaterally. Pysch: Alert and oriented X 3 with normal affect, insight and judgment appropriate.   Assessment and Plan  1. Essential hypertension  - EKG 12-Lead - [place],  RETROPERITNL ABD,  LTD - Urinalysis, Routine w reflex microscopic - Microalbumin / creatinine urine ratio - CBC with Differential/Platelet - COMPLETE METABOLIC PANEL WITH GFR - Magnesium - TSH  2. Hyperlipidemia, mixed  - EKG 12-Lead - [place], RETROPERITNL ABD,  LTD - Lipid panel - TSH  3. Type 2 diabetes mellitus with stage 2 chronic kidney disease, without long-term current use of insulin (HCC)  - EKG 12-Lead - [place], RETROPERITNL ABD,  LTD - HM DIABETES FOOT EXAM - LOW EXTREMITY NEUR EXAM DOCUM - Hemoglobin A1c - Insulin, random  4. Vitamin D deficiency  - VITAMIN D 25 Hydroxyl  5. 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 CAD S/P percutaneous coronary angioplasty and PCI- DES mid LAD in the setting of non-STEMI  - EKG 12-Lead - Lipid panel  7. Hyperlipidemia with statin intolerance; goal LDL < 70  - Lipid panel  8. Screening for ischemic heart disease  - EKG 12-Lead  9. Family history of cerebrovascular event  - EKG 12-Lead - [place], RETROPERITNL ABD,  LTD  10. Smoker  - EKG 12-Lead - [place], RETROPERITNL ABD,  LTD  11. Aortic atherosclerosis (HCC)  - [place], RETROPERITNL ABD,  LTD - Lipid panel  12. Screening for AAA (aortic abdominal aneurysm)  - [place], RETROPERITNL ABD,   LTD  13. BPH with obstruction/lower urinary tract symptoms  - PSA  14. Prostate cancer screening  - PSA  15. Screening for colorectal cancer  - POC Hemoccult Bld/Stl  16. Medication management  - Urinalysis, Routine w reflex microscopic - Microalbumin / creatinine urine ratio - CBC with Differential/Platelet - COMPLETE METABOLIC PANEL WITH GFR - Magnesium - Lipid panel - TSH - Hemoglobin A1c - Insulin, random - VITAMIN D 25 Hydroxyl       Patient was counseled in prudent diet, weight control to achieve/maintain BMI less than 25, BP monitoring, regular exercise and medications as discussed.  Discussed med effects and SE's. Routine screening labs and tests as requested with regular follow-up as recommended. I discussed the assessment and treatment plan as above with the patient. The patient was provided an opportunity to ask questions and all were answered. The patient agreed with the plan and demonstrated an understanding of the instructions.Over 40 minutes of exam, counseling, chart review and high complex critical decision making was performed .  I provided over 40 minutes of exam, counseling, chart review and  complex critical decision making.  Kirtland Bouchard, MD

## 2018-11-24 NOTE — Patient Instructions (Signed)
Coronavirus (COVID-19) Are you at risk?  Are you at risk for the Coronavirus (COVID-19)?  To be considered HIGH RISK for Coronavirus (COVID-19), you have to meet the following criteria:  . Traveled to [place], [place], [place], [place] or [place]; or in the [place] to [place], [place], [place]  . or [place]; and have fever, cough, and shortness of breath within the last 2 weeks of travel OR . Been in close contact with a person diagnosed with COVID-19 within the last 2 weeks and have  . fever, cough,and shortness of breath .  . IF YOU DO NOT MEET THESE CRITERIA, YOU ARE CONSIDERED LOW RISK FOR COVID-19.  What to do if you are HIGH RISK for COVID-19?  . If you are having a medical emergency, call 911. . Seek medical care right away. Before you go to a doctor's office, urgent care or emergency department, .  call ahead and tell them about your recent travel, contact with someone diagnosed with COVID-19  .  and your symptoms.  . You should receive instructions from your physician's office regarding next steps of care.  . When you arrive at healthcare provider, tell the healthcare staff immediately you have returned from  . visiting [place], [place], [place], [place] or [place]; or traveled in the [place] to [place], [place],  . [place] or [place] in the last two weeks or you have been in close contact with a person diagnosed with  . COVID-19 in the last 2 weeks.   . Tell the health care staff about your symptoms: fever, cough and shortness of breath. . After you have been seen by a medical provider, you will be either: o Tested for (COVID-19) and discharged home on quarantine except to seek medical care if  o symptoms worsen, and asked to  - Stay home and avoid contact with others until you get your results (4-5 days)  - Avoid travel on public transportation if possible (such as bus, train, or airplane) or o Sent to the Emergency Department by EMS for evaluation,  COVID-19 testing  and  o possible admission depending on your condition and test results.  What to do if you are LOW RISK for COVID-19?  Reduce your risk of any infection by using the same precautions used for avoiding the common cold or flu:  . Wash your hands often with soap and warm water for at least 20 seconds.  If soap and water are not readily available,  . use an alcohol-based hand sanitizer with at least 60% alcohol.  . If coughing or sneezing, cover your mouth and nose by coughing or sneezing into the elbow areas of your shirt or coat, .  into a tissue or into your sleeve (not your hands). . Avoid shaking hands with others and consider head nods or verbal greetings only. . Avoid touching your eyes, nose, or mouth with unwashed hands.  . Avoid close contact with people who are sick. . Avoid places or events with large numbers of people in one location, like concerts or sporting events. . Carefully consider travel plans you have or are making. . If you are planning any travel outside or inside the [place], visit the CDC's Travelers' Health webpage for the latest health notices. . If you have some symptoms but not all symptoms, continue to monitor at home and seek medical attention  . if your symptoms worsen. . If you are having a medical emergency, call 911. >>>>>>>>>>>>>>>>>>>>>>>>>>>>>>>>>>>>>>>>>>>>>>>>>>>>>>>   We Do NOT Approve of  [facility] [place] Soliciting Our Patients  To Do Home Visits  & We Do NOT Approve of [employer] > > > > > > > > > > > > > > > > > > > > > > > > > > > > > > > > > > >  > > > >   Preventive Care for Adults  A healthy lifestyle and preventive care can promote health and wellness. Preventive health guidelines for men include the following key practices:  A routine yearly physical is a good way to check with your health care provider about your health and preventative screening. It is a chance to share any concerns and updates on  your health and to receive a thorough exam.  Visit your dentist for a routine exam and preventative care every 6 months. Brush your teeth twice a day and floss once a day. Good oral hygiene prevents tooth decay and gum disease.  The frequency of eye exams is based on your age, health, family medical history, use of contact lenses, and other factors. Follow your health care provider's recommendations for frequency of eye exams.  Eat a healthy diet. Foods such as vegetables, fruits, whole grains, low-fat dairy products, and lean protein foods contain the nutrients you need without too many calories. Decrease your intake of foods high in solid fats, added sugars, and salt. Eat the right amount of calories for you. Get information about a proper diet from your health care provider, if necessary.  Regular physical exercise is one of the most important things you can do for your health. Most adults should get at least 150 minutes of moderate-intensity exercise (any activity that increases your heart rate and causes you to sweat) each week. In addition, most adults need muscle-strengthening exercises on 2 or more days a week.  Maintain a healthy weight. The body mass index (BMI) is a screening tool to identify possible weight problems. It provides an estimate of body fat based on height and weight. Your health care provider can find your BMI and can help you achieve or maintain a healthy weight. For adults 20 years and older:  A BMI below 18.5 is considered underweight.  A BMI of 18.5 to 24.9 is normal.  A BMI of 25 to 29.9 is considered overweight.  A BMI of 30 and above is considered obese.  Maintain normal blood lipids and cholesterol levels by exercising and minimizing your intake of saturated fat. Eat a balanced diet with plenty of fruit and vegetables. Blood tests for lipids and cholesterol should begin at age 20 and be repeated every 5 years. If your lipid or cholesterol levels are high, you are  over 50, or you are at high risk for heart disease, you may need your cholesterol levels checked more frequently. Ongoing high lipid and cholesterol levels should be treated with medicines if diet and exercise are not working.  If you smoke, find out from your health care provider how to quit. If you do not use tobacco, do not start.  Lung cancer screening is recommended for adults aged 55-80 years who are at high risk for developing lung cancer because of a history of smoking. A yearly low-dose CT scan of the lungs is recommended for people who have at least a 30-pack-year history of smoking and are a current smoker or have quit within the past 15 years. A pack year of smoking is smoking an average of 1   pack of cigarettes a day for 1 year (for example: 1 pack a day for 30 years or 2 packs a day for 15 years). Yearly screening should continue until the smoker has stopped smoking for at least 15 years. Yearly screening should be stopped for people who develop a health problem that would prevent them from having lung cancer treatment.  If you choose to drink alcohol, do not have more than 2 drinks per day. One drink is considered to be 12 ounces (355 mL) of beer, 5 ounces (148 mL) of wine, or 1.5 ounces (44 mL) of liquor.  Avoid use of street drugs. Do not share needles with anyone. Ask for help if you need support or instructions about stopping the use of drugs.  High blood pressure causes heart disease and increases the risk of stroke. Your blood pressure should be checked at least every 1-2 years. Ongoing high blood pressure should be treated with medicines, if weight loss and exercise are not effective.  If you are 45-79 years old, ask your health care provider if you should take aspirin to prevent heart disease.  Diabetes screening involves taking a blood sample to check your fasting blood sugar level. Testing should be considered at a younger age or be carried out more frequently if you are  overweight and have at least 1 risk factor for diabetes.  Colorectal cancer can be detected and often prevented. Most routine colorectal cancer screening begins at the age of 50 and continues through age 75. However, your health care provider may recommend screening at an earlier age if you have risk factors for colon cancer. On a yearly basis, your health care provider may provide home test kits to check for hidden blood in the stool. Use of a small camera at the end of a tube to directly examine the colon (sigmoidoscopy or colonoscopy) can detect the earliest forms of colorectal cancer. Talk to your health care provider about this at age 50, when routine screening begins. Direct exam of the colon should be repeated every 5-10 years through age 75, unless early forms of precancerous polyps or small growths are found.  Hepatitis C blood testing is recommended for all people born from 1945 through 1965 and any individual with known risks for hepatitis C.  Screening for abdominal aortic aneurysm (AAA)  by ultrasound is recommended for people who have history of high blood pressure or who are current or former smokers.  Healthy men should  receive prostate-specific antigen (PSA) blood tests as part of routine cancer screening. Talk with your health care provider about prostate cancer screening.  Testicular cancer screening is  recommended for adult males. Screening includes self-exam, a health care provider exam, and other screening tests. Consult with your health care provider about any symptoms you have or any concerns you have about testicular cancer.  Use sunscreen. Apply sunscreen liberally and repeatedly throughout the day. You should seek shade when your shadow is shorter than you. Protect yourself by wearing long sleeves, pants, a wide-brimmed hat, and sunglasses year round, whenever you are outdoors.  Once a month, do a whole-body skin exam, using a mirror to look at the skin on your back. Tell  your health care provider about new moles, moles that have irregular borders, moles that are larger than a pencil eraser, or moles that have changed in shape or color.  Stay current with required vaccines (immunizations).  Influenza vaccine. All adults should be immunized every year.  Tetanus, diphtheria, and acellular   pertussis (Td, Tdap) vaccine. An adult who has not previously received Tdap or who does not know his vaccine status should receive 1 dose of Tdap. This initial dose should be followed by tetanus and diphtheria toxoids (Td) booster doses every 10 years. Adults with an unknown or incomplete history of completing a 3-dose immunization series with Td-containing vaccines should begin or complete a primary immunization series including a Tdap dose. Adults should receive a Td booster every 10 years.  Zoster vaccine. One dose is recommended for adults aged 60 years or older unless certain conditions are present.    PREVNAR - Pneumococcal 13-valent conjugate (PCV13) vaccine. When indicated, a person who is uncertain of his immunization history and has no record of immunization should receive the PCV13 vaccine. An adult aged 19 years or older who has certain medical conditions and has not been previously immunized should receive 1 dose of PCV13 vaccine. This PCV13 should be followed with a dose of pneumococcal polysaccharide (PPSV23) vaccine. The PPSV23 vaccine dose should be obtained 1 or more year(s)after the dose of PCV13 vaccine. An adult aged 19 years or older who has certain medical conditions and previously received 1 or more doses of PPSV23 vaccine should receive 1 dose of PCV13. The PCV13 vaccine dose should be obtained 1 or more years after the last PPSV23 vaccine dose.    PNEUMOVAX - Pneumococcal polysaccharide (PPSV23) vaccine. When PCV13 is also indicated, PCV13 should be obtained first. All adults aged 65 years and older should be immunized. An adult younger than age 65 years who  has certain medical conditions should be immunized. Any person who resides in a nursing home or long-term care facility should be immunized. An adult smoker should be immunized. People with an immunocompromised condition and certain other conditions should receive both PCV13 and PPSV23 vaccines. People with human immunodeficiency virus (HIV) infection should be immunized as soon as possible after diagnosis. Immunization during chemotherapy or radiation therapy should be avoided. Routine use of PPSV23 vaccine is not recommended for American Indians, [place] Natives, or people younger than 65 years unless there are medical conditions that require PPSV23 vaccine. When indicated, people who have unknown immunization and have no record of immunization should receive PPSV23 vaccine. One-time revaccination 5 years after the first dose of PPSV23 is recommended for people aged 19-64 years who have chronic kidney failure, nephrotic syndrome, asplenia, or immunocompromised conditions. People who received 1-2 doses of PPSV23 before age 65 years should receive another dose of PPSV23 vaccine at age 65 years or later if at least 5 years have passed since the previous dose. Doses of PPSV23 are not needed for people immunized with PPSV23 at or after age 65 years.    Hepatitis A vaccine. Adults who wish to be protected from this disease, have certain high-risk conditions, work with hepatitis A-infected animals, work in hepatitis A research labs, or travel to or work in countries with a high rate of hepatitis A should be immunized. Adults who were previously unvaccinated and who anticipate close contact with an international adoptee during the first 60 days after arrival in the [place] from a country with a high rate of hepatitis A should be immunized.    Hepatitis B vaccine. Adults should be immunized if they wish to be protected from this disease, have certain high-risk conditions, may be exposed to blood or other  infectious body fluids, are household contacts or sex partners of hepatitis B positive people, are clients or workers in certain care facilities,   or travel to or work in countries with a high rate of hepatitis B.   Preventive Service / Frequency   Ages 65 and over  Blood pressure check.  Lipid and cholesterol check.  Lung cancer screening. / Every year if you are aged 55-80 years and have a 30-pack-year history of smoking and currently smoke or have quit within the past 15 years. Yearly screening is stopped once you have quit smoking for at least 15 years or develop a health problem that would prevent you from having lung cancer treatment.  Fecal occult blood test (FOBT) of stool. You may not have to do this test if you get a colonoscopy every 10 years.  Flexible sigmoidoscopy** or colonoscopy.** / Every 5 years for a flexible sigmoidoscopy or every 10 years for a colonoscopy beginning at age 50 and continuing until age 75.  Hepatitis C blood test.** / For all people born from 1945 through 1965 and any individual with known risks for hepatitis C.  Abdominal aortic aneurysm (AAA) screening./ Screening current or former smokers or have Hypertension.  Skin self-exam. / Monthly.  Influenza vaccine. / Every year.  Tetanus, diphtheria, and acellular pertussis (Tdap/Td) vaccine.** / 1 dose of Td every 10 years.   Zoster vaccine.** / 1 dose for adults aged 60 years or older.         Pneumococcal 13-valent conjugate (PCV13) vaccine.    Pneumococcal polysaccharide (PPSV23) vaccine.     Hepatitis A vaccine.** / Consult your health care provider.  Hepatitis B vaccine.** / Consult your health care provider. Screening for abdominal aortic aneurysm (AAA)  by ultrasound is recommended for people who have history of high blood pressure or who are current or former smokers. ++++++++++ Recommend Adult Low Dose Aspirin or  coated  Aspirin 81 mg daily  To reduce risk of Colon Cancer 20 %,   Skin Cancer 26 % ,  Malignant Melanoma 46%  and  Pancreatic cancer 60% ++++++++++++++++++++++ Vitamin D goal  is between 70-100.  Please make sure that you are taking your Vitamin D as directed.  It is very important as a natural anti-inflammatory  helping hair, skin, and nails, as well as reducing stroke and heart attack risk.  It helps your bones and helps with mood. It also decreases numerous cancer risks so please take it as directed.  Low Vit D is associated with a 200-300% higher risk for CANCER  and 200-300% higher risk for HEART   ATTACK  &  STROKE.   ...................................... It is also associated with higher death rate at younger ages,  autoimmune diseases like Rheumatoid arthritis, Lupus, Multiple Sclerosis.    Also many other serious conditions, like depression, Alzheimer's Dementia, infertility, muscle aches, fatigue, fibromyalgia - just to name a few. ++++++++++++++++++++++ Recommend the book "The END of DIETING" by Dr Joel Fuhrman  & the book "The END of DIABETES " by Dr Joel Fuhrman At [employer] - get book & Audio CD's    Being diabetic has a  300% increased risk for heart attack, stroke, cancer, and alzheimer- type vascular dementia. It is very important that you work harder with diet by avoiding all foods that are white. Avoid white rice (brown & wild rice is OK), white potatoes (sweetpotatoes in moderation is OK), White bread or wheat bread or anything made out of white flour like bagels, donuts, rolls, buns, biscuits, cakes, pastries, cookies, pizza crust, and pasta (made from white flour & egg whites) - vegetarian pasta or spinach or wheat   pasta is OK. Multigrain breads like Arnold's or [employer], or multigrain sandwich thins or flatbreads.  Diet, exercise and weight loss can reverse and cure diabetes in the early stages.  Diet, exercise and weight loss is very important in the control and prevention of complications of diabetes which affects every  system in your body, ie. Brain - dementia/stroke, eyes - glaucoma/blindness, heart - heart attack/heart failure, kidneys - dialysis, stomach - gastric paralysis, intestines - malabsorption, nerves - severe painful neuritis, circulation - gangrene & loss of a leg(s), and finally cancer and Alzheimers.    I recommend avoid fried & greasy foods,  sweets/candy, white rice (brown or wild rice or Quinoa is OK), white potatoes (sweet potatoes are OK) - anything made from white flour - bagels, doughnuts, rolls, buns, biscuits,white and wheat breads, pizza crust and traditional pasta made of white flour & egg white(vegetarian pasta or spinach or wheat pasta is OK).  Multi-grain bread is OK - like multi-grain flat bread or sandwich thins. Avoid alcohol in excess. Exercise is also important.    Eat all the vegetables you want - avoid meat, especially red meat and dairy - especially cheese.  Cheese is the most concentrated form of trans-fats which is the worst thing to clog up our arteries. Veggie cheese is OK which can be found in the fresh produce section at [employer] or [employer] or [employer]  ++++++++++++++++++++++ DASH Eating Plan  DASH stands for "Dietary Approaches to Stop Hypertension."   The DASH eating plan is a healthy eating plan that has been shown to reduce high blood pressure (hypertension). Additional health benefits may include reducing the risk of type 2 diabetes mellitus, heart disease, and stroke. The DASH eating plan may also help with weight loss. WHAT DO I NEED TO KNOW ABOUT THE DASH EATING PLAN? For the DASH eating plan, you will follow these general guidelines:  Choose foods with a percent daily value for sodium of less than 5% (as listed on the food label).  Use salt-free seasonings or herbs instead of table salt or sea salt.  Check with your health care provider or pharmacist before using salt substitutes.  Eat lower-sodium products, often labeled as "lower sodium" or "no  salt added."  Eat fresh foods.  Eat more vegetables, fruits, and low-fat dairy products.  Choose whole grains. Look for the word "whole" as the first word in the ingredient list.  Choose fish   Limit sweets, desserts, sugars, and sugary drinks.  Choose heart-healthy fats.  Eat veggie cheese   Eat more home-cooked food and less restaurant, buffet, and fast food.  Limit fried foods.  Cook foods using methods other than frying.  Limit canned vegetables. If you do use them, rinse them well to decrease the sodium.  When eating at a restaurant, ask that your food be prepared with less salt, or no salt if possible.                      WHAT FOODS CAN I EAT? Read Dr Joel Fuhrman's books on The End of Dieting & The End of Diabetes  Grains Whole grain or whole wheat bread. Brown rice. Whole grain or whole wheat pasta. Quinoa, bulgur, and whole grain cereals. Low-sodium cereals. Corn or whole wheat flour tortillas. Whole grain cornbread. Whole grain crackers. Low-sodium crackers.  Vegetables Fresh or frozen vegetables (raw, steamed, roasted, or grilled). Low-sodium or reduced-sodium tomato and vegetable juices. Low-sodium or reduced-sodium tomato sauce and paste. Low-sodium   or reduced-sodium canned vegetables.   Fruits All fresh, canned (in natural juice), or frozen fruits.  Protein Products  All fish and seafood.  Dried beans, peas, or lentils. Unsalted nuts and seeds. Unsalted canned beans.  Dairy Low-fat dairy products, such as skim or 1% milk, 2% or reduced-fat cheeses, low-fat ricotta or cottage cheese, or plain low-fat yogurt. Low-sodium or reduced-sodium cheeses.  Fats and Oils Tub margarines without trans fats. Light or reduced-fat mayonnaise and salad dressings (reduced sodium). Avocado. Safflower, olive, or canola oils. Natural peanut or almond butter.  Other Unsalted popcorn and pretzels. The items listed above may not be a complete list of recommended foods or  beverages. Contact your dietitian for more options.  ++++++++++++++++++++  WHAT FOODS ARE NOT RECOMMENDED? Grains/ White flour or wheat flour White bread. White pasta. White rice. Refined cornbread. Bagels and croissants. Crackers that contain trans fat.  Vegetables  Creamed or fried vegetables. Vegetables in a . Regular canned vegetables. Regular canned tomato sauce and paste. Regular tomato and vegetable juices.  Fruits Dried fruits. Canned fruit in light or heavy syrup. Fruit juice.  Meat and Other Protein Products Meat in general - RED meat & White meat.  Fatty cuts of meat. Ribs, chicken wings, all processed meats as bacon, sausage, bologna, salami, fatback, hot dogs, bratwurst and packaged luncheon meats.  Dairy Whole or 2% milk, cream, half-and-half, and cream cheese. Whole-fat or sweetened yogurt. Full-fat cheeses or blue cheese. Non-dairy creamers and whipped toppings. Processed cheese, cheese spreads, or cheese curds.  Condiments Onion and garlic salt, seasoned salt, table salt, and sea salt. Canned and packaged gravies. Worcestershire sauce. Tartar sauce. Barbecue sauce. Teriyaki sauce. Soy sauce, including reduced sodium. Steak sauce. Fish sauce. Oyster sauce. Cocktail sauce. Horseradish. Ketchup and mustard. Meat flavorings and tenderizers. Bouillon cubes. Hot sauce. Tabasco sauce. Marinades. Taco seasonings. Relishes.  Fats and Oils Butter, stick margarine, lard, shortening and bacon fat. Coconut, palm kernel, or palm oils. Regular salad dressings.  Pickles and olives. Salted popcorn and pretzels.  The items listed above may not be a complete list of foods and beverages to avoid.    

## 2018-11-25 LAB — PSA: PSA: 1.1 ng/mL (ref <=4.0)

## 2018-11-25 LAB — URINALYSIS, ROUTINE W REFLEX MICROSCOPIC
Bilirubin Urine: NEGATIVE
Glucose, UA: NEGATIVE
Hgb urine dipstick: NEGATIVE
Ketones, ur: NEGATIVE
Leukocytes,Ua: NEGATIVE
Nitrite: NEGATIVE
Protein, ur: NEGATIVE
Specific Gravity, Urine: 1.012 (ref 1.001–1.03)
pH: 5.5 (ref 5.0–8.0)

## 2018-11-25 LAB — LIPID PANEL
Cholesterol: 168 mg/dL (ref <200)
HDL: 53 mg/dL (ref >=40)
LDL Cholesterol (Calc): 93 mg/dL (calc)
Non-HDL Cholesterol (Calc): 115 mg/dL (calc) (ref <130)
Total CHOL/HDL Ratio: 3.2 (calc) (ref <5.0)
Triglycerides: 121 mg/dL (ref <150)

## 2018-11-25 LAB — COMPLETE METABOLIC PANEL WITH GFR
AG Ratio: 1.8 (calc) (ref 1.0–2.5)
ALT: 15 U/L (ref 9–46)
AST: 18 U/L (ref 10–35)
Albumin: 4.2 g/dL (ref 3.6–5.1)
Alkaline phosphatase (APISO): 54 U/L (ref 35–144)
BUN: 15 mg/dL (ref 7–25)
CO2: 26 mmol/L (ref 20–32)
Calcium: 9.7 mg/dL (ref 8.6–10.3)
Chloride: 104 mmol/L (ref 98–110)
Creat: 0.85 mg/dL (ref 0.70–1.11)
GFR, Est African American: 95 mL/min/{1.73_m2} (ref >=60)
GFR, Est Non African American: 82 mL/min/{1.73_m2} (ref >=60)
Globulin: 2.3 g/dL (calc) (ref 1.9–3.7)
Glucose, Bld: 80 mg/dL (ref 65–99)
Potassium: 4.4 mmol/L (ref 3.5–5.3)
Sodium: 137 mmol/L (ref 135–146)
Total Bilirubin: 0.6 mg/dL (ref 0.2–1.2)
Total Protein: 6.5 g/dL (ref 6.1–8.1)

## 2018-11-25 LAB — CBC WITH DIFFERENTIAL/PLATELET
Absolute Monocytes: 861 cells/uL (ref 200–950)
Basophils Absolute: 58 cells/uL (ref 0–200)
Basophils Relative: 0.8 %
Eosinophils Absolute: 277 cells/uL (ref 15–500)
Eosinophils Relative: 3.8 %
HCT: 45.3 % (ref 38.5–50.0)
Hemoglobin: 15.3 g/dL (ref 13.2–17.1)
Lymphs Abs: 1548 cells/uL (ref 850–3900)
MCH: 31.7 pg (ref 27.0–33.0)
MCHC: 33.8 g/dL (ref 32.0–36.0)
MCV: 93.8 fL (ref 80.0–100.0)
MPV: 10.7 fL (ref 7.5–12.5)
Monocytes Relative: 11.8 %
Neutro Abs: 4555 cells/uL (ref 1500–7800)
Neutrophils Relative %: 62.4 %
Platelets: 253 10*3/uL (ref 140–400)
RBC: 4.83 10*6/uL (ref 4.20–5.80)
RDW: 12.2 % (ref 11.0–15.0)
Total Lymphocyte: 21.2 %
WBC: 7.3 10*3/uL (ref 3.8–10.8)

## 2018-11-25 LAB — TSH: TSH: 1.93 mIU/L (ref 0.40–4.50)

## 2018-11-25 LAB — HEMOGLOBIN A1C
Hgb A1c MFr Bld: 5.9 % of total Hgb — ABNORMAL HIGH (ref <5.7)
Mean Plasma Glucose: 123 (calc)
eAG (mmol/L): 6.8 (calc)

## 2018-11-25 LAB — INSULIN, RANDOM: Insulin: 4 u[IU]/mL

## 2018-11-25 LAB — MICROALBUMIN / CREATININE URINE RATIO
Creatinine, Urine: 49 mg/dL (ref 20–320)
Microalb Creat Ratio: 4 mcg/mg creat (ref <30)
Microalb, Ur: 0.2 mg/dL

## 2018-11-25 LAB — MAGNESIUM: Magnesium: 2.1 mg/dL (ref 1.5–2.5)

## 2018-11-25 LAB — VITAMIN D 25 HYDROXY (VIT D DEFICIENCY, FRACTURES): Vit D, 25-Hydroxy: 121 ng/mL — ABNORMAL HIGH (ref 30–100)

## 2018-11-30 ENCOUNTER — Encounter: Payer: Self-pay | Admitting: *Deleted

## 2018-12-21 ENCOUNTER — Other Ambulatory Visit: Payer: Self-pay

## 2018-12-21 DIAGNOSIS — Z1211 Encounter for screening for malignant neoplasm of colon: Secondary | ICD-10-CM

## 2018-12-21 LAB — POC HEMOCCULT BLD/STL (HOME/3-CARD/SCREEN)
Card #2 Fecal Occult Blod, POC: NEGATIVE
Card #3 Fecal Occult Blood, POC: NEGATIVE
Fecal Occult Blood, POC: NEGATIVE

## 2018-12-22 DIAGNOSIS — Z1211 Encounter for screening for malignant neoplasm of colon: Secondary | ICD-10-CM | POA: Diagnosis not present

## 2018-12-31 ENCOUNTER — Other Ambulatory Visit: Payer: Self-pay | Admitting: Internal Medicine

## 2018-12-31 ENCOUNTER — Encounter: Payer: Self-pay | Admitting: Internal Medicine

## 2018-12-31 ENCOUNTER — Other Ambulatory Visit (INDEPENDENT_AMBULATORY_CARE_PROVIDER_SITE_OTHER): Payer: Medicare Other | Admitting: Internal Medicine

## 2018-12-31 DIAGNOSIS — H6023 Malignant otitis externa, bilateral: Secondary | ICD-10-CM

## 2018-12-31 DIAGNOSIS — H6123 Impacted cerumen, bilateral: Secondary | ICD-10-CM

## 2018-12-31 MED ORDER — NEOMYCIN-POLYMYXIN-HC 3.5-10000-1 OT SUSP: OTIC | 3 refills | Status: DC

## 2018-12-31 NOTE — Progress Notes (Signed)
[facility]   [name], M.D.    [name]. [name], P.A.-C      [name], [place]                [address] [place], [place] [address] Telephone [phone] Telefax [phone] _________________________________  History of Present Illness:     This very nice 83 y.o. MWM with  HTN, ASCAD, HLD, T2_DM  and Vitamin D Deficiency called emergently today (with office closed) reporting severe pain in bilat ears Lt>>Rt. Patient was met in the office & examined and found to have bilat cerumen impact EAC's and both were irrigated to reveal very inflamed EAC's.   Medications  .  metFORMIN (GLUCOPHAGE-XR) 500 MG 24 hr tablet, Take 2 tablets 2 x  /day with meals for Diabetes .  doxazosin (CARDURA) 4 MG tablet, TAKE 1 TABLET BY MOUTH EVERYDAY AT BEDTIME .  ezetimibe (ZETIA) 10 MG tablet, TAKE 1 TABLET BY MOUTH EVERY DAY .  lisinopril (PRINIVIL,ZESTRIL) 5 MG tablet, TAKE 1 TABLET BY MOUTH DAILY. Marland Kitchen  aspirin EC 81 MG tablet, Take 1 tablet (81 mg total) by mouth every other day. .  clopidogrel (PLAVIX) 75 MG tablet, TAKE 1 TABLET BY MOUTH EVERY DAY .  CINNAMON PO, Take 1,000 mg by mouth daily.  .  finasteride (PROSCAR) 5 MG tablet, Take 5 mg by mouth daily. .  Magnesium 500 MG CAPS, Take 1 capsule by mouth daily. Marland Kitchen  neomycin-polymyxin-hydrocortisone (CORTISPORIN) 3.5-10000-1 OTIC suspension, Use 6-8 drops to affected ear 3 to 4 x /day .  VITAMIN D PO, Take 10,000 Units by mouth daily.  Problem list He has CAD S/P percutaneous coronary angioplasty and PCI- DES mid LAD in the setting of non-STEMI; Hyperlipidemia with statin intolerance; goal LDL < 70; Essential hypertension; Type II diabetes mellitus with nephropathy ([place]); Tobacco dependency; Overweight (BMI 25.0-29.9); Vitamin D deficiency; Medication management; BPH (benign prostatic hyperplasia); Synovial cyst of lumbar facet joint; Aortic  atherosclerosis (HCC); and Retinopathy, diabetic, bilateral (HCC) on their problem list.   Observations/Objective:  BP 139/89   Pulse 69   Temp 97.6 F (36.4 C)   Resp 12   Ht _0  (1.778 m)   Wt 195 lb (88.5 kg)   BMI 27.98 kg/m   BP 139/89   Pulse 69   Temp 97.6 F (36.4 C)   Resp 12   Ht _1  (1.778 m)   Wt 195 lb (88.5 kg)   BMI 27.98 kg/m   HEENT -  Bilat cerumen impactions. Both were copiously irrigated with a moderated amount of foul smelling waxy mucoid detritus. After irrigation, both EAC's were also manually disimpacted of waxy plugs and again irrigated til clear. Re-exam found very inflamed EAC';s., but fortunately both TM's appeared normal.  Naso-oropharynx appeared clear & unremarkable Neck - supple. No sig LN.  Chest - Clear equal BS. Cor - Nl HS. RRR w/o sig MGR. PP 1(+). No edema. MS- FROM w/o deformities.  Gait Nl. Neuro -  Nl w/o focal abnormalities.  Assessment and Plan:  1. Malignant otitis externa of both ears, unspecified chronicity  - neomycin-polymyxin-hydrocortisone (CORTISPORIN) 3.5-10000-1 OTIC suspension; Use 6-8 drops to affected ear 3 to 4 x /day  Dispense: 10 mL; Refill: 3  2. Bilateral impacted cerumen  - Bilat irrigation   Follow Up Instructions:  I discussed the assessment and  treatment plan with the patient. The patient was provided an opportunity to ask questions and all were answered. The patient agreed with the plan and demonstrated an understanding of the instructions. Between 20-25  minutes of exam, counseling, chart review, and critical decision making was performed. The patient was advised to call back or seek an in-person evaluation if the symptoms worsen or if the condition fails to improve as anticipated.   Kirtland Bouchard, MD

## 2019-02-23 ENCOUNTER — Other Ambulatory Visit: Payer: Self-pay | Admitting: Internal Medicine

## 2019-03-02 NOTE — Progress Notes (Signed)
FOLLOW UP 3 Months  Assessment and Plan:   Diagnoses and all orders for this visit:  Aortic atherosclerosis (HCC) Monitor lipids, blood pressure and glucose control  CAD S/P percutaneous coronary angioplasty and PCI- DES mid LAD in the setting of non-STEMI Follows with Cardiology ASA & Plavix continue medically controlled currently  Essential Hypertension Well controlled with current medications  Monitor blood pressure at home; patient to call if consistently greater than 130/80 Continue DASH diet.   Reminder to go to the ER if any CP, SOB, nausea, dizziness, severe HA, changes vision/speech, left arm numbness and tingling and jaw pain.  Type II diabetes mellitus with nephropathy (HCC) Continue Metformin 500mg  XR, two tablets twice a day with food Does not check glucose at home Continue diet and exercise.  Perform daily foot/skin check, notify office of any concerning changes.  Check A1C Taking Zetia for cholesterol, myalgias with previous statin trials  Hyperlipidemia associated with T2DM, LDL goal <70 Statin intolerant, declines all other meds including PCSK-9 inhibitor Understands risks Continue medications: zetia 10 mg daily  Continue low cholesterol diet and exercise.  Check lipid panel.   Tobacco dependency -not ready to quit Will continue to assess  Vitamin D Def Above at goal last visit; has reduced by over half continue supplementation to maintain goal of 70-100 Check Vit D level.  Obesity with co morbidities Long discussion about weight loss, diet, and exercise Recommended diet heavy in fruits and veggies and low in animal meats, cheeses, and dairy products, appropriate calorie intake Discussed ideal weight for height  Patient will work on increasing walking Will follow up in 3 months  Medication management -     CBC with Differential/Platelet -     COMPLETE METABOLIC PANEL WITH GFR -     Magnesium  Mild memory changes Mild gradual changes,  slow recall; no family history of Alzheimer's, high risk vascular history, current smoker without intention to quit Declines lab workup, imaging, or neuro referral today due to lab bill from last visit CT from 06/2009 shows mild cerebral atrophy without other concerning changes Control blood pressure, cholesterol, glucose, increase exercise, advised to quit smoking, continue ASA   Follow up in three months. Continue diet and meds as discussed. Further disposition pending results of labs. Discussed med's effects and SE's.   Over 30 minutes of exam, counseling, chart review, and critical decision making was performed.   Future Appointments  Date Time Provider [place]  06/06/2019 10:30 AM [name], MD [facility] None  12/12/2019  2:00 PM [name], MD [facility] None    ----------------------------------------------------------------------------------------------------------------------  HPI 83 y.o. male  presents for 3 month follow up on hypertension, cholesterol, DMII,BPH, tobacco dependency, weight and vitamin D deficiency.   Had urgery on 09/2017 for cyst removal lumbar by Dr Vertell Limber.  Reports he is still recovering from this and this cyst has been a reoccurring problem for him, has residual numbness of R extremity, notes for 5-10 min after long periods of immobility following the last surgery.   He reports gradual memory changes, takes longer to recall memories; denies forgetting to pay bills, getting lost, forgetting names of people he hasn't seen in a while. Thyroid functions have been normal in this patient; he has never had B12 checked in recent history. Last Ct brain in 2010 showed Mild cerebral atrophy. No hemorrhage, hydrocephalus, mass lesion, acute infarction, or significant intracranial injury.    he currently continues to smoke a pipe daily; discussed risks associated with smoking, patient  is not ready to quit.    BMI is Body mass index is 28.12 kg/m., he  has been working on diet, exercise is limited secondary to back pain. Follows low sugar and starch diet, wife cooks diabetes friendly.  Wt Readings from Last 3 Encounters:  03/03/19 196 lb (88.9 kg)  12/31/18 195 lb (88.5 kg)  11/24/18 195 lb (88.5 kg)   In 2011, he had NSTEMI with PCA/DES to the LAD and has done well since. Currently he is followed by Dr Ellyn Hack His blood pressure has been controlled at home, today their BP is BP: 110/62  He does not workout. He denies chest pain, shortness of breath, dizziness.   He is on cholesterol medication Zetia 10 mg daily and denies myalgias; he has hx of statin intolerance.  His cholesterol is not at goal of LDL <70, declined all other medications including PCSK-9 inhibitor with cardiology, understands risks. The cholesterol last visit was:   Lab Results  Component Value Date   CHOL 168 11/24/2018   HDL 53 11/24/2018   LDLCALC 93 11/24/2018   TRIG 121 11/24/2018   CHOLHDL 3.2 11/24/2018    He has not been working on diet and exercise for T2 diabetes (well controlled on metformin 500 mg 4 tabs daily), and denies hyperglycemia, hypoglycemia , nausea, polydipsia and polyuria. He does not check glucose secondary to consistently excellent control, has supplies if needed to check. Last A1C in the office was:  Lab Results  Component Value Date   HGBA1C 5.9 (H) 11/24/2018   Lab Results  Component Value Date   GFRNONAA 82 11/24/2018   Patient is on Vitamin D supplement, has decreased supplement - taking 10000 IU three times a week down from daily. Got $240 bill from last vitamin D check; requests to defer today Lab Results  Component Value Date   VD25OH 121 (H) 11/24/2018      Current Medications:  Current Outpatient Medications on File Prior to Visit  Medication Sig  . aspirin EC 81 MG tablet Take 1 tablet (81 mg total) by mouth every other day.  Marland Kitchen CINNAMON PO Take 1,000 mg by mouth daily.   . clopidogrel (PLAVIX) 75 MG tablet TAKE 1 TABLET  BY MOUTH EVERY DAY  . doxazosin (CARDURA) 4 MG tablet Take 1 tablet at Bedtime for BP & Prostate  . ezetimibe (ZETIA) 10 MG tablet TAKE 1 TABLET BY MOUTH EVERY DAY  . finasteride (PROSCAR) 5 MG tablet Take 5 mg by mouth daily.  Marland Kitchen lisinopril (PRINIVIL,ZESTRIL) 5 MG tablet TAKE 1 TABLET BY MOUTH DAILY. (Patient taking differently: every other day. )  . Magnesium 500 MG CAPS Take 1 capsule by mouth daily.  . metFORMIN (GLUCOPHAGE-XR) 500 MG 24 hr tablet Take 2 tablets 2 x  /day with meals for Diabetes  . VITAMIN D PO Take 10,000 Units by mouth daily. Takes 10,000 units three times a week  . neomycin-polymyxin-hydrocortisone (CORTISPORIN) 3.5-10000-1 OTIC suspension Use 6-8 drops to affected ear 3 to 4 x /day (Patient not taking: Reported on 03/03/2019)   No current facility-administered medications on file prior to visit.      Allergies:  Allergies  Allergen Reactions  . Atorvastatin Other (See Comments)    MYALGIAS CRAMPING  . Crestor [Rosuvastatin]     MYALGIAS CRAMPING  . Statins     MYALGIAS CRAMPING  . Tetanus Toxoids     UNSPECIFIED REACTION      Medical History:  Past Medical History:  Diagnosis Date  .  Arthritis   . BPH (benign prostatic hyperplasia)   . CAD S/P percutaneous coronary angioplasty 12/2009; 08/2010   a) 6/'22 - NSTEMI: PCI to LAD: Promus Element 2.5 mm x 15 mm DES; b) Cath for Angina: Prox LAD 60-70% pre-stent with FFR 0.82, 90% RVM -- Med Rx, EF 50-55%  . Essential hypertension   . Hyperlipidemia with target LDL less than 70    Statin intolerance  . Non-ST elevation MI (NSTEMI) [facility]) June 2011   PCI to LAD  . Pneumonia    DEC 2016  TX WITH ANTIBIOTIC  . Type 2 diabetes mellitus without complications (HCC)    Family history- Reviewed and unchanged Social history- Reviewed and unchanged   Review of Systems:  Review of Systems  Constitutional: Negative for chills, diaphoresis, fever, malaise/fatigue and weight loss.  HENT: Negative for congestion,  ear discharge, ear pain, hearing loss, nosebleeds, sinus pain, sore throat and tinnitus.   Eyes: Negative for blurred vision, double vision, photophobia, pain, discharge and redness.  Respiratory: Negative for cough, hemoptysis, sputum production, shortness of breath, wheezing and stridor.   Cardiovascular: Negative for chest pain, palpitations, orthopnea, claudication, leg swelling and PND.  Gastrointestinal: Negative for abdominal pain, blood in stool, constipation, diarrhea, heartburn, melena, nausea and vomiting.  Genitourinary: Negative for dysuria, flank pain, frequency, hematuria and urgency.  Musculoskeletal: Negative for back pain, falls, joint pain, myalgias and neck pain.       Right leg pain  Skin: Negative for itching and rash.       Right temporal, bandage.  Neurological: Negative for dizziness, tingling, tremors, sensory change, speech change, focal weakness, seizures, loss of consciousness, weakness and headaches.  Endo/Heme/Allergies: Negative for environmental allergies and polydipsia. Does not bruise/bleed easily.  Psychiatric/Behavioral: Negative for depression, hallucinations, memory loss, substance abuse and suicidal ideas. The patient is not nervous/anxious and does not have insomnia.     Physical Exam: BP 110/62   Pulse (!) 58   Temp (!) 96.9 F (36.1 C)   Wt 196 lb (88.9 kg)   SpO2 98%   BMI 28.12 kg/m  Wt Readings from Last 3 Encounters:  03/03/19 196 lb (88.9 kg)  12/31/18 195 lb (88.5 kg)  11/24/18 195 lb (88.5 kg)   General Appearance: Well nourished, in no apparent distress. Eyes: PERRLA, EOMs, conjunctiva no swelling or erythema Sinuses: No Frontal/maxillary tenderness ENT/Mouth: Ext aud canals clear, TMs without erythema, bulging. No erythema, swelling, or exudate on post pharynx.  Tonsils not swollen or erythematous. Hearing normal.  Neck: Supple, thyroid normal.  Respiratory: Respiratory effort normal, BS equal bilaterally without rales, rhonchi,  wheezing or stridor.  Cardio: RRR with no MRGs. Brisk peripheral pulses without edema.  Abdomen: Soft, + BS.  Non tender, no guarding, rebound, hernias, masses. Lymphatics: Non tender without lymphadenopathy.  Musculoskeletal: Full ROM, 5/5 strength, Normal gait Skin: Warm, dry without rashes, ecchymosis. Bandage, temporal from lesion removal. Neuro: Cranial nerves intact. No cerebellar symptoms.  Psych: Awake and oriented X 3, normal affect, Insight and Judgment appropriate.     Names of Other Physician/Practitioners you currently use: 1. [facility] here for primary care 2. Eye Exam 2019 3. Dentist 2019  Patient Care Team: [name], MD as PCP - General (Internal Medicine) Leonie Man, MD as PCP - Cardiology (Cardiology) Laurence Spates, MD as Consulting Physician (Gastroenterology) Leonie Man, MD as Consulting Physician (Cardiology) Erline Levine, MD as Consulting Physician (Neurosurgery) Carolan Clines, MD (Inactive) as Consulting Physician (Urology)  Screening Tests: Immunization History  Administered Date(s) Administered  . Influenza, High Dose Seasonal PF 06/01/2013, 04/18/2014, 05/23/2015, 05/20/2016, 04/10/2017, 05/13/2018  . Influenza-Unspecified 05/13/2011  . Pneumococcal Conjugate-13 04/18/2014  . Pneumococcal Polysaccharide-23 07/21/2002, 05/23/2015  . Td 07/21/2000    Preventative care: Last colonoscopy: 2018 no further screened necessary.   Vaccinations: TD or Tdap: 2002, Due discussed with patient, declined.  Influenza: 2019 Pneumococcal: 2016 Prevnar13: 2015 Shingles/Zostavax: Discussed with patient    Izora Ribas, NP 11:11 AM [facility]

## 2019-03-03 ENCOUNTER — Encounter: Payer: Self-pay | Admitting: Adult Health

## 2019-03-03 ENCOUNTER — Other Ambulatory Visit: Payer: Self-pay

## 2019-03-03 ENCOUNTER — Ambulatory Visit (INDEPENDENT_AMBULATORY_CARE_PROVIDER_SITE_OTHER): Payer: Medicare Other | Admitting: Adult Health

## 2019-03-03 VITALS — BP 110/62 | HR 58 | Temp 96.9°F | Wt 196.0 lb

## 2019-03-03 DIAGNOSIS — E785 Hyperlipidemia, unspecified: Secondary | ICD-10-CM | POA: Diagnosis not present

## 2019-03-03 DIAGNOSIS — F17200 Nicotine dependence, unspecified, uncomplicated: Secondary | ICD-10-CM

## 2019-03-03 DIAGNOSIS — Z9861 Coronary angioplasty status: Secondary | ICD-10-CM

## 2019-03-03 DIAGNOSIS — Z79899 Other long term (current) drug therapy: Secondary | ICD-10-CM | POA: Diagnosis not present

## 2019-03-03 DIAGNOSIS — E1121 Type 2 diabetes mellitus with diabetic nephropathy: Secondary | ICD-10-CM | POA: Diagnosis not present

## 2019-03-03 DIAGNOSIS — E663 Overweight: Secondary | ICD-10-CM

## 2019-03-03 DIAGNOSIS — E1169 Type 2 diabetes mellitus with other specified complication: Secondary | ICD-10-CM | POA: Diagnosis not present

## 2019-03-03 DIAGNOSIS — R413 Other amnesia: Secondary | ICD-10-CM | POA: Diagnosis not present

## 2019-03-03 DIAGNOSIS — I10 Essential (primary) hypertension: Secondary | ICD-10-CM

## 2019-03-03 DIAGNOSIS — I2510 Atherosclerotic heart disease of native coronary artery without angina pectoris: Secondary | ICD-10-CM

## 2019-03-03 DIAGNOSIS — E559 Vitamin D deficiency, unspecified: Secondary | ICD-10-CM

## 2019-03-03 DIAGNOSIS — I700 Atherosclerosis of aorta: Secondary | ICD-10-CM | POA: Diagnosis not present

## 2019-03-03 NOTE — Patient Instructions (Addendum)
Recommend you incorporate some sort of exercise to improve circulation daily, can be chair exercises, etc as tolerated with back condition    Dementia Dementia is a condition that affects the way the brain functions. It often affects memory and thinking. Usually, dementia gets worse with time and cannot be reversed (progressive dementia). There are many types of dementia, including:  Alzheimer's disease. This type is the most common.  Vascular dementia. This type may happen as the result of a stroke.  Lewy body dementia. This type may happen to people who have Parkinson's disease.  Frontotemporal dementia. This type is caused by damage to nerve cells (neurons) in certain parts of the brain. Some people may be affected by more than one type of dementia. This is called mixed dementia. What are the causes? Dementia is caused by damage to cells in the brain. The area of the brain and the types of cells damaged determine the type of dementia. Usually, this damage is irreversible or cannot be undone. Some examples of irreversible causes include:  Conditions that affect the blood vessels of the brain, such as diabetes, heart disease, or blood vessel disease.  Genetic mutations. In some cases, changes in the brain may be caused by another condition and can be reversed or slowed. Some examples of reversible causes include:  Injury to the brain.  Certain medicines.  Infection, such as meningitis.  Metabolic problems, such as vitamin B12 deficiency or thyroid disease.  Pressure on the brain, such as from a tumor or blood clot. What are the signs or symptoms? Symptoms of dementia depend on the type of dementia. Common signs of dementia include problems with remembering, thinking, problem solving, decision making, and communicating. These signs develop slowly or get worse with time. This may include:  Problems remembering things.  Having trouble taking a bath or putting clothes on.   Forgetting appointments.  Forgetting to pay bills.  Difficulty planning and preparing meals.  Having trouble speaking.  Getting lost easily. How is this diagnosed? This condition is diagnosed by a specialist (neurologist). It is diagnosed based on the history of your symptoms, your medical history, a physical exam, and tests. Tests may include:  Tests to evaluate brain function, such as memory tests, cognitive tests, and other tests.  Lab tests, such as blood or urine tests.  Imaging tests, such as a CT scan, a PET scan, or an MRI.  Genetic testing. This may be done if other family members have a diagnosis of certain types of dementia. Your health care provider will talk with you and your family, friends, or caregivers about your history and symptoms. How is this treated?  Treatment for this condition depends on the cause of the dementia. Progressive dementias, such as Alzheimer's disease, cannot be cured, but there may be treatments that help to manage symptoms. Treatment might involve taking medicines that may help to:  Control the dementia.  Slow down the progression of the dementia.  Manage symptoms. In some cases, treating the cause of your dementia can improve symptoms, reverse symptoms, or slow down how quickly your dementia becomes worse. Your health care provider can direct you to support groups, organizations, and other health care providers who can help with decisions about your care. Follow these instructions at home: Medicines  Take over-the-counter and prescription medicines only as told by your health care provider.  Use a pill organizer or pill reminder to help you manage your medicines.  Avoid taking medicines that can affect thinking, such as  pain medicines or sleeping medicines. Lifestyle  Make healthy lifestyle choices. ? Be physically active as told by your health care provider. ? Do not use any products that contain nicotine or tobacco, such as  cigarettes, e-cigarettes, and chewing tobacco. If you need help quitting, ask your health care provider. ? Do not drink alcohol. ? Practice stress-management techniques when you get stressed. ? Spend time with other people.  Make sure to get quality sleep. These tips can help you get a good night's rest: ? Avoid napping during the day. ? Keep your sleeping area dark and cool. ? Avoid exercising during the few hours before you go to bed. ? Avoid caffeine products in the evening. Eating and drinking  Drink enough fluid to keep your urine pale yellow.  Eat a healthy diet. General instructions   Work with your health care provider to determine what you need help with and what your safety needs are.  Talk with your health care provider about whether it is safe for you to drive.  If you were given a bracelet that identifies you as a person with memory loss or tracks your location, make sure to wear it at all times.  Work with your family to make important decisions, such as advance directives, medical power of attorney, or a living will.  Keep all follow-up visits as told by your health care provider. This is important. Where to find more information  Alzheimer's Association: CapitalMile.co.nz  National Institute on Aging: DVDEnthusiasts.nl  World Health Organization: RoleLink.com.br Contact a health care provider if:  You have any new or worsening symptoms.  You have problems with choking or swallowing. Get help right away if:  You feel depressed or sad, or feel that you want to harm yourself.  Your family members become concerned for your safety. If you ever feel like you may hurt yourself or others, or have thoughts about taking your own life, get help right away. You can go to your nearest emergency department or call:  Your local emergency services (911 in the [place]  A suicide crisis helpline, such as the [place] at 573-099-2328. This is  open 24 hours a day. Summary  Dementia is a condition that affects the way the brain functions. Dementia often affects memory and thinking.  Usually, dementia gets worse with time and cannot be reversed (progressive dementia).  Treatment for this condition depends on the cause of the dementia.  Work with your health care provider to determine what you need help with and what your safety needs are.  Your health care provider can direct you to support groups, organizations, and other health care providers who can help with decisions about your care. This information is not intended to replace advice given to you by your health care provider. Make sure you discuss any questions you have with your health care provider. Document Released: 12/31/2000 Document Revised: 09/21/2018 Document Reviewed: 09/21/2018 Elsevier Patient Education  2020 [employer].

## 2019-03-04 LAB — COMPLETE METABOLIC PANEL WITH GFR
AG Ratio: 1.9 (calc) (ref 1.0–2.5)
ALT: 14 U/L (ref 9–46)
AST: 14 U/L (ref 10–35)
Albumin: 4.3 g/dL (ref 3.6–5.1)
Alkaline phosphatase (APISO): 55 U/L (ref 35–144)
BUN: 14 mg/dL (ref 7–25)
CO2: 29 mmol/L (ref 20–32)
Calcium: 10 mg/dL (ref 8.6–10.3)
Chloride: 102 mmol/L (ref 98–110)
Creat: 0.83 mg/dL (ref 0.70–1.11)
GFR, Est African American: 96 mL/min/{1.73_m2} (ref >=60)
GFR, Est Non African American: 83 mL/min/{1.73_m2} (ref >=60)
Globulin: 2.3 g/dL (calc) (ref 1.9–3.7)
Glucose, Bld: 90 mg/dL (ref 65–99)
Potassium: 4.5 mmol/L (ref 3.5–5.3)
Sodium: 138 mmol/L (ref 135–146)
Total Bilirubin: 0.5 mg/dL (ref 0.2–1.2)
Total Protein: 6.6 g/dL (ref 6.1–8.1)

## 2019-03-04 LAB — CBC WITH DIFFERENTIAL/PLATELET
Absolute Monocytes: 912 cells/uL (ref 200–950)
Basophils Absolute: 56 cells/uL (ref 0–200)
Basophils Relative: 0.7 %
Eosinophils Absolute: 360 cells/uL (ref 15–500)
Eosinophils Relative: 4.5 %
HCT: 46.2 % (ref 38.5–50.0)
Hemoglobin: 15.5 g/dL (ref 13.2–17.1)
Lymphs Abs: 1640 cells/uL (ref 850–3900)
MCH: 32.2 pg (ref 27.0–33.0)
MCHC: 33.5 g/dL (ref 32.0–36.0)
MCV: 96 fL (ref 80.0–100.0)
MPV: 10.7 fL (ref 7.5–12.5)
Monocytes Relative: 11.4 %
Neutro Abs: 5032 cells/uL (ref 1500–7800)
Neutrophils Relative %: 62.9 %
Platelets: 271 10*3/uL (ref 140–400)
RBC: 4.81 10*6/uL (ref 4.20–5.80)
RDW: 12.3 % (ref 11.0–15.0)
Total Lymphocyte: 20.5 %
WBC: 8 10*3/uL (ref 3.8–10.8)

## 2019-03-04 LAB — TSH: TSH: 1.67 mIU/L (ref 0.40–4.50)

## 2019-03-04 LAB — LIPID PANEL
Cholesterol: 175 mg/dL (ref <200)
HDL: 58 mg/dL (ref >=40)
LDL Cholesterol (Calc): 95 mg/dL (calc)
Non-HDL Cholesterol (Calc): 117 mg/dL (calc) (ref <130)
Total CHOL/HDL Ratio: 3 (calc) (ref <5.0)
Triglycerides: 122 mg/dL (ref <150)

## 2019-03-04 LAB — HEMOGLOBIN A1C
Hgb A1c MFr Bld: 5.8 % of total Hgb — ABNORMAL HIGH (ref <5.7)
Mean Plasma Glucose: 120 (calc)
eAG (mmol/L): 6.6 (calc)

## 2019-03-04 LAB — MAGNESIUM: Magnesium: 2.2 mg/dL (ref 1.5–2.5)

## 2019-04-04 ENCOUNTER — Other Ambulatory Visit: Payer: Self-pay | Admitting: Cardiology

## 2019-04-20 ENCOUNTER — Other Ambulatory Visit: Payer: Self-pay | Admitting: Physician Assistant

## 2019-05-04 DIAGNOSIS — M48062 Spinal stenosis, lumbar region with neurogenic claudication: Secondary | ICD-10-CM | POA: Diagnosis not present

## 2019-05-04 DIAGNOSIS — M4316 Spondylolisthesis, lumbar region: Secondary | ICD-10-CM | POA: Diagnosis not present

## 2019-05-04 DIAGNOSIS — Z6827 Body mass index (BMI) 27.0-27.9, adult: Secondary | ICD-10-CM | POA: Diagnosis not present

## 2019-05-04 DIAGNOSIS — M5416 Radiculopathy, lumbar region: Secondary | ICD-10-CM | POA: Diagnosis not present

## 2019-05-04 DIAGNOSIS — M7130 Other bursal cyst, unspecified site: Secondary | ICD-10-CM | POA: Diagnosis not present

## 2019-05-10 ENCOUNTER — Other Ambulatory Visit: Payer: Self-pay

## 2019-05-10 ENCOUNTER — Ambulatory Visit (INDEPENDENT_AMBULATORY_CARE_PROVIDER_SITE_OTHER): Payer: Medicare Other

## 2019-05-10 VITALS — Temp 97.5°F

## 2019-05-10 DIAGNOSIS — Z23 Encounter for immunization: Secondary | ICD-10-CM

## 2019-05-13 DIAGNOSIS — M48062 Spinal stenosis, lumbar region with neurogenic claudication: Secondary | ICD-10-CM | POA: Diagnosis not present

## 2019-05-17 DIAGNOSIS — M5416 Radiculopathy, lumbar region: Secondary | ICD-10-CM | POA: Diagnosis not present

## 2019-05-17 DIAGNOSIS — M48061 Spinal stenosis, lumbar region without neurogenic claudication: Secondary | ICD-10-CM | POA: Diagnosis not present

## 2019-05-17 DIAGNOSIS — M47815 Spondylosis without myelopathy or radiculopathy, thoracolumbar region: Secondary | ICD-10-CM | POA: Diagnosis not present

## 2019-05-17 DIAGNOSIS — M5127 Other intervertebral disc displacement, lumbosacral region: Secondary | ICD-10-CM | POA: Diagnosis not present

## 2019-05-18 DIAGNOSIS — Z6827 Body mass index (BMI) 27.0-27.9, adult: Secondary | ICD-10-CM | POA: Diagnosis not present

## 2019-05-18 DIAGNOSIS — M5416 Radiculopathy, lumbar region: Secondary | ICD-10-CM | POA: Diagnosis not present

## 2019-05-18 DIAGNOSIS — M4316 Spondylolisthesis, lumbar region: Secondary | ICD-10-CM | POA: Diagnosis not present

## 2019-05-18 DIAGNOSIS — M48062 Spinal stenosis, lumbar region with neurogenic claudication: Secondary | ICD-10-CM | POA: Diagnosis not present

## 2019-05-18 DIAGNOSIS — M545 Low back pain: Secondary | ICD-10-CM | POA: Diagnosis not present

## 2019-05-18 DIAGNOSIS — M7130 Other bursal cyst, unspecified site: Secondary | ICD-10-CM | POA: Diagnosis not present

## 2019-05-26 ENCOUNTER — Telehealth: Payer: Self-pay | Admitting: *Deleted

## 2019-05-26 NOTE — Telephone Encounter (Signed)
Pt has been scheduled to see Dr. Ellyn Hack 05/30/19 @ 9:20 am for surgery clearance. Pt aware to wear his mask during the whole visit. I will send clearance information to Dr. Ellyn Hack for appt on 05/30/19. I will remove from the pre op call back pool. Pt thanked me for the call.

## 2019-05-26 NOTE — Telephone Encounter (Signed)
   [facility] Pre-operative Risk Assessment    Request for surgical clearance:  1. What type of surgery is being performed? LS-LEFT, L3-4, ESI    2. When is this surgery scheduled? 06[birth year]29/1938    3. What type of clearance is required (medical clearance vs. Pharmacy clearance to hold med vs. Both)? BOTH  4. Are there any medications that need to be held prior to surgery and how long?PLAVIX FOR 7 DAYS    5. Practice name and name of physician performing surgery? DR Clydell Hakim [facility]    6. What is your office phone number? [phone]     7.   What is your office fax number? [phone]   8.   Anesthesia type (None, local, MAC, general) ?

## 2019-05-26 NOTE — Telephone Encounter (Signed)
   Primary Cardiologist:David Ellyn Hack, MD  Chart reviewed as part of pre-operative protocol coverage. Because of MILA BIRNER past medical history and time since last visit, he/she will require a follow-up visit in order to better assess preoperative cardiovascular risk.  Pre-op covering staff: - Please schedule appointment and call patient to inform them. - Please contact requesting surgeon's office via preferred method (i.e, phone, fax) to inform them of need for appointment prior to surgery.  If applicable, this message will also be routed to pharmacy pool and/or primary cardiologist for input on holding anticoagulant/antiplatelet agent as requested below so that this information is available at time of patient's appointment.   Kathyrn Drown, NP  05/26/2019, 4:31 PM

## 2019-05-30 ENCOUNTER — Other Ambulatory Visit: Payer: Self-pay

## 2019-05-30 ENCOUNTER — Encounter: Payer: Self-pay | Admitting: Cardiology

## 2019-05-30 ENCOUNTER — Ambulatory Visit (INDEPENDENT_AMBULATORY_CARE_PROVIDER_SITE_OTHER): Payer: Medicare Other | Admitting: Cardiology

## 2019-05-30 VITALS — BP 128/65 | HR 56 | Ht 70.5 in | Wt 194.0 lb

## 2019-05-30 DIAGNOSIS — E1169 Type 2 diabetes mellitus with other specified complication: Secondary | ICD-10-CM | POA: Diagnosis not present

## 2019-05-30 DIAGNOSIS — I2510 Atherosclerotic heart disease of native coronary artery without angina pectoris: Secondary | ICD-10-CM | POA: Diagnosis not present

## 2019-05-30 DIAGNOSIS — Z01818 Encounter for other preprocedural examination: Secondary | ICD-10-CM

## 2019-05-30 DIAGNOSIS — E785 Hyperlipidemia, unspecified: Secondary | ICD-10-CM

## 2019-05-30 DIAGNOSIS — Z9861 Coronary angioplasty status: Secondary | ICD-10-CM

## 2019-05-30 DIAGNOSIS — I700 Atherosclerosis of aorta: Secondary | ICD-10-CM | POA: Diagnosis not present

## 2019-05-30 DIAGNOSIS — I10 Essential (primary) hypertension: Secondary | ICD-10-CM | POA: Diagnosis not present

## 2019-05-30 MED ORDER — NEXLETOL 180 MG PO TABS: 180.0000 mg | ORAL_TABLET | Freq: Every day | ORAL | 6 refills | Status: DC

## 2019-05-30 MED ORDER — ASPIRIN EC 81 MG PO TBEC: 81.0000 mg | DELAYED_RELEASE_TABLET | Freq: Every day | ORAL | 11 refills | Status: DC

## 2019-05-30 NOTE — Progress Notes (Signed)
Primary Care Provider: [name], MD Cardiologist: Glenetta Hew, MD Electrophysiologist:   Clinic Note: Chief Complaint  Patient presents with  . Follow-up    Mostly bothered by Back - R leg issues  . Coronary Artery Disease    HPI:    David [name] is a 83 y.o. male with a PMH below (notable for CAD-PCI) who presents today for annual f/u.  For CAD-PCI with CRFs. He is a former patient Dr. Aldona Bar. 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   NSTEMI in June of 2011with PCI to the LAD & moderate residual CAD.   Relook catheterization in February 2012 identified a moderate lesion proximal to the LAD stent. FFR 0.82 --> medical management and he has done well since. David [name] was last seen on Sept 3, 2019  Recent Hospitalizations: n/a  Reviewed  CV studies:    The following studies were reviewed today: (if available, images/films reviewed: From Epic Chart or Care Everywhere) . N/a:   Interval History:   David [name] returns here today for essentially annual follow-up really not having any way of any cardiac symptoms.  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  This really limits his ability to walk.  He is hoping to have a spinal injection on 10 December.  He also notes that with the COVID-19 restrictions, he has been almost to the point of being depressed.  He does not really go out do much anything, he pretty much stays at home.  He does see his grandkids.  He pretty much denies any angina or heart failure symptoms.  CV Review of Symptoms (Summary) no chest pain or dyspnea on exertion positive for - Mild lower leg swelling, but stable. negative for - irregular heartbeat, loss of consciousness, orthopnea, palpitations, paroxysmal nocturnal dyspnea, rapid heart rate, shortness of breath or Syncope/near syncope, TIA/amaurosis fugax.  Not walking  enough to get claudication Difficult to fully assess exertional symptoms because of his limited mobility with mostly right leg pain from his spinal cyst.   ++ easy bruising  The patient does not have symptoms concerning for COVID-19 infection (fever, chills, cough, or new shortness of breath).  The patient is practicing social distancing. ++ Masking.  ++ Groceries/shopping.    REVIEWED OF SYSTEMS   A comprehensive ROS was performed. Review of Systems  Constitutional: Negative for malaise/fatigue and weight loss.  HENT: Negative for congestion and nosebleeds.   Respiratory: Negative for cough, shortness of breath and wheezing.   Cardiovascular:       Per HPI  Gastrointestinal: Negative for blood in stool, diarrhea, heartburn, melena, nausea and vomiting.  Genitourinary: Negative for hematuria.  Musculoskeletal: Positive for back pain (radiates to R leg).  Neurological: Positive for tingling (R leg) and focal weakness (R leg - weak/numb when walking (related to spinal cyst)). Negative for dizziness, weakness and headaches.  Psychiatric/Behavioral: Negative for depression (Just disheartened by the COVID-19 issues -> lack of socialization). The patient has insomnia (occasionally has difficulty falling asleep -no issues once he is asleep).     I have reviewed and (if needed) personally updated the patient's problem list, medications, allergies, past medical and surgical history, social and family history.   PAST MEDICAL HISTORY   Past Medical History:  Diagnosis Date  . Arthritis   . BPH (benign prostatic hyperplasia)   . CAD S/P percutaneous coronary  angioplasty 12/2009; 08/2010   a) 6/'22 - NSTEMI: PCI to LAD: Promus Element 2.5 mm x 15 mm DES; b) Cath for Angina: Prox LAD 60-70% pre-stent with FFR 0.82, 90% RVM -- Med Rx, EF 50-55%  . Essential hypertension   . Hyperlipidemia with target LDL less than 70    Statin intolerance  . Non-ST elevation MI (NSTEMI) [facility]) June 2011   PCI to  LAD  . Pneumonia    DEC 2016  TX WITH ANTIBIOTIC  . Type 2 diabetes mellitus without complications ([facility])      PAST SURGICAL HISTORY   Past Surgical History:  Procedure Laterality Date  . CARDIAC CATHETERIZATION  12/2009   Proximal LAD stenosis followed by a significant 80-90% distal stenosis  . CARDIAC CATHETERIZATION  February 2012    90% ostial RV marginal branch; 60-70% proximal LAD with widely patent distal stent. FFR 0.82; medical therapy  . CATARACT EXTRACTION W/ INTRAOCULAR LENS  IMPLANT, BILATERAL    . CORONARY ANGIOPLASTY  12/2009   PTCA to proximal LAD; PCI with Promus Element DES stent  2.5 mm x 15 mm  distalLAD - .for non-ST elevation MI  . DENTAL SURGERY    . LUMBAR LAMINECTOMY/DECOMPRESSION MICRODISCECTOMY N/A 08/09/2015   Procedure: Lumbar Four-Five, Lumbar Five- Sacral One Decompressive Lumbar Laminectomy with Resection of Synovial Cyst;  Surgeon: Erline Levine, MD;  Location: [facility];  Service: Neurosurgery;  Laterality: N/A;  L4 to S1 Decompressive Lumbar Laminectomy  . LUMBAR LAMINECTOMY/DECOMPRESSION MICRODISCECTOMY Right 01/21/2016   Procedure: Redo Right Lumbar Five-Sacral One Laminectomy for synovial cyst;  Surgeon: Erline Levine, MD;  Location: [facility];  Service: Neurosurgery;  Laterality: Right;  right  . LUMBAR LAMINECTOMY/DECOMPRESSION MICRODISCECTOMY Right 10/08/2017   Procedure: Right Lumbar three-four Laminectomy for synovial cyst;  Surgeon: Erline Levine, MD;  Location: [facility];  Service: Neurosurgery;  Laterality: Right;  . NASAL FRACTURE SURGERY    . TRANSTHORACIC ECHOCARDIOGRAM  June 2011   - (EF not reported) Moderately dilated LV; moderate hypokinesis of anteroseptal and anterior wall consistent with MI. Grade 1 diastolic dysfunction. Mild to moderately dilated LA  . VASECTOMY      MEDICATIONS/ALLERGIES   Current Meds  Medication Sig  . aspirin EC 81 MG tablet Take 1 tablet (81 mg total) by mouth daily.  Marland Kitchen CINNAMON PO Take 1,000 mg by mouth  daily.   Marland Kitchen doxazosin (CARDURA) 4 MG tablet Take 1 tablet at Bedtime for BP & Prostate  . ezetimibe (ZETIA) 10 MG tablet TAKE 1 TABLET BY MOUTH EVERY DAY  . finasteride (PROSCAR) 5 MG tablet Take 5 mg by mouth daily.  Marland Kitchen lisinopril (ZESTRIL) 5 MG tablet TAKE 1 TABLET BY MOUTH DAILY.  . Magnesium 500 MG CAPS Take 1 capsule by mouth daily.  . metFORMIN (GLUCOPHAGE-XR) 500 MG 24 hr tablet Take 2 tablets 2 x  /day with meals for Diabetes  . neomycin-polymyxin-hydrocortisone (CORTISPORIN) 3.5-10000-1 OTIC suspension Use 6-8 drops to affected ear 3 to 4 x /day  . VITAMIN D PO Take 10,000 Units by mouth daily. Takes 10,000 units three times a week  . [DISCONTINUED] aspirin EC 81 MG tablet Take 1 tablet (81 mg total) by mouth every other day.  . [DISCONTINUED] clopidogrel (PLAVIX) 75 MG tablet TAKE 1 TABLET BY MOUTH EVERY DAY    Allergies  Allergen Reactions  . Atorvastatin Other (See Comments)    MYALGIAS CRAMPING  . Crestor [Rosuvastatin]     MYALGIAS CRAMPING  . Statins     MYALGIAS  CRAMPING  . Tetanus Toxoids     UNSPECIFIED REACTION      SOCIAL HISTORY/FAMILY HISTORY   Social History   Tobacco Use  . Smoking status: Current Every Day Smoker    Years: 60.00    Types: Pipe  . Smokeless tobacco: Never Used  . Tobacco comment: smokes pipe qd  Substance Use Topics  . Alcohol use: Yes    Alcohol/week: 7.0 standard drinks    Types: 7 Standard drinks or equivalent per week    Comment: drinks 4 ounces of vodka daily  . Drug use: No   Social History   Social History Narrative   He is a married father of 2 with no grandchildren as of yet. He continued to smoke a pipe several times the course of day. He does have an occasional alcoholic beverage. While not involved a standard exercise routine, he is very active with walking and working in the yard, splitting wood and doing aggressive yard work including Production designer, theatre/television/film.    Family History family history includes  Cancer in his brother, father, sister, and son; Stroke in his father.   OBJCTIVE -PE, EKG, labs   Wt Readings from Last 3 Encounters:  05/30/19 194 lb (88 kg)  03/03/19 196 lb (88.9 kg)  12/31/18 195 lb (88.5 kg)    Physical Exam: BP 128/65   Pulse (!) 56   Ht 5' 10.5" (1.791 m)   Wt 194 lb (88 kg)   SpO2 99%   BMI 27.44 kg/m  Physical Exam  Constitutional: He is oriented to person, place, and time. He appears well-developed and well-nourished.  Healthy-appearing elderly gentleman.  No acute distress.  Well-groomed.  HENT:  Head: Normocephalic and atraumatic.  Neck: Normal range of motion. Neck supple. No hepatojugular reflux and no JVD present. Carotid bruit is not present.  Cardiovascular: Regular rhythm, normal heart sounds and intact distal pulses.  Occasional extrasystoles are present. Bradycardia present. PMI is not displaced. Exam reveals no gallop and no friction rub.  No murmur heard. Pulmonary/Chest: Effort normal and breath sounds normal. No respiratory distress. He has no wheezes. He has no rales.  Abdominal: Soft. Bowel sounds are normal. He exhibits no distension. There is no abdominal tenderness. There is no rebound.  No HSM  Musculoskeletal: Normal range of motion.        General: No edema (Trivial).     Comments: Somewhat slowed and antalgic gait.  Neurological: He is alert and oriented to person, place, and time.  Psychiatric: He has a normal mood and affect. His behavior is normal. Judgment and thought content normal.  Vitals reviewed.   Adult ECG Report -n/a  Recent Labs:   Lab Results  Component Value Date   CHOL 175 03/03/2019   HDL 58 03/03/2019   LDLCALC 95 03/03/2019   TRIG 122 03/03/2019   CHOLHDL 3.0 03/03/2019   Lab Results  Component Value Date   CREATININE 0.83 03/03/2019   BUN 14 03/03/2019   NA 138 03/03/2019   K 4.5 03/03/2019   CL 102 03/03/2019   CO2 29 03/03/2019    ASSESSMENT/PLAN    Problem List Items Addressed This  Visit    CAD S/P percutaneous coronary angioplasty and PCI- DES mid LAD in the setting of non-STEMI - Primary (Chronic)    Very distant history of MI.  Most recent cardiac cath was in 2012.  FFR of the LAD was still considered to be nonischemic.  He is not really  having any angina symptoms.  Last stent was many years ago.  We can probably simply stop Plavix and have him go back on maintenance dose daily aspirin.  He is on lisinopril.  Not on beta-blocker because of resting bradycardia.  Statin intolerant.  Is on Zetia.  Adding Nexletol.  Plan: DC Plavix increase aspirin to 81 mg daily. --Okay to hold aspirin for procedures (would hold 7 to 9 days prior to back injection)      Relevant Medications   Bempedoic Acid (NEXLETOL) 180 MG TABS   aspirin EC 81 MG tablet   Other Relevant Orders   Lipid panel   Comprehensive metabolic panel   Hyperlipidemia associated with type 2 diabetes mellitus (HCC) (Chronic)    Goal LDL should be less than 70 with his CAD.  He seems to be tolerating Zetia well and LDL was 95 on last check. Plan: We will try Nexletol (we will ask [facility] pharmacist to contact him about potential options for medication assistance)  Recheck labs in about 3 to 4 months.      Relevant Medications   Bempedoic Acid (NEXLETOL) 180 MG TABS   aspirin EC 81 MG tablet   Other Relevant Orders   Lipid panel   Comprehensive metabolic panel   Essential hypertension (Chronic)    Blood pressure stable today.  Continue current dose of ACE inhibitor.      Relevant Medications   Bempedoic Acid (NEXLETOL) 180 MG TABS   aspirin EC 81 MG tablet   Aortic atherosclerosis (HCC) (Chronic)    Continue cardiovascular risk modification.  Blood pressure well controlled.  Lipid control has been decent, but not quite to goal. No interest in stopping his pipe smoking.  Could consider AAA screening evaluation at follow-up visit.      Relevant Medications   Bempedoic Acid (NEXLETOL) 180 MG TABS    aspirin EC 81 MG tablet   Preoperative clearance    No planned surgery, but he does have planned spinal injection. He is low risk from a cardiac standpoint with no active symptoms. We are stopping Plavix now so would be out of his system by the time his procedure will be done in December.  We are putting him back on baby (81 mg) aspirin which can be held 7 to 9 days preprocedure.          COVID-19 Education: The signs and symptoms of COVID-19 were discussed with the patient and how to seek care for testing (follow up with PCP or arrange E-visit).   The importance of social distancing was discussed today.  I spent a total of 74minutes with the patient and chart review. >  50% of the time was spent in direct patient consultation.  Additional time spent with chart review (studies, outside notes, etc): 5 Total Time: 27 min   Current medicines are reviewed at length with the patient today.  (+/- concerns) n/a   Patient Instructions / Medication Changes & Studies & Tests Ordered   Patient Instructions  Medication Instructions:  STOP TAKING PLAVIX  START TAKING ASPIRIN 81MG  ONE TABLET DAILY   START NEXLETOL  180 MG  ONE TABLET DAILY   *If you need a refill on your cardiac medications before your next appointment, please call your pharmacy*  Lab Work: IN 3 MONTHS AFTER STARTING  NEXETOL    If you have labs (blood work) drawn today and your tests are completely normal, you will receive your results only by: Marland Kitchen MyChart Message (if you have  MyChart) OR . A paper copy in the mail If you have any lab test that is abnormal or we need to change your treatment, we will call you to review the results.  Testing/Procedures: NOT NEEDED   Follow-Up: At [facility], you and your health needs are our priority.  As part of our continuing mission to provide you with exceptional heart care, we have created designated Provider Care Teams.  These Care Teams include your primary Cardiologist  (physician) and Advanced Practice Providers (APPs -  Physician Assistants and Nurse Practitioners) who all work together to provide you with the care you need, when you need it.  Your next appointment:   12 months  The format for your next appointment:   In Person  Provider:   Glenetta Hew, MD  Other Instructions   OKAY TO HOLD ASPIRIN FOR 7 DAYS  FOR SPINAL INJECTIONS    Studies Ordered:   Orders Placed This Encounter  Procedures  . Lipid panel  . Comprehensive metabolic panel     Glenetta Hew, M.D., M.S. Interventional Cardiologist   Pager # [phone] Phone # [phone] [address] [place], [place] [address]   Thank you for choosing Heartcare at [facility]!!

## 2019-05-30 NOTE — Patient Instructions (Addendum)
Medication Instructions:  STOP TAKING PLAVIX  START TAKING ASPIRIN 81MG  ONE TABLET DAILY   START NEXLETOL  180 MG  ONE TABLET DAILY   *If you need a refill on your cardiac medications before your next appointment, please call your pharmacy*  Lab Work: IN 3 MONTHS AFTER STARTING  NEXETOL    If you have labs (blood work) drawn today and your tests are completely normal, you will receive your results only by: Marland Kitchen MyChart Message (if you have MyChart) OR . A paper copy in the mail If you have any lab test that is abnormal or we need to change your treatment, we will call you to review the results.  Testing/Procedures: NOT NEEDED   Follow-Up: At [facility], you and your health needs are our priority.  As part of our continuing mission to provide you with exceptional heart care, we have created designated Provider Care Teams.  These Care Teams include your primary Cardiologist (physician) and Advanced Practice Providers (APPs -  Physician Assistants and Nurse Practitioners) who all work together to provide you with the care you need, when you need it.  Your next appointment:   12 months  The format for your next appointment:   In Person  Provider:   Glenetta Hew, MD  Other Instructions   OKAY TO HOLD ASPIRIN FOR 7 DAYS  FOR SPINAL INJECTIONS

## 2019-05-31 DIAGNOSIS — Z01818 Encounter for other preprocedural examination: Secondary | ICD-10-CM | POA: Insufficient documentation

## 2019-05-31 NOTE — Assessment & Plan Note (Signed)
No planned surgery, but he does have planned spinal injection. He is low risk from a cardiac standpoint with no active symptoms. We are stopping Plavix now so would be out of his system by the time his procedure will be done in December.  We are putting him back on baby (81 mg) aspirin which can be held 7 to 9 days preprocedure.

## 2019-05-31 NOTE — Assessment & Plan Note (Signed)
Blood pressure stable today.  Continue current dose of ACE inhibitor.

## 2019-05-31 NOTE — Assessment & Plan Note (Signed)
Very distant history of MI.  Most recent cardiac cath was in 2012.  FFR of the LAD was still considered to be nonischemic.  He is not really having any angina symptoms.  Last stent was many years ago.  We can probably simply stop Plavix and have him go back on maintenance dose daily aspirin.  He is on lisinopril.  Not on beta-blocker because of resting bradycardia.  Statin intolerant.  Is on Zetia.  Adding Nexletol.  Plan: DC Plavix increase aspirin to 81 mg daily. --Okay to hold aspirin for procedures (would hold 7 to 9 days prior to back injection)

## 2019-05-31 NOTE — Assessment & Plan Note (Addendum)
Goal LDL should be less than 70 with his CAD.  He seems to be tolerating Zetia well and LDL was 95 on last check. Plan: We will try Nexletol (we will ask [facility] pharmacist to contact him about potential options for medication assistance)  Recheck labs in about 3 to 4 months.

## 2019-05-31 NOTE — Assessment & Plan Note (Signed)
Continue cardiovascular risk modification.  Blood pressure well controlled.  Lipid control has been decent, but not quite to goal. No interest in stopping his pipe smoking.  Could consider AAA screening evaluation at follow-up visit.

## 2019-06-05 ENCOUNTER — Encounter: Payer: Self-pay | Admitting: Internal Medicine

## 2019-06-05 NOTE — Progress Notes (Signed)
History of Present Illness:      This very nice 83 y.o. MWM presents for 6 month follow up with HTN, ASCAD HLD,  T2_DM and Vitamin D Deficiency.       Patient is treated for HTN (2000)  & BP has been controlled at home. Today's BP is at goal n- 126/62.  In 2011, patient presented with ACS and had PCA w/DES by Dr Ellyn Hack. Patient has done well since & has had no complaints of any cardiac type chest pain, palpitations, dyspnea / orthopnea / PND, dizziness, claudication, or dependent edema.      Patient is Statin Intolerant and his lipids are not at goal with diet & Zetia. Dr Ellyn Hack just Rx'd [facility] on 11/9 which he's not filled yet.   Lab Results  Component Value Date   CHOL 175 03/03/2019   HDL 58 03/03/2019   LDLCALC 95 03/03/2019   TRIG 122 03/03/2019   CHOLHDL 3.0 03/03/2019        Also, the patient has history of T2_NIDDM (2006) /CKD2 and has had no symptoms of reactive hypoglycemia, diabetic polys, paresthesias or visual blurring.  Last A1c was at goal:  Lab Results  Component Value Date   HGBA1C 5.8 (H) 03/03/2019           Further, the patient also has history of Vitamin D Deficiency ("22" / 2008)  and supplements vitamin D without any suspected side-effects. Last vitamin D was slightly elevated & dose was decreased:  Lab Results  Component Value Date   VD25OH 121 (H) 11/24/2018    Current Outpatient Medications on File Prior to Visit  Medication Sig  . aspirin EC 81 MG tablet Take 1 tablet (81 mg total) by mouth daily.  . Bempedoic Acid (NEXLETOL) 180 MG TABS Take 180 mg by mouth daily.  Marland Kitchen CINNAMON PO Take 1,000 mg by mouth daily.   Marland Kitchen doxazosin (CARDURA) 4 MG tablet Take 1 tablet at Bedtime for BP & Prostate  . ezetimibe (ZETIA) 10 MG tablet TAKE 1 TABLET BY MOUTH EVERY DAY  . finasteride (PROSCAR) 5 MG tablet Take 5 mg by mouth daily.  Marland Kitchen lisinopril (ZESTRIL) 5 MG tablet TAKE 1 TABLET BY MOUTH DAILY.  . Magnesium 500 MG CAPS Take 1 capsule by mouth daily.   . metFORMIN (GLUCOPHAGE-XR) 500 MG 24 hr tablet Take 2 tablets 2 x  /day with meals for Diabetes  . neomycin-polymyxin-hydrocortisone (CORTISPORIN) 3.5-10000-1 OTIC suspension Use 6-8 drops to affected ear 3 to 4 x /day  . VITAMIN D PO Take 10,000 Units by mouth daily. Takes 10,000 units three times a week   No current facility-administered medications on file prior to visit.     Allergies  Allergen Reactions  . Atorvastatin Other (See Comments)    MYALGIAS CRAMPING  . Crestor [Rosuvastatin]     MYALGIAS CRAMPING  . Statins     MYALGIAS CRAMPING  . Tetanus Toxoids     UNSPECIFIED REACTION    PMHx:   Past Medical History:  Diagnosis Date  . Arthritis   . BPH (benign prostatic hyperplasia)   . CAD S/P percutaneous coronary angioplasty 12/2009; 08/2010   a) 6/'22 - NSTEMI: PCI to LAD: Promus Element 2.5 mm x 15 mm DES; b) Cath for Angina: Prox LAD 60-70% pre-stent with FFR 0.82, 90% RVM -- Med Rx, EF 50-55%  . Essential hypertension   . Hyperlipidemia with target LDL less than 70    Statin intolerance  .  Non-ST elevation MI (NSTEMI) [facility]) June 2011   PCI to LAD  . Pneumonia    DEC 2016  TX WITH ANTIBIOTIC  . Type 2 diabetes mellitus without complications ([place])    Immunization History  Administered Date(s) Administered  . Influenza, High Dose Seasonal PF 06/01/2013, 04/18/2014, 05/23/2015, 05/20/2016, 04/10/2017, 05/13/2018, 05/10/2019  . Influenza-Unspecified 05/13/2011  . Pneumococcal Conjugate-13 04/18/2014  . Pneumococcal Polysaccharide-23 07/21/2002, 05/23/2015  . Td 07/21/2000    Past Surgical History:  Procedure Laterality Date  . CARDIAC CATHETERIZATION  12/2009   Proximal LAD stenosis followed by a significant 80-90% distal stenosis  . CARDIAC CATHETERIZATION  February 2012    90% ostial RV marginal branch; 60-70% proximal LAD with widely patent distal stent. FFR 0.82; medical therapy  . CATARACT EXTRACTION W/ INTRAOCULAR LENS  IMPLANT, BILATERAL    . CORONARY  ANGIOPLASTY  12/2009   PTCA to proximal LAD; PCI with Promus Element DES stent  2.5 mm x 15 mm  distalLAD - .for non-ST elevation MI  . DENTAL SURGERY    . LUMBAR LAMINECTOMY/DECOMPRESSION MICRODISCECTOMY N/A 08/09/2015   Procedure: Lumbar Four-Five, Lumbar Five- Sacral One Decompressive Lumbar Laminectomy with Resection of Synovial Cyst;  Surgeon: Erline Levine, MD;  Location: [place] [facility];  Service: Neurosurgery;  Laterality: N/A;  L4 to S1 Decompressive Lumbar Laminectomy  . LUMBAR LAMINECTOMY/DECOMPRESSION MICRODISCECTOMY Right 01/21/2016   Procedure: Redo Right Lumbar Five-Sacral One Laminectomy for synovial cyst;  Surgeon: Erline Levine, MD;  Location: [place] [facility];  Service: Neurosurgery;  Laterality: Right;  right  . LUMBAR LAMINECTOMY/DECOMPRESSION MICRODISCECTOMY Right 10/08/2017   Procedure: Right Lumbar three-four Laminectomy for synovial cyst;  Surgeon: Erline Levine, MD;  Location: [place];  Service: Neurosurgery;  Laterality: Right;  . NASAL FRACTURE SURGERY    . TRANSTHORACIC ECHOCARDIOGRAM  June 2011   - (EF not reported) Moderately dilated LV; moderate hypokinesis of anteroseptal and anterior wall consistent with MI. Grade 1 diastolic dysfunction. Mild to moderately dilated LA  . VASECTOMY     FHx:    Reviewed / unchanged  SHx:    Reviewed / unchanged   Systems Review:  Constitutional: Denies fever, chills, wt changes, headaches, insomnia, fatigue, night sweats, change in appetite. Eyes: Denies redness, blurred vision, diplopia, discharge, itchy, watery eyes.  ENT: Denies discharge, congestion, post nasal drip, epistaxis, sore throat, earache, hearing loss, dental pain, tinnitus, vertigo, sinus pain, snoring.  CV: Denies chest pain, palpitations, irregular heartbeat, syncope, dyspnea, diaphoresis, orthopnea, PND, claudication or edema. Respiratory: denies cough, dyspnea, DOE, pleurisy, hoarseness, laryngitis, wheezing.  Gastrointestinal: Denies dysphagia, odynophagia, heartburn,  reflux, water brash, abdominal pain or cramps, nausea, vomiting, bloating, diarrhea, constipation, hematemesis, melena, hematochezia  or hemorrhoids. Genitourinary: Denies dysuria, frequency, urgency, nocturia, hesitancy, discharge, hematuria or flank pain. Musculoskeletal: Denies arthralgias, myalgias, stiffness, jt. swelling, pain, limping or strain/sprain.  Skin: Denies pruritus, rash, hives, warts, acne, eczema or change in skin lesion(s). Neuro: No weakness, tremor, incoordination, spasms, paresthesia or pain. Psychiatric: Denies confusion, memory loss or sensory loss. Endo: Denies change in weight, skin or hair change.  Heme/Lymph: No excessive bleeding, bruising or enlarged lymph nodes.  Physical Exam  BP 126/62   Pulse 64   Temp (!) 97.2 F (36.2 C)   Resp 16   Ht 5\' 10"  (1.778 m)   Wt 192 lb 8 oz (87.3 kg)   BMI 27.62 kg/m   Appears  well nourished, well groomed  and in no distress.  Eyes: PERRLA, EOMs, conjunctiva no swelling or erythema. Sinuses: No  frontal/maxillary tenderness ENT/Mouth: EAC's clear, TM's nl w/o erythema, bulging. Nares clear w/o erythema, swelling, exudates. Oropharynx clear without erythema or exudates. Oral hygiene is good. Tongue normal, non obstructing. Hearing intact.  Neck: Supple. Thyroid not palpable. Car 2+/2+ without bruits, nodes or JVD. Chest: Respirations nl with BS clear & equal w/o rales, rhonchi, wheezing or stridor.  Cor: Heart sounds normal w/ regular rate and rhythm without sig. murmurs, gallops, clicks or rubs. Peripheral pulses normal and equal  without edema.  Abdomen: Soft & bowel sounds normal. Non-tender w/o guarding, rebound, hernias, masses or organomegaly.  Lymphatics: Unremarkable.  Musculoskeletal: Full ROM all peripheral extremities, joint stability, 5/5 strength and normal gait.  Skin: Warm, dry without exposed rashes, lesions or ecchymosis apparent.  Neuro: Cranial nerves intact, reflexes equal bilaterally. Sensory-motor  testing grossly intact. Tendon reflexes grossly intact.  Pysch: Alert & oriented x 3.  Insight and judgement nl & appropriate. No ideations.  Assessment and Plan:  1. Essential hypertension  - CBC with Diff - COMPLETE METABOLIC PANEL WITH GFR - Magnesium - TSH  2. Hyperlipidemia associated with type 2 diabetes mellitus ([place])  - Continue diet/meds, exercise,& lifestyle modifications.  - Continue monitor periodic cholesterol/liver & renal functions   - Lipid profile deferred today as patient only on Nexletol a short period of time - TSH  3. Type 2 diabetes mellitus with stage 2 chronic kidney disease, without long-term current use of insulin (HCC)  - Continue diet, exercise  - Lifestyle modifications.  - Monitor appropriate labs.  - Hemoglobin A1c ([facility]) - Insulin, random  4. Vitamin D deficiency  - Continue supplementation.  - Vitamin D (25 hydroxy)  5. Coronary artery disease involving native coronary artery of  native heart without angina pectoris  6. Medication management  - CBC with Diff - COMPLETE METABOLIC PANEL WITH GFR - Magnesium - TSH - Hemoglobin A1c ([facility]) - Insulin, random - Vitamin D (25 hydroxy)       Discussed  regular exercise, BP monitoring, weight control to achieve/maintain BMI less than 25 and discussed med and SE's. Recommended labs to assess and monitor clinical status with further disposition pending results of labs.  I discussed the assessment and treatment plan with the patient. The patient was provided an opportunity to ask questions and all were answered. The patient agreed with the plan and demonstrated an understanding of the instructions.  I provided over 30 minutes of exam, counseling, chart review and  complex critical decision making.  Kirtland Bouchard, MD

## 2019-06-05 NOTE — Patient Instructions (Signed)

## 2019-06-06 ENCOUNTER — Encounter: Payer: Self-pay | Admitting: Internal Medicine

## 2019-06-06 ENCOUNTER — Ambulatory Visit (INDEPENDENT_AMBULATORY_CARE_PROVIDER_SITE_OTHER): Payer: Medicare Other | Admitting: Internal Medicine

## 2019-06-06 ENCOUNTER — Other Ambulatory Visit: Payer: Self-pay

## 2019-06-06 VITALS — BP 126/62 | HR 64 | Temp 97.2°F | Resp 16 | Ht 70.0 in | Wt 192.5 lb

## 2019-06-06 DIAGNOSIS — E1122 Type 2 diabetes mellitus with diabetic chronic kidney disease: Secondary | ICD-10-CM | POA: Diagnosis not present

## 2019-06-06 DIAGNOSIS — E559 Vitamin D deficiency, unspecified: Secondary | ICD-10-CM | POA: Diagnosis not present

## 2019-06-06 DIAGNOSIS — E1169 Type 2 diabetes mellitus with other specified complication: Secondary | ICD-10-CM

## 2019-06-06 DIAGNOSIS — Z9861 Coronary angioplasty status: Secondary | ICD-10-CM

## 2019-06-06 DIAGNOSIS — E785 Hyperlipidemia, unspecified: Secondary | ICD-10-CM

## 2019-06-06 DIAGNOSIS — I10 Essential (primary) hypertension: Secondary | ICD-10-CM | POA: Diagnosis not present

## 2019-06-06 DIAGNOSIS — I2510 Atherosclerotic heart disease of native coronary artery without angina pectoris: Secondary | ICD-10-CM

## 2019-06-06 DIAGNOSIS — Z79899 Other long term (current) drug therapy: Secondary | ICD-10-CM | POA: Diagnosis not present

## 2019-06-06 DIAGNOSIS — N182 Chronic kidney disease, stage 2 (mild): Secondary | ICD-10-CM

## 2019-06-06 MED ORDER — FLUCONAZOLE 150 MG PO TABS: ORAL_TABLET | ORAL | 3 refills | Status: DC

## 2019-06-07 LAB — COMPLETE METABOLIC PANEL WITH GFR
AG Ratio: 1.7 (calc) (ref 1.0–2.5)
ALT: 14 U/L (ref 9–46)
AST: 14 U/L (ref 10–35)
Albumin: 4 g/dL (ref 3.6–5.1)
Alkaline phosphatase (APISO): 53 U/L (ref 35–144)
BUN: 13 mg/dL (ref 7–25)
CO2: 30 mmol/L (ref 20–32)
Calcium: 9.8 mg/dL (ref 8.6–10.3)
Chloride: 102 mmol/L (ref 98–110)
Creat: 0.77 mg/dL (ref 0.70–1.11)
GFR, Est African American: 99 mL/min/{1.73_m2} (ref >=60)
GFR, Est Non African American: 85 mL/min/{1.73_m2} (ref >=60)
Globulin: 2.3 g/dL (calc) (ref 1.9–3.7)
Glucose, Bld: 121 mg/dL — ABNORMAL HIGH (ref 65–99)
Potassium: 4 mmol/L (ref 3.5–5.3)
Sodium: 139 mmol/L (ref 135–146)
Total Bilirubin: 0.6 mg/dL (ref 0.2–1.2)
Total Protein: 6.3 g/dL (ref 6.1–8.1)

## 2019-06-07 LAB — CBC WITH DIFFERENTIAL/PLATELET
Absolute Monocytes: 728 cells/uL (ref 200–950)
Basophils Absolute: 60 cells/uL (ref 0–200)
Basophils Relative: 0.8 %
Eosinophils Absolute: 353 cells/uL (ref 15–500)
Eosinophils Relative: 4.7 %
HCT: 43.7 % (ref 38.5–50.0)
Hemoglobin: 14.4 g/dL (ref 13.2–17.1)
Lymphs Abs: 1448 cells/uL (ref 850–3900)
MCH: 31 pg (ref 27.0–33.0)
MCHC: 33 g/dL (ref 32.0–36.0)
MCV: 94.2 fL (ref 80.0–100.0)
MPV: 10.9 fL (ref 7.5–12.5)
Monocytes Relative: 9.7 %
Neutro Abs: 4913 cells/uL (ref 1500–7800)
Neutrophils Relative %: 65.5 %
Platelets: 223 10*3/uL (ref 140–400)
RBC: 4.64 10*6/uL (ref 4.20–5.80)
RDW: 11.9 % (ref 11.0–15.0)
Total Lymphocyte: 19.3 %
WBC: 7.5 10*3/uL (ref 3.8–10.8)

## 2019-06-07 LAB — INSULIN, RANDOM: Insulin: 22.3 u[IU]/mL — ABNORMAL HIGH

## 2019-06-07 LAB — HEMOGLOBIN A1C
Hgb A1c MFr Bld: 5.6 % of total Hgb (ref <5.7)
Mean Plasma Glucose: 114 (calc)
eAG (mmol/L): 6.3 (calc)

## 2019-06-07 LAB — VITAMIN D 25 HYDROXY (VIT D DEFICIENCY, FRACTURES): Vit D, 25-Hydroxy: 72 ng/mL (ref 30–100)

## 2019-06-07 LAB — MAGNESIUM: Magnesium: 2.1 mg/dL (ref 1.5–2.5)

## 2019-06-07 LAB — TSH: TSH: 2 mIU/L (ref 0.40–4.50)

## 2019-06-14 ENCOUNTER — Telehealth: Payer: Self-pay | Admitting: Cardiology

## 2019-06-14 NOTE — Telephone Encounter (Signed)
   Primary Cardiologist: Glenetta Hew, MD  Chart reviewed as part of pre-operative protocol coverage. Given past medical history and time since last visit, based on ACC/AHA guidelines, David [name] would be at acceptable risk for the planned procedure without further cardiovascular testing.   Per Dr. Allison Quarry office note on 05/30/2019: Preoperative clearance     No planned surgery, but he does have planned spinal injection. He is low risk from a cardiac standpoint with no active symptoms. We are stopping Plavix now so would be out of his system by the time his procedure will be done in December.  We are putting him back on baby (81 mg) aspirin which can be held 7 to 9 days preprocedure.       I will route this recommendation to the requesting party via Epic fax function and remove from pre-op pool.  Please call with questions.  Daune Perch, NP 06/14/2019, 4:38 PM

## 2019-06-14 NOTE — Telephone Encounter (Signed)
Javanna from [facility] calling to get this patient's surgical clearance faxed to her attention at [phone].

## 2019-08-01 ENCOUNTER — Other Ambulatory Visit: Payer: Self-pay | Admitting: Internal Medicine

## 2019-09-05 DIAGNOSIS — M5416 Radiculopathy, lumbar region: Secondary | ICD-10-CM | POA: Diagnosis not present

## 2019-09-06 ENCOUNTER — Ambulatory Visit: Payer: Medicare Other | Admitting: Adult Health Nurse Practitioner

## 2019-09-07 DIAGNOSIS — M545 Low back pain: Secondary | ICD-10-CM | POA: Diagnosis not present

## 2019-09-07 DIAGNOSIS — M48062 Spinal stenosis, lumbar region with neurogenic claudication: Secondary | ICD-10-CM | POA: Diagnosis not present

## 2019-09-07 DIAGNOSIS — M5416 Radiculopathy, lumbar region: Secondary | ICD-10-CM | POA: Diagnosis not present

## 2019-09-07 DIAGNOSIS — M4316 Spondylolisthesis, lumbar region: Secondary | ICD-10-CM | POA: Diagnosis not present

## 2019-09-07 DIAGNOSIS — M7130 Other bursal cyst, unspecified site: Secondary | ICD-10-CM | POA: Diagnosis not present

## 2019-09-08 ENCOUNTER — Ambulatory Visit: Payer: Medicare Other | Admitting: Adult Health Nurse Practitioner

## 2019-09-09 ENCOUNTER — Ambulatory Visit: Payer: Medicare Other | Admitting: Adult Health Nurse Practitioner

## 2019-09-15 DIAGNOSIS — E1169 Type 2 diabetes mellitus with other specified complication: Secondary | ICD-10-CM | POA: Diagnosis not present

## 2019-09-15 DIAGNOSIS — Z9861 Coronary angioplasty status: Secondary | ICD-10-CM | POA: Diagnosis not present

## 2019-09-15 DIAGNOSIS — E785 Hyperlipidemia, unspecified: Secondary | ICD-10-CM | POA: Diagnosis not present

## 2019-09-15 DIAGNOSIS — I2510 Atherosclerotic heart disease of native coronary artery without angina pectoris: Secondary | ICD-10-CM | POA: Diagnosis not present

## 2019-09-16 LAB — COMPREHENSIVE METABOLIC PANEL
ALT: 10 IU/L (ref 0–44)
AST: 15 IU/L (ref 0–40)
Albumin/Globulin Ratio: 1.7 (ref 1.2–2.2)
Albumin: 4.3 g/dL (ref 3.6–4.6)
Alkaline Phosphatase: 57 IU/L (ref 39–117)
BUN/Creatinine Ratio: 13 (ref 10–24)
BUN: 12 mg/dL (ref 8–27)
Bilirubin Total: 0.6 mg/dL (ref 0.0–1.2)
CO2: 24 mmol/L (ref 20–29)
Calcium: 9.9 mg/dL (ref 8.6–10.2)
Chloride: 102 mmol/L (ref 96–106)
Creatinine, Ser: 0.96 mg/dL (ref 0.76–1.27)
GFR calc Af Amer: 85 mL/min/{1.73_m2} (ref >59)
GFR calc non Af Amer: 74 mL/min/{1.73_m2} (ref >59)
Globulin, Total: 2.6 g/dL (ref 1.5–4.5)
Glucose: 85 mg/dL (ref 65–99)
Potassium: 4.6 mmol/L (ref 3.5–5.2)
Sodium: 141 mmol/L (ref 134–144)
Total Protein: 6.9 g/dL (ref 6.0–8.5)

## 2019-09-16 LAB — LIPID PANEL
Chol/HDL Ratio: 2.3 ratio (ref 0.0–5.0)
Cholesterol, Total: 143 mg/dL (ref 100–199)
HDL: 61 mg/dL (ref >39)
LDL Chol Calc (NIH): 68 mg/dL (ref 0–99)
Triglycerides: 71 mg/dL (ref 0–149)
VLDL Cholesterol Cal: 14 mg/dL (ref 5–40)

## 2019-09-28 NOTE — Progress Notes (Signed)
MEDICARE ANNUAL WELLNESS VISIT AND FOLLOW UP Assessment:   [name] was seen today for follow-up.  Diagnoses and all orders for this visit:  Encounter for Medicare annual wellness exam -Yearly  Essential hypertension Continue current medications: lisinopril 5mg  daily Monitor blood pressure at home; call if consistently over 130/80 Continue DASH diet.   Reminder to go to the ER if any CP, SOB, nausea, dizziness, severe HA, changes vision/speech, left arm numbness and tingling and jaw pain. -     CBC with Differential/Platelet -     COMPLETE METABOLIC PANEL WITH GFR  Hyperlipidemia associated with type 2 diabetes mellitus (HCC) Continue medications: Zetia 10mg  daily and nextletol Discussed dietary and exercise modifications Low fat diet -     Lipid panel  Type 2 diabetes mellitus with stage 2 chronic kidney disease, without long-term current use of insulin (HCC) Continue medications: Metformin 500mg XL two tablets twice a day. Discussed general issues about diabetes pathophysiology and management. Education: Reviewed 'ABCs' of diabetes management (respective goals in parentheses):  A1C (<7), blood pressure (<130/80), and cholesterol (LDL <70) Dietary recommendations Encouraged aerobic exercise.  Discussed foot care, check daily Yearly retinal exam Dental exam every 6 months Monitor blood glucose, discussed goal for patient -     Hemoglobin A1c  Vitamin D deficiency Continue supplementation Taking Vitamin D 10,000 IU three times a week.  CAD S/P percutaneous coronary angioplasty and PCI- DES mid LAD in the setting of non-STEMI  Control blood pressure, lipids and glucose Disscused lifestyle modifications, diet & exercise Continue to monitor Followed by Cardiology  Type II diabetes mellitus with nephropathy ([place]) Doing well at this time Continue to monitor  Aortic atherosclerosis (HCC) Control blood pressure, lipids and glucose Disscused lifestyle modifications, diet &  exercise Continue to monitor  Diabetic retinopathy of both eyes associated with type 2 diabetes mellitus, macular edema presence unspecified, unspecified retinopathy severity (HCC) Aggressively control sugars and BP Followed by ophthalmology  BPH with obstruction/lower urinary tract symptoms Doing well at this time Continue medications: Cardura 4mg , finasteride 5mg . Will continue to monitor Defer PSA this check  Senile purpura ([place])  Discussed process, protect skin, sunscreen  Tobacco dependency Discussed tobacco cessation Not ready to quit at this time  Overweight (BMI 25.0-29.9) - continue medications, stress management techniques discussed, increase water, good sleep hygiene discussed, increase exercise, and increase veggies.   Medication management Continued   Over 40 minutes of face to face interview, exam, counseling, chart review, and critical decision making was performed  Future Appointments  Date Time Provider [place]  01/04/2020 11:00 AM [name], MD [facility] None  10/09/2020 11:15 AM Garnet Sierras, NP [facility] None     Plan:   During the course of the visit the patient was educated and counseled about appropriate screening and preventive services including:    Pneumococcal vaccine   Influenza vaccine  Prevnar 13  Td vaccine  Screening electrocardiogram  Colorectal cancer screening  Diabetes screening  Glaucoma screening  Nutrition counseling    Subjective:  David [name] is a 84 y.o. male who presents for Medicare Annual Wellness Visit and 3 month follow up for HTN, hyperlipidemia, T2 diabetes, and vitamin D Def.   Patient had back surgery in 09/2017 by Dr. Vertell Limber to have a cyst removed.  Reports he does still have some pain in his R leg, but has sensation back.   BMI is Body mass index is 27.26 kg/m., he has been working on diet, exercise limited. He watches bread and sugar  intake.  Wt Readings from Last 3  Encounters:  09/29/19 190 lb (86.2 kg)  06/06/19 192 lb 8 oz (87.3 kg)  05/30/19 194 lb (88 kg)   His blood pressure has been controlled at home, today their BP is BP: 114/66 He does workout, but limited. He denies chest pain, shortness of breath, dizziness.  Smokes pipe daily. He is not ready to cut back or quit.   He is on cholesterol medication (zetia due to statin intolerance) and nextletol denies myalgias. His cholesterol is at goal. The cholesterol last visit was:   Lab Results  Component Value Date   CHOL 143 09/15/2019   HDL 61 09/15/2019   LDLCALC 68 09/15/2019   TRIG 71 09/15/2019   CHOLHDL 2.3 09/15/2019   He has been working on diet and exercise for T2 diabetes controlled on metformin, and denies foot ulcerations, hyperglycemia, hypoglycemia , increased appetite, nausea, paresthesia of the feet, polydipsia, polyuria, visual disturbances, vomiting and weight loss. Last A1C in the office was:  Lab Results  Component Value Date   HGBA1C 5.6 06/06/2019   Last GFR Lab Results  Component Value Date   GFRNONAA 74 09/15/2019    Patient is on Vitamin D supplement.   Lab Results  Component Value Date   VD25OH 72 06/06/2019        Medication Review: Current Outpatient Medications on File Prior to Visit  Medication Sig Dispense Refill  . aspirin EC 81 MG tablet Take 1 tablet (81 mg total) by mouth daily. 30 tablet 11  . Bempedoic Acid (NEXLETOL) 180 MG TABS Take 180 mg by mouth daily. 30 tablet 6  . CINNAMON PO Take 1,000 mg by mouth daily.     Marland Kitchen doxazosin (CARDURA) 4 MG tablet Take 1 tablet at Bedtime for BP & Prostate 90 tablet 3  . ezetimibe (ZETIA) 10 MG tablet TAKE 1 TABLET BY MOUTH EVERY DAY 90 tablet 1  . finasteride (PROSCAR) 5 MG tablet Take 5 mg by mouth daily.    Marland Kitchen lisinopril (ZESTRIL) 5 MG tablet TAKE 1 TABLET BY MOUTH DAILY. 90 tablet 3  . Magnesium 500 MG CAPS Take 1 capsule by mouth daily.    . metFORMIN (GLUCOPHAGE-XR) 500 MG 24 hr tablet TAKE 2 TABLETS  TWICE A DAY WITH MEALS FOR DIABETES 360 tablet 3  . neomycin-polymyxin-hydrocortisone (CORTISPORIN) 3.5-10000-1 OTIC suspension Use 6-8 drops to affected ear 3 to 4 x /day 10 mL 3  . VITAMIN D PO Take 10,000 Units by mouth daily. Takes 10,000 units three times a week    . fluconazole (DIFLUCAN) 150 MG tablet Take 1 tablet 2 x /week  if needed for yeast infection 8 tablet 3   No current facility-administered medications on file prior to visit.    Current Problems (verified) Patient Active Problem List   Diagnosis Date Noted  . Preoperative clearance 05/31/2019  . Retinopathy, diabetic, bilateral ([place]) 02/01/2018  . Aortic atherosclerosis ([place]) 01/08/2017  . Synovial cyst of lumbar facet joint 08/09/2015  . BPH (benign prostatic hyperplasia) 11/16/2014  . Vitamin D deficiency 12/25/2013  . Medication management 12/25/2013  . Overweight (BMI 25.0-29.9) 02/23/2013    Class: Chronic  . Tobacco dependency 02/22/2013  . Hyperlipidemia associated with type 2 diabetes mellitus ([place])   . Essential hypertension   . Type II diabetes mellitus with nephropathy ([place])   . CAD S/P percutaneous coronary angioplasty and PCI- DES mid LAD in the setting of non-STEMI 12/31/2009    Screening Tests Immunization History  Administered Date(s) Administered  . Influenza, High Dose Seasonal PF 06/01/2013, 04/18/2014, 05/23/2015, 05/20/2016, 04/10/2017, 05/13/2018, 05/10/2019  . Influenza-Unspecified 05/13/2011  . PFIZER SARS-COV-2 Vaccination 08/17/2019, 09/19/2019  . Pneumococcal Conjugate-13 04/18/2014  . Pneumococcal Polysaccharide-23 07/21/2002, 05/23/2015  . Td 07/21/2000   Preventative care: Last colonoscopy: 2015  Prior vaccinations: TD or Tdap: 2002, allergy to TDAP/TD Influenza: 2018  Pneumococcal: 2016 Prevnar13: 2015 Shingles/Zostavax: Declined   Goes to ENT  Names of Other Physician/Practitioners you currently use: 1. [facility] here for  primary care 2. Dr. Herbert Deaner, eye doctor, last visit Due for 2021- report verified and abstracted 3. Judeth Horn, dentist, Due for 2021  Patient Care Team: [name], MD as PCP - General (Internal Medicine) Leonie Man, MD as PCP - Cardiology (Cardiology) Laurence Spates, MD (Inactive) as Consulting Physician (Gastroenterology) Leonie Man, MD as Consulting Physician (Cardiology) Erline Levine, MD as Consulting Physician (Neurosurgery) Carolan Clines, MD (Inactive) as Consulting Physician (Urology)  Allergies Allergies  Allergen Reactions  . Atorvastatin Other (See Comments)    MYALGIAS CRAMPING  . Crestor [Rosuvastatin]     MYALGIAS CRAMPING  . Statins     MYALGIAS CRAMPING  . Tetanus Toxoids     UNSPECIFIED REACTION     SURGICAL HISTORY He  has a past surgical history that includes Cardiac catheterization (12/2009); Coronary angioplasty (12/2009); Cardiac catheterization (February 2012 ); Vasectomy; transthoracic echocardiogram (June 2011); Cataract extraction w/ intraocular lens  implant, bilateral; Dental surgery; Lumbar laminectomy/decompression microdiscectomy (N/A, 08/09/2015); Nasal fracture surgery; Lumbar laminectomy/decompression microdiscectomy (Right, 01/21/2016); and Lumbar laminectomy/decompression microdiscectomy (Right, 10/08/2017). FAMILY HISTORY His family history includes Cancer in his brother, father, sister, and son; Stroke in his father. SOCIAL HISTORY He  reports that he has been smoking pipe. He has smoked for the past 60.00 years. He has never used smokeless tobacco. He reports current alcohol use of about 7.0 standard drinks of alcohol per week. He reports that he does not use drugs.  MEDICARE WELLNESS OBJECTIVES: Tobacco use: He does smoke.  counseling given, no intention of quitting Alcohol Current alcohol use: 4 oz of vodka Diet: well balanced Physical activity: Current Exercise Habits: The patient does not participate in regular exercise at  present, Exercise limited by: cardiac condition(s) Cardiac risk factors: Cardiac Risk Factors include: advanced age (>70men, >61 women);male gender;dyslipidemia;hypertension;smoking/ tobacco exposure;sedentary lifestyle;diabetes mellitus Depression/mood screen:   Depression screen [facility] 2/9 09/29/2019  Decreased Interest 0  Down, Depressed, Hopeless 0  PHQ - 2 Score 0    ADLs:  In your present state of health, do you have any difficulty performing the following activities: 09/29/2019 06/05/2019  Hearing? N N  Comment - -  Vision? N N  Difficulty concentrating or making decisions? N N  Walking or climbing stairs? N N  Dressing or bathing? N N  Doing errands, shopping? N -  Preparing Food and eating ? N -  Using the Toilet? N -  In the past six months, have you accidently leaked urine? N -  Do you have problems with loss of bowel control? N -  Managing your Medications? N -  Managing your Finances? N -  Housekeeping or managing your Housekeeping? N -  Some recent data might be hidden     Cognitive Testing  Alert? Yes  Normal Appearance?Yes  Oriented to person? Yes  Place? Yes   Time? Yes  Recall of three objects?  Yes  Can perform simple calculations? Yes  Displays appropriate judgment?Yes  Can read the correct  time from a watch face?Yes  EOL planning:     Objective:   Today's Vitals   09/29/19 1137  BP: 114/66  Pulse: (!) 57  Temp: (!) 97.3 F (36.3 C)  Weight: 190 lb (86.2 kg)  Height: 5\' 10"  (1.778 m)  PF: 96 L/min  PainSc: 2   PainLoc: Leg   Body mass index is 27.26 kg/m.  General appearance: alert, no distress, WD/WN, male HEENT: normocephalic, sclerae anicteric, TMs pearly, nares patent, no discharge or erythema, pharynx normal Oral cavity: MMM, no lesions Neck: supple, no lymphadenopathy, no thyromegaly, no masses Heart: RRR, normal S1, S2, no murmurs Lungs: CTA bilaterally, no wheezes, rhonchi, or rales Abdomen: +bs, soft, non tender, non distended, no  masses, no hepatomegaly, no splenomegaly Musculoskeletal: nontender, no swelling, no obvious deformity Extremities: no edema, no cyanosis, no clubbing Pulses: 2+ symmetric, upper and lower extremities, normal cap refill Neurological: alert, oriented x 3, CN2-12 intact, strength normal upper extremities and lower extremities, sensation normal throughout, DTRs 2+ throughout, no cerebellar signs, gait slow but steady Psychiatric: normal affect, behavior normal, pleasant   Medicare Attestation I have personally reviewed: The patient's medical and social history Their use of alcohol, tobacco or illicit drugs Their current medications and supplements The patient's functional ability including ADLs,fall risks, home safety risks, cognitive, and hearing and visual impairment Diet and physical activities Evidence for depression or mood disorders  The patient's weight, height, BMI, and visual acuity have been recorded in the chart.  I have made referrals, counseling, and provided education to the patient based on review of the above and I have provided the patient with a written personalized care plan for preventive services.     Garnet Sierras, NP   09/29/2019

## 2019-09-29 ENCOUNTER — Ambulatory Visit (INDEPENDENT_AMBULATORY_CARE_PROVIDER_SITE_OTHER): Payer: Medicare Other | Admitting: Adult Health Nurse Practitioner

## 2019-09-29 ENCOUNTER — Encounter: Payer: Self-pay | Admitting: Adult Health Nurse Practitioner

## 2019-09-29 ENCOUNTER — Other Ambulatory Visit: Payer: Self-pay

## 2019-09-29 VITALS — BP 114/66 | HR 57 | Temp 97.3°F | Ht 70.0 in | Wt 190.0 lb

## 2019-09-29 DIAGNOSIS — N138 Other obstructive and reflux uropathy: Secondary | ICD-10-CM

## 2019-09-29 DIAGNOSIS — E663 Overweight: Secondary | ICD-10-CM | POA: Diagnosis not present

## 2019-09-29 DIAGNOSIS — Z0001 Encounter for general adult medical examination with abnormal findings: Secondary | ICD-10-CM | POA: Diagnosis not present

## 2019-09-29 DIAGNOSIS — R6889 Other general symptoms and signs: Secondary | ICD-10-CM

## 2019-09-29 DIAGNOSIS — E1169 Type 2 diabetes mellitus with other specified complication: Secondary | ICD-10-CM | POA: Diagnosis not present

## 2019-09-29 DIAGNOSIS — I700 Atherosclerosis of aorta: Secondary | ICD-10-CM | POA: Diagnosis not present

## 2019-09-29 DIAGNOSIS — D692 Other nonthrombocytopenic purpura: Secondary | ICD-10-CM | POA: Diagnosis not present

## 2019-09-29 DIAGNOSIS — Z79899 Other long term (current) drug therapy: Secondary | ICD-10-CM | POA: Diagnosis not present

## 2019-09-29 DIAGNOSIS — I10 Essential (primary) hypertension: Secondary | ICD-10-CM | POA: Diagnosis not present

## 2019-09-29 DIAGNOSIS — N182 Chronic kidney disease, stage 2 (mild): Secondary | ICD-10-CM | POA: Diagnosis not present

## 2019-09-29 DIAGNOSIS — Z9861 Coronary angioplasty status: Secondary | ICD-10-CM

## 2019-09-29 DIAGNOSIS — E559 Vitamin D deficiency, unspecified: Secondary | ICD-10-CM | POA: Diagnosis not present

## 2019-09-29 DIAGNOSIS — I2510 Atherosclerotic heart disease of native coronary artery without angina pectoris: Secondary | ICD-10-CM | POA: Diagnosis not present

## 2019-09-29 DIAGNOSIS — E1122 Type 2 diabetes mellitus with diabetic chronic kidney disease: Secondary | ICD-10-CM

## 2019-09-29 DIAGNOSIS — E785 Hyperlipidemia, unspecified: Secondary | ICD-10-CM | POA: Diagnosis not present

## 2019-09-29 DIAGNOSIS — E11319 Type 2 diabetes mellitus with unspecified diabetic retinopathy without macular edema: Secondary | ICD-10-CM | POA: Diagnosis not present

## 2019-09-29 DIAGNOSIS — Z0000 Encounter for general adult medical examination without abnormal findings: Secondary | ICD-10-CM

## 2019-09-29 DIAGNOSIS — F17200 Nicotine dependence, unspecified, uncomplicated: Secondary | ICD-10-CM | POA: Diagnosis not present

## 2019-09-29 DIAGNOSIS — E1121 Type 2 diabetes mellitus with diabetic nephropathy: Secondary | ICD-10-CM | POA: Diagnosis not present

## 2019-09-29 DIAGNOSIS — N401 Enlarged prostate with lower urinary tract symptoms: Secondary | ICD-10-CM | POA: Diagnosis not present

## 2019-09-29 NOTE — Patient Instructions (Addendum)
    David [name] , Thank you for taking time to come for your Medicare Wellness Visit. I appreciate your ongoing commitment to your health goals. Please review the following plan we discussed and let me know if I can assist you in the future.   These are the goals we discussed: Goals   -  Increase water intake  - Stop smoking    This is a list of the screening recommended for you and due dates:  Health Maintenance  Topic Date Due  . Tetanus Vaccine  06/27/2020*  . Complete foot exam   11/24/2019  . Eye exam for diabetics  11/24/2019  . Hemoglobin A1C  12/04/2019  . Flu Shot  Completed  . Pneumonia vaccines  Completed  *Topic was postponed. The date shown is not the original due date.     Try B12 sublingual tablet that dissolves in your mouth.  Sublingual is more important than the dose.  This will help with enegy and cognition.  Consider strengthening exercises for your right leg to help with stability and decrease risk of falls.   We will contact you in 1-2 days with your lab results via [facility].

## 2019-09-30 LAB — CBC WITH DIFFERENTIAL/PLATELET
Absolute Monocytes: 924 cells/uL (ref 200–950)
Basophils Absolute: 63 cells/uL (ref 0–200)
Basophils Relative: 0.9 %
Eosinophils Absolute: 301 cells/uL (ref 15–500)
Eosinophils Relative: 4.3 %
HCT: 43.9 % (ref 38.5–50.0)
Hemoglobin: 14.6 g/dL (ref 13.2–17.1)
Lymphs Abs: 1596 cells/uL (ref 850–3900)
MCH: 31.3 pg (ref 27.0–33.0)
MCHC: 33.3 g/dL (ref 32.0–36.0)
MCV: 94 fL (ref 80.0–100.0)
MPV: 11 fL (ref 7.5–12.5)
Monocytes Relative: 13.2 %
Neutro Abs: 4116 cells/uL (ref 1500–7800)
Neutrophils Relative %: 58.8 %
Platelets: 267 10*3/uL (ref 140–400)
RBC: 4.67 10*6/uL (ref 4.20–5.80)
RDW: 12.5 % (ref 11.0–15.0)
Total Lymphocyte: 22.8 %
WBC: 7 10*3/uL (ref 3.8–10.8)

## 2019-09-30 LAB — LIPID PANEL
Cholesterol: 143 mg/dL (ref <200)
HDL: 52 mg/dL (ref >=40)
LDL Cholesterol (Calc): 74 mg/dL (calc)
Non-HDL Cholesterol (Calc): 91 mg/dL (calc) (ref <130)
Total CHOL/HDL Ratio: 2.8 (calc) (ref <5.0)
Triglycerides: 90 mg/dL (ref <150)

## 2019-09-30 LAB — COMPLETE METABOLIC PANEL WITH GFR
AG Ratio: 1.6 (calc) (ref 1.0–2.5)
ALT: 10 U/L (ref 9–46)
AST: 15 U/L (ref 10–35)
Albumin: 4.1 g/dL (ref 3.6–5.1)
Alkaline phosphatase (APISO): 46 U/L (ref 35–144)
BUN: 16 mg/dL (ref 7–25)
CO2: 29 mmol/L (ref 20–32)
Calcium: 9.8 mg/dL (ref 8.6–10.3)
Chloride: 103 mmol/L (ref 98–110)
Creat: 0.86 mg/dL (ref 0.70–1.11)
GFR, Est African American: 94 mL/min/{1.73_m2} (ref >=60)
GFR, Est Non African American: 81 mL/min/{1.73_m2} (ref >=60)
Globulin: 2.5 g/dL (calc) (ref 1.9–3.7)
Glucose, Bld: 80 mg/dL (ref 65–99)
Potassium: 4.6 mmol/L (ref 3.5–5.3)
Sodium: 139 mmol/L (ref 135–146)
Total Bilirubin: 0.7 mg/dL (ref 0.2–1.2)
Total Protein: 6.6 g/dL (ref 6.1–8.1)

## 2019-09-30 LAB — HEMOGLOBIN A1C
Hgb A1c MFr Bld: 5.7 % of total Hgb — ABNORMAL HIGH (ref <5.7)
Mean Plasma Glucose: 117 (calc)
eAG (mmol/L): 6.5 (calc)

## 2019-10-13 ENCOUNTER — Other Ambulatory Visit: Payer: Self-pay | Admitting: Physician Assistant

## 2019-10-13 ENCOUNTER — Ambulatory Visit: Payer: Medicare Other | Admitting: Adult Health Nurse Practitioner

## 2019-11-03 DIAGNOSIS — L209 Atopic dermatitis, unspecified: Secondary | ICD-10-CM | POA: Diagnosis not present

## 2019-11-03 DIAGNOSIS — N400 Enlarged prostate without lower urinary tract symptoms: Secondary | ICD-10-CM | POA: Diagnosis not present

## 2019-11-25 DIAGNOSIS — E119 Type 2 diabetes mellitus without complications: Secondary | ICD-10-CM | POA: Diagnosis not present

## 2019-11-25 DIAGNOSIS — H35362 Drusen (degenerative) of macula, left eye: Secondary | ICD-10-CM | POA: Diagnosis not present

## 2019-11-25 DIAGNOSIS — D3131 Benign neoplasm of right choroid: Secondary | ICD-10-CM | POA: Diagnosis not present

## 2019-11-25 DIAGNOSIS — H40013 Open angle with borderline findings, low risk, bilateral: Secondary | ICD-10-CM | POA: Diagnosis not present

## 2019-11-25 LAB — HM DIABETES EYE EXAM

## 2019-11-29 ENCOUNTER — Encounter: Payer: Self-pay | Admitting: *Deleted

## 2019-12-12 ENCOUNTER — Encounter: Payer: Medicare Other | Admitting: Internal Medicine

## 2019-12-21 ENCOUNTER — Other Ambulatory Visit: Payer: Self-pay | Admitting: Cardiology

## 2019-12-27 DIAGNOSIS — L209 Atopic dermatitis, unspecified: Secondary | ICD-10-CM | POA: Diagnosis not present

## 2020-01-03 ENCOUNTER — Encounter: Payer: Self-pay | Admitting: Internal Medicine

## 2020-01-03 NOTE — Patient Instructions (Signed)

## 2020-01-03 NOTE — Progress Notes (Signed)
Annual  Screening/Preventative Visit  & Comprehensive Evaluation & Examination     This very nice 84 y.o. MWM presents for a Screening /Preventative Visit & comprehensive evaluation and management of multiple medical co-morbidities.  Patient has been followed for HTN, HLD, T2_NIDDM  and Vitamin D Deficiency.     HTN predates circa 2000. Patient's BP has been controlled at home.  Today's  . In 2011, patient underwent PCA/DES to the LAD and has done well since - followcd by Dr Ellyn Hack.  Patient denies any cardiac symptoms as chest pain, palpitations, shortness of breath, dizziness or ankle swelling.     Patient's hyperlipidemia is controlled with diet and medications. Patient denies myalgias or other medication SE's. Last lipids were at goal:  Lab Results  Component Value Date   CHOL 143 09/29/2019   HDL 52 09/29/2019   LDLCALC 74 09/29/2019   TRIG 90 09/29/2019   CHOLHDL 2.8 09/29/2019       Patient has hx/o T2_NIDDM with hx/CKD2 (2006)  and patient denies reactive hypoglycemic symptoms, visual blurring, diabetic polys or paresthesias. Last A1c was near goal:  Lab Results  Component Value Date   HGBA1C 5.7 (H) 09/29/2019        Finally, patient has history of Vitamin D Deficiency ("22" / 2008)  and last vitamin D was at goal:  Lab Results  Component Value Date   VD25OH 72 06/06/2019    Current Outpatient Medications on File Prior to Visit  Medication Sig   aspirin EC 81 MG tablet Take 1 tablet (81 mg total) by mouth daily.   Bempedoic Acid (NEXLETOL) 180 MG TABS Take 1 tablet by mouth daily.   CINNAMON PO Take 1,000 mg by mouth daily.    doxazosin (CARDURA) 4 MG tablet Take 1 tablet at Bedtime for BP & Prostate   ezetimibe (ZETIA) 10 MG tablet TAKE 1 TABLET BY MOUTH EVERY DAY   finasteride (PROSCAR) 5 MG tablet Take 5 mg by mouth daily.   fluconazole (DIFLUCAN) 150 MG tablet Take 1 tablet 2 x /week  if needed for yeast infection   lisinopril (ZESTRIL) 5 MG tablet  TAKE 1 TABLET BY MOUTH DAILY.   Magnesium 500 MG CAPS Take 1 capsule by mouth daily.   metFORMIN (GLUCOPHAGE-XR) 500 MG 24 hr tablet TAKE 2 TABLETS TWICE A DAY WITH MEALS FOR DIABETES   neomycin-polymyxin-hydrocortisone (CORTISPORIN) 3.5-10000-1 OTIC suspension Use 6-8 drops to affected ear 3 to 4 x /day   VITAMIN D PO Take 10,000 Units by mouth daily. Takes 10,000 units three times a week   No current facility-administered medications on file prior to visit.   Allergies  Allergen Reactions   Atorvastatin Other (See Comments)    MYALGIAS CRAMPING   Crestor [Rosuvastatin]     MYALGIAS CRAMPING   Statins     MYALGIAS CRAMPING   Tetanus Toxoids     UNSPECIFIED REACTION    Past Medical History:  Diagnosis Date   Arthritis    BPH (benign prostatic hyperplasia)    CAD S/P percutaneous coronary angioplasty 12/2009; 08/2010   a) 6/'22 - NSTEMI: PCI to LAD: Promus Element 2.5 mm x 15 mm DES; b) Cath for Angina: Prox LAD 60-70% pre-stent with FFR 0.82, 90% RVM -- Med Rx, EF 50-55%   Essential hypertension    Hyperlipidemia with target LDL less than 70    Statin intolerance   Non-ST elevation MI (NSTEMI) [facility]) June 2011   PCI to LAD   Pneumonia  DEC 2016  TX WITH ANTIBIOTIC   Type 2 diabetes mellitus without complications [facility])    Health Maintenance  Topic Date Due   FOOT EXAM  11/24/2019   TETANUS/TDAP  06/27/2020 (Originally 07/21/2010)   INFLUENZA VACCINE  02/19/2020   HEMOGLOBIN A1C  03/31/2020   OPHTHALMOLOGY EXAM  11/24/2020   COVID-19 Vaccine  Completed   PNA vac Low Risk Adult  Completed   Immunization History  Administered Date(s) Administered   Influenza, High Dose Seasonal PF 06/01/2013, 04/18/2014, 05/23/2015, 05/20/2016, 04/10/2017, 05/13/2018, 05/10/2019   Influenza-Unspecified 05/13/2011   PFIZER SARS-COV-2 Vaccination 08/17/2019, 09/19/2019   Pneumococcal Conjugate-13 04/18/2014   Pneumococcal Polysaccharide-23 07/21/2002, 05/23/2015    Td 07/21/2000   Last Colon -  06/23/2017 - Dr Oletta Lamas - recc no F/U due to age  Past Surgical History:  Procedure Laterality Date   CARDIAC CATHETERIZATION  12/2009   Proximal LAD stenosis followed by a significant 80-90% distal stenosis   CARDIAC CATHETERIZATION  February 2012    90% ostial RV marginal branch; 60-70% proximal LAD with widely patent distal stent. FFR 0.82; medical therapy   CATARACT EXTRACTION W/ INTRAOCULAR LENS  IMPLANT, BILATERAL     CORONARY ANGIOPLASTY  12/2009   PTCA to proximal LAD; PCI with Promus Element DES stent  2.5 mm x 15 mm  distalLAD - .for non-ST elevation MI   DENTAL SURGERY     LUMBAR LAMINECTOMY/DECOMPRESSION MICRODISCECTOMY N/A 08/09/2015   Procedure: Lumbar Four-Five, Lumbar Five- Sacral One Decompressive Lumbar Laminectomy with Resection of Synovial Cyst;  Surgeon: Erline Levine, MD;  Location: [place] [facility];  Service: Neurosurgery;  Laterality: N/A;  L4 to S1 Decompressive Lumbar Laminectomy   LUMBAR LAMINECTOMY/DECOMPRESSION MICRODISCECTOMY Right 01/21/2016   Procedure: Redo Right Lumbar Five-Sacral One Laminectomy for synovial cyst;  Surgeon: Erline Levine, MD;  Location: [place] [facility];  Service: Neurosurgery;  Laterality: Right;  right   LUMBAR LAMINECTOMY/DECOMPRESSION MICRODISCECTOMY Right 10/08/2017   Procedure: Right Lumbar three-four Laminectomy for synovial cyst;  Surgeon: Erline Levine, MD;  Location: [place];  Service: Neurosurgery;  Laterality: Right;   NASAL FRACTURE SURGERY     TRANSTHORACIC ECHOCARDIOGRAM  June 2011   - (EF not reported) Moderately dilated LV; moderate hypokinesis of anteroseptal and anterior wall consistent with MI. Grade 1 diastolic dysfunction. Mild to moderately dilated LA   VASECTOMY     Family History  Problem Relation Age of Onset   Cancer Father        lung   Stroke Father    Cancer Sister        breast   Cancer Brother        lung   Cancer Son        history of prostate   Social History    Socioeconomic History   Marital status: Married    Spouse name: Kermit Balo   Number of children: 2 children  Occupational History   Retired  Mudlogger - [employer]  Tobacco Use   Smoking status: Current Every Day Smoker    Years: 60.00    Types: Pipe   Smokeless tobacco: Never Used   Tobacco comment: smokes pipe qd  Vaping Use   Vaping Use: Never used  Substance and Sexual Activity   Alcohol use: Yes    Alcohol/week: 7.0 standard drinks    Types: 7 Standard drinks or equivalent per week    Comment: drinks 4 ounces of vodka daily   Drug use: No   Sexual activity: Not on file  Other Topics  Concern   Not on file  Social History Narrative   He is a married father of 2 with no grandchildren as of yet. He continued to smoke a pipe several times the course of day. He does have an occasional alcoholic beverage. While not involved a standard exercise routine, he is very active with walking and working in the yard, splitting wood and doing aggressive yard work including Production designer, theatre/television/film.     ROS Constitutional: Denies fever, chills, weight loss/gain, headaches, insomnia,  night sweats or change in appetite. Does c/o fatigue. Eyes: Denies redness, blurred vision, diplopia, discharge, itchy or watery eyes.  ENT: Denies discharge, congestion, post nasal drip, epistaxis, sore throat, earache, hearing loss, dental pain, Tinnitus, Vertigo, Sinus pain or snoring.  Cardio: Denies chest pain, palpitations, irregular heartbeat, syncope, dyspnea, diaphoresis, orthopnea, PND, claudication or edema Respiratory: denies cough, dyspnea, DOE, pleurisy, hoarseness, laryngitis or wheezing.  Gastrointestinal: Denies dysphagia, heartburn, reflux, water brash, pain, cramps, nausea, vomiting, bloating, diarrhea, constipation, hematemesis, melena, hematochezia, jaundice or hemorrhoids Genitourinary: Denies dysuria, frequency, urgency, nocturia, hesitancy, discharge, hematuria  or flank pain Musculoskeletal: Denies arthralgia, myalgia, stiffness, Jt. Swelling, pain, limp or strain/sprain. Denies Falls. Skin: Denies puritis, rash, hives, warts, acne, eczema or change in skin lesion Neuro: No weakness, tremor, incoordination, spasms, paresthesia or pain Psychiatric: Denies confusion, memory loss or sensory loss. Denies Depression. Endocrine: Denies change in weight, skin, hair change, nocturia, and paresthesia, diabetic polys, visual blurring or hyper / hypo glycemic episodes.  Heme/Lymph: No excessive bleeding, bruising or enlarged lymph nodes.  Physical Exam  There were no vitals taken for this visit.  General Appearance: Well nourished and well groomed and in no apparent distress.  Eyes: PERRLA, EOMs, conjunctiva no swelling or erythema, normal fundi and vessels. Sinuses: No frontal/maxillary tenderness ENT/Mouth: EACs patent / TMs  nl. Nares clear without erythema, swelling, mucoid exudates. Oral hygiene is good. No erythema, swelling, or exudate. Tongue normal, non-obstructing. Tonsils not swollen or erythematous. Hearing normal.  Neck: Supple, thyroid not palpable. No bruits, nodes or JVD. Respiratory: Respiratory effort normal.  BS equal and clear bilateral without rales, rhonci, wheezing or stridor. Cardio: Heart sounds are normal with regular rate and rhythm and no murmurs, rubs or gallops. Peripheral pulses are normal and equal bilaterally without edema. No aortic or femoral bruits. Chest: symmetric with normal excursions and percussion.  Abdomen: Soft, with Nl bowel sounds. Nontender, no guarding, rebound, hernias, masses, or organomegaly.  Lymphatics: Non tender without lymphadenopathy.  Musculoskeletal: Full ROM all peripheral extremities, joint stability, 5/5 strength, and normal gait. Skin: Warm and dry without rashes, lesions, cyanosis, clubbing or  ecchymosis.  Neuro: Cranial nerves intact, reflexes equal bilaterally. Normal muscle tone, no  cerebellar symptoms. Sensation intact to touch, vibratory and Monofilament to the toes bilaterally. Pysch: Alert and oriented X 3 with normal affect, insight and judgment appropriate.   Assessment and Plan  1. Essential hypertension  - EKG 12-Lead - [place], RETROPERITNL ABD,  LTD - Urinalysis, Routine w reflex microscopic - Microalbumin / creatinine urine ratio - CBC with Differential/Platelet - COMPLETE METABOLIC PANEL WITH GFR - Magnesium - TSH  2. Hyperlipidemia associated with type 2 diabetes mellitus ([place])  - EKG 12-Lead - [place], RETROPERITNL ABD,  LTD - Lipid panel - TSH  3. 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 Vitamin D deficiency  - VITAMIN D 25 Hydroxy   5. CAD S/P percutaneous coronary angioplasty and  PCI- DES mid LAD in the setting of non-STEMI  - EKG 12-Lead - [place], RETROPERITNL ABD,  LTD  6. Diabetic retinopathy of both eyes associated with type 2  diabetes mellitus,  ([place])  - Hemoglobin A1c - Insulin, random  7. BPH with obstruction/lower urinary tract symptoms  - PSA  8. Prostate cancer screening  - PSA  9. Screening for colorectal cancer  - POC Hemoccult Bld/Stl (3-Cd Home Screen); Future  10. Screening for ischemic heart disease  - EKG 12-Lead  11. Family history of cerebrovascular event  - EKG 12-Lead - [place], RETROPERITNL ABD,  LTD  12. Smoker  - EKG 12-Lead - [place], RETROPERITNL ABD,  LTD  13. Aortic atherosclerosis (HCC)  - [place], RETROPERITNL ABD,  LTD  14. Screening for AAA (aortic abdominal aneurysm)  - [place], RETROPERITNL ABD,  LTD  15. Medication management  - Urinalysis, Routine w reflex microscopic - Microalbumin / creatinine urine ratio - CBC with Differential/Platelet - COMPLETE METABOLIC PANEL WITH GFR -  Magnesium - Lipid panel - TSH - Hemoglobin A1c - Insulin, random - VITAMIN D 25 Hydroxy             Patient was counseled in prudent diet, weight control to achieve/maintain BMI less than 25, BP monitoring, regular exercise and medications as discussed.  Discussed med effects and SE's. Routine screening labs and tests as requested with regular follow-up as recommended. Over 40 minutes of exam, counseling, chart review and high complex critical decision making was performed   Kirtland Bouchard, MD

## 2020-01-04 ENCOUNTER — Other Ambulatory Visit: Payer: Self-pay | Admitting: Internal Medicine

## 2020-01-04 ENCOUNTER — Ambulatory Visit (INDEPENDENT_AMBULATORY_CARE_PROVIDER_SITE_OTHER): Payer: Medicare Other | Admitting: Internal Medicine

## 2020-01-04 ENCOUNTER — Other Ambulatory Visit: Payer: Self-pay

## 2020-01-04 VITALS — BP 106/56 | HR 60 | Temp 96.7°F | Resp 16 | Ht 70.0 in | Wt 187.8 lb

## 2020-01-04 DIAGNOSIS — E559 Vitamin D deficiency, unspecified: Secondary | ICD-10-CM | POA: Diagnosis not present

## 2020-01-04 DIAGNOSIS — N182 Chronic kidney disease, stage 2 (mild): Secondary | ICD-10-CM | POA: Diagnosis not present

## 2020-01-04 DIAGNOSIS — E1169 Type 2 diabetes mellitus with other specified complication: Secondary | ICD-10-CM

## 2020-01-04 DIAGNOSIS — F17200 Nicotine dependence, unspecified, uncomplicated: Secondary | ICD-10-CM

## 2020-01-04 DIAGNOSIS — I2510 Atherosclerotic heart disease of native coronary artery without angina pectoris: Secondary | ICD-10-CM | POA: Diagnosis not present

## 2020-01-04 DIAGNOSIS — I10 Essential (primary) hypertension: Secondary | ICD-10-CM

## 2020-01-04 DIAGNOSIS — E11319 Type 2 diabetes mellitus with unspecified diabetic retinopathy without macular edema: Secondary | ICD-10-CM

## 2020-01-04 DIAGNOSIS — Z9861 Coronary angioplasty status: Secondary | ICD-10-CM

## 2020-01-04 DIAGNOSIS — Z136 Encounter for screening for cardiovascular disorders: Secondary | ICD-10-CM

## 2020-01-04 DIAGNOSIS — E1122 Type 2 diabetes mellitus with diabetic chronic kidney disease: Secondary | ICD-10-CM | POA: Diagnosis not present

## 2020-01-04 DIAGNOSIS — I700 Atherosclerosis of aorta: Secondary | ICD-10-CM

## 2020-01-04 DIAGNOSIS — N138 Other obstructive and reflux uropathy: Secondary | ICD-10-CM

## 2020-01-04 DIAGNOSIS — Z125 Encounter for screening for malignant neoplasm of prostate: Secondary | ICD-10-CM

## 2020-01-04 DIAGNOSIS — Z8249 Family history of ischemic heart disease and other diseases of the circulatory system: Secondary | ICD-10-CM

## 2020-01-04 DIAGNOSIS — Z1211 Encounter for screening for malignant neoplasm of colon: Secondary | ICD-10-CM

## 2020-01-04 DIAGNOSIS — E785 Hyperlipidemia, unspecified: Secondary | ICD-10-CM

## 2020-01-04 DIAGNOSIS — Z79899 Other long term (current) drug therapy: Secondary | ICD-10-CM

## 2020-01-04 NOTE — Progress Notes (Signed)
Annual  Screening/Preventative Visit  & Comprehensive Evaluation & Examination     This very nice 84 y.o. MWM presents for a Screening /Preventative Visit & comprehensive evaluation and management of multiple medical co-morbidities.  Patient has been followed for HTN, HLD, T2_NIDDM  and Vitamin D Deficiency.     HTN predates circa 2000. Patient's BP has been controlled at home.  Today's BP is at goal  - 106/56. In 2011, patient underwent PCA/DES to the LAD and is followed by Dr Ellyn Hack &has done well since.  Patient denies any cardiac symptoms as chest pain, palpitations, shortness of breath, dizziness or ankle swelling.     Patient's hyperlipidemia is controlled with diet and medications. Patient denies myalgias or other medication SE's. Last lipids were at goal:  Lab Results  Component Value Date   CHOL 143 09/29/2019   HDL 52 09/29/2019   LDLCALC 74 09/29/2019   TRIG 90 09/29/2019   CHOLHDL 2.8 09/29/2019       Patient has hx/o T2_NIDDM with hx/CKD2 (2006)  and patient denies reactive hypoglycemic symptoms, visual blurring, diabetic polys or paresthesias. Last A1c was near goal:  Lab Results  Component Value Date   HGBA1C 5.7 (H) 09/29/2019        Finally, patient has history of Vitamin D Deficiency ("22" / 2008)  and last vitamin D was at goal:  Lab Results  Component Value Date   VD25OH 72 06/06/2019    Current Outpatient Medications on File Prior to Visit  Medication Sig  . aspirin EC 81 MG tablet Take 1 tablet (81 mg total) by mouth daily.  . Bempedoic Acid (NEXLETOL) 180 MG TABS Take 1 tablet by mouth daily.  Marland Kitchen CINNAMON PO Take 1,000 mg by mouth daily.   Marland Kitchen doxazosin (CARDURA) 4 MG tablet Take 1 tablet at Bedtime for BP & Prostate  . ezetimibe (ZETIA) 10 MG tablet TAKE 1 TABLET BY MOUTH EVERY DAY  . finasteride (PROSCAR) 5 MG tablet Take 5 mg by mouth daily.  Marland Kitchen lisinopril (ZESTRIL) 5 MG tablet TAKE 1 TABLET BY MOUTH DAILY.  . Magnesium 500 MG CAPS Take 1 capsule by  mouth daily.  . metFORMIN (GLUCOPHAGE-XR) 500 MG 24 hr tablet TAKE 2 TABLETS TWICE A DAY WITH MEALS FOR DIABETES  . neomycin-polymyxin-hydrocortisone (CORTISPORIN) 3.5-10000-1 OTIC suspension Use 6-8 drops to affected ear 3 to 4 x /day  . VITAMIN D PO Take 10,000 Units by mouth daily. Takes 10,000 units three times a week   No current facility-administered medications on file prior to visit.   Allergies  Allergen Reactions  . Atorvastatin Other (See Comments)    MYALGIAS CRAMPING  . Crestor [Rosuvastatin]     MYALGIAS CRAMPING  . Statins     MYALGIAS CRAMPING  . Tetanus Toxoids     UNSPECIFIED REACTION    Past Medical History:  Diagnosis Date  . Arthritis   . BPH (benign prostatic hyperplasia)   . CAD S/P percutaneous coronary angioplasty 12/2009; 08/2010   a) 6/'22 - NSTEMI: PCI to LAD: Promus Element 2.5 mm x 15 mm DES; b) Cath for Angina: Prox LAD 60-70% pre-stent with FFR 0.82, 90% RVM -- Med Rx, EF 50-55%  . Essential hypertension   . Hyperlipidemia with target LDL less than 70    Statin intolerance  . Non-ST elevation MI (NSTEMI) [facility]) June 2011   PCI to LAD  . Pneumonia    DEC 2016  [place] WITH ANTIBIOTIC  . Type 2 diabetes mellitus without complications (  [facility])    Health Maintenance  Topic Date Due  . TETANUS/TDAP  06/27/2020 (Originally 07/21/2010)  . INFLUENZA VACCINE  02/19/2020  . HEMOGLOBIN A1C  03/31/2020  . OPHTHALMOLOGY EXAM  11/24/2020  . FOOT EXAM  01/02/2021  . COVID-19 Vaccine  Completed  . PNA vac Low Risk Adult  Completed   Immunization History  Administered Date(s) Administered  . Influenza, High Dose Seasonal PF 06/01/2013, 04/18/2014, 05/23/2015, 05/20/2016, 04/10/2017, 05/13/2018, 05/10/2019  . Influenza-Unspecified 05/13/2011  . PFIZER SARS-COV-2 Vaccination 08/17/2019, 09/19/2019  . Pneumococcal Conjugate-13 04/18/2014  . Pneumococcal Polysaccharide-23 07/21/2002, 05/23/2015  . Td 07/21/2000   Last Colon -  06/23/2017 - Dr Oletta Lamas - recc no F/U  due to age  Past Surgical History:  Procedure Laterality Date  . CARDIAC CATHETERIZATION  12/2009   Proximal LAD stenosis followed by a significant 80-90% distal stenosis  . CARDIAC CATHETERIZATION  February 2012    90% ostial RV marginal branch; 60-70% proximal LAD with widely patent distal stent. FFR 0.82; medical therapy  . CATARACT EXTRACTION W/ INTRAOCULAR LENS  IMPLANT, BILATERAL    . CORONARY ANGIOPLASTY  12/2009   PTCA to proximal LAD; PCI with Promus Element DES stent  2.5 mm x 15 mm  distalLAD - .for non-ST elevation MI  . DENTAL SURGERY    . LUMBAR LAMINECTOMY/DECOMPRESSION MICRODISCECTOMY N/A 08/09/2015   Procedure: Lumbar Four-Five, Lumbar Five- Sacral One Decompressive Lumbar Laminectomy with Resection of Synovial Cyst;  Surgeon: Erline Levine, MD;  Location: [place] [facility];  Service: Neurosurgery;  Laterality: N/A;  L4 to S1 Decompressive Lumbar Laminectomy  . LUMBAR LAMINECTOMY/DECOMPRESSION MICRODISCECTOMY Right 01/21/2016   Procedure: Redo Right Lumbar Five-Sacral One Laminectomy for synovial cyst;  Surgeon: Erline Levine, MD;  Location: [place] [facility];  Service: Neurosurgery;  Laterality: Right;  right  . LUMBAR LAMINECTOMY/DECOMPRESSION MICRODISCECTOMY Right 10/08/2017   Procedure: Right Lumbar three-four Laminectomy for synovial cyst;  Surgeon: Erline Levine, MD;  Location: [place];  Service: Neurosurgery;  Laterality: Right;  . NASAL FRACTURE SURGERY    . TRANSTHORACIC ECHOCARDIOGRAM  June 2011   - (EF not reported) Moderately dilated LV; moderate hypokinesis of anteroseptal and anterior wall consistent with MI. Grade 1 diastolic dysfunction. Mild to moderately dilated LA  . VASECTOMY     Family History  Problem Relation Age of Onset  . Cancer Father        lung  . Stroke Father   . Cancer Sister        breast  . Cancer Brother        lung  . Cancer Son        history of prostate   Social History   Socioeconomic History  . Marital status: Married    Spouse name: Kermit Balo    . Number of children: 2 children  Occupational History  . Retired  Mudlogger - [employer]  Tobacco Use  . Smoking status: Current Every Day Smoker    Years: 60.00    Types: Pipe  . Smokeless tobacco: Never Used  . Tobacco comment: smokes pipe qd  Vaping Use  . Vaping Use: Never used  Substance and Sexual Activity  . Alcohol use: Yes    Alcohol/week: 7.0 standard drinks    Types: 7 Standard drinks or equivalent per week    Comment: drinks 4 ounces of vodka daily  . Drug use: No  . Sexual activity: Not on file  Other Topics Concern  . Not on file  Social History Narrative   He  is a married father of 2 with no grandchildren as of yet. He continued to smoke a pipe several times the course of day. He does have an occasional alcoholic beverage. While not involved a standard exercise routine, he is very active with walking and working in the yard, splitting wood and doing aggressive yard work including Production designer, theatre/television/film.     ROS Constitutional: Denies fever, chills, weight loss/gain, headaches, insomnia,  night sweats or change in appetite. Does c/o fatigue. Eyes: Denies redness, blurred vision, diplopia, discharge, itchy or watery eyes.  ENT: Denies discharge, congestion, post nasal drip, epistaxis, sore throat, earache, hearing loss, dental pain, Tinnitus, Vertigo, Sinus pain or snoring.  Cardio: Denies chest pain, palpitations, irregular heartbeat, syncope, dyspnea, diaphoresis, orthopnea, PND, claudication or edema Respiratory: denies cough, dyspnea, DOE, pleurisy, hoarseness, laryngitis or wheezing.  Gastrointestinal: Denies dysphagia, heartburn, reflux, water brash, pain, cramps, nausea, vomiting, bloating, diarrhea, constipation, hematemesis, melena, hematochezia, jaundice or hemorrhoids Genitourinary: Denies dysuria, frequency, urgency, nocturia, hesitancy, discharge, hematuria or flank pain Musculoskeletal: Denies arthralgia, myalgia, stiffness, Jt.  Swelling, pain, limp or strain/sprain. Denies Falls. Skin: Denies puritis, rash, hives, warts, acne, eczema or change in skin lesion Neuro: No weakness, tremor, incoordination, spasms, paresthesia or pain Psychiatric: Denies confusion, memory loss or sensory loss. Denies Depression. Endocrine: Denies change in weight, skin, hair change, nocturia, and paresthesia, diabetic polys, visual blurring or hyper / hypo glycemic episodes.  Heme/Lymph: No excessive bleeding, bruising or enlarged lymph nodes.  Physical Exam  BP (!) 106/56   Pulse 60   Temp (!) 96.7 F (35.9 C)   Resp 16   Ht 5\' 10"  (1.778 m)   Wt 187 lb 12.8 oz (85.2 kg)   BMI 26.95 kg/m   General Appearance: Well nourished and well groomed and in no apparent distress.  Eyes: PERRLA, EOMs, conjunctiva no swelling or erythema, normal fundi and vessels. Sinuses: No frontal/maxillary tenderness ENT/Mouth: EACs patent / TMs  nl. Nares clear without erythema, swelling, mucoid exudates. Oral hygiene is good. No erythema, swelling, or exudate. Tongue normal, non-obstructing. Tonsils not swollen or erythematous. Hearing normal.  Neck: Supple, thyroid not palpable. No bruits, nodes or JVD. Respiratory: Respiratory effort normal.  BS equal and clear bilateral without rales, rhonci, wheezing or stridor. Cardio: Heart sounds are normal with regular rate and rhythm and no murmurs, rubs or gallops. Peripheral pulses are normal and equal bilaterally without edema. No aortic or femoral bruits. Chest: symmetric with normal excursions and percussion.  Abdomen: Soft, with Nl bowel sounds. Nontender, no guarding, rebound, hernias, masses, or organomegaly.  Lymphatics: Non tender without lymphadenopathy.  Musculoskeletal: Full ROM all peripheral extremities, joint stability, 5/5 strength, and normal gait. Skin: Warm and dry without rashes, lesions, cyanosis, clubbing or  ecchymosis.  Neuro: Cranial nerves intact, reflexes equal bilaterally. Normal  muscle tone, no cerebellar symptoms. Sensation intact to touch, vibratory and Monofilament to the toes bilaterally. Pysch: Alert and oriented X 3 with normal affect, insight and judgment appropriate.   Assessment and Plan  1. Essential hypertension  - EKG 12-Lead - [place], RETROPERITNL ABD,  LTD - Urinalysis, Routine w reflex microscopic - Microalbumin / creatinine urine ratio - CBC with Differential/Platelet - COMPLETE METABOLIC PANEL WITH GFR - Magnesium - TSH  2. Hyperlipidemia associated with type 2 diabetes mellitus ([place])  - EKG 12-Lead - [place], RETROPERITNL ABD,  LTD - Lipid panel - TSH  3. Type 2 diabetes mellitus with stage 2 chronic kidney disease,  without long-term current use  of insulin ([place])  - EKG 12-Lead - [place], RETROPERITNL ABD,  LTD - LOW EXTREMITY NEUR EXAM DOCUM - Hemoglobin A1c - Insulin, random - HM DIABETES FOOT EXAM  4. Vitamin D deficiency  - VITAMIN D 25 Hydroxy   5. CAD S/P percutaneous coronary angioplasty and  PCI- DES mid LAD in the setting of non-STEMI  - EKG 12-Lead - [place], RETROPERITNL ABD,  LTD  6. Diabetic retinopathy of both eyes associated with type 2  diabetes mellitus,  ([place])  - Hemoglobin A1c - Insulin, random  7. BPH with obstruction/lower urinary tract symptoms  - PSA  8. Prostate cancer screening  - PSA  9. Screening for colorectal cancer  - POC Hemoccult Bld/Stl (3-Cd Home Screen); Future  10. Screening for ischemic heart disease  - EKG 12-Lead  11. Family history of cerebrovascular event  - EKG 12-Lead - [place], RETROPERITNL ABD,  LTD  12. Smoker  - EKG 12-Lead - [place], RETROPERITNL ABD,  LTD  13. Aortic atherosclerosis (HCC)  - [place], RETROPERITNL ABD,  LTD  14. Screening for AAA (aortic abdominal aneurysm)  - [place], RETROPERITNL ABD,  LTD  15. Medication management  - Urinalysis, Routine w reflex microscopic - Microalbumin / creatinine urine ratio - CBC with Differential/Platelet - COMPLETE METABOLIC PANEL  WITH GFR - Magnesium - Lipid panel - TSH - Hemoglobin A1c - Insulin, random - VITAMIN D 25 Hydroxy         Patient was counseled in prudent diet, weight control to achieve/maintain BMI less than 25, BP monitoring, regular exercise and medications as discussed.  Discussed med effects and SE's. Routine screening labs and tests as requested with regular follow-up as recommended. Over 40 minutes of exam, counseling, chart review and high complex critical decision making was performed   Kirtland Bouchard, MD

## 2020-01-05 LAB — TSH: TSH: 1.72 mIU/L (ref 0.40–4.50)

## 2020-01-05 LAB — COMPLETE METABOLIC PANEL WITH GFR
AG Ratio: 1.7 (calc) (ref 1.0–2.5)
ALT: 10 U/L (ref 9–46)
AST: 14 U/L (ref 10–35)
Albumin: 4.3 g/dL (ref 3.6–5.1)
Alkaline phosphatase (APISO): 45 U/L (ref 35–144)
BUN: 15 mg/dL (ref 7–25)
CO2: 28 mmol/L (ref 20–32)
Calcium: 9.8 mg/dL (ref 8.6–10.3)
Chloride: 101 mmol/L (ref 98–110)
Creat: 0.88 mg/dL (ref 0.70–1.11)
GFR, Est African American: 93 mL/min/{1.73_m2} (ref >=60)
GFR, Est Non African American: 81 mL/min/{1.73_m2} (ref >=60)
Globulin: 2.5 g/dL (calc) (ref 1.9–3.7)
Glucose, Bld: 91 mg/dL (ref 65–99)
Potassium: 4.5 mmol/L (ref 3.5–5.3)
Sodium: 137 mmol/L (ref 135–146)
Total Bilirubin: 0.6 mg/dL (ref 0.2–1.2)
Total Protein: 6.8 g/dL (ref 6.1–8.1)

## 2020-01-05 LAB — VITAMIN D 25 HYDROXY (VIT D DEFICIENCY, FRACTURES): Vit D, 25-Hydroxy: 67 ng/mL (ref 30–100)

## 2020-01-05 LAB — URINALYSIS, ROUTINE W REFLEX MICROSCOPIC
Bilirubin Urine: NEGATIVE
Glucose, UA: NEGATIVE
Hgb urine dipstick: NEGATIVE
Ketones, ur: NEGATIVE
Leukocytes,Ua: NEGATIVE
Nitrite: NEGATIVE
Protein, ur: NEGATIVE
Specific Gravity, Urine: 1.009 (ref 1.001–1.03)
pH: 7 (ref 5.0–8.0)

## 2020-01-05 LAB — CBC WITH DIFFERENTIAL/PLATELET
Absolute Monocytes: 750 cells/uL (ref 200–950)
Basophils Absolute: 62 cells/uL (ref 0–200)
Basophils Relative: 1 %
Eosinophils Absolute: 310 cells/uL (ref 15–500)
Eosinophils Relative: 5 %
HCT: 44.1 % (ref 38.5–50.0)
Hemoglobin: 14.7 g/dL (ref 13.2–17.1)
Lymphs Abs: 1414 cells/uL (ref 850–3900)
MCH: 31.6 pg (ref 27.0–33.0)
MCHC: 33.3 g/dL (ref 32.0–36.0)
MCV: 94.8 fL (ref 80.0–100.0)
MPV: 11.4 fL (ref 7.5–12.5)
Monocytes Relative: 12.1 %
Neutro Abs: 3664 cells/uL (ref 1500–7800)
Neutrophils Relative %: 59.1 %
Platelets: 237 10*3/uL (ref 140–400)
RBC: 4.65 10*6/uL (ref 4.20–5.80)
RDW: 12.3 % (ref 11.0–15.0)
Total Lymphocyte: 22.8 %
WBC: 6.2 10*3/uL (ref 3.8–10.8)

## 2020-01-05 LAB — MICROALBUMIN / CREATININE URINE RATIO
Creatinine, Urine: 40 mg/dL (ref 20–320)
Microalb, Ur: <0.2 mg/dL

## 2020-01-05 LAB — PSA: PSA: 0.7 ng/mL (ref <=4.0)

## 2020-01-05 LAB — LIPID PANEL
Cholesterol: 136 mg/dL (ref <200)
HDL: 54 mg/dL (ref >=40)
LDL Cholesterol (Calc): 68 mg/dL (calc)
Non-HDL Cholesterol (Calc): 82 mg/dL (calc) (ref <130)
Total CHOL/HDL Ratio: 2.5 (calc) (ref <5.0)
Triglycerides: 59 mg/dL (ref <150)

## 2020-01-05 LAB — HEMOGLOBIN A1C
Hgb A1c MFr Bld: 5.5 % of total Hgb (ref <5.7)
Mean Plasma Glucose: 111 (calc)
eAG (mmol/L): 6.2 (calc)

## 2020-01-05 LAB — INSULIN, RANDOM: Insulin: 6.8 u[IU]/mL

## 2020-01-05 LAB — MAGNESIUM: Magnesium: 2.1 mg/dL (ref 1.5–2.5)

## 2020-02-22 ENCOUNTER — Other Ambulatory Visit: Payer: Self-pay | Admitting: Internal Medicine

## 2020-02-23 ENCOUNTER — Other Ambulatory Visit: Payer: Self-pay | Admitting: *Deleted

## 2020-02-23 DIAGNOSIS — Z1211 Encounter for screening for malignant neoplasm of colon: Secondary | ICD-10-CM | POA: Diagnosis not present

## 2020-02-23 DIAGNOSIS — Z1212 Encounter for screening for malignant neoplasm of rectum: Secondary | ICD-10-CM | POA: Diagnosis not present

## 2020-02-23 LAB — POC HEMOCCULT BLD/STL (HOME/3-CARD/SCREEN)
Card #2 Fecal Occult Blod, POC: NEGATIVE
Card #3 Fecal Occult Blood, POC: NEGATIVE
Fecal Occult Blood, POC: NEGATIVE

## 2020-03-05 ENCOUNTER — Other Ambulatory Visit: Payer: Self-pay

## 2020-03-05 ENCOUNTER — Ambulatory Visit (INDEPENDENT_AMBULATORY_CARE_PROVIDER_SITE_OTHER): Payer: Medicare Other | Admitting: Adult Health

## 2020-03-05 ENCOUNTER — Encounter: Payer: Self-pay | Admitting: Adult Health

## 2020-03-05 VITALS — BP 114/64 | HR 68 | Temp 97.2°F | Wt 190.4 lb

## 2020-03-05 DIAGNOSIS — T63301A Toxic effect of unspecified spider venom, accidental (unintentional), initial encounter: Secondary | ICD-10-CM | POA: Diagnosis not present

## 2020-03-05 DIAGNOSIS — I2510 Atherosclerotic heart disease of native coronary artery without angina pectoris: Secondary | ICD-10-CM

## 2020-03-05 DIAGNOSIS — Z9861 Coronary angioplasty status: Secondary | ICD-10-CM

## 2020-03-05 DIAGNOSIS — R238 Other skin changes: Secondary | ICD-10-CM

## 2020-03-05 DIAGNOSIS — M7989 Other specified soft tissue disorders: Secondary | ICD-10-CM

## 2020-03-05 MED ORDER — PREDNISONE 20 MG PO TABS
ORAL_TABLET | ORAL | 0 refills | Status: DC
Start: 2020-03-05 — End: 2020-04-12

## 2020-03-05 MED ORDER — DOXYCYCLINE HYCLATE 100 MG PO CAPS
ORAL_CAPSULE | ORAL | 0 refills | Status: DC
Start: 2020-03-05 — End: 2020-04-12

## 2020-03-05 NOTE — Patient Instructions (Signed)
Spider Bite Spider bites are not common. When spider bites do happen, most do not cause serious health problems. There are only a few types of spider bites that can cause serious health problems. What are the causes? This condition is caused when a person accidentally makes contact with a spider in a way that traps the spider against the person's skin. What are the signs or symptoms? Symptoms may vary depending on the type of spider. Some spider bites may cause symptoms within 1 hour after the bite. For other spider bites, it may take 1-2 days for symptoms to develop. Common symptoms of this condition include:  A raised area that is red.  Redness and swelling around the area of the bite or bites.  Discomfort or pain in the area of the bite. A few types of spiders, such as the black widow spider or the brown recluse spider, can inject poison (venom) into a bite wound. This venom causes more serious symptoms. Symptoms of a venomous spider bite vary, and may include:  Muscle cramps.  Nausea, vomiting, or abdominal pain.  A fever.  A skin sore (lesion) that spreads. This can break into an open wound (skin ulcer).  Light-headedness or dizziness. How is this diagnosed? This condition may be diagnosed based on your symptoms and a physical exam. Your health care provider will ask about the history of your injury and any details you may have about the spider. This may help to determine what type of spider bit you. How is this treated? Many spider bites do not require treatment. If needed, this condition may be treated by:  Icing and keeping the bite area raised (elevated).  Taking or applying over-the-counter or prescription medicines to help control symptoms such as pain and itching.  Having a tetanus shot, if needed.  Taking antibiotic medicine. Follow these instructions at home: Medicines  Take or apply over-the-counter and prescription medicines only as told by your health care  provider.  If you were prescribed an antibiotic medicine, take it as told by your health care provider. Do not stop using the antibiotic even if you start to feel better. Managing pain and swelling   If directed, put ice on the bite area. ? Put ice in a plastic bag. ? Place a towel between your skin and the bag. ? Leave the ice on for 20 minutes, 2-3 times a day.  Elevate the affected area above the level of your heart while you are sitting or lying down. General instructions   Do not scratch the bite area.  Keep the bite area clean and dry. Wash the bite area daily with soap and water as told by your health care provider.  Keep all follow-up visits as told by your health care provider. This is important. Contact a health care provider if:  Your bite does not get better after 3 days of treatment.  Your bite turns black or purple.  You have increased redness, swelling, or pain at the site of the bite. Get help right away if:  You develop shortness of breath or chest pain.  You have fluid, blood, or pus coming from the bite area.  You have muscle cramps or painful muscle spasms.  You develop abdominal pain, nausea, or vomiting.  You feel unusually tired (fatigued) or sleepy. Summary  Spider bites are not common. When spider bites do happen, most do not cause serious health problems.  Take or apply over-the-counter and prescription medicines only as told by your health  care provider.  Keep the bite area clean and dry. Wash the bite area daily with soap and water as told by your health care provider.  Contact a health care provider if you have increased redness, swelling, or pain at the site of the bite.  Get help right away if you have new or worsening symptoms or develop shortness of breath or chest pain. This information is not intended to replace advice given to you by your health care provider. Make sure you discuss any questions you have with your health care  provider. Document Revised: 02/16/2018 Document Reviewed: 02/16/2018 Elsevier Patient Education  [place].

## 2020-03-05 NOTE — Progress Notes (Signed)
Assessment and Plan:  David [name] was seen today for insect bite.  Diagnoses and all orders for this visit:  Redness and swelling of hand Spider bite wound, accidental or unintentional, initial encounter Localized reaction; ice, elevation, try steroid first, however possible cellulitis, will proceed with abx tx; call office or go to ER if redness/pain any worse, fever/chills, red streaks up arm, local wound with purulent discharge or skin breakdown  -     predniSONE (DELTASONE) 20 MG tablet; 2 tablets daily for 3 days, 1 tablet daily for 4 days. -     doxycycline (VIBRAMYCIN) 100 MG capsule; Take 1 capsule 2 x/day with food for 5 days -  then 1 x/day with food for 10 days  Further disposition pending results of labs. Discussed med's effects and SE's.   Over 15 minutes of exam, counseling, chart review, and critical decision making was performed.   Future Appointments  Date Time Provider [place]  04/12/2020 10:30 AM [name], NP [facility] None  07/17/2020 10:30 AM [name], MD [facility] None  10/09/2020 11:30 AM Garnet Sierras, NP [facility] None  01/07/2021 11:00 AM [name], MD [facility] None    ------------------------------------------------------------------------------------------------------------------   HPI BP 114/64   Pulse 68   Temp (!) 97.2 F (36.2 C)   Wt 190 lb 6.4 oz (86.4 kg)   SpO2 97%   BMI 27.32 kg/m   84 y.o.male current pie smoker with well controlled T2DM presents for evaluation of suspected spider bite to R hand. He reports was working in garden yesterday with gloves on, had sharp sudden pain on dorsum of hand, never did see any spider but since has noted progressive redness, heat, swelling of hand. Describes aching, non-radiating pain. Denies fever/chills, ulcers or breakdown of skin, pain in joints and extending up his arm. He tried taking some tylenol with limited benefit.    Past Medical History:  Diagnosis Date  .  Arthritis   . BPH (benign prostatic hyperplasia)   . CAD S/P percutaneous coronary angioplasty 12/2009; 08/2010   a) 6/'22 - NSTEMI: PCI to LAD: Promus Element 2.5 mm x 15 mm DES; b) Cath for Angina: Prox LAD 60-70% pre-stent with FFR 0.82, 90% RVM -- Med Rx, EF 50-55%  . Essential hypertension   . Hyperlipidemia with target LDL less than 70    Statin intolerance  . Non-ST elevation MI (NSTEMI) [facility]) June 2011   PCI to LAD  . Pneumonia    DEC 2016  [place] WITH ANTIBIOTIC  . Type 2 diabetes mellitus without complications (HCC)      Allergies  Allergen Reactions  . Atorvastatin Other (See Comments)    MYALGIAS CRAMPING  . Crestor [Rosuvastatin]     MYALGIAS CRAMPING  . Statins     MYALGIAS CRAMPING  . Tetanus Toxoids     UNSPECIFIED REACTION     Current Outpatient Medications on File Prior to Visit  Medication Sig  . aspirin EC 81 MG tablet Take 1 tablet (81 mg total) by mouth daily.  . Bempedoic Acid (NEXLETOL) 180 MG TABS Take 1 tablet by mouth daily.  Marland Kitchen CINNAMON PO Take 1,000 mg by mouth daily.   Marland Kitchen doxazosin (CARDURA) 4 MG tablet TAKE 1 TABLET AT BEDTIME FOR BP & PROSTATE  . ezetimibe (ZETIA) 10 MG tablet TAKE 1 TABLET BY MOUTH EVERY DAY  . finasteride (PROSCAR) 5 MG tablet Take 5 mg by mouth daily.  Marland Kitchen lisinopril (ZESTRIL) 5 MG tablet TAKE 1 TABLET BY MOUTH DAILY.  Marland Kitchen  Magnesium 500 MG CAPS Take 1 capsule by mouth daily.  . metFORMIN (GLUCOPHAGE-XR) 500 MG 24 hr tablet TAKE 2 TABLETS TWICE A DAY WITH MEALS FOR DIABETES  . neomycin-polymyxin-hydrocortisone (CORTISPORIN) 3.5-10000-1 OTIC suspension Use 6-8 drops to affected ear 3 to 4 x /day (Patient taking differently: as needed. Use 6-8 drops to affected ear 3 to 4 x /day)  . VITAMIN D PO Take 10,000 Units by mouth daily. Takes 10,000 units three times a week   No current facility-administered medications on file prior to visit.    ROS: all negative except above.   Physical Exam:  BP 114/64   Pulse 68   Temp (!) 97.2 F  (36.2 C)   Wt 190 lb 6.4 oz (86.4 kg)   SpO2 97%   BMI 27.32 kg/m   General Appearance: Well nourished, in no apparent distress. Eyes: PERRLA, conjunctiva no swelling or erythema ENT/Mouth: Mask in place; Hearing normal.  Neck: Supple Respiratory: Respiratory effort normal, BS equal bilaterally without rales, rhonchi, wheezing or stridor.  Cardio: RRR with no MRGs. Brisk peripheral pulses without edema.  Abdomen: Soft, + BS.  Non tender Lymphatics: Non tender without lymphadenopathy.  Musculoskeletal: Full ROM, no joint effusion; slow steady gait Skin: Warm, dry; R hand dorsum with generalized mild swelling, erythema, mildly warm to touch; not with distinct borders; has central area with small scab.  Neuro: Cranial nerves intact. Normal muscle tone, no cerebellar symptoms. Sensation intact.  Psych: Awake and oriented X 3, normal affect, Insight and Judgment appropriate.     David Ribas, NP 4:24 PM [facility]

## 2020-03-25 ENCOUNTER — Other Ambulatory Visit: Payer: Self-pay | Admitting: Cardiology

## 2020-04-10 ENCOUNTER — Other Ambulatory Visit: Payer: Self-pay | Admitting: Physician Assistant

## 2020-04-11 NOTE — Progress Notes (Signed)
FOLLOW UP 3 Months  Assessment and Plan:   Diagnoses and all orders for this visit:  Aortic atherosclerosis (HCC) Monitor lipids, blood pressure and glucose control  CAD S/P percutaneous coronary angioplasty and PCI- DES mid LAD in the setting of non-STEMI Follows with Cardiology ASA & Plavix continue medically controlled currently  Essential Hypertension Well controlled with current medications  Monitor blood pressure at home; patient to call if consistently greater than 130/80 Continue DASH diet.   Reminder to go to the ER if any CP, SOB, nausea, dizziness, severe HA, changes vision/speech, left arm numbness and tingling and jaw pain.  Type II diabetes mellitus with nephropathy (HCC) Continue Metformin 500mg  XR, two tablets twice a day with food  Does not check glucose at home Continue diet and exercise.  Perform daily foot/skin check, notify office of any concerning changes.  Check A1C  Hyperlipidemia associated with T2DM, LDL goal <70 Statin intolerant, doing well with zetia/nexlitol Continue low cholesterol diet and exercise.  Check lipid panel.   Tobacco dependency -not ready to quit Will continue to assess  Vitamin D Def At goal last visit;  continue supplementation to maintain goal of 60-100 Defer Vit D level.  overweight with co morbidities Long discussion about weight loss, diet, and exercise Recommended diet heavy in fruits and veggies and low in animal meats, cheeses, and dairy products, appropriate calorie intake Discussed ideal weight for height  Patient will work on increasing walking Will follow up in 3 months  Medication management -     CBC with Differential/Platelet -     COMPLETE METABOLIC PANEL WITH GFR -     Magnesium  Mild memory changes Mild gradual changes, slow recall; no family history of Alzheimer's, high risk vascular history, current smoker without intention to quit CT from 06/2009 shows mild cerebral atrophy without other  concerning changes Today he is agreeable to checking B12, TSH, agreeable to proceeding with neuroimaging pending labs Control blood pressure, cholesterol, glucose, increase exercise, advised to quit smoking, continue ASA  Follow up in three months. Continue diet and meds as discussed. Further disposition pending results of labs. Discussed med's effects and SE's.   Over 30 minutes of exam, counseling, chart review, and critical decision making was performed.   Future Appointments  Date Time Provider [place]  07/17/2020 10:30 AM [name], MD [facility] None  10/09/2020 11:30 AM Garnet Sierras, NP [facility] None  01/07/2021 11:00 AM [name], MD [facility] None    ----------------------------------------------------------------------------------------------------------------------  HPI 84 y.o. male  presents for 3 month follow up on hypertension, cholesterol, DMII,BPH, tobacco dependency, weight and vitamin D deficiency.   Had urgery on 09/2017 for cyst removal lumbar by Dr Vertell Limber.  Reports he is still recovering from this and this cyst has been a reoccurring problem for him, has residual numbness of R extremity, notes for 5-10 min after long periods of immobility following the last surgery. Also residual weakness, has fallen 3-4 times without significant injury. Does have cane, needs to use more. Getting back injections, scheduled for Monday, seem to help significantly.   He reports gradual memory changes, takes longer to recall memories; denies forgetting to pay bills, getting lost, forgetting names of people he hasn't seen in a while. Thyroid functions have been normal in this patient; B12 has never been checked. Last Ct brain in 2010 showed Mild cerebral atrophy.    he currently continues to smoke a pipe daily; discussed risks associated with smoking, patient is not ready to quit.  BMI is Body mass index is 27.26 kg/m., he has been working on diet, exercise is  limited secondary to back pain, reports can do careful  Follows low sugar and starch diet, wife cooks diabetes friendly.  Wt Readings from Last 3 Encounters:  04/12/20 190 lb (86.2 kg)  03/05/20 190 lb 6.4 oz (86.4 kg)  01/04/20 187 lb 12.8 oz (85.2 kg)   In 2011, he had NSTEMI with PCA/DES to the LAD and has done well since.  Currently he is followed by Dr Ellyn Hack His blood pressure has been controlled at home, today their BP is BP: 120/64  He does not workout. He denies chest pain, shortness of breath, dizziness.   He is on cholesterol medication Zetia 10 mg daily and nexlitol (discussed combo pill, declines) and denies myalgias; he has hx of statin intolerance.  His cholesterol is at goal of LDL <70. The cholesterol last visit was:   Lab Results  Component Value Date   CHOL 136 01/04/2020   HDL 54 01/04/2020   LDLCALC 68 01/04/2020   TRIG 59 01/04/2020   CHOLHDL 2.5 01/04/2020    He has not been working on diet and exercise for T2 diabetes (well controlled on metformin 500 mg 4 tabs daily), and denies hyperglycemia, hypoglycemia , nausea, polydipsia and polyuria. He does not check glucose secondary to consistently excellent control, has supplies if needed to check. Last A1C in the office was:  Lab Results  Component Value Date   HGBA1C 5.5 01/04/2020   Lab Results  Component Value Date   GFRNONAA 81 01/04/2020   Patient is on Vitamin D supplement, has decreased supplement - taking 10000 IU three times a week. Lab Results  Component Value Date   VD25OH 67 01/04/2020       Current Medications:  Current Outpatient Medications on File Prior to Visit  Medication Sig  . aspirin EC 81 MG tablet Take 1 tablet (81 mg total) by mouth daily.  . Bempedoic Acid (NEXLETOL) 180 MG TABS Take 1 tablet by mouth daily.  Marland Kitchen CINNAMON PO Take 1,000 mg by mouth daily.   Marland Kitchen doxazosin (CARDURA) 4 MG tablet TAKE 1 TABLET AT BEDTIME FOR BP & PROSTATE  . ezetimibe (ZETIA) 10 MG tablet TAKE 1 TABLET  BY MOUTH EVERY DAY  . finasteride (PROSCAR) 5 MG tablet Take 5 mg by mouth daily.  Marland Kitchen lisinopril (ZESTRIL) 5 MG tablet TAKE 1 TABLET BY MOUTH DAILY.  . Magnesium 500 MG CAPS Take 1 capsule by mouth daily.  . metFORMIN (GLUCOPHAGE-XR) 500 MG 24 hr tablet TAKE 2 TABLETS TWICE A DAY WITH MEALS FOR DIABETES  . neomycin-polymyxin-hydrocortisone (CORTISPORIN) 3.5-10000-1 OTIC suspension Use 6-8 drops to affected ear 3 to 4 x /day (Patient taking differently: as needed. Use 6-8 drops to affected ear 3 to 4 x /day)  . VITAMIN D PO Take 10,000 Units by mouth daily. Takes 10,000 units three times a week  . doxycycline (VIBRAMYCIN) 100 MG capsule Take 1 capsule 2 x/day with food for 5 days -  then 1 x/day with food for 10 days  . predniSONE (DELTASONE) 20 MG tablet 2 tablets daily for 3 days, 1 tablet daily for 4 days.   No current facility-administered medications on file prior to visit.     Allergies:  Allergies  Allergen Reactions  . Atorvastatin Other (See Comments)    MYALGIAS CRAMPING  . Crestor [Rosuvastatin]     MYALGIAS CRAMPING  . Statins     MYALGIAS CRAMPING  .  Tetanus Toxoids     UNSPECIFIED REACTION      Medical History:  Past Medical History:  Diagnosis Date  . Arthritis   . BPH (benign prostatic hyperplasia)   . CAD S/P percutaneous coronary angioplasty 12/2009; 08/2010   a) 6/'22 - NSTEMI: PCI to LAD: Promus Element 2.5 mm x 15 mm DES; b) Cath for Angina: Prox LAD 60-70% pre-stent with FFR 0.82, 90% RVM -- Med Rx, EF 50-55%  . Essential hypertension   . Hyperlipidemia with target LDL less than 70    Statin intolerance  . Non-ST elevation MI (NSTEMI) [facility]) June 2011   PCI to LAD  . Pneumonia    DEC 2016  TX WITH ANTIBIOTIC  . Type 2 diabetes mellitus without complications (HCC)    Family history- Reviewed and unchanged Social history- Reviewed and unchanged   Review of Systems:  Review of Systems  Constitutional: Negative for malaise/fatigue and weight loss.   HENT: Negative for hearing loss and tinnitus.   Eyes: Negative for blurred vision and double vision.  Respiratory: Negative for cough, shortness of breath and wheezing.   Cardiovascular: Negative for chest pain, palpitations, orthopnea, claudication and leg swelling.  Gastrointestinal: Negative for abdominal pain, blood in stool, constipation, diarrhea, heartburn, melena, nausea and vomiting.  Genitourinary: Negative.   Musculoskeletal: Negative for joint pain and myalgias.  Skin: Negative for rash.  Neurological: Negative for dizziness, tingling, sensory change, weakness and headaches.  Endo/Heme/Allergies: Negative for polydipsia.  Psychiatric/Behavioral: Positive for memory loss. Negative for depression and substance abuse. The patient is not nervous/anxious and does not have insomnia.   All other systems reviewed and are negative.   Physical Exam: BP 120/64   Pulse (!) 53   Temp (!) 97.3 F (36.3 C)   Ht 5\' 10"  (1.778 m)   Wt 190 lb (86.2 kg)   SpO2 96%   BMI 27.26 kg/m  Wt Readings from Last 3 Encounters:  04/12/20 190 lb (86.2 kg)  03/05/20 190 lb 6.4 oz (86.4 kg)  01/04/20 187 lb 12.8 oz (85.2 kg)   General Appearance: Well nourished, in no apparent distress. Eyes: PERRLA, EOMs, conjunctiva no swelling or erythema Sinuses: No Frontal/maxillary tenderness ENT/Mouth: Ext aud canals clear, TMs without erythema, bulging. No erythema, swelling, or exudate on post pharynx.  Tonsils not swollen or erythematous. Wearing bil hearing aids.  Neck: Supple, thyroid normal.  Respiratory: Respiratory effort normal, BS equal bilaterally without rales, rhonchi, wheezing or stridor.  Cardio: RRR with no MRGs. Brisk peripheral pulses without edema.  Abdomen: Soft, + BS.  Non tender, no guarding, rebound, hernias, masses. Lymphatics: Non tender without lymphadenopathy.  Musculoskeletal: Full ROM, 5/5 strength, Normal gait Skin: Warm, dry without rashes, ecchymosis.  Neuro: Cranial nerves  intact. No cerebellar symptoms.  Psych: Awake and oriented X 3, normal affect, Insight and Judgment appropriate.    Izora Ribas, NP 10:53 AM [facility]

## 2020-04-12 ENCOUNTER — Encounter: Payer: Self-pay | Admitting: Adult Health

## 2020-04-12 ENCOUNTER — Other Ambulatory Visit: Payer: Self-pay

## 2020-04-12 ENCOUNTER — Ambulatory Visit (INDEPENDENT_AMBULATORY_CARE_PROVIDER_SITE_OTHER): Payer: Medicare Other | Admitting: Adult Health

## 2020-04-12 VITALS — BP 120/64 | HR 53 | Temp 97.3°F | Ht 70.0 in | Wt 190.0 lb

## 2020-04-12 DIAGNOSIS — I2510 Atherosclerotic heart disease of native coronary artery without angina pectoris: Secondary | ICD-10-CM

## 2020-04-12 DIAGNOSIS — E663 Overweight: Secondary | ICD-10-CM

## 2020-04-12 DIAGNOSIS — E785 Hyperlipidemia, unspecified: Secondary | ICD-10-CM | POA: Diagnosis not present

## 2020-04-12 DIAGNOSIS — Z9861 Coronary angioplasty status: Secondary | ICD-10-CM | POA: Diagnosis not present

## 2020-04-12 DIAGNOSIS — E559 Vitamin D deficiency, unspecified: Secondary | ICD-10-CM

## 2020-04-12 DIAGNOSIS — F17200 Nicotine dependence, unspecified, uncomplicated: Secondary | ICD-10-CM | POA: Diagnosis not present

## 2020-04-12 DIAGNOSIS — E538 Deficiency of other specified B group vitamins: Secondary | ICD-10-CM | POA: Diagnosis not present

## 2020-04-12 DIAGNOSIS — E1121 Type 2 diabetes mellitus with diabetic nephropathy: Secondary | ICD-10-CM | POA: Diagnosis not present

## 2020-04-12 DIAGNOSIS — E11319 Type 2 diabetes mellitus with unspecified diabetic retinopathy without macular edema: Secondary | ICD-10-CM

## 2020-04-12 DIAGNOSIS — R413 Other amnesia: Secondary | ICD-10-CM

## 2020-04-12 DIAGNOSIS — Z79899 Other long term (current) drug therapy: Secondary | ICD-10-CM

## 2020-04-12 DIAGNOSIS — I700 Atherosclerosis of aorta: Secondary | ICD-10-CM | POA: Diagnosis not present

## 2020-04-12 DIAGNOSIS — I10 Essential (primary) hypertension: Secondary | ICD-10-CM

## 2020-04-12 DIAGNOSIS — E1169 Type 2 diabetes mellitus with other specified complication: Secondary | ICD-10-CM | POA: Diagnosis not present

## 2020-04-12 NOTE — Patient Instructions (Addendum)
Goals    . Exercise 150 min/wk Moderate Activity     Aim for 15 min twice daily - ideally go fast enough to not be able to complete a full sentence without taking a breath    . Quit Smoking         Claritin at night Might add flonase (fluticane)  - 1-2 sprays in each nostril at night   Mucinex (guaifenacin) would help more with chest secretions      A great goal to work towards is aiming to get in a serving daily of some of the most nutritionally dense foods - G- BOMBS daily             High-Fiber Diet Fiber, also called dietary fiber, is a type of carbohydrate that is found in fruits, vegetables, whole grains, and beans. A high-fiber diet can have many health benefits. Your health care provider may recommend a high-fiber diet to help:  Prevent constipation. Fiber can make your bowel movements more regular.  Lower your cholesterol.  Relieve the following conditions: ? Swelling of veins in the anus (hemorrhoids). ? Swelling and irritation (inflammation) of specific areas of the digestive tract (uncomplicated diverticulosis). ? A problem of the large intestine (colon) that sometimes causes pain and diarrhea (irritable bowel syndrome, IBS).  Prevent overeating as part of a weight-loss plan.  Prevent heart disease, type 2 diabetes, and certain cancers. What is my plan? The recommended daily fiber intake in grams (g) includes:  38 g for men age 11 or younger.  30 g for men over age 5.  68 g for women age 52 or younger.  21 g for women over age 56. You can get the recommended daily intake of dietary fiber by:  Eating a variety of fruits, vegetables, grains, and beans.  Taking a fiber supplement, if it is not possible to get enough fiber through your diet. What do I need to know about a high-fiber diet?  It is better to get fiber through food sources rather than from fiber supplements. There is not a lot of research about how effective supplements  are.  Always check the fiber content on the nutrition facts label of any prepackaged food. Look for foods that contain 5 g of fiber or more per serving.  Talk with a diet and nutrition specialist (dietitian) if you have questions about specific foods that are recommended or not recommended for your medical condition, especially if those foods are not listed below.  Gradually increase how much fiber you consume. If you increase your intake of dietary fiber too quickly, you may have bloating, cramping, or gas.  Drink plenty of water. Water helps you to digest fiber. What are tips for following this plan?  Eat a wide variety of high-fiber foods.  Make sure that half of the grains that you eat each day are whole grains.  Eat breads and cereals that are made with whole-grain flour instead of refined flour or white flour.  Eat brown rice, bulgur wheat, or millet instead of white rice.  Start the day with a breakfast that is high in fiber, such as a cereal that contains 5 g of fiber or more per serving.  Use beans in place of meat in soups, salads, and pasta dishes.  Eat high-fiber snacks, such as berries, raw vegetables, nuts, and popcorn.  Choose whole fruits and vegetables instead of processed forms like juice or sauce. What foods can I eat?  Fruits Berries. Pears. Apples. Oranges. Avocado.  Prunes and raisins. Dried figs. Vegetables Sweet potatoes. Spinach. Kale. Artichokes. Cabbage. Broccoli. Cauliflower. Green peas. Carrots. Squash. Grains Whole-grain breads. Multigrain cereal. Oats and oatmeal. Brown rice. Barley. Bulgur wheat. [place]. Quinoa. Bran muffins. Popcorn. Rye wafer crackers. Meats and other proteins Navy, kidney, and pinto beans. Soybeans. Split peas. Lentils. Nuts and seeds. Dairy Fiber-fortified yogurt. Beverages Fiber-fortified soy milk. Fiber-fortified orange juice. Other foods Fiber bars. The items listed above may not be a complete list of recommended foods  and beverages. Contact a dietitian for more options. What foods are not recommended? Fruits Fruit juice. Cooked, strained fruit. Vegetables Fried potatoes. Canned vegetables. Well-cooked vegetables. Grains White bread. Pasta made with refined flour. White rice. Meats and other proteins Fatty cuts of meat. Fried chicken or fried fish. Dairy Milk. Yogurt. Cream cheese. Sour cream. Fats and oils Butters. Beverages Soft drinks. Other foods Cakes and pastries. The items listed above may not be a complete list of foods and beverages to avoid. Contact a dietitian for more information. Summary  Fiber is a type of carbohydrate. It is found in fruits, vegetables, whole grains, and beans.  There are many health benefits of eating a high-fiber diet, such as preventing constipation, lowering blood cholesterol, helping with weight loss, and reducing your risk of heart disease, diabetes, and certain cancers.  Gradually increase your intake of fiber. Increasing too fast can result in cramping, bloating, and gas. Drink plenty of water while you increase your fiber.  The best sources of fiber include whole fruits and vegetables, whole grains, nuts, seeds, and beans. This information is not intended to replace advice given to you by your health care provider. Make sure you discuss any questions you have with your health care provider. Document Revised: 05/11/2017 Document Reviewed: 05/11/2017 Elsevier Patient Education  2020 [employer].

## 2020-04-13 ENCOUNTER — Encounter: Payer: Self-pay | Admitting: Adult Health

## 2020-04-13 DIAGNOSIS — E538 Deficiency of other specified B group vitamins: Secondary | ICD-10-CM | POA: Insufficient documentation

## 2020-04-13 LAB — COMPLETE METABOLIC PANEL WITH GFR
AG Ratio: 1.8 (calc) (ref 1.0–2.5)
ALT: 8 U/L — ABNORMAL LOW (ref 9–46)
AST: 12 U/L (ref 10–35)
Albumin: 4.3 g/dL (ref 3.6–5.1)
Alkaline phosphatase (APISO): 44 U/L (ref 35–144)
BUN: 14 mg/dL (ref 7–25)
CO2: 29 mmol/L (ref 20–32)
Calcium: 9.7 mg/dL (ref 8.6–10.3)
Chloride: 103 mmol/L (ref 98–110)
Creat: 0.85 mg/dL (ref 0.70–1.11)
GFR, Est African American: 94 mL/min/{1.73_m2} (ref >=60)
GFR, Est Non African American: 81 mL/min/{1.73_m2} (ref >=60)
Globulin: 2.4 g/dL (calc) (ref 1.9–3.7)
Glucose, Bld: 89 mg/dL (ref 65–99)
Potassium: 4.6 mmol/L (ref 3.5–5.3)
Sodium: 139 mmol/L (ref 135–146)
Total Bilirubin: 0.5 mg/dL (ref 0.2–1.2)
Total Protein: 6.7 g/dL (ref 6.1–8.1)

## 2020-04-13 LAB — CBC WITH DIFFERENTIAL/PLATELET
Absolute Monocytes: 832 cells/uL (ref 200–950)
Basophils Absolute: 73 cells/uL (ref 0–200)
Basophils Relative: 1 %
Eosinophils Absolute: 321 cells/uL (ref 15–500)
Eosinophils Relative: 4.4 %
HCT: 44.7 % (ref 38.5–50.0)
Hemoglobin: 14.8 g/dL (ref 13.2–17.1)
Lymphs Abs: 1650 cells/uL (ref 850–3900)
MCH: 31.6 pg (ref 27.0–33.0)
MCHC: 33.1 g/dL (ref 32.0–36.0)
MCV: 95.3 fL (ref 80.0–100.0)
MPV: 11 fL (ref 7.5–12.5)
Monocytes Relative: 11.4 %
Neutro Abs: 4424 cells/uL (ref 1500–7800)
Neutrophils Relative %: 60.6 %
Platelets: 264 10*3/uL (ref 140–400)
RBC: 4.69 10*6/uL (ref 4.20–5.80)
RDW: 12.1 % (ref 11.0–15.0)
Total Lymphocyte: 22.6 %
WBC: 7.3 10*3/uL (ref 3.8–10.8)

## 2020-04-13 LAB — LIPID PANEL
Cholesterol: 129 mg/dL (ref <200)
HDL: 57 mg/dL (ref >=40)
LDL Cholesterol (Calc): 57 mg/dL (calc)
Non-HDL Cholesterol (Calc): 72 mg/dL (calc) (ref <130)
Total CHOL/HDL Ratio: 2.3 (calc) (ref <5.0)
Triglycerides: 71 mg/dL (ref <150)

## 2020-04-13 LAB — VITAMIN B12: Vitamin B-12: 275 pg/mL (ref 200–1100)

## 2020-04-13 LAB — MAGNESIUM: Magnesium: 2.1 mg/dL (ref 1.5–2.5)

## 2020-04-13 LAB — TSH: TSH: 2.39 mIU/L (ref 0.40–4.50)

## 2020-04-13 LAB — HEMOGLOBIN A1C
Hgb A1c MFr Bld: 5.6 % of total Hgb (ref <5.7)
Mean Plasma Glucose: 114 (calc)
eAG (mmol/L): 6.3 (calc)

## 2020-04-13 LAB — RPR: RPR Ser Ql: NONREACTIVE

## 2020-04-16 DIAGNOSIS — M5416 Radiculopathy, lumbar region: Secondary | ICD-10-CM | POA: Diagnosis not present

## 2020-04-25 ENCOUNTER — Other Ambulatory Visit: Payer: Self-pay | Admitting: Cardiology

## 2020-06-25 DIAGNOSIS — M5416 Radiculopathy, lumbar region: Secondary | ICD-10-CM | POA: Diagnosis not present

## 2020-07-16 DIAGNOSIS — Z23 Encounter for immunization: Secondary | ICD-10-CM | POA: Diagnosis not present

## 2020-07-17 ENCOUNTER — Encounter: Payer: Self-pay | Admitting: Internal Medicine

## 2020-07-17 ENCOUNTER — Ambulatory Visit (INDEPENDENT_AMBULATORY_CARE_PROVIDER_SITE_OTHER): Payer: Medicare Other | Admitting: Internal Medicine

## 2020-07-17 ENCOUNTER — Other Ambulatory Visit: Payer: Self-pay

## 2020-07-17 VITALS — BP 112/62 | HR 80 | Temp 97.5°F | Resp 16 | Ht 70.0 in | Wt 189.0 lb

## 2020-07-17 DIAGNOSIS — E559 Vitamin D deficiency, unspecified: Secondary | ICD-10-CM

## 2020-07-17 DIAGNOSIS — I700 Atherosclerosis of aorta: Secondary | ICD-10-CM | POA: Diagnosis not present

## 2020-07-17 DIAGNOSIS — E1122 Type 2 diabetes mellitus with diabetic chronic kidney disease: Secondary | ICD-10-CM | POA: Diagnosis not present

## 2020-07-17 DIAGNOSIS — I10 Essential (primary) hypertension: Secondary | ICD-10-CM

## 2020-07-17 DIAGNOSIS — E1169 Type 2 diabetes mellitus with other specified complication: Secondary | ICD-10-CM | POA: Diagnosis not present

## 2020-07-17 DIAGNOSIS — N182 Chronic kidney disease, stage 2 (mild): Secondary | ICD-10-CM | POA: Diagnosis not present

## 2020-07-17 DIAGNOSIS — Z79899 Other long term (current) drug therapy: Secondary | ICD-10-CM | POA: Diagnosis not present

## 2020-07-17 DIAGNOSIS — E785 Hyperlipidemia, unspecified: Secondary | ICD-10-CM

## 2020-07-17 NOTE — Progress Notes (Signed)
History of Present Illness:       This very nice 84 y.o.  MWM presents for 6  month follow up with HTN, HLD, T2_NIDDM and Vitamin D Deficiency.  In 2017 Abdominal CT scan showed Aortic Atherosclerosis.       Patient is treated for HTN (2000) & BP has been controlled at home. Today's BP is at goal - 112/62.   In 2011, patient underwent PCA/DES to the LAD and has done well since.  Patient has had no complaints of any cardiac type chest pain, palpitations, dyspnea / orthopnea / PND, dizziness, claudication, or dependent edema.      Hyperlipidemia is controlled with diet & Nexletol [place] Picking. Patient denies myalgias or other med SE's. Last Lipids were at goal:  Lab Results  Component Value Date   CHOL 129 04/12/2020   HDL 57 04/12/2020   LDLCALC 57 04/12/2020   TRIG 71 04/12/2020   CHOLHDL 2.3 04/12/2020    Also, the patient has history of T2_NIDDM  (2006) w /CKD2  and has had no symptoms of reactive hypoglycemia, diabetic polys, paresthesias or visual blurring.  Last A1c was normal & at goal:  Lab Results  Component Value Date   HGBA1C 5.6 04/12/2020       Further, the patient also has history of Vitamin D Deficiency ("22" /2008) and supplements vitamin D without any suspected side-effects. Last vitamin D was at goal:  Lab Results  Component Value Date   VD25OH 67 01/04/2020    Current Outpatient Medications on File Prior to Visit  Medication Sig  . aspirin EC 81 MG tablet Take daily.  Marland Kitchen CINNAMON 1,000 mg  Take daily.   Marland Kitchen doxazosin (CARDURA) 4 MG tablet TAKE 1 TABLET AT BEDTIME  . ezetimibe (ZETIA) 10 MG tablet TAKE 1 TABLET EVERY DAY  . finasteride (PROSCAR) 5 MG tablet Take 5 mg  daily.  Marland Kitchen lisinopril 5 MG tablet TAKE 1 TABLET  DAILY.  . Magnesium 500 MG CAPS Take 1 capsule  daily.  . metFORMIN-XR 500 MG 24 hr tablet TAKE 2 TABs  2 x /day w/meals   . CORTISPORIN OTIC suspension  Use 6-8 drops to affected ear 3 to 4 x /day  . NEXLETOL 180 MG TABS TAKE 1 TABLET  DAILY.   Marland Kitchen VITAMIN D PO Take 10,000 Units  three times a week     Allergies  Allergen Reactions  . Atorvastatin Other (See Comments)    MYALGIAS CRAMPING  . Crestor [Rosuvastatin]     MYALGIAS CRAMPING  . Statins     MYALGIAS CRAMPING  . Tetanus Toxoids     UNSPECIFIED REACTION     PMHx:   Past Medical History:  Diagnosis Date  . Arthritis   . BPH (benign prostatic hyperplasia)   . CAD S/P percutaneous coronary angioplasty 12/2009; 08/2010   a) 6/'22 - NSTEMI: PCI to LAD: Promus Element 2.5 mm x 15 mm DES; b) Cath for Angina: Prox LAD 60-70% pre-stent with FFR 0.82, 90% RVM -- Med Rx, EF 50-55%  . Essential hypertension   . Hyperlipidemia with target LDL less than 70    Statin intolerance  . Non-ST elevation MI (NSTEMI) [facility]) June 2011   PCI to LAD  . Pneumonia    DEC 2016  TX WITH ANTIBIOTIC  . Type 2 diabetes mellitus without complications (HCC)     Immunization History  Administered Date(s) Administered  . Influenza, High Dose Seasonal PF 06/01/2013, 04/18/2014, 05/23/2015, 05/20/2016,  04/10/2017, 05/13/2018, 05/10/2019  . Influenza-Unspecified 05/13/2011  . PFIZER SARS-COV-2 Vaccination 08/17/2019, 09/19/2019  . Pneumococcal Conjugate-13 04/18/2014  . Pneumococcal Polysaccharide-23 07/21/2002, 05/23/2015  . Td 07/21/2000    Past Surgical History:  Procedure Laterality Date  . CARDIAC CATHETERIZATION  12/2009   Proximal LAD stenosis followed by a significant 80-90% distal stenosis  . CARDIAC CATHETERIZATION  February 2012    90% ostial RV marginal branch; 60-70% proximal LAD with widely patent distal stent. FFR 0.82; medical therapy  . CATARACT EXTRACTION W/ INTRAOCULAR LENS  IMPLANT, BILATERAL    . CORONARY ANGIOPLASTY  12/2009   PTCA to proximal LAD; PCI with Promus Element DES stent  2.5 mm x 15 mm  distalLAD - .for non-ST elevation MI  . DENTAL SURGERY    . LUMBAR LAMINECTOMY/DECOMPRESSION MICRODISCECTOMY N/A 08/09/2015   Procedure: Lumbar Four-Five, Lumbar Five-  Sacral One Decompressive Lumbar Laminectomy with Resection of Synovial Cyst;  Surgeon: Erline Levine, MD;  Location: [facility];  Service: Neurosurgery;  Laterality: N/A;  L4 to S1 Decompressive Lumbar Laminectomy  . LUMBAR LAMINECTOMY/DECOMPRESSION MICRODISCECTOMY Right 01/21/2016   Procedure: Redo Right Lumbar Five-Sacral One Laminectomy for synovial cyst;  Surgeon: Erline Levine, MD;  Location: [facility];  Service: Neurosurgery;  Laterality: Right;  right  . LUMBAR LAMINECTOMY/DECOMPRESSION MICRODISCECTOMY Right 10/08/2017   Procedure: Right Lumbar three-four Laminectomy for synovial cyst;  Surgeon: Erline Levine, MD;  Location: [facility];  Service: Neurosurgery;  Laterality: Right;  . NASAL FRACTURE SURGERY    . TRANSTHORACIC ECHOCARDIOGRAM  June 2011   - (EF not reported) Moderately dilated LV; moderate hypokinesis of anteroseptal and anterior wall consistent with MI. Grade 1 diastolic dysfunction. Mild to moderately dilated LA  . VASECTOMY      FHx:    Reviewed / unchanged  SHx:    Reviewed / unchanged   Systems Review:  Constitutional: Denies fever, chills, wt changes, headaches, insomnia, fatigue, night sweats, change in appetite. Eyes: Denies redness, blurred vision, diplopia, discharge, itchy, watery eyes.  ENT: Denies discharge, congestion, post nasal drip, epistaxis, sore throat, earache, hearing loss, dental pain, tinnitus, vertigo, sinus pain, snoring.  CV: Denies chest pain, palpitations, irregular heartbeat, syncope, dyspnea, diaphoresis, orthopnea, PND, claudication or edema. Respiratory: denies cough, dyspnea, DOE, pleurisy, hoarseness, laryngitis, wheezing.  Gastrointestinal: Denies dysphagia, odynophagia, heartburn, reflux, water brash, abdominal pain or cramps, nausea, vomiting, bloating, diarrhea, constipation, hematemesis, melena, hematochezia  or hemorrhoids. Genitourinary: Denies dysuria, frequency, urgency, nocturia, hesitancy, discharge, hematuria or flank  pain. Musculoskeletal: Denies arthralgias, myalgias, stiffness, jt. swelling, pain, limping or strain/sprain.  Skin: Denies pruritus, rash, hives, warts, acne, eczema or change in skin lesion(s). Neuro: No weakness, tremor, incoordination, spasms, paresthesia or pain. Psychiatric: Denies confusion, memory loss or sensory loss. Endo: Denies change in weight, skin or hair change.  Heme/Lymph: No excessive bleeding, bruising or enlarged lymph nodes.  Physical Exam  BP 112/62   Pulse 80   Temp (!) 97.5 F (36.4 C)   Resp 16   Ht 5\' 10"  (1.778 m)   Wt 189 lb (85.7 kg)   SpO2 95%   BMI 27.12 kg/m   Appears  well nourished, well groomed  and in no distress.  Eyes: PERRLA, EOMs, conjunctiva no swelling or erythema. Sinuses: No frontal/maxillary tenderness ENT/Mouth: EAC's clear, TM's nl w/o erythema, bulging. Nares clear w/o erythema, swelling, exudates. Oropharynx clear without erythema or exudates. Oral hygiene is good. Tongue normal, non obstructing. Hearing intact.  Neck: Supple. Thyroid not palpable. Car 2+/2+ without bruits, nodes  or JVD. Chest: Respirations nl with BS clear & equal w/o rales, rhonchi, wheezing or stridor.  Cor: Heart sounds normal w/ regular rate and rhythm without sig. murmurs, gallops, clicks or rubs. Peripheral pulses normal and equal  without edema.  Abdomen: Soft & bowel sounds normal. Non-tender w/o guarding, rebound, hernias, masses or organomegaly.  Lymphatics: Unremarkable.  Musculoskeletal: Full ROM all peripheral extremities, joint stability, 5/5 strength and normal gait.  Skin: Warm, dry without exposed rashes, lesions or ecchymosis apparent.  Neuro: Cranial nerves intact, reflexes equal bilaterally. Sensory-motor testing grossly intact. Tendon reflexes grossly intact.  Pysch: Alert & oriented x 3.  Insight and judgement nl & appropriate. No ideations.  Assessment and Plan:  1. Essential hypertension  - Continue medication, monitor blood pressure at  home.  - Continue DASH diet.  Reminder to go to the ER if any CP,  SOB, nausea, dizziness, severe HA, changes vision/speech.  - CBC with Differential/Platelet - COMPLETE METABOLIC PANEL WITH GFR - Magnesium - TSH  2. Hyperlipidemia associated with type 2 diabetes mellitus ([place])  - Continue diet/meds, exercise,& lifestyle modifications.  - Continue monitor periodic cholesterol/liver & renal functions   - Lipid panel - TSH  3. Type 2 diabetes mellitus with stage 2 chronic kidney  disease, without long-term current use of insulin (HCC)  - Continue diet, exercise  - Lifestyle modifications.  - Monitor appropriate labs.  - Hemoglobin A1c - Insulin, random  4. Vitamin D deficiency  - Continue supplementation.  - VITAMIN D 25 Hydroxyl  5. Aortic atherosclerosis ([place]) by CT scan 2017  - Lipid panel  6. Medication management  - CBC with Differential/Platelet - COMPLETE METABOLIC PANEL WITH GFR - Magnesium - Lipid panel - TSH - Hemoglobin A1c - Insulin, random - VITAMIN D 25 Hydroxy        Discussed  regular exercise, BP monitoring, weight control to achieve/maintain BMI less than 25 and discussed med and SE's. Recommended labs to assess and monitor clinical status with further disposition pending results of labs.  I discussed the assessment and treatment plan with the patient. The patient was provided an opportunity to ask questions and all were answered. The patient agreed with the plan and demonstrated an understanding of the instructions.  I provided over 30 minutes of exam, counseling, chart review and  complex critical decision making.   Kirtland Bouchard, MD

## 2020-07-17 NOTE — Patient Instructions (Signed)

## 2020-07-18 LAB — COMPLETE METABOLIC PANEL WITH GFR
AG Ratio: 1.8 (calc) (ref 1.0–2.5)
ALT: 8 U/L — ABNORMAL LOW (ref 9–46)
AST: 14 U/L (ref 10–35)
Albumin: 4.3 g/dL (ref 3.6–5.1)
Alkaline phosphatase (APISO): 50 U/L (ref 35–144)
BUN: 12 mg/dL (ref 7–25)
CO2: 31 mmol/L (ref 20–32)
Calcium: 10 mg/dL (ref 8.6–10.3)
Chloride: 103 mmol/L (ref 98–110)
Creat: 0.8 mg/dL (ref 0.70–1.11)
GFR, Est African American: 96 mL/min/{1.73_m2} (ref >=60)
GFR, Est Non African American: 83 mL/min/{1.73_m2} (ref >=60)
Globulin: 2.4 g/dL (calc) (ref 1.9–3.7)
Glucose, Bld: 79 mg/dL (ref 65–99)
Potassium: 4.3 mmol/L (ref 3.5–5.3)
Sodium: 140 mmol/L (ref 135–146)
Total Bilirubin: 0.9 mg/dL (ref 0.2–1.2)
Total Protein: 6.7 g/dL (ref 6.1–8.1)

## 2020-07-18 LAB — CBC WITH DIFFERENTIAL/PLATELET
Absolute Monocytes: 976 cells/uL — ABNORMAL HIGH (ref 200–950)
Basophils Absolute: 48 cells/uL (ref 0–200)
Basophils Relative: 0.6 %
Eosinophils Absolute: 376 cells/uL (ref 15–500)
Eosinophils Relative: 4.7 %
HCT: 44.9 % (ref 38.5–50.0)
Hemoglobin: 15.1 g/dL (ref 13.2–17.1)
Lymphs Abs: 960 cells/uL (ref 850–3900)
MCH: 31.9 pg (ref 27.0–33.0)
MCHC: 33.6 g/dL (ref 32.0–36.0)
MCV: 94.7 fL (ref 80.0–100.0)
MPV: 11 fL (ref 7.5–12.5)
Monocytes Relative: 12.2 %
Neutro Abs: 5640 cells/uL (ref 1500–7800)
Neutrophils Relative %: 70.5 %
Platelets: 261 10*3/uL (ref 140–400)
RBC: 4.74 10*6/uL (ref 4.20–5.80)
RDW: 11.8 % (ref 11.0–15.0)
Total Lymphocyte: 12 %
WBC: 8 10*3/uL (ref 3.8–10.8)

## 2020-07-18 LAB — LIPID PANEL
Cholesterol: 134 mg/dL (ref <200)
HDL: 53 mg/dL (ref >=40)
LDL Cholesterol (Calc): 64 mg/dL (calc)
Non-HDL Cholesterol (Calc): 81 mg/dL (calc) (ref <130)
Total CHOL/HDL Ratio: 2.5 (calc) (ref <5.0)
Triglycerides: 91 mg/dL (ref <150)

## 2020-07-18 LAB — INSULIN, RANDOM: Insulin: 6.2 u[IU]/mL

## 2020-07-18 LAB — HEMOGLOBIN A1C
Hgb A1c MFr Bld: 5.8 % of total Hgb — ABNORMAL HIGH (ref <5.7)
Mean Plasma Glucose: 120 mg/dL
eAG (mmol/L): 6.6 mmol/L

## 2020-07-18 LAB — VITAMIN D 25 HYDROXY (VIT D DEFICIENCY, FRACTURES): Vit D, 25-Hydroxy: 71 ng/mL (ref 30–100)

## 2020-07-18 LAB — TSH: TSH: 2.11 mIU/L (ref 0.40–4.50)

## 2020-07-18 LAB — MAGNESIUM: Magnesium: 2.1 mg/dL (ref 1.5–2.5)

## 2020-07-18 NOTE — Progress Notes (Signed)
========================================================== -   Test results slightly outside the reference range are not unusual. If there is anything important, I will review this with you,  otherwise it is considered normal test values.  If you have further questions,  please do not hesitate to contact me at the office or via My Chart.  ==========================================================  -  Kidney Functions - Normal >>> Excellent !  ==========================================================  -  Total Chol = 134   and LDL = 64   -   Both  Excellent   - Very low risk for Heart Attack  / Stroke ========================================================  - A1c = 5.8% -  borderline elevated sugar  - Avoid Sweets, Candy & White Stuff   - Rice, Potatoes, Breads &  Pasta ==========================================================  -  Vitamin D = 71 - Excellent  ==========================================================  -  All Else - CBC - Electrolytes - Liver - Magnesium & Thyroid    - all  Normal / OK ===========================================================   - Keep up the [place] Work !  -

## 2020-07-23 ENCOUNTER — Ambulatory Visit: Payer: Medicare Other | Admitting: Cardiology

## 2020-07-30 ENCOUNTER — Telehealth: Payer: Self-pay | Admitting: *Deleted

## 2020-07-30 NOTE — Telephone Encounter (Signed)
Informed covermymeds of PA request for Nexletol, which is being prescribed by cardiologist, Dr Glenetta Hew.

## 2020-08-03 ENCOUNTER — Other Ambulatory Visit: Payer: Self-pay | Admitting: Cardiology

## 2020-08-13 ENCOUNTER — Telehealth: Payer: Self-pay | Admitting: Cardiology

## 2020-08-13 NOTE — Telephone Encounter (Signed)
     Pt c/o medication issue:  1. Name of Medication:   NEXLETOL 180 MG TABS    2. How are you currently taking this medication (dosage and times per day)? TAKE 1 TABLET BY MOUTH DAILY.  3. Are you having a reaction (difficulty breathing--STAT)?   4. What is your medication issue? Pt said requested for Dr. Ellyn Hack to send [facility] discount for this meds

## 2020-08-13 NOTE — Telephone Encounter (Signed)
**Note De-Identified  Obfuscation** I called the pt and attempted numerous times to explain that per [facility] he owes a deductible which is his oop cost for the year 2022 prior to his ins covering at normal cost again.  He kept asking me who was going to give him his deductible and I advised him each time that he owes a deductible. He asked me to s/w his wife David [name] and I agreed. I explained the situation to [place] and she states that she understands what I am telling them and that she will explain it to the pt.  David [name] is aware that Nexiletol is a Tier 3 med and that once the pt reaches his deductible amount his cost will go back to prior cost. David [name] thanked me for calling them and taking time to discuss all of this with them.

## 2020-08-13 NOTE — Telephone Encounter (Signed)
**Note De-Identified  Obfuscation** I called [facility] and was advised that a PA is not required as the pts ins plan is paying their part for his Nexletol. They state that his cost is currently $427.22 and that the cost without ins coverage would be $456 as Nexiltol is a tier 3 med and he has a high deductible.

## 2020-08-13 NOTE — Telephone Encounter (Signed)
Pt called stating he was informed that he need a prior authorization for Nexletol.   Will route to nurse and PA department.

## 2020-08-14 ENCOUNTER — Other Ambulatory Visit: Payer: Self-pay | Admitting: Internal Medicine

## 2020-08-14 MED ORDER — METFORMIN HCL ER 500 MG PO TB24: ORAL_TABLET | ORAL | 0 refills | Status: DC

## 2020-08-22 ENCOUNTER — Ambulatory Visit (INDEPENDENT_AMBULATORY_CARE_PROVIDER_SITE_OTHER): Payer: Medicare Other | Admitting: Cardiology

## 2020-08-22 ENCOUNTER — Encounter: Payer: Self-pay | Admitting: Cardiology

## 2020-08-22 ENCOUNTER — Other Ambulatory Visit: Payer: Self-pay

## 2020-08-22 VITALS — BP 126/60 | HR 76 | Ht 70.0 in | Wt 190.0 lb

## 2020-08-22 DIAGNOSIS — R413 Other amnesia: Secondary | ICD-10-CM

## 2020-08-22 DIAGNOSIS — T466X5A Adverse effect of antihyperlipidemic and antiarteriosclerotic drugs, initial encounter: Secondary | ICD-10-CM | POA: Diagnosis not present

## 2020-08-22 DIAGNOSIS — E1169 Type 2 diabetes mellitus with other specified complication: Secondary | ICD-10-CM

## 2020-08-22 DIAGNOSIS — E785 Hyperlipidemia, unspecified: Secondary | ICD-10-CM | POA: Diagnosis not present

## 2020-08-22 DIAGNOSIS — Z9861 Coronary angioplasty status: Secondary | ICD-10-CM

## 2020-08-22 DIAGNOSIS — I10 Essential (primary) hypertension: Secondary | ICD-10-CM

## 2020-08-22 DIAGNOSIS — M791 Myalgia, unspecified site: Secondary | ICD-10-CM | POA: Diagnosis not present

## 2020-08-22 DIAGNOSIS — F17200 Nicotine dependence, unspecified, uncomplicated: Secondary | ICD-10-CM

## 2020-08-22 DIAGNOSIS — I872 Venous insufficiency (chronic) (peripheral): Secondary | ICD-10-CM

## 2020-08-22 DIAGNOSIS — I2510 Atherosclerotic heart disease of native coronary artery without angina pectoris: Secondary | ICD-10-CM | POA: Diagnosis not present

## 2020-08-22 NOTE — Progress Notes (Signed)
Primary Care Provider: [name], MD Cardiologist: Glenetta Hew, MD Electrophysiologist: None  Clinic Note: Chief Complaint  Patient presents with  . Follow-up    12 months.  . Coronary Artery Disease    No angina  . Leg Swelling    More concerned about discoloration and varicose veins.  Minimal swelling.   ===================================  ASSESSMENT/PLAN   Problem List Items Addressed This Visit    CAD S/P percutaneous coronary angioplasty and PCI- DES mid LAD in the setting of non-STEMI - Primary (Chronic)    Quite distant PCI to LAD years ago.  Last evaluated February 2012.  FFR was borderline, but has not had any further symptoms. We recently stopped his Plavix last year.  Now on aspirin alone.  Myalgias with statin, therefore on Zetia Nexletol. Not on beta-blocker because of resting bradycardia.  He is on ACE inhibitor.  Plan: Continue current medications.  Okay to hold aspirin 5 days preop for surgery or procedure.      Relevant Orders   EKG 12-Lead (Completed)   Hyperlipidemia associated with type 2 diabetes mellitus ([place]) (Chronic)    Labs look much better.  We added Nexletol to the Zetia and is LDL is actually at goal.  64 on last check. -> Labs been followed by PCP.   Remains on metformin.  Consider the possibility of SGLT2 inhibitor or GLP 1 agonist at the discretion of PCP.      Essential hypertension (Chronic)    Blood pressure looks great today.  Continue lisinopril.      Tobacco dependency (Chronic)    He smokes a pipe, not very much, and has not stopped.  He has no inclination to stop      Myalgia due to statin (Chronic)    He had pretty significant, limiting myalgia with multiple different statins.  He is able to tolerate Zetia and Nexletol.  Lipids look great.      Venous stasis dermatitis of both lower extremities (Chronic)    He is little concerned about his varicose veins and discoloration which is related to venous stasis  dermatitis.  Explained to him the pathophysiology of this and I recommended foot elevation.  Also support stockings, especially if he is on his feet for long period time.      Memory changes    Slow word recall.  Gradual progression.  Not on statin.  I do not see a medicine that would potentially be related to this.         ===================================  HPI:    CASHTYN TONKS is a 85 y.o. male with a PMH notable for CAD having PCI along with cardiac risk factors (HTN, HLD) below who presents today for annual follow-up. He is a former patient Dr. Aldona Bar. 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   NSTEMI in June of 2011with PCI to the LAD &moderate residual CAD.   Relook catheterization in February 2012 identified a moderate lesion proximal to the LAD stent. FFR 0.82 --> medical management and he has done well since.  KINGSTYN HOLAWAY was last seen on May 30, 2019 -> doing well, no cardiac symptoms.  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  Other than some easy bruising, he really denied anything besides mild lower leg swelling that is stable.  "Swelling from his socks"  Recent Hospitalizations: None  Reviewed  CV studies:    The following studies were  reviewed today: (if available, images/films reviewed: From Epic Chart or Care Everywhere) . None:   Interval History:   PARTHIV MCLEMORE returns here today again still doing pretty well.  He is gotten injections a couple times, he try to avoid redo surgery.  Still has some mild lower extremity swelling that is pretty stable.  Otherwise, he is doing well from cardiac standpoint.  No PND orthopnea to go along with the edema.  He has some varicose veins in venostasis changes that he was concerned about but they are not that tender just he did not know the reason for them being there.  He started notice a little memory loss with  forgetting names and where he dropped things.  Has never gotten lost however.  CV Review of Symptoms (Summary) Cardiovascular ROS: no chest pain or dyspnea on exertion positive for - edema and Shortness of breath if he overexerts himself vigorously, but he is very active and does not notice it with routine activity. negative for - chest pain, irregular heartbeat, orthopnea, palpitations, paroxysmal nocturnal dyspnea, rapid heart rate, shortness of breath or Syncope or near syncope, TIA/amaurosis fugax or claudication.  The patient does not have symptoms concerning for COVID-19 infection (fever, chills, cough, or new shortness of breath).   REVIEWED OF SYSTEMS   If not noted above: Pertinent positives include continued back pain with right-sided radiculopathy.  Right leg tingling with some focal weakness.  Not as bad as last year. Also notes mild insomnia difficulty getting to sleep but not staying asleep. Concerned about foot swelling/skin color change and varicose veins.  I have reviewed and (if needed) personally updated the patient's problem list, medications, allergies, past medical and surgical history, social and family history.   PAST MEDICAL HISTORY   Past Medical History:  Diagnosis Date  . Arthritis   . BPH (benign prostatic hyperplasia)   . CAD S/P percutaneous coronary angioplasty 12/2009; 08/2010   a) 6/'22 - NSTEMI: PCI to LAD: Promus Element 2.5 mm x 15 mm DES; b) Cath for Angina: Prox LAD 60-70% pre-stent with FFR 0.82, 90% RVM -- Med Rx, EF 50-55%  . Essential hypertension   . Hyperlipidemia with target LDL less than 70    Statin intolerance  . Non-ST elevation MI (NSTEMI) [facility]) June 2011   PCI to LAD  . Pneumonia    DEC 2016  TX WITH ANTIBIOTIC  . Type 2 diabetes mellitus without complications ([place])     PAST SURGICAL HISTORY   Past Surgical History:  Procedure Laterality Date  . CARDIAC CATHETERIZATION  12/2009   Proximal LAD stenosis followed by a significant  80-90% distal stenosis  . CARDIAC CATHETERIZATION  February 2012    90% ostial RV marginal branch; 60-70% proximal LAD with widely patent distal stent. FFR 0.82; medical therapy  . CATARACT EXTRACTION W/ INTRAOCULAR LENS  IMPLANT, BILATERAL    . CORONARY ANGIOPLASTY  12/2009   PTCA to proximal LAD; PCI with Promus Element DES stent  2.5 mm x 15 mm  distalLAD - .for non-ST elevation MI  . DENTAL SURGERY    . LUMBAR LAMINECTOMY/DECOMPRESSION MICRODISCECTOMY N/A 08/09/2015   Procedure: Lumbar Four-Five, Lumbar Five- Sacral One Decompressive Lumbar Laminectomy with Resection of Synovial Cyst;  Surgeon: Erline Levine, MD;  Location: [facility];  Service: Neurosurgery;  Laterality: N/A;  L4 to S1 Decompressive Lumbar Laminectomy  . LUMBAR LAMINECTOMY/DECOMPRESSION MICRODISCECTOMY Right 01/21/2016   Procedure: Redo Right Lumbar Five-Sacral One Laminectomy for synovial cyst;  Surgeon: Erline Levine,  MD;  Location: [facility];  Service: Neurosurgery;  Laterality: Right;  right  . LUMBAR LAMINECTOMY/DECOMPRESSION MICRODISCECTOMY Right 10/08/2017   Procedure: Right Lumbar three-four Laminectomy for synovial cyst;  Surgeon: Erline Levine, MD;  Location: [place];  Service: Neurosurgery;  Laterality: Right;  . NASAL FRACTURE SURGERY    . TRANSTHORACIC ECHOCARDIOGRAM  June 2011   - (EF not reported) Moderately dilated LV; moderate hypokinesis of anteroseptal and anterior wall consistent with MI. Grade 1 diastolic dysfunction. Mild to moderately dilated LA  . VASECTOMY      Immunization History  Administered Date(s) Administered  . Influenza, High Dose Seasonal PF 06/01/2013, 04/18/2014, 05/23/2015, 05/20/2016, 04/10/2017, 05/13/2018, 05/10/2019  . Influenza-Unspecified 05/13/2011  . PFIZER(Purple Top)SARS-COV-2 Vaccination 08/17/2019, 09/19/2019  . Pneumococcal Conjugate-13 04/18/2014  . Pneumococcal Polysaccharide-23 07/21/2002, 05/23/2015  . Td 07/21/2000    MEDICATIONS/ALLERGIES   Current Meds   Medication Sig  . aspirin EC 81 MG tablet Take 1 tablet (81 mg total) by mouth daily.  Marland Kitchen CINNAMON PO Take 1,000 mg by mouth daily.   . Cyanocobalamin (VITAMIN B-12 SL) Place 1 tablet under the tongue daily.  Marland Kitchen doxazosin (CARDURA) 4 MG tablet TAKE 1 TABLET AT BEDTIME FOR BP & PROSTATE  . ezetimibe (ZETIA) 10 MG tablet TAKE 1 TABLET BY MOUTH EVERY DAY  . finasteride (PROSCAR) 5 MG tablet Take 5 mg by mouth daily.  Marland Kitchen lisinopril (ZESTRIL) 5 MG tablet TAKE 1 TABLET BY MOUTH DAILY.  . Magnesium 500 MG CAPS Take 1 capsule by mouth daily.  . metFORMIN (GLUCOPHAGE-XR) 500 MG 24 hr tablet Take  2 tablets  2 x /day  with Meals  for Diabetes  . neomycin-polymyxin-hydrocortisone (CORTISPORIN) 3.5-10000-1 OTIC suspension Use 6-8 drops to affected ear 3 to 4 x /day (Patient taking differently: as needed. Use 6-8 drops to affected ear 3 to 4 x /day)  . NEXLETOL 180 MG TABS TAKE 1 TABLET BY MOUTH DAILY.  Marland Kitchen VITAMIN D PO Take 10,000 Units by mouth daily. Takes 10,000 units three times a week    Allergies  Allergen Reactions  . Atorvastatin Other (See Comments)    MYALGIAS CRAMPING  . Crestor [Rosuvastatin]     MYALGIAS CRAMPING  . Statins     MYALGIAS CRAMPING  . Tetanus Toxoids     UNSPECIFIED REACTION     SOCIAL HISTORY/FAMILY HISTORY   Reviewed in Epic:  Pertinent findings:  Social History   Tobacco Use  . Smoking status: Current Every Day Smoker    Years: 60.00    Types: Pipe  . Smokeless tobacco: Never Used  . Tobacco comment: smokes pipe qd  Vaping Use  . Vaping Use: Never used  Substance Use Topics  . Alcohol use: Yes    Alcohol/week: 7.0 standard drinks    Types: 7 Standard drinks or equivalent per week    Comment: drinks 4 ounces of vodka daily  . Drug use: No   Social History   Social History Narrative   He is a married father of 2 with no grandchildren as of yet. He continued to smoke a pipe several times the course of day. He does have an occasional alcoholic  beverage. While not involved a standard exercise routine, he is very active with walking and working in the yard, splitting wood and doing aggressive yard work including Production designer, theatre/television/film.    OBJCTIVE -PE, EKG, labs   Wt Readings from Last 3 Encounters:  08/22/20 190 lb (86.2 kg)  07/17/20 189  lb (85.7 kg)  04/12/20 190 lb (86.2 kg)    Physical Exam: BP 126/60 (BP Location: Left Arm, Patient Position: Sitting, Cuff Size: Normal)   Pulse 76   Ht 5\' 10"  (1.778 m)   Wt 190 lb (86.2 kg)   BMI 27.26 kg/m  Physical Exam Vitals reviewed.  Constitutional:      General: He is not in acute distress.    Appearance: Normal appearance. He is normal weight. He is not ill-appearing or toxic-appearing.     Comments: Overall relatively healthy-appearing gentleman.  Elderly but looks younger than stated age.  HENT:     Head: Normocephalic and atraumatic.  Neck:     Vascular: No carotid bruit, hepatojugular reflux or JVD.  Cardiovascular:     Rate and Rhythm: Normal rate and regular rhythm.  No extrasystoles are present.    Pulses: Normal pulses.     Heart sounds: Heart sounds not distant. No murmur heard. No friction rub. No gallop.   Pulmonary:     Effort: Pulmonary effort is normal.     Breath sounds: Normal breath sounds.  Chest:     Chest wall: No tenderness.  Musculoskeletal:        General: Swelling (Trivial bilateral) present. Normal range of motion.     Cervical back: Normal range of motion and neck supple.  Skin:    Comments: Mild venous stasis dermatitis noted on both shins/calves.  He does have some varicose veins/spider veins.  Neurological:     General: No focal deficit present.     Mental Status: He is alert and oriented to person, place, and time.     Motor: No weakness.     Gait: Gait abnormal (Slow borderline antalgic gait).  Psychiatric:        Mood and Affect: Mood normal.        Behavior: Behavior normal.        Thought Content: Thought content  normal.        Judgment: Judgment normal.      Adult ECG Report  Rate: 76;  Rhythm: normal sinus rhythm and Left-deviation/LAFB.  Nonspecific IVCD with LBBB pattern.;   Narrative Interpretation: Stable  Recent Labs:  Reviewed Lab Results  Component Value Date   CHOL 134 07/17/2020   HDL 53 07/17/2020   LDLCALC 64 07/17/2020   TRIG 91 07/17/2020   CHOLHDL 2.5 07/17/2020   Lab Results  Component Value Date   CREATININE 0.80 07/17/2020   BUN 12 07/17/2020   NA 140 07/17/2020   K 4.3 07/17/2020   CL 103 07/17/2020   CO2 31 07/17/2020   CBC Latest Ref Rng & Units 07/17/2020 04/12/2020 01/04/2020  WBC 3.8 - 10.8 Thousand/uL 8.0 7.3 6.2  Hemoglobin 13.2 - 17.1 g/dL 15.1 14.8 14.7  Hematocrit 38.5 - 50.0 % 44.9 44.7 44.1  Platelets 140 - 400 Thousand/uL 261 264 237    Lab Results  Component Value Date   TSH 2.11 07/17/2020    ==================================================  COVID-19 Education: The signs and symptoms of COVID-19 were discussed with the patient and how to seek care for testing (follow up with PCP or arrange E-visit).   The importance of social distancing and COVID-19 vaccination was discussed today. The patient is practicing social distancing & Masking.  He is indicated that he does not intend to get the booster shot.  He does not believe in it because he was told that he did not take any more shots after the first 2.  He  was told that he cannot get Covid once he got the shots and that is incorrect.  Started lose faith in medicine.  I spent a total of 40minutes with the patient spent in direct patient consultation.  -> We talked for a while about his varicose veins I explained the pathophysiology.  TAVR support hose and foot elevation. Additional time spent with chart review  / charting (studies, outside notes, etc): 15 min Total Time: 37min  Current medicines are reviewed at length with the patient today.  (+/- concerns) N/A  This visit occurred during  the SARS-CoV-2 public health emergency.  Safety protocols were in place, including screening questions prior to the visit, additional usage of staff PPE, and extensive cleaning of exam room while observing appropriate contact time as indicated for disinfecting solutions.  Notice: This dictation was prepared with Dragon dictation along with smaller phrase technology. Any transcriptional errors that result from this process are unintentional and may not be corrected upon review.  Patient Instructions / Medication Changes & Studies & Tests Ordered   Patient Instructions  Medication Instructions:  No changes  *If you need a refill on your cardiac medications before your next appointment, please call your pharmacy*   Lab Work:  Not needed   Testing/Procedures:  Not needed  Follow-Up: At [facility], you and your health needs are our priority.  As part of our continuing mission to provide you with exceptional heart care, we have created designated Provider Care Teams.  These Care Teams include your primary Cardiologist (physician) and Advanced Practice Providers (APPs -  Physician Assistants and Nurse Practitioners) who all work together to provide you with the care you need, when you need it.     Your next appointment:   12 month(s)  The format for your next appointment:   In Person  Provider:   Glenetta Hew, MD     Studies Ordered:   Orders Placed This Encounter  Procedures  . EKG 12-Lead     Glenetta Hew, M.D., M.S. Interventional Cardiologist   Pager # [phone] Phone # [phone] [address] [place], [place] [address]   Thank you for choosing Heartcare at [facility]!!

## 2020-08-22 NOTE — Patient Instructions (Signed)

## 2020-08-30 ENCOUNTER — Encounter: Payer: Self-pay | Admitting: Cardiology

## 2020-08-30 DIAGNOSIS — I872 Venous insufficiency (chronic) (peripheral): Secondary | ICD-10-CM | POA: Insufficient documentation

## 2020-08-30 DIAGNOSIS — M791 Myalgia, unspecified site: Secondary | ICD-10-CM | POA: Insufficient documentation

## 2020-08-30 NOTE — Assessment & Plan Note (Signed)
Quite distant PCI to LAD years ago.  Last evaluated February 2012.  FFR was borderline, but has not had any further symptoms. We recently stopped his Plavix last year.  Now on aspirin alone.  Myalgias with statin, therefore on Zetia Nexletol. Not on beta-blocker because of resting bradycardia.  He is on ACE inhibitor.  Plan: Continue current medications.  Okay to hold aspirin 5 days preop for surgery or procedure.

## 2020-08-30 NOTE — Assessment & Plan Note (Signed)
He smokes a pipe, not very much, and has not stopped.  He has no inclination to stop

## 2020-08-30 NOTE — Assessment & Plan Note (Signed)
Slow word recall.  Gradual progression.  Not on statin.  I do not see a medicine that would potentially be related to this.

## 2020-08-30 NOTE — Assessment & Plan Note (Signed)
He is little concerned about his varicose veins and discoloration which is related to venous stasis dermatitis.  Explained to him the pathophysiology of this and I recommended foot elevation.  Also support stockings, especially if he is on his feet for long period time.

## 2020-08-30 NOTE — Assessment & Plan Note (Signed)
Blood pressure looks great today.  Continue lisinopril.

## 2020-08-30 NOTE — Assessment & Plan Note (Signed)
He had pretty significant, limiting myalgia with multiple different statins.  He is able to tolerate Zetia and Nexletol.  Lipids look great.

## 2020-08-30 NOTE — Assessment & Plan Note (Addendum)
Labs look much better.  We added Nexletol to the Zetia and is LDL is actually at goal.  64 on last check. -> Labs been followed by PCP.   Remains on metformin.  Consider the possibility of SGLT2 inhibitor or GLP 1 agonist at the discretion of PCP.

## 2020-09-24 DIAGNOSIS — M48062 Spinal stenosis, lumbar region with neurogenic claudication: Secondary | ICD-10-CM | POA: Diagnosis not present

## 2020-09-24 DIAGNOSIS — M5416 Radiculopathy, lumbar region: Secondary | ICD-10-CM | POA: Diagnosis not present

## 2020-10-09 ENCOUNTER — Ambulatory Visit: Payer: Medicare Other | Admitting: Adult Health Nurse Practitioner

## 2020-10-18 ENCOUNTER — Ambulatory Visit (INDEPENDENT_AMBULATORY_CARE_PROVIDER_SITE_OTHER): Payer: Medicare Other | Admitting: Adult Health Nurse Practitioner

## 2020-10-18 ENCOUNTER — Other Ambulatory Visit: Payer: Self-pay

## 2020-10-18 ENCOUNTER — Ambulatory Visit
Admission: RE | Admit: 2020-10-18 | Discharge: 2020-10-18 | Disposition: A | Discharge status: Home / Self Care | Payer: Medicare Other | Source: Ambulatory Visit | Attending: Adult Health Nurse Practitioner | Admitting: Adult Health Nurse Practitioner

## 2020-10-18 ENCOUNTER — Encounter: Payer: Self-pay | Admitting: Adult Health Nurse Practitioner

## 2020-10-18 ENCOUNTER — Other Ambulatory Visit: Payer: Self-pay | Admitting: Adult Health Nurse Practitioner

## 2020-10-18 VITALS — BP 126/82 | HR 71 | Temp 97.7°F | Ht 70.0 in | Wt 191.0 lb

## 2020-10-18 DIAGNOSIS — E663 Overweight: Secondary | ICD-10-CM | POA: Diagnosis not present

## 2020-10-18 DIAGNOSIS — I2510 Atherosclerotic heart disease of native coronary artery without angina pectoris: Secondary | ICD-10-CM | POA: Diagnosis not present

## 2020-10-18 DIAGNOSIS — E11319 Type 2 diabetes mellitus with unspecified diabetic retinopathy without macular edema: Secondary | ICD-10-CM | POA: Diagnosis not present

## 2020-10-18 DIAGNOSIS — D692 Other nonthrombocytopenic purpura: Secondary | ICD-10-CM | POA: Diagnosis not present

## 2020-10-18 DIAGNOSIS — R059 Cough, unspecified: Secondary | ICD-10-CM

## 2020-10-18 DIAGNOSIS — N401 Enlarged prostate with lower urinary tract symptoms: Secondary | ICD-10-CM | POA: Diagnosis not present

## 2020-10-18 DIAGNOSIS — Z0000 Encounter for general adult medical examination without abnormal findings: Secondary | ICD-10-CM

## 2020-10-18 DIAGNOSIS — I10 Essential (primary) hypertension: Secondary | ICD-10-CM | POA: Diagnosis not present

## 2020-10-18 DIAGNOSIS — E1121 Type 2 diabetes mellitus with diabetic nephropathy: Secondary | ICD-10-CM

## 2020-10-18 DIAGNOSIS — E559 Vitamin D deficiency, unspecified: Secondary | ICD-10-CM

## 2020-10-18 DIAGNOSIS — I700 Atherosclerosis of aorta: Secondary | ICD-10-CM

## 2020-10-18 DIAGNOSIS — E1122 Type 2 diabetes mellitus with diabetic chronic kidney disease: Secondary | ICD-10-CM

## 2020-10-18 DIAGNOSIS — F17200 Nicotine dependence, unspecified, uncomplicated: Secondary | ICD-10-CM

## 2020-10-18 DIAGNOSIS — R6889 Other general symptoms and signs: Secondary | ICD-10-CM | POA: Diagnosis not present

## 2020-10-18 DIAGNOSIS — Z9861 Coronary angioplasty status: Secondary | ICD-10-CM

## 2020-10-18 DIAGNOSIS — E785 Hyperlipidemia, unspecified: Secondary | ICD-10-CM | POA: Diagnosis not present

## 2020-10-18 DIAGNOSIS — Z79899 Other long term (current) drug therapy: Secondary | ICD-10-CM

## 2020-10-18 DIAGNOSIS — Z0001 Encounter for general adult medical examination with abnormal findings: Secondary | ICD-10-CM | POA: Diagnosis not present

## 2020-10-18 DIAGNOSIS — Z122 Encounter for screening for malignant neoplasm of respiratory organs: Secondary | ICD-10-CM

## 2020-10-18 DIAGNOSIS — R350 Frequency of micturition: Secondary | ICD-10-CM

## 2020-10-18 DIAGNOSIS — R9389 Abnormal findings on diagnostic imaging of other specified body structures: Secondary | ICD-10-CM

## 2020-10-18 DIAGNOSIS — E1169 Type 2 diabetes mellitus with other specified complication: Secondary | ICD-10-CM | POA: Diagnosis not present

## 2020-10-18 DIAGNOSIS — N182 Chronic kidney disease, stage 2 (mild): Secondary | ICD-10-CM | POA: Diagnosis not present

## 2020-10-18 NOTE — Patient Instructions (Addendum)
  INFORMATION ABOUT YOUR XRAY  IMAGING  [phone]  Can walk into [address] building for an Insurance account manager.   They will have the order and take you back. You do not any paper work, I should get the result back today or tomorrow.     Ear care: Both of your ears are clear of wax at this time  For regular maintenance: Use warm or room temp peroxide in ear once every 1-2 weeks, then apply oil that night x1.   Next morning let warm water from shower run into your ears.   This will help flush out any wax Do not use cotton swabs in your ears Should you have pain, decrease in your ability to hear or dizziness please contact the office for an appointment.  *For wax build up Use 2-3 drops of oil for next 3-4 nights.   You can use olive oil or mineral oil This will help to sooth your ear canals.  Use cotton ball to keep in ears while sleeping.  After 4th night of oil, GENERAL HEALTH GOALS  Know what a healthy weight is for you (roughly BMI <25) and aim to maintain this  Aim for 7+ servings of fruits and vegetables daily  70-80+ fluid ounces of water or unsweet tea for healthy kidneys  Limit to max 1 drink of alcohol per day; avoid smoking/tobacco  Limit animal fats in diet for cholesterol and heart health - choose grass fed whenever available  Avoid highly processed foods, and foods high in saturated/trans fats  Aim for low stress - take time to unwind and care for your mental health  Aim for 150 min of moderate intensity exercise weekly for heart health, and weights twice weekly for bone health  Aim for 7-9 hours of sleep daily rinse ears with warm water in shower.

## 2020-10-18 NOTE — Progress Notes (Signed)
MEDICARE ANNUAL WELLNESS VISIT AND FOLLOW UP  Assessment:   David [name] was seen today for follow-up.  Diagnoses and all orders for this visit:  Encounter for Medicare annual wellness exam -Yearly  Essential hypertension Continue current medications: lisinopril 5mg  daily Monitor blood pressure at home; call if consistently over 130/80 Continue DASH diet.   Reminder to go to the ER if any CP, SOB, nausea, dizziness, severe HA, changes vision/speech, left arm numbness and tingling and jaw pain. -     CBC with Differential/Platelet -     COMPLETE METABOLIC PANEL WITH GFR  Hyperlipidemia associated with type 2 diabetes mellitus (HCC) Continue medications: Zetia 10mg  daily and nextletol Discussed dietary and exercise modifications Low fat diet -     Lipid panel  Type 2 diabetes mellitus with stage 2 chronic kidney disease, without long-term current use of insulin (HCC) Continue medications: Metformin 500mg XL two tablets twice a day. Discussed general issues about diabetes pathophysiology and management. Education: Reviewed 'ABCs' of diabetes management (respective goals in parentheses):  A1C (<7), blood pressure (<130/80), and cholesterol (LDL <70) Dietary recommendations Encouraged aerobic exercise.  Discussed foot care, check daily Yearly retinal exam Dental exam every 6 months Monitor blood glucose, discussed goal for patient -     Hemoglobin A1c  Vitamin D deficiency Continue supplementation Taking Vitamin D 10,000 IU three times a week.  CAD S/P percutaneous coronary angioplasty and PCI- DES mid LAD in the setting of non-STEMI  Control blood pressure, lipids and glucose Disscused lifestyle modifications, diet & exercise Continue to monitor Followed by Cardiology  Type II diabetes mellitus with nephropathy ([place]) Doing well at this time Continue to monitor  Aortic atherosclerosis (HCC) Control blood pressure, lipids and glucose Disscused lifestyle modifications, diet &  exercise Continue to monitor  Diabetic retinopathy of both eyes associated with type 2 diabetes mellitus, macular edema presence unspecified, unspecified retinopathy severity (HCC) Aggressively control sugars and BP Followed by ophthalmology  BPH with obstruction/lower urinary tract symptoms Doing well at this time Continue medications: Cardura 4mg , finasteride 5mg . Will continue to monitor Defer PSA this check  Senile purpura ([place])  Discussed process, protect skin, sunscreen  Tobacco dependency Discussed tobacco cessation Not ready to quit at this time  Overweight (BMI 25.0-29.9) - continue medications, stress management techniques discussed, increase water, good sleep hygiene discussed, increase exercise, and increase veggies.   Medication management Continued  Lung cancer screening -X-ray order placed today Aged out of CT low dose screening   Over 40 minutes of face to face interview, exam, counseling, chart review, and critical decision making was performed  Future Appointments  Date Time Provider [place]  02/28/2021  3:00 PM [name], MD [facility] None  10/18/2021 11:30 AM [name], NP [facility] None     Plan:   During the course of the visit the patient was educated and counseled about appropriate screening and preventive services including:    Pneumococcal vaccine   Influenza vaccine  Prevnar 13  Td vaccine  Screening electrocardiogram  Colorectal cancer screening  Diabetes screening  Glaucoma screening  Nutrition counseling    Subjective:  David [name] is a 85 y.o. male who presents for Medicare Annual Wellness Visit and 3 month follow up for HTN, HLD, T2DM, and vitamin D Def.   Reports that he is starting to forget people name.  He is able to recall family.    Patient had back surgery in 09/2017 by Dr. Vertell Limber to have a cyst removed.  Reports he does  still have some pain in his R leg, but has sensation back.  He  is using a cane for mobility. He is getting back injections, last was about one month ago.  He reports he get an injection every three months.  This helps with the pain.  Report some residual weakness with increased use.   BMI is Body mass index is 27.41 kg/m., he has been working on diet, exercise limited. He watches bread and sugar intake.  Wt Readings from Last 3 Encounters:  10/18/20 191 lb (86.6 kg)  08/22/20 190 lb (86.2 kg)  07/17/20 189 lb (85.7 kg)   His blood pressure has been controlled at home, today their BP is BP: 126/82 He does workout, but limited. He denies chest pain, shortness of breath, dizziness.   Smokes pipe daily. He is not ready to cut back or quit.    In 2011 had NSTEMI w/ PCA/DES to LAD.  Doing well and follows with Dr Ellyn Hack.  He is on cholesterol medication (zetia due to statin intolerance) and nextletol denies myalgias. His cholesterol is at goal. The cholesterol last visit was:   Lab Results  Component Value Date   CHOL 143 09/15/2019   HDL 61 09/15/2019   LDLCALC 68 09/15/2019   TRIG 71 09/15/2019   CHOLHDL 2.3 09/15/2019   He has been working on diet and exercise for T2DM controlled on metformin and diet, and denies foot ulcerations, hyperglycemia, hypoglycemia , increased appetite, nausea, paresthesia of the feet, polydipsia, polyuria, visual disturbances, vomiting and weight loss. Last A1C in the office was:  Last A1C was:  Lab Results  Component Value Date   HGBA1C 5.8 (H) 07/17/2020     Lab Results  Component Value Date   GFRNONAA 83 07/17/2020     Patient is on Vitamin D3 supplement for defciency.  Last result was to goal.  Lab Results  Component Value Date   VD25OH 71 07/17/2020     Medication Review: Current Outpatient Medications on File Prior to Visit  Medication Sig Dispense Refill  . aspirin EC 81 MG tablet Take 1 tablet (81 mg total) by mouth daily. 30 tablet 11  . CINNAMON PO Take 1,000 mg by mouth daily.     .  Cyanocobalamin (VITAMIN B-12 SL) Place 1 tablet under the tongue daily.    Marland Kitchen doxazosin (CARDURA) 4 MG tablet TAKE 1 TABLET AT BEDTIME FOR BP & PROSTATE 90 tablet 3  . ezetimibe (ZETIA) 10 MG tablet TAKE 1 TABLET BY MOUTH EVERY DAY 90 tablet 3  . finasteride (PROSCAR) 5 MG tablet Take 5 mg by mouth daily.    Marland Kitchen lisinopril (ZESTRIL) 5 MG tablet TAKE 1 TABLET BY MOUTH DAILY. 90 tablet 3  . Magnesium 500 MG CAPS Take 1 capsule by mouth daily.    . metFORMIN (GLUCOPHAGE-XR) 500 MG 24 hr tablet Take  2 tablets  2 x /day  with Meals  for Diabetes 360 tablet 0  . neomycin-polymyxin-hydrocortisone (CORTISPORIN) 3.5-10000-1 OTIC suspension Use 6-8 drops to affected ear 3 to 4 x /day (Patient taking differently: as needed. Use 6-8 drops to affected ear 3 to 4 x /day) 10 mL 3  . NEXLETOL 180 MG TABS TAKE 1 TABLET BY MOUTH DAILY. 30 tablet 2  . VITAMIN D PO Take 10,000 Units by mouth daily. Takes 10,000 units three times a week     No current facility-administered medications on file prior to visit.    Current Problems (verified) Patient Active Problem List  Diagnosis Date Noted  . Myalgia due to statin 08/30/2020  . Venous stasis dermatitis of both lower extremities 08/30/2020  . B12 deficiency 04/13/2020  . Memory changes 04/12/2020  . Preoperative clearance 05/31/2019  . Retinopathy, diabetic, bilateral ([place]) 02/01/2018  . Aortic atherosclerosis ([place]) by CT scan 2017 01/08/2017  . Synovial cyst of lumbar facet joint 08/09/2015  . BPH (benign prostatic hyperplasia) 11/16/2014  . Vitamin D deficiency 12/25/2013  . Medication management 12/25/2013  . Overweight (BMI 25.0-29.9) 02/23/2013    Class: Chronic  . Tobacco dependency 02/22/2013  . Hyperlipidemia associated with type 2 diabetes mellitus ([place])   . Essential hypertension   . Type II diabetes mellitus with nephropathy ([place])   . CAD S/P percutaneous coronary angioplasty and PCI- DES mid LAD in the setting of non-STEMI 12/31/2009     Screening Tests Immunization History  Administered Date(s) Administered  . Influenza, High Dose Seasonal PF 06/01/2013, 04/18/2014, 05/23/2015, 05/20/2016, 04/10/2017, 05/13/2018, 05/10/2019  . Influenza-Unspecified 05/13/2011  . PFIZER(Purple Top)SARS-COV-2 Vaccination 08/17/2019, 09/19/2019  . Pneumococcal Conjugate-13 04/18/2014  . Pneumococcal Polysaccharide-23 07/21/2002, 05/23/2015  . Td 07/21/2000   Preventative care: Last colonoscopy: 2015  Prior vaccinations: TD or Tdap: 2002, allergy to TDAP/TD Influenza: 2018  Pneumococcal: 2016 Prevnar13: 2015 Shingles/Zostavax: Declined   Names of Other Physician/Practitioners you currently use: 1.  [facility] here for primary care 2. Dr. Herbert Deaner, eye doctor, last visit Due for 2022- report verified and abstracted 3. Judeth Horn, dentist, Due for 2022  Patient Care Team: [name], MD as PCP - General (Internal Medicine) Leonie Man, MD as PCP - Cardiology (Cardiology) Laurence Spates, MD (Inactive) as Consulting Physician (Gastroenterology) Leonie Man, MD as Consulting Physician (Cardiology) Erline Levine, MD as Consulting Physician (Neurosurgery) Carolan Clines, MD (Inactive) as Consulting Physician (Urology)  Allergies Allergies  Allergen Reactions  . Atorvastatin Other (See Comments)    MYALGIAS CRAMPING  . Crestor [Rosuvastatin]     MYALGIAS CRAMPING  . Statins     MYALGIAS CRAMPING  . Tetanus Toxoids     UNSPECIFIED REACTION     SURGICAL HISTORY He  has a past surgical history that includes Cardiac catheterization (12/2009); Coronary angioplasty (12/2009); Cardiac catheterization (February 2012 ); Vasectomy; transthoracic echocardiogram (June 2011); Cataract extraction w/ intraocular lens  implant, bilateral; Dental surgery; Lumbar laminectomy/decompression microdiscectomy (N/A, 08/09/2015); Nasal fracture surgery; Lumbar laminectomy/decompression  microdiscectomy (Right, 01/21/2016); and Lumbar laminectomy/decompression microdiscectomy (Right, 10/08/2017). FAMILY HISTORY His family history includes Cancer in his brother, father, sister, and son; Stroke in his father. SOCIAL HISTORY He  reports that he has been smoking pipe. He has smoked for the past 60.00 years. He has never used smokeless tobacco. He reports current alcohol use of about 7.0 standard drinks of alcohol per week. He reports that he does not use drugs.  MEDICARE WELLNESS OBJECTIVES: Tobacco use: He does smoke a pipe.  counseling given, no intention of quitting Alcohol Current alcohol use: 4 oz of vodka Diet: well balanced Physical activity: Current Exercise Habits: The patient does not participate in regular exercise at present, Exercise limited by: orthopedic condition(s) Cardiac risk factors: Cardiac Risk Factors include: advanced age (>66men, >31 women);male gender;dyslipidemia;hypertension;smoking/ tobacco exposure;sedentary lifestyle;diabetes mellitus Depression/mood screen:   Depression screen [facility] 2/9 09/29/2019  Decreased Interest 0  Down, Depressed, Hopeless 0  PHQ - 2 Score 0    ADLs:  In your present state of health, do you have any difficulty performing the following activities: 09/29/2019 06/05/2019  Hearing? N N  Comment - -  Vision? N N  Difficulty concentrating or making decisions? N N  Walking or climbing stairs? N N  Dressing or bathing? N N  Doing errands, shopping? N -  Preparing Food and eating ? N -  Using the Toilet? N -  In the past six months, have you accidently leaked urine? N -  Do you have problems with loss of bowel control? N -  Managing your Medications? N -  Managing your Finances? N -  Housekeeping or managing your Housekeeping? N -  Some recent data might be hidden     Cognitive Testing  Alert? Yes  Normal Appearance?Yes  Oriented to person? Yes  Place? Yes   Time? Yes  Recall of three objects?  Yes  Can perform simple  calculations? Yes  Displays appropriate judgment?Yes  Can read the correct time from a watch face?Yes  EOL planning: Does Patient Have a Medical Advance Directive?: Yes Type of Advance Directive: [place] will Does patient want to make changes to medical advance directive?: No - Patient declined Copy of [place] in Chart?: No - copy requested   Objective:   Today's Vitals   10/18/20 1118  BP: 126/82  Pulse: 71  Temp: 97.7 F (36.5 C)  SpO2: 98%  Weight: 191 lb (86.6 kg)  Height: 5\' 10"  (1.778 m)   Body mass index is 27.41 kg/m.  General appearance: alert, no distress, WD/WN, male HEENT: normocephalic, sclerae anicteric, TMs pearly, nares patent, no discharge or erythema, pharynx normal Oral cavity: MMM, no lesions Neck: supple, no lymphadenopathy, no thyromegaly, no masses Heart: RRR, normal S1, S2, no murmurs Lungs: Rhonchi bilaterally, some clearing with cough, no wheezes or rales. Abdomen: +bs, soft, non tender, non distended, no masses, no hepatomegaly, no splenomegaly Skin: warm, dry, ecchymosis noted, bilateral arms. Musculoskeletal: nontender, no swelling, no obvious deformity Extremities: no edema, no cyanosis, no clubbing Pulses: 2+ symmetric, upper and lower extremities, normal cap refill Neurological: alert, oriented x 3, CN2-12 intact, strength normal upper extremities and lower extremities, sensation normal throughout, DTRs 2+ throughout, no cerebellar signs, gait slow but steady Psychiatric: normal affect, behavior normal, pleasant     Medicare Attestation I have personally reviewed: The patient's medical and social history Their use of alcohol, tobacco or illicit drugs Their current medications and supplements The patient's functional ability including ADLs,fall risks, home safety risks, cognitive, and hearing and visual impairment Diet and physical activities Evidence for depression or mood disorders  The  patient's weight, height, BMI, and visual acuity have been recorded in the chart.  I have made referrals, counseling, and provided education to the patient based on review of the above and I have provided the patient with a written personalized care plan for preventive services.     Garnet Sierras, Laqueta Jean, DNP [facility] 10/18/2020  12:21 PM

## 2020-10-19 LAB — LIPID PANEL
Cholesterol: 134 mg/dL (ref <200)
HDL: 55 mg/dL (ref >=40)
LDL Cholesterol (Calc): 64 mg/dL (calc)
Non-HDL Cholesterol (Calc): 79 mg/dL (calc) (ref <130)
Total CHOL/HDL Ratio: 2.4 (calc) (ref <5.0)
Triglycerides: 70 mg/dL (ref <150)

## 2020-10-19 LAB — COMPLETE METABOLIC PANEL WITH GFR
AG Ratio: 1.9 (calc) (ref 1.0–2.5)
ALT: 8 U/L — ABNORMAL LOW (ref 9–46)
AST: 13 U/L (ref 10–35)
Albumin: 4.2 g/dL (ref 3.6–5.1)
Alkaline phosphatase (APISO): 44 U/L (ref 35–144)
BUN: 13 mg/dL (ref 7–25)
CO2: 28 mmol/L (ref 20–32)
Calcium: 9.8 mg/dL (ref 8.6–10.3)
Chloride: 106 mmol/L (ref 98–110)
Creat: 0.83 mg/dL (ref 0.70–1.11)
GFR, Est African American: 95 mL/min/{1.73_m2} (ref >=60)
GFR, Est Non African American: 82 mL/min/{1.73_m2} (ref >=60)
Globulin: 2.2 g/dL (calc) (ref 1.9–3.7)
Glucose, Bld: 80 mg/dL (ref 65–99)
Potassium: 4.4 mmol/L (ref 3.5–5.3)
Sodium: 139 mmol/L (ref 135–146)
Total Bilirubin: 0.6 mg/dL (ref 0.2–1.2)
Total Protein: 6.4 g/dL (ref 6.1–8.1)

## 2020-10-19 LAB — CBC WITH DIFFERENTIAL/PLATELET
Absolute Monocytes: 803 cells/uL (ref 200–950)
Basophils Absolute: 73 cells/uL (ref 0–200)
Basophils Relative: 1 %
Eosinophils Absolute: 372 cells/uL (ref 15–500)
Eosinophils Relative: 5.1 %
HCT: 43.3 % (ref 38.5–50.0)
Hemoglobin: 14.3 g/dL (ref 13.2–17.1)
Lymphs Abs: 1672 cells/uL (ref 850–3900)
MCH: 30.9 pg (ref 27.0–33.0)
MCHC: 33 g/dL (ref 32.0–36.0)
MCV: 93.5 fL (ref 80.0–100.0)
MPV: 11 fL (ref 7.5–12.5)
Monocytes Relative: 11 %
Neutro Abs: 4380 cells/uL (ref 1500–7800)
Neutrophils Relative %: 60 %
Platelets: 265 10*3/uL (ref 140–400)
RBC: 4.63 10*6/uL (ref 4.20–5.80)
RDW: 12.4 % (ref 11.0–15.0)
Total Lymphocyte: 22.9 %
WBC: 7.3 10*3/uL (ref 3.8–10.8)

## 2020-10-19 LAB — HEMOGLOBIN A1C
Hgb A1c MFr Bld: 5.7 % of total Hgb — ABNORMAL HIGH (ref <5.7)
Mean Plasma Glucose: 117 mg/dL
eAG (mmol/L): 6.5 mmol/L

## 2020-10-26 DIAGNOSIS — M48062 Spinal stenosis, lumbar region with neurogenic claudication: Secondary | ICD-10-CM | POA: Diagnosis not present

## 2020-10-26 DIAGNOSIS — M5416 Radiculopathy, lumbar region: Secondary | ICD-10-CM | POA: Diagnosis not present

## 2020-10-28 ENCOUNTER — Other Ambulatory Visit: Payer: Self-pay | Admitting: Cardiology

## 2020-11-05 ENCOUNTER — Other Ambulatory Visit: Payer: Self-pay

## 2020-11-05 ENCOUNTER — Ambulatory Visit
Admission: RE | Admit: 2020-11-05 | Discharge: 2020-11-05 | Disposition: A | Discharge status: Home / Self Care | Payer: Medicare Other | Source: Ambulatory Visit | Attending: Adult Health Nurse Practitioner | Admitting: Adult Health Nurse Practitioner

## 2020-11-05 DIAGNOSIS — J479 Bronchiectasis, uncomplicated: Secondary | ICD-10-CM | POA: Diagnosis not present

## 2020-11-05 DIAGNOSIS — I2510 Atherosclerotic heart disease of native coronary artery without angina pectoris: Secondary | ICD-10-CM | POA: Diagnosis not present

## 2020-11-05 DIAGNOSIS — R9389 Abnormal findings on diagnostic imaging of other specified body structures: Secondary | ICD-10-CM

## 2020-11-05 DIAGNOSIS — J432 Centrilobular emphysema: Secondary | ICD-10-CM | POA: Diagnosis not present

## 2020-11-05 DIAGNOSIS — J90 Pleural effusion, not elsewhere classified: Secondary | ICD-10-CM | POA: Diagnosis not present

## 2020-11-07 ENCOUNTER — Other Ambulatory Visit: Payer: Self-pay | Admitting: Internal Medicine

## 2020-11-07 DIAGNOSIS — J470 Bronchiectasis with acute lower respiratory infection: Secondary | ICD-10-CM

## 2020-11-07 MED ORDER — DOXYCYCLINE HYCLATE 100 MG PO CAPS: ORAL_CAPSULE | ORAL | 0 refills | Status: DC

## 2020-11-07 MED ORDER — DEXAMETHASONE 4 MG PO TABS: ORAL_TABLET | ORAL | 0 refills | Status: DC

## 2020-11-07 MED ORDER — BUDESONIDE-FORMOTEROL FUMARATE 160-4.5 MCG/ACT IN AERO: INHALATION_SPRAY | RESPIRATORY_TRACT | 1 refills | Status: DC

## 2020-11-07 MED ORDER — AEROCHAMBER PLUS MISC: 1 refills | Status: DC

## 2020-11-08 ENCOUNTER — Other Ambulatory Visit: Payer: Self-pay | Admitting: Internal Medicine

## 2020-11-08 ENCOUNTER — Ambulatory Visit: Payer: Medicare Other | Admitting: Internal Medicine

## 2020-11-08 ENCOUNTER — Other Ambulatory Visit: Payer: Self-pay

## 2020-11-08 VITALS — BP 134/62 | HR 56 | Temp 97.3°F | Resp 16 | Ht 70.0 in | Wt 189.8 lb

## 2020-11-08 DIAGNOSIS — J449 Chronic obstructive pulmonary disease, unspecified: Secondary | ICD-10-CM

## 2020-11-10 ENCOUNTER — Encounter: Payer: Self-pay | Admitting: Internal Medicine

## 2020-11-10 NOTE — Progress Notes (Signed)
   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

## 2020-11-19 ENCOUNTER — Ambulatory Visit: Payer: Medicare Other | Admitting: Internal Medicine

## 2020-12-06 DIAGNOSIS — E119 Type 2 diabetes mellitus without complications: Secondary | ICD-10-CM | POA: Diagnosis not present

## 2020-12-06 DIAGNOSIS — H40013 Open angle with borderline findings, low risk, bilateral: Secondary | ICD-10-CM | POA: Diagnosis not present

## 2020-12-06 DIAGNOSIS — D3131 Benign neoplasm of right choroid: Secondary | ICD-10-CM | POA: Diagnosis not present

## 2020-12-06 DIAGNOSIS — H35362 Drusen (degenerative) of macula, left eye: Secondary | ICD-10-CM | POA: Diagnosis not present

## 2020-12-06 DIAGNOSIS — H524 Presbyopia: Secondary | ICD-10-CM | POA: Diagnosis not present

## 2020-12-06 LAB — HM DIABETES EYE EXAM

## 2020-12-12 ENCOUNTER — Encounter: Payer: Self-pay | Admitting: *Deleted

## 2020-12-27 ENCOUNTER — Other Ambulatory Visit: Payer: Self-pay | Admitting: Cardiology

## 2020-12-27 DIAGNOSIS — M5416 Radiculopathy, lumbar region: Secondary | ICD-10-CM | POA: Diagnosis not present

## 2021-01-07 ENCOUNTER — Encounter: Payer: Medicare Other | Admitting: Internal Medicine

## 2021-01-22 DIAGNOSIS — R29898 Other symptoms and signs involving the musculoskeletal system: Secondary | ICD-10-CM | POA: Diagnosis not present

## 2021-01-22 DIAGNOSIS — M48062 Spinal stenosis, lumbar region with neurogenic claudication: Secondary | ICD-10-CM | POA: Diagnosis not present

## 2021-01-22 DIAGNOSIS — M5416 Radiculopathy, lumbar region: Secondary | ICD-10-CM | POA: Diagnosis not present

## 2021-02-13 ENCOUNTER — Other Ambulatory Visit: Payer: Self-pay | Admitting: Adult Health

## 2021-02-19 DIAGNOSIS — R29898 Other symptoms and signs involving the musculoskeletal system: Secondary | ICD-10-CM | POA: Diagnosis not present

## 2021-02-21 DIAGNOSIS — N400 Enlarged prostate without lower urinary tract symptoms: Secondary | ICD-10-CM | POA: Diagnosis not present

## 2021-02-21 DIAGNOSIS — L209 Atopic dermatitis, unspecified: Secondary | ICD-10-CM | POA: Diagnosis not present

## 2021-02-26 DIAGNOSIS — M48062 Spinal stenosis, lumbar region with neurogenic claudication: Secondary | ICD-10-CM | POA: Diagnosis not present

## 2021-02-26 DIAGNOSIS — R29898 Other symptoms and signs involving the musculoskeletal system: Secondary | ICD-10-CM | POA: Diagnosis not present

## 2021-02-28 ENCOUNTER — Encounter: Payer: Self-pay | Admitting: Internal Medicine

## 2021-02-28 ENCOUNTER — Ambulatory Visit (INDEPENDENT_AMBULATORY_CARE_PROVIDER_SITE_OTHER): Payer: Medicare Other | Admitting: Internal Medicine

## 2021-02-28 ENCOUNTER — Other Ambulatory Visit: Payer: Self-pay

## 2021-02-28 VITALS — BP 110/70 | HR 76 | Temp 98.6°F | Resp 16 | Ht 70.0 in | Wt 189.8 lb

## 2021-02-28 DIAGNOSIS — Z136 Encounter for screening for cardiovascular disorders: Secondary | ICD-10-CM | POA: Diagnosis not present

## 2021-02-28 DIAGNOSIS — Z1211 Encounter for screening for malignant neoplasm of colon: Secondary | ICD-10-CM

## 2021-02-28 DIAGNOSIS — I10 Essential (primary) hypertension: Secondary | ICD-10-CM

## 2021-02-28 DIAGNOSIS — E1122 Type 2 diabetes mellitus with diabetic chronic kidney disease: Secondary | ICD-10-CM | POA: Diagnosis not present

## 2021-02-28 DIAGNOSIS — Z8249 Family history of ischemic heart disease and other diseases of the circulatory system: Secondary | ICD-10-CM

## 2021-02-28 DIAGNOSIS — E11319 Type 2 diabetes mellitus with unspecified diabetic retinopathy without macular edema: Secondary | ICD-10-CM

## 2021-02-28 DIAGNOSIS — E559 Vitamin D deficiency, unspecified: Secondary | ICD-10-CM

## 2021-02-28 DIAGNOSIS — D692 Other nonthrombocytopenic purpura: Secondary | ICD-10-CM

## 2021-02-28 DIAGNOSIS — J470 Bronchiectasis with acute lower respiratory infection: Secondary | ICD-10-CM

## 2021-02-28 DIAGNOSIS — Z79899 Other long term (current) drug therapy: Secondary | ICD-10-CM

## 2021-02-28 DIAGNOSIS — F17200 Nicotine dependence, unspecified, uncomplicated: Secondary | ICD-10-CM

## 2021-02-28 DIAGNOSIS — N138 Other obstructive and reflux uropathy: Secondary | ICD-10-CM | POA: Diagnosis not present

## 2021-02-28 DIAGNOSIS — N401 Enlarged prostate with lower urinary tract symptoms: Secondary | ICD-10-CM

## 2021-02-28 DIAGNOSIS — I700 Atherosclerosis of aorta: Secondary | ICD-10-CM

## 2021-02-28 DIAGNOSIS — E1169 Type 2 diabetes mellitus with other specified complication: Secondary | ICD-10-CM | POA: Diagnosis not present

## 2021-02-28 DIAGNOSIS — E785 Hyperlipidemia, unspecified: Secondary | ICD-10-CM | POA: Diagnosis not present

## 2021-02-28 DIAGNOSIS — Z125 Encounter for screening for malignant neoplasm of prostate: Secondary | ICD-10-CM

## 2021-02-28 DIAGNOSIS — I2510 Atherosclerotic heart disease of native coronary artery without angina pectoris: Secondary | ICD-10-CM | POA: Diagnosis not present

## 2021-02-28 DIAGNOSIS — Z9861 Coronary angioplasty status: Secondary | ICD-10-CM | POA: Diagnosis not present

## 2021-02-28 MED ORDER — BUDESONIDE-FORMOTEROL FUMARATE 160-4.5 MCG/ACT IN AERO: INHALATION_SPRAY | RESPIRATORY_TRACT | 3 refills | Status: DC

## 2021-02-28 NOTE — Progress Notes (Signed)
Annual  Screening/Preventative Visit  & Comprehensive Evaluation & Examination  Future Appointments  Date Time Provider [place]  02/28/2021  3:00 PM [name], MD [facility] None  10/18/2021 11:00 AM Magda Bernheim, NP [facility] None  03/03/2022  3:00 PM [name], MD [facility] None            This very nice 85 y.o. MWM presents for a Screening /Preventative Visit & comprehensive evaluation and management of multiple medical co-morbidities.  Patient has been followed for HTN, HLD, T2_NIDDM and Vitamin D Deficiency.       HTN predates since 2000. Patient's BP has been controlled at home.  Today's BP is at goal - 110/70. Patient denies any cardiac symptoms as chest pain, palpitations, shortness of breath, dizziness or ankle swelling.       Patient's hyperlipidemia is controlled with diet and medications. Patient denies myalgias or other medication SE's. Last lipids were   Lab Results  Component Value Date   CHOL 134 10/18/2020   HDL 55 10/18/2020   LDLCALC 64 10/18/2020   TRIG 70 10/18/2020   CHOLHDL 2.4 10/18/2020         Patient has  T2_NIDDM (2006) w/CKD2 and patient denies reactive hypoglycemic symptoms, visual blurring, diabetic polys or paresthesias. Last A1c  on Metformin was near goal:   Lab Results  Component Value Date   HGBA1C 5.7 (H) 10/18/2020          Finally, patient has history of Vitamin D Deficiency ("22" /2008) and last vitamin D was at goal:   Lab Results  Component Value Date   VD25OH 71 07/17/2020    Current Outpatient Medications on File Prior to Visit  Medication Sig   aspirin EC 81 MG tablet Take 1 tablet  daily.   SYMBICORT 160-4.5  inhaler Use  2 Inhalations  2 x / day   CINNAMON 1,000 mg  Take  daily.    VITAMIN B-12 SL Place 1 tablet under the tongue daily.   doxazosin  4 MG tablet TAKE 1 TABLET AT BEDTIME    ezetimibe  10 MG tablet TAKE 1 TABLET EVERY DAY   finasteride  5 MG tablet Take  daily.   lisinopril  5  MG tablet TAKE 1 TABLET  EVERY DAY   Magnesium 500 MG CAPS Take 1 capsule daily.   metFORMIN -XR 500 MG 24 hr tablet TAKE 2 TABLETS 2 TIMES A DAY   CORTISPORIN 3.5-10000-1 OTIC susp Use 6-8 drops to affected ear 3 to 4 x /day)   NEXLETOL 180 MG TABS TAKE 1 TABLET EVERY DAY   Spacer  inhaler Use spacer with Inhaler   VITAMIN D PO Takes 10,000 units three times a week    Allergies  Allergen Reactions   Atorvastatin Other (See Comments)    MYALGIAS CRAMPING   Crestor [Rosuvastatin]     MYALGIAS CRAMPING   Statins     MYALGIAS CRAMPING   Tetanus Toxoids     Past Medical History:  Diagnosis Date   Arthritis    BPH (benign prostatic hyperplasia)    CAD S/P percutaneous coronary angioplasty 12/2009; 08/2010   a) 6/'22 - NSTEMI: PCI to LAD: Promus Element 2.5 mm x 15 mm DES; b) Cath for Angina: Prox LAD 60-70% pre-stent with FFR 0.82, 90% RVM -- Med Rx, EF 50-55%   Essential hypertension    Hyperlipidemia with target LDL less than 70    Statin intolerance   Non-ST elevation MI (NSTEMI) ([place])  June 2011   PCI to LAD   Pneumonia    DEC 2016  [place] WITH ANTIBIOTIC   Type 2 diabetes mellitus without complications [facility])      Health Maintenance  Topic Date Due   Zoster Vaccines- Shingrix (1 of 2) Never done   TETANUS/TDAP  07/21/2010   COVID-19 Vaccine (3 - Pfizer risk series) 10/17/2019   FOOT EXAM  01/02/2021   INFLUENZA VACCINE  02/18/2021   HEMOGLOBIN A1C  04/19/2021   OPHTHALMOLOGY EXAM  12/06/2021   PNA vac Low Risk Adult  Completed   HPV VACCINES  Aged Out    Immunization History  Administered Date(s) Administered   Influenza, High Dose  04/10/2017, 05/13/2018, 05/10/2019   Influenza 05/13/2011   PFIZER   SARS-COV-2 Vacc 08/17/2019, 09/19/2019   Pneumococcal -13 04/18/2014   Pneumococcal -23 07/21/2002, 05/23/2015   Td 07/21/2000   Last Colon - 06/23/2017 - Dr Oletta Lamas - recc no F/U due to age  Past Surgical History:  Procedure Laterality Date   CARDIAC  CATHETERIZATION  12/2009   Proximal LAD stenosis followed by a significant 80-90% distal stenosis   CARDIAC CATHETERIZATION  February 2012    90% ostial RV marginal branch; 60-70% proximal LAD with widely patent distal stent. FFR 0.82; medical therapy   CATARACT EXTRACTION W/ INTRAOCULAR LENS  IMPLANT, BILATERAL     CORONARY ANGIOPLASTY  12/2009   PTCA to proximal LAD; PCI with Promus Element DES stent  2.5 mm x 15 mm  distalLAD - .for non-ST elevation MI   DENTAL SURGERY     LUMBAR LAMINECTOMY/DECOMPRESSION MICRODISCECTOMY N/A 08/09/2015   Procedure: Lumbar Four-Five, Lumbar Five- Sacral One Decompressive Lumbar Laminectomy with Resection of Synovial Cyst;  Surgeon: Erline Levine, MD;  Location: [facility];  Service: Neurosurgery;  Laterality: N/A;  L4 to S1 Decompressive Lumbar Laminectomy   LUMBAR LAMINECTOMY/DECOMPRESSION MICRODISCECTOMY Right 01/21/2016   Procedure: Redo Right Lumbar Five-Sacral One Laminectomy for synovial cyst;  Surgeon: Erline Levine, MD;  Location: [facility];  Service: Neurosurgery;  Laterality: Right;  right   LUMBAR LAMINECTOMY/DECOMPRESSION MICRODISCECTOMY Right 10/08/2017   Procedure: Right Lumbar three-four Laminectomy for synovial cyst;  Surgeon: Erline Levine, MD;  Location: [facility];  Service: Neurosurgery;  Laterality: Right;   NASAL FRACTURE SURGERY     TRANSTHORACIC ECHOCARDIOGRAM  June 2011   - (EF not reported) Moderately dilated LV; moderate hypokinesis of anteroseptal and anterior wall consistent with MI. Grade 1 diastolic dysfunction. Mild to moderately dilated LA   VASECTOMY       Family History  Problem Relation Age of Onset   Cancer Father        lung   Stroke Father    Cancer Sister        breast   Cancer Brother        lung   Cancer Son        history of prostate    Social History   Socioeconomic History   Marital status: Married    Spouse name: Kermit Balo   Number of children: 1 son & 1 daughter  Occupational History   Not on file   Tobacco Use   Smoking status: Every Day    Types: Pipe   Smokeless tobacco: Never   Tobacco comments:    smokes pipe qd  Vaping Use   Vaping Use: Never used  Substance and Sexual Activity   Alcohol use: Yes    Alcohol/week: 7.0 standard drinks    Types: 7 Standard drinks  or equivalent per week    Comment: drinks 4 ounces of vodka daily   Drug use: No   Sexual activity: Not on file    ROS Constitutional: Denies fever, chills, weight loss/gain, headaches, insomnia,  night sweats or change in appetite. Does c/o fatigue. Eyes: Denies redness, blurred vision, diplopia, discharge, itchy or watery eyes.  ENT: Denies discharge, congestion, post nasal drip, epistaxis, sore throat, earache, hearing loss, dental pain, Tinnitus, Vertigo, Sinus pain or snoring.  Cardio: Denies chest pain, palpitations, irregular heartbeat, syncope, dyspnea, diaphoresis, orthopnea, PND, claudication or edema Respiratory: denies cough, dyspnea, DOE, pleurisy, hoarseness, laryngitis or wheezing.  Gastrointestinal: Denies dysphagia, heartburn, reflux, water brash, pain, cramps, nausea, vomiting, bloating, diarrhea, constipation, hematemesis, melena, hematochezia, jaundice or hemorrhoids Genitourinary: Denies dysuria, frequency, urgency, nocturia, hesitancy, discharge, hematuria or flank pain Musculoskeletal: Denies arthralgia, myalgia, stiffness, Jt. Swelling, pain, limp or strain/sprain. Denies Falls. Skin: Denies puritis, rash, hives, warts, acne, eczema or change in skin lesion Neuro: No weakness, tremor, incoordination, spasms, paresthesia or pain Psychiatric: Denies confusion, memory loss or sensory loss. Denies Depression. Endocrine: Denies change in weight, skin, hair change, nocturia, and paresthesia, diabetic polys, visual blurring or hyper / hypo glycemic episodes.  Heme/Lymph: No excessive bleeding, bruising or enlarged lymph nodes.   Physical Exam  BP 110/70   Pulse 76   Temp 98.6 F (37 C)   Resp  16   Ht '5\' 10"'$  (1.778 m)   Wt 189 lb 12.8 oz (86.1 kg)   SpO2 96%   BMI 27.23 kg/m   General Appearance: Well nourished and well groomed and in no apparent distress.  Eyes: PERRLA, EOMs, conjunctiva no swelling or erythema. Fundi not well visualized. Sinuses: No frontal/maxillary tenderness ENT/Mouth: EACs patent / TMs  nl. Nares clear without erythema, swelling, mucoid exudates. Oral hygiene is good. No erythema, swelling, or exudate. Tongue normal, non-obstructing. Tonsils not swollen or erythematous. Hearing normal.  Neck: Supple, thyroid not palpable. No bruits, nodes or JVD. Respiratory: Respiratory effort normal.  BS equal and clear bilateral without rales, rhonci, wheezing or stridor. Cardio: Heart sounds are normal with regular rate and rhythm and no murmurs, rubs or gallops. Peripheral pulses are normal and equal bilaterally without edema. No aortic or femoral bruits. Chest: symmetric with normal excursions and percussion.  Abdomen: Soft, with Nl bowel sounds. Nontender, no guarding, rebound, hernias, masses, or organomegaly.  Lymphatics: Non tender without lymphadenopathy.  Musculoskeletal: Full ROM all peripheral extremities, joint stability, 5/5 strength, and normal gait. Skin: Warm and dry without rashes, lesions, cyanosis, clubbing or  ecchymosis.  Neuro: Cranial nerves intact, reflexes equal bilaterally. Normal muscle tone, no cerebellar symptoms. Sensation intact.  Pysch: Alert and oriented X 3 with normal affect, insight and judgment appropriate.   Assessment and Plan   1. Essential hypertension  - EKG 12-Lead - [place], RETROPERITNL ABD,  LTD - Urinalysis, Routine w reflex microscopic - Microalbumin / creatinine urine ratio - CBC with Differential/Platelet - COMPLETE METABOLIC PANEL WITH GFR - Magnesium - TSH  2. Hyperlipidemia associated with type 2 diabetes mellitus ([place])  - EKG 12-Lead - [place], RETROPERITNL ABD,  LTD - Lipid panel  3. Type 2 diabetes mellitus  with stage 2 chronic kidney  disease, without long-term current use of insulin (HCC)  - EKG 12-Lead - [place], RETROPERITNL ABD,  LTD - HM DIABETES FOOT EXAM - LOW EXTREMITY NEUR EXAM DOCUM - Hemoglobin A1c - Insulin, random  4. Vitamin D deficiency  - VITAMIN D 25 Hydroxy   5.  CAD S/P percutaneous coronary angioplasty and  PCI- DES mid LAD in the setting of non-STEMI  - EKG 12-Lead - Lipid panel  6. Aortic atherosclerosis ([place]) by CT scan 2017  - EKG 12-Lead - [place], RETROPERITNL ABD,  LTD - Lipid panel  7. Diabetic retinopathy of both eyes associated with type 2 diabetes mellitus,(HCC)   8. BPH with obstruction/lower urinary tract symptoms  - PSA - [facility] DIABETES FOOT EXAM  9. Screening for colorectal cancer  - POC Hemoccult Bld/Stl   10. Senile purpura (HCC)  - CBC with Differential/Platelet  11. Screening for ischemic heart disease  - EKG 12-Lead  12. Family history of cerebrovascular event  - EKG 12-Lead - [place], RETROPERITNL ABD,  LTD  13. Smoker  - EKG 12-Lead - [place], RETROPERITNL ABD,  LTD  14. Screening for AAA (aortic abdominal aneurysm)  - [place], RETROPERITNL ABD,  LTD  15. Medication management  - Urinalysis, Routine w reflex microscopic - Microalbumin / creatinine urine ratio - CBC with Differential/Platelet - COMPLETE METABOLIC PANEL WITH GFR - Magnesium - Lipid panel - TSH - Hemoglobin A1c - Insulin, random  16. Prostate cancer screening  - PSA       Patient was counseled in prudent diet, weight control to achieve/maintain BMI less than 25, BP monitoring, regular exercise and medications as discussed.  Discussed med effects and SE's. Routine screening labs and tests as requested with regular follow-up as recommended. Over 40 minutes of exam, counseling, chart review and high complex critical decision making was performed   Kirtland Bouchard, MD

## 2021-02-28 NOTE — Patient Instructions (Signed)

## 2021-03-01 LAB — CBC WITH DIFFERENTIAL/PLATELET
Absolute Monocytes: 810 cells/uL (ref 200–950)
Basophils Absolute: 62 cells/uL (ref 0–200)
Basophils Relative: 0.7 %
Eosinophils Absolute: 396 cells/uL (ref 15–500)
Eosinophils Relative: 4.5 %
HCT: 44.7 % (ref 38.5–50.0)
Hemoglobin: 14.8 g/dL (ref 13.2–17.1)
Lymphs Abs: 1505 cells/uL (ref 850–3900)
MCH: 31.4 pg (ref 27.0–33.0)
MCHC: 33.1 g/dL (ref 32.0–36.0)
MCV: 94.9 fL (ref 80.0–100.0)
MPV: 11 fL (ref 7.5–12.5)
Monocytes Relative: 9.2 %
Neutro Abs: 6028 cells/uL (ref 1500–7800)
Neutrophils Relative %: 68.5 %
Platelets: 269 10*3/uL (ref 140–400)
RBC: 4.71 10*6/uL (ref 4.20–5.80)
RDW: 12.6 % (ref 11.0–15.0)
Total Lymphocyte: 17.1 %
WBC: 8.8 10*3/uL (ref 3.8–10.8)

## 2021-03-01 LAB — LIPID PANEL
Cholesterol: 131 mg/dL (ref <200)
HDL: 54 mg/dL (ref >=40)
LDL Cholesterol (Calc): 56 mg/dL (calc)
Non-HDL Cholesterol (Calc): 77 mg/dL (calc) (ref <130)
Total CHOL/HDL Ratio: 2.4 (calc) (ref <5.0)
Triglycerides: 130 mg/dL (ref <150)

## 2021-03-01 LAB — URINALYSIS, ROUTINE W REFLEX MICROSCOPIC
Bilirubin Urine: NEGATIVE
Glucose, UA: NEGATIVE
Hgb urine dipstick: NEGATIVE
Ketones, ur: NEGATIVE
Leukocytes,Ua: NEGATIVE
Nitrite: NEGATIVE
Protein, ur: NEGATIVE
Specific Gravity, Urine: 1.022 (ref 1.001–1.035)
pH: 6.5 (ref 5.0–8.0)

## 2021-03-01 LAB — COMPLETE METABOLIC PANEL WITH GFR
AG Ratio: 2 (calc) (ref 1.0–2.5)
ALT: 10 U/L (ref 9–46)
AST: 15 U/L (ref 10–35)
Albumin: 4.3 g/dL (ref 3.6–5.1)
Alkaline phosphatase (APISO): 40 U/L (ref 35–144)
BUN: 18 mg/dL (ref 7–25)
CO2: 29 mmol/L (ref 20–32)
Calcium: 9.8 mg/dL (ref 8.6–10.3)
Chloride: 103 mmol/L (ref 98–110)
Creat: 0.98 mg/dL (ref 0.70–1.22)
Globulin: 2.2 g/dL (calc) (ref 1.9–3.7)
Glucose, Bld: 122 mg/dL — ABNORMAL HIGH (ref 65–99)
Potassium: 4.3 mmol/L (ref 3.5–5.3)
Sodium: 139 mmol/L (ref 135–146)
Total Bilirubin: 0.5 mg/dL (ref 0.2–1.2)
Total Protein: 6.5 g/dL (ref 6.1–8.1)
eGFR: 77 mL/min/{1.73_m2} (ref >=60)

## 2021-03-01 LAB — VITAMIN D 25 HYDROXY (VIT D DEFICIENCY, FRACTURES): Vit D, 25-Hydroxy: 71 ng/mL (ref 30–100)

## 2021-03-01 LAB — TSH: TSH: 1.71 mIU/L (ref 0.40–4.50)

## 2021-03-01 LAB — INSULIN, RANDOM: Insulin: 32 u[IU]/mL — ABNORMAL HIGH

## 2021-03-01 LAB — MAGNESIUM: Magnesium: 2.1 mg/dL (ref 1.5–2.5)

## 2021-03-01 LAB — MICROALBUMIN / CREATININE URINE RATIO
Creatinine, Urine: 145 mg/dL (ref 20–320)
Microalb Creat Ratio: 5 mcg/mg creat (ref <30)
Microalb, Ur: 0.7 mg/dL

## 2021-03-01 LAB — PSA: PSA: 0.82 ng/mL (ref <=4.00)

## 2021-03-01 LAB — HEMOGLOBIN A1C
Hgb A1c MFr Bld: 5.6 % of total Hgb (ref <5.7)
Mean Plasma Glucose: 114 mg/dL
eAG (mmol/L): 6.3 mmol/L

## 2021-03-02 NOTE — Progress Notes (Signed)
============================================================ -   Test results slightly outside the reference range are not unusual. If there is anything important, I will review this with you,  otherwise it is considered normal test values.  If you have further questions,  please do not hesitate to contact me at the office or via My Chart.  ============================================================ ============================================================  -  Vitamin D = 71 - Excellent  ============================================================ ============================================================  -  PSA - Very low - Great ! ============================================================ ============================================================  -  Total Chol = 131    & LDL Chol = 56   -  Both   -  Excellent   - Very low risk for Heart Attack  / Stroke ============================================================ ============================================================  -  A1c = 5.6% - Great - back to Normal nonDiabetic range ! ============================================================ ============================================================  -  All Else - CBC - Kidneys - Electrolytes - Liver - Magnesium & Thyroid    - all  Normal / OK ============================================================ ============================================================  -  Keep up the [place] Work  !  ============================================================ ============================================================

## 2021-03-05 ENCOUNTER — Other Ambulatory Visit: Payer: Self-pay | Admitting: Internal Medicine

## 2021-04-01 ENCOUNTER — Other Ambulatory Visit: Payer: Self-pay

## 2021-04-01 DIAGNOSIS — M5416 Radiculopathy, lumbar region: Secondary | ICD-10-CM | POA: Diagnosis not present

## 2021-04-01 DIAGNOSIS — Z1211 Encounter for screening for malignant neoplasm of colon: Secondary | ICD-10-CM

## 2021-04-01 LAB — POC HEMOCCULT BLD/STL (HOME/3-CARD/SCREEN)
Card #2 Fecal Occult Blod, POC: NEGATIVE
Card #3 Fecal Occult Blood, POC: NEGATIVE
Fecal Occult Blood, POC: NEGATIVE

## 2021-04-02 DIAGNOSIS — Z1211 Encounter for screening for malignant neoplasm of colon: Secondary | ICD-10-CM | POA: Diagnosis not present

## 2021-04-02 DIAGNOSIS — Z1212 Encounter for screening for malignant neoplasm of rectum: Secondary | ICD-10-CM | POA: Diagnosis not present

## 2021-04-03 ENCOUNTER — Other Ambulatory Visit: Payer: Self-pay | Admitting: Adult Health

## 2021-04-26 DIAGNOSIS — Z6827 Body mass index (BMI) 27.0-27.9, adult: Secondary | ICD-10-CM | POA: Diagnosis not present

## 2021-04-26 DIAGNOSIS — M48062 Spinal stenosis, lumbar region with neurogenic claudication: Secondary | ICD-10-CM | POA: Diagnosis not present

## 2021-04-26 DIAGNOSIS — M4316 Spondylolisthesis, lumbar region: Secondary | ICD-10-CM | POA: Diagnosis not present

## 2021-04-26 DIAGNOSIS — R030 Elevated blood-pressure reading, without diagnosis of hypertension: Secondary | ICD-10-CM | POA: Diagnosis not present

## 2021-06-06 ENCOUNTER — Other Ambulatory Visit: Payer: Self-pay | Admitting: Internal Medicine

## 2021-06-06 DIAGNOSIS — J470 Bronchiectasis with acute lower respiratory infection: Secondary | ICD-10-CM

## 2021-06-06 MED ORDER — BUDESONIDE-FORMOTEROL FUMARATE 160-4.5 MCG/ACT IN AERO: INHALATION_SPRAY | RESPIRATORY_TRACT | 3 refills | Status: DC

## 2021-06-11 DIAGNOSIS — F322 Major depressive disorder, single episode, severe without psychotic features: Secondary | ICD-10-CM | POA: Diagnosis not present

## 2021-06-11 DIAGNOSIS — F0390 Unspecified dementia without behavioral disturbance: Secondary | ICD-10-CM | POA: Diagnosis not present

## 2021-06-12 IMAGING — CR DG CHEST 2V
2 series · 2 of 2 positions shown · non-contrast
Comparison: 01/08/2017 chest radiograph.

CLINICAL DATA: Productive cough for 5 years.  Current smoker.

EXAM:
CHEST - 2 VIEW

[w chest pa]
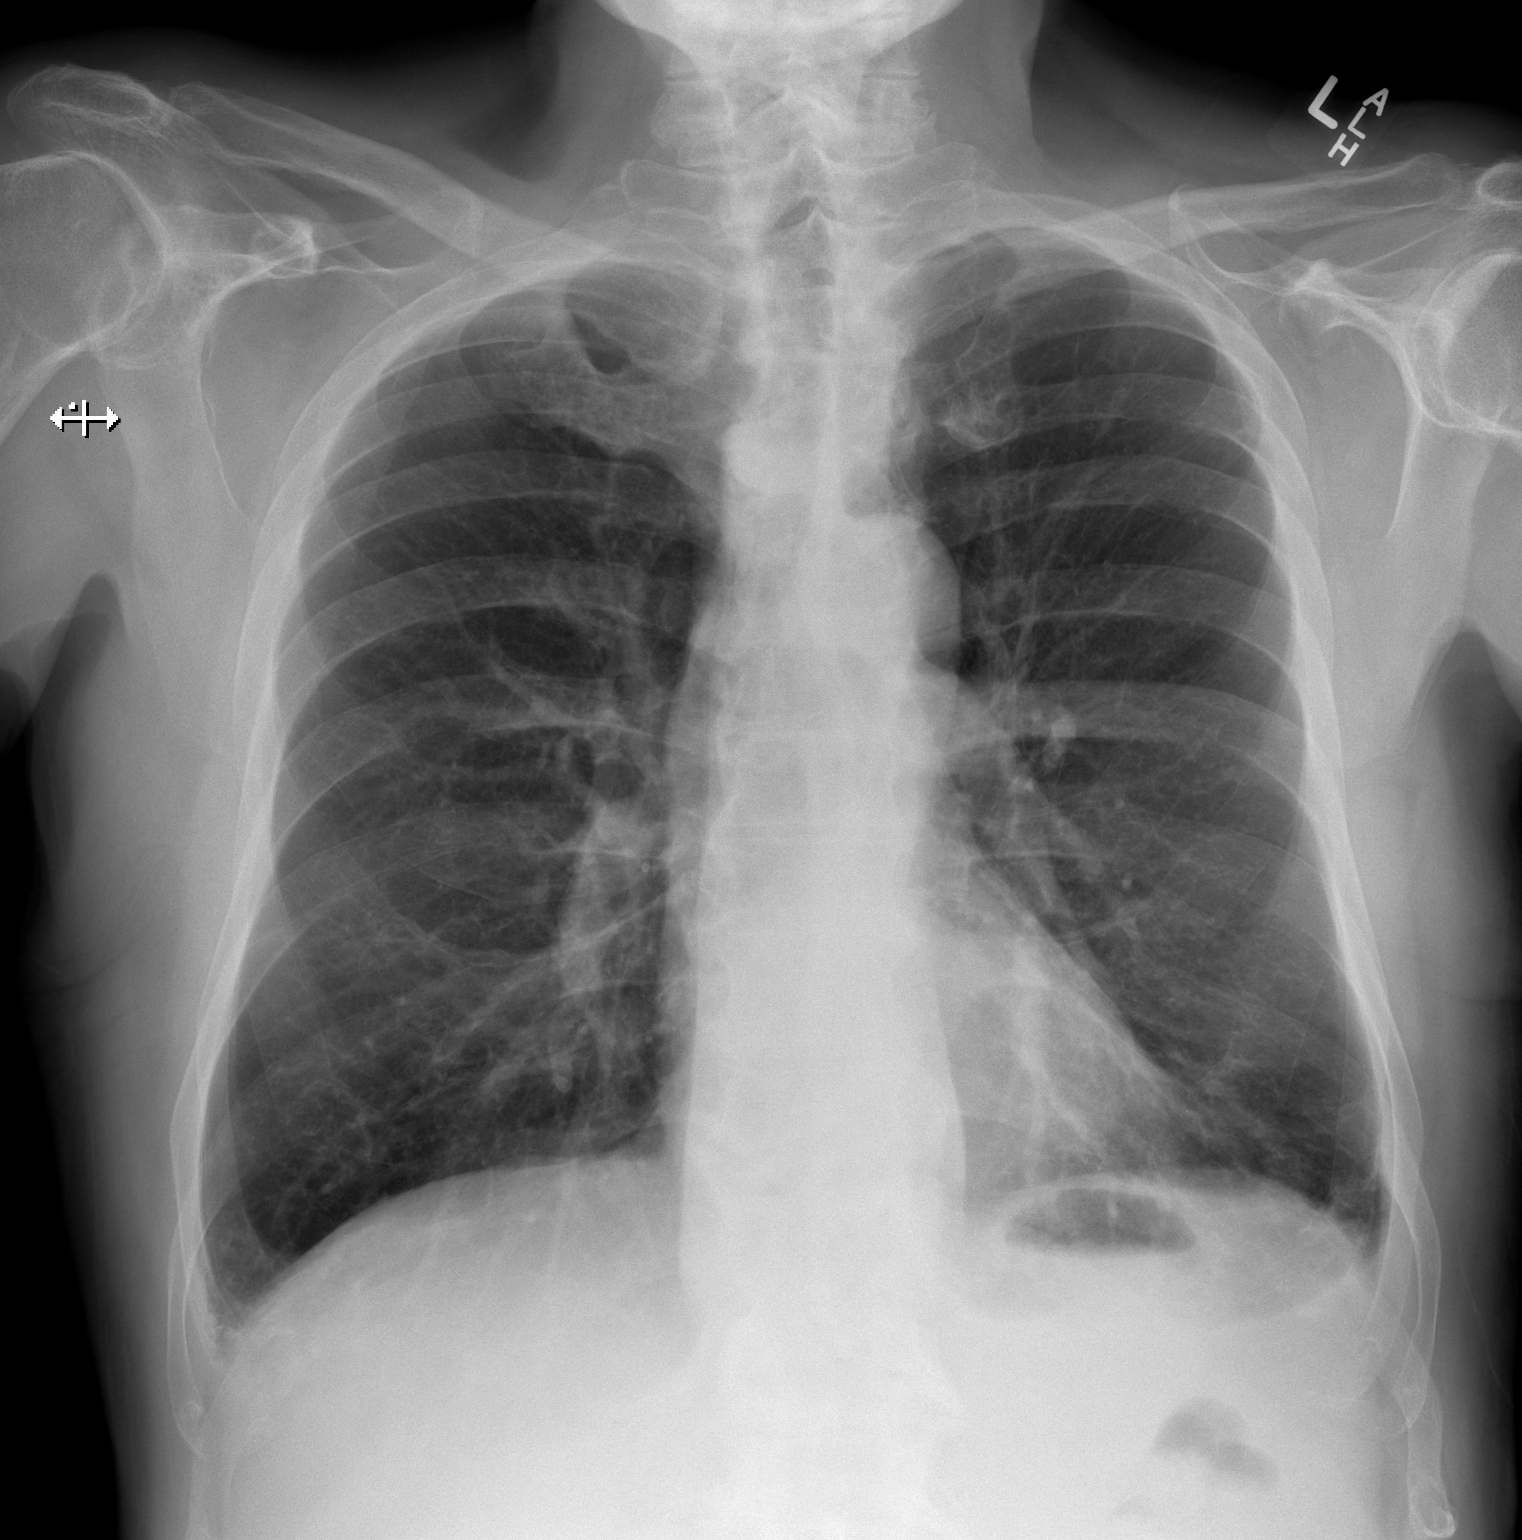

[w chest lat]
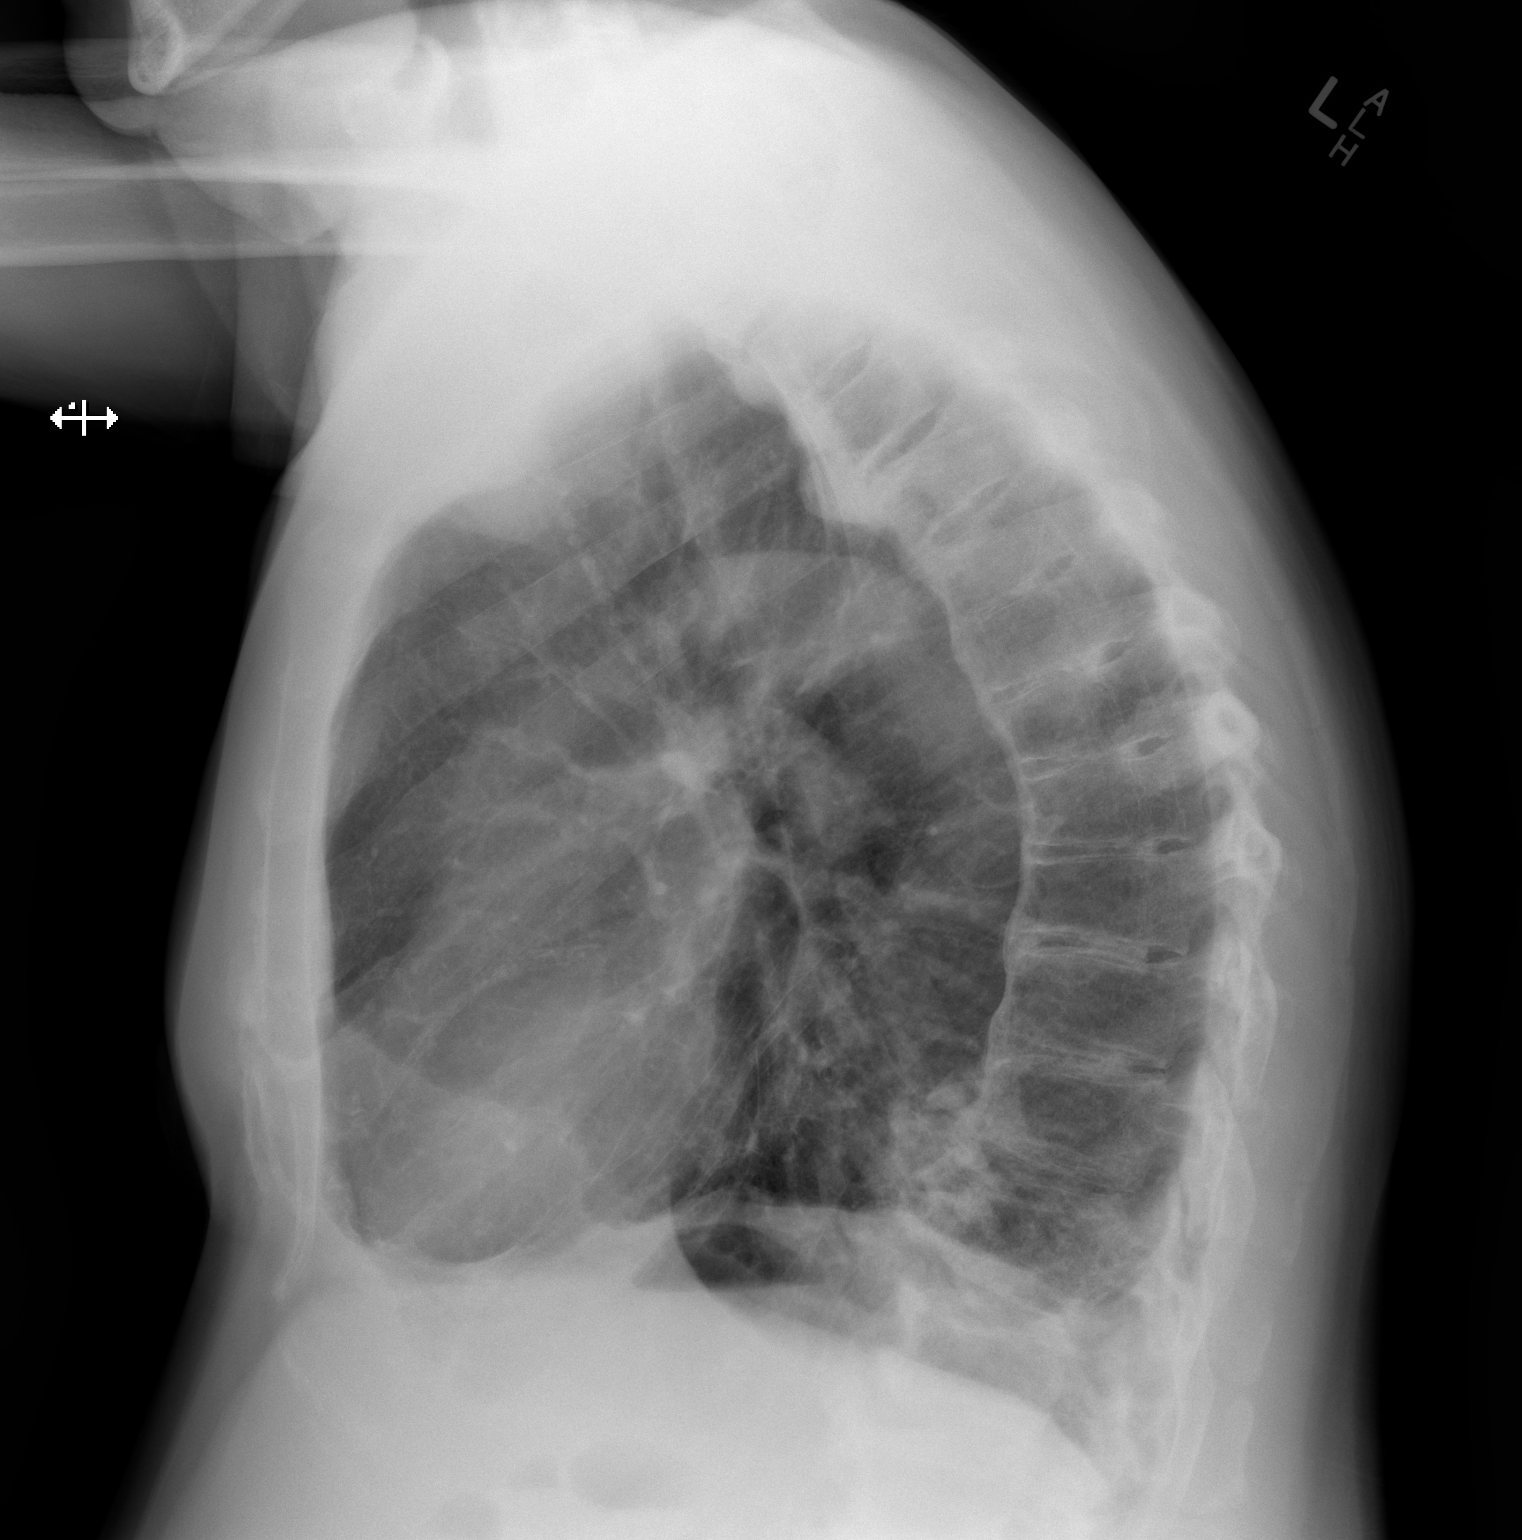

[2 of 2 positions shown; findings below may reference images not displayed]

FINDINGS: Stable cardiomediastinal silhouette with normal heart size. No
pneumothorax. Hyperinflated lungs. No pleural effusions. No
pulmonary edema. Indistinct patchy density overlying the lower
thoracic spine on the lateral view.
IMPRESSION: 1. Hyperinflated lungs, suggesting COPD.
2. Indistinct patchy density overlying the lower thoracic spine on
the lateral view. Suggest noncontrast chest CT for further
evaluation to exclude a pulmonary nodule.

These results will be called to the ordering clinician or
representative by the Radiologist Assistant, and communication
documented in the PACS or [REDACTED].

## 2021-06-25 DIAGNOSIS — F0390 Unspecified dementia without behavioral disturbance: Secondary | ICD-10-CM | POA: Insufficient documentation

## 2021-06-25 DIAGNOSIS — F322 Major depressive disorder, single episode, severe without psychotic features: Secondary | ICD-10-CM | POA: Insufficient documentation

## 2021-06-26 ENCOUNTER — Telehealth: Payer: Self-pay | Admitting: Internal Medicine

## 2021-06-26 NOTE — Chronic Care Management (AMB) (Signed)
  Chronic Care Management   Note  06/26/2021 Name: David [name] MRN: [id number] DOB: 03/12/1936  David [name] is a 85 y.o. year old male who is a primary care patient of David Pinto, MD. I reached out to David [name] by phone today in response to a referral sent by David [name] PCP, David Pinto, MD.   David [name] was given information about Chronic Care Management services today including:  CCM service includes personalized support from designated clinical staff supervised by his physician, including individualized plan of care and coordination with other care providers 24/7 contact phone numbers for assistance for urgent and routine care needs. Service will only be billed when office clinical staff spend 20 minutes or more in a month to coordinate care. Only one practitioner may furnish and bill the service in a calendar month. The patient may stop CCM services at any time (effective at the end of the month) by phone call to the office staff.   Patient agreed to services and verbal consent obtained.   Follow up plan:   Tatjana Secretary/administrator

## 2021-07-01 DIAGNOSIS — M48062 Spinal stenosis, lumbar region with neurogenic claudication: Secondary | ICD-10-CM | POA: Diagnosis not present

## 2021-07-08 ENCOUNTER — Ambulatory Visit (INDEPENDENT_AMBULATORY_CARE_PROVIDER_SITE_OTHER): Payer: Medicare Other | Admitting: Internal Medicine

## 2021-07-08 ENCOUNTER — Other Ambulatory Visit: Payer: Self-pay

## 2021-07-08 ENCOUNTER — Encounter: Payer: Self-pay | Admitting: Internal Medicine

## 2021-07-08 VITALS — BP 129/74 | HR 64 | Temp 97.9°F | Resp 16 | Ht 70.0 in | Wt 191.0 lb

## 2021-07-08 DIAGNOSIS — E538 Deficiency of other specified B group vitamins: Secondary | ICD-10-CM

## 2021-07-08 DIAGNOSIS — F01518 Vascular dementia, unspecified severity, with other behavioral disturbance: Secondary | ICD-10-CM

## 2021-07-08 DIAGNOSIS — F17200 Nicotine dependence, unspecified, uncomplicated: Secondary | ICD-10-CM | POA: Diagnosis not present

## 2021-07-08 DIAGNOSIS — Z79899 Other long term (current) drug therapy: Secondary | ICD-10-CM

## 2021-07-08 DIAGNOSIS — Z9861 Coronary angioplasty status: Secondary | ICD-10-CM | POA: Diagnosis not present

## 2021-07-08 DIAGNOSIS — I2510 Atherosclerotic heart disease of native coronary artery without angina pectoris: Secondary | ICD-10-CM | POA: Diagnosis not present

## 2021-07-08 DIAGNOSIS — I10 Essential (primary) hypertension: Secondary | ICD-10-CM

## 2021-07-08 NOTE — Progress Notes (Signed)
Future Appointments  Date Time Provider Department  07/08/2021  3:30 PM [name], MD [facility]  08/06/2021  3:00 PM Newton Pigg, [facility]  10/18/2021 11:00 AM Magda Bernheim, NP [facility]  03/03/2022  3:00 PM [name], MD [facility]    History of Present Illness:      Patient is a very nice 85 yo MWM with progressive Dementia over 5 years & worse over the last 6 months ( per wife ) who recently had a neuropsychologist cognitive evaluation by Dr Norton Pastel at [place] . Cognitive testing found low performance. Patient was noted to have a low Vit B12 = 275 in 2021 & patient was recommended  to supplement Vit B12 SL   ( He's taking regular oral tabs ! ).  Thyroid studies have been normal .  Patient has also been followed for HTN, HLD, T2_NIDDM and Vitamin D Deficiency.   Medications    metFORMIN -XR 500 MG , TAKE 2 TABLETS 2 TIMES A DAY WITH MEALS    doxazosin 4 MG tablet, TAKE 1 TABLET AT BEDTIME FOR BP & PROSTATE   ezetimibe 10 MG tablet, TAKE 1 TABLET  EVERY DAY   lisinopril 5 MG tablet, TAKE 1 TABLET EVERY DAY   NEXLETOL 180 MG TABS, TAKE 1 TABLET  EVERY DAY  SYMBICORT 160-4.5  inhaler, Use  2 Inhalations (15 minutes apart)  2 x / day   aspirin EC 81 MG , Take 1 tablet daily.   Cyanocobalamin (VITAMIN B-12 ), Place 1 tablet daily.   CINNAMON 1,000 mg , Take daily.    finasteride  5 MG tablet, Takedaily.   Magnesium 500 MG CAPS, Take 1 capsule daily.   CORTISPORIN  OTIC suspension, Use 6-8 drops to affected ear 3 to 4 x /day   Spacer/Aero-Holding Chambers (AEROCHAMBER PLUS) inhaler, Use spacer with Inhaler   VITAMIN D ,  Takes 10,000 units three times a week  Problem list He has CAD S/P percutaneous coronary angioplasty and PCI- DES mid LAD in the setting of non-STEMI; Hyperlipidemia associated with type 2 diabetes mellitus ([place]); Essential hypertension; Type II diabetes mellitus with nephropathy ([place]); Tobacco dependency; Overweight  (BMI 25.0-29.9); Vitamin D deficiency; Medication management; BPH (benign prostatic hyperplasia); Synovial cyst of lumbar facet joint; Aortic atherosclerosis ([place]) by CT scan 2017; Retinopathy, diabetic, bilateral ([place]); Preoperative clearance; Memory changes; B12 deficiency; Myalgia due to statin; and Venous stasis dermatitis of both lower extremities on their problem list.   Observations/Objective:  BP 129/74    Pulse 64    Temp 97.9 F (36.6 C)    Resp 16    Ht 5\' 10"  (1.778 m)    Wt 191 lb (86.6 kg)    SpO2 99%    BMI 27.41 kg/m   HEENT - WNL. Neck - supple.  Chest - Clear equal BS. Cor - Nl HS. RRR w/o sig MGR. PP 1(+). No edema. MS- FROM w/o deformities.  Gait Nl. Neuro -  Nl w/o focal abnormalities.  Poor short term recall. Very conversant, but has expressive aphasia with word finding difficulties.  (+) Palmo-mental reflex & (+) Snout test .   Assessment and Plan:  1. Essential hypertension   2. B12 deficiency  - Vitamin B12 - Methylmalonic acid, serum  3. Vascular dementia with other behavioral disturbance, unspecified dementia severity  - Ambulatory referral to Neurology  4. Smoker   5. Medication management  - Vitamin B12 - Methylmalonic acid, serum   Follow Up  Instructions:       I discussed the assessment and treatment plan with the patient. The patient was provided an opportunity to ask questions and all were answered. The patient  & wife agreed with the plan and demonstrated an understanding of the instructions.   Over 25 minutes of counseling,  chart review and critical decision making was performed      They were advised to call back or seek an in-person evaluation if the symptoms worsen or if the condition fails to improve as anticipated.   Kirtland Bouchard, MD

## 2021-07-09 NOTE — Progress Notes (Signed)
============================================================ °-   Test results slightly outside the reference range are not unusual. If there is anything important, I will review this with you,  otherwise it is considered normal test values.  If you have further questions,  please do not hesitate to contact me at the office or via My Chart.  ============================================================ ============================================================  -  Vitamin B12 Level is back in Normal  "safe " rangr   - Suggest take Vitamin B12  sublingual  ( dissolve under tongue form )  2 x /week  - any dose is OK  either the 1,000 mcg, 2,500 mcg or the 5,000 mcg dose                                                                                     - all get the same results  ! ============================================================ ============================================================

## 2021-07-10 DIAGNOSIS — Z23 Encounter for immunization: Secondary | ICD-10-CM | POA: Diagnosis not present

## 2021-07-11 LAB — VITAMIN B12: Vitamin B-12: 558 pg/mL (ref 200–1100)

## 2021-07-11 LAB — METHYLMALONIC ACID, SERUM: Methylmalonic Acid, Quant: 129 nmol/L (ref 87–318)

## 2021-07-31 DIAGNOSIS — M48062 Spinal stenosis, lumbar region with neurogenic claudication: Secondary | ICD-10-CM | POA: Diagnosis not present

## 2021-08-05 ENCOUNTER — Telehealth: Payer: Self-pay

## 2021-08-05 NOTE — Telephone Encounter (Signed)
Spoke to pts. Wife in regards to pts. Upcoming appointment with the CPP. Confirmed appointment and went over pre-call questions wirth wife Kermit Balo. LM, Hc

## 2021-08-06 ENCOUNTER — Ambulatory Visit: Payer: Medicare Other | Admitting: Pharmacist

## 2021-08-06 ENCOUNTER — Other Ambulatory Visit: Payer: Self-pay

## 2021-08-06 DIAGNOSIS — E1121 Type 2 diabetes mellitus with diabetic nephropathy: Secondary | ICD-10-CM

## 2021-08-06 DIAGNOSIS — R350 Frequency of micturition: Secondary | ICD-10-CM

## 2021-08-06 DIAGNOSIS — M791 Myalgia, unspecified site: Secondary | ICD-10-CM

## 2021-08-06 DIAGNOSIS — E785 Hyperlipidemia, unspecified: Secondary | ICD-10-CM

## 2021-08-06 DIAGNOSIS — I700 Atherosclerosis of aorta: Secondary | ICD-10-CM

## 2021-08-06 DIAGNOSIS — E1169 Type 2 diabetes mellitus with other specified complication: Secondary | ICD-10-CM

## 2021-08-06 DIAGNOSIS — Z79899 Other long term (current) drug therapy: Secondary | ICD-10-CM

## 2021-08-06 DIAGNOSIS — I10 Essential (primary) hypertension: Secondary | ICD-10-CM

## 2021-08-06 DIAGNOSIS — N401 Enlarged prostate with lower urinary tract symptoms: Secondary | ICD-10-CM

## 2021-08-06 DIAGNOSIS — Z9861 Coronary angioplasty status: Secondary | ICD-10-CM

## 2021-08-06 DIAGNOSIS — F17200 Nicotine dependence, unspecified, uncomplicated: Secondary | ICD-10-CM

## 2021-08-06 DIAGNOSIS — I2510 Atherosclerotic heart disease of native coronary artery without angina pectoris: Secondary | ICD-10-CM

## 2021-08-19 DIAGNOSIS — E785 Hyperlipidemia, unspecified: Secondary | ICD-10-CM | POA: Diagnosis not present

## 2021-08-19 DIAGNOSIS — I2510 Atherosclerotic heart disease of native coronary artery without angina pectoris: Secondary | ICD-10-CM | POA: Diagnosis not present

## 2021-08-19 DIAGNOSIS — E1121 Type 2 diabetes mellitus with diabetic nephropathy: Secondary | ICD-10-CM | POA: Diagnosis not present

## 2021-08-19 DIAGNOSIS — I10 Essential (primary) hypertension: Secondary | ICD-10-CM | POA: Diagnosis not present

## 2021-08-19 NOTE — Progress Notes (Signed)
Pharmacist Visit  [name], [name]  [id number] 50 years, Male  DOB: 13-Jul-1936  M: [phone] Care Team: Rhys Martini, Deliana Avalos  __________________________________________________ Clinical Summary Situation:: David [name] is an 86 year old male who presents for CCM initial visit. He has no chief complaints at this time Background:: Patient has a history of HTN, HLD with CAD and aortic atherosclerosis, T2DM, BPH, Tobacco dependency, Bronchiectasis, Vitamin B-12 and D deficiency. Assessment:: Patient is taking all medications as prescribed except for Symbicort. Only using once daily. Recommendations:: Encouraged patient to take Symbicort 160/4.5 2 inhalations BID for maximum benefit. Educated patient on the importance of COVID, Shingrix, and TdaP vaccine. Educated pt on importance of smoking cessation Educated patient on importance of adherence to HLD and HTN medications Lawyer:: CCM Services:  This encounter meets complex CCM services and moderate to high medical decision making.  Prior to outreach and patient consent for Chronic Care Management, I referred this patient for services after reviewing the nominated patient list or from a personal encounter with the patient.  I have personally reviewed this encounter including the documentation in this note and have collaborated with the care management provider regarding care management and care coordination activities to include development and update of the comprehensive care plan I am certifying that I agree with the content of this note and encounter as supervising physician.  Chronic Conditions Patient's Chronic Conditions: Hyperlipidemia/Dyslipidemia (HLD), Diabetes (DM), Benign Prostatic Hyperplasia (BPH), Hypertension (HTN), CAD, Vitamin D deficiency, Diabetic retinopathy, B12 deficiency, memory changes  Doctor and Hospital Visits Were there PCP Visits in last 6 months?: Yes Visit #1: 02/28/21-Dr. Melford Aase  (PCP)-Pt. presented for AWV. No medication changes noted. Visit #2: 07/08/21-Dr. Hoy Finlay, presented for f/u d/t progressive dementia. Pt. was seen for a neuropsychologist cognitive evaluation recently by Dr. Norton Pastel at [facility]. No medication changes noted. Were there Specialist Visits in last 6 months?: Yes Visit #1: 06/11/21-McDermott, Louann Liv, PhD (Psychology)-Pt. presented for experiencing progressively worsening cognitive difficulties that have manifested over the past several years, with a particular decline in the past 6 months. Chief complaint was expressive aphasia that began insidiously and has been worsening over time. Considered treatment with cholinesterase inhibitor.  Consider supplementation with memantine, an NMDA antagonist, after the cholinesterase inhibitor is recorded as well tolerated.  Continued supplementation with B12 appears warranted; defer to PCP.  Was there a Hospital Visit in last 30 days?: No Were there other Hospital Visits in last 6 months?: No  Medication Information Are there any Medication discrepancies?: Yes Details:  Symbicort 160-4.55mcg/act-06/06/21 (>5 days gap[facility] Zetia 10mg -04/03/21  (>5 days gap) 90DS [facility] Doxazosin 4mg -02/13/21  (>5 days gap) 90DS [facility] Lisinopril 5mg -12/27/20  (>5 days gap) 90DS by [facility] Metformin 500mg -11/08/20  (>5 days gap) 180DS by [facility] Nexletol 180mg -10/29/20  (>5 days gap) 30DS [facility]  Are there any Medication adherence gaps (beyond 5 days past due)?: Yes Details:  Symbicort 160-4.39mcg/act-06/06/21 (>5 days gap) [facility] Zetia 10mg -04/03/21  (>5 days gap) 90DS [facility] Doxazosin 4mg -02/13/21  (>5 days gap) 90DS [facility] Lisinopril 5mg -12/27/20  (>5 days gap) 90DS by [facility] Metformin 500mg -11/08/20  (>5 days gap) 180DS by [facility] Nexletol 180mg -10/29/20  (>5 days gap) 30DS [facility]  Medication adherence rates for the STAR rating drugs:  Lisinopril 5mg -12/27/20 90DS Metformin 500mg -11/08/20  180DS  List Patient's current Care Gaps: No current Care Gaps identified  Disease Assessments Current BP: 129/74 Current HR: 64 taken on: 07/08/2021 Previous BP: 110/70  Previous HR: 76 taken on: 02/27/2021 Weight: 191 BMI: 27.41 Last GFR: 77 taken on: 02/27/2021 Why did the patient present?: CCM initial visit Marital status?: Married Details: David [name] Retired? Previous work?: Pt is retired and states that he is in bad shape. He is unable to walk due to foot pain. He also complains of memory changes. What does the patient do during the day?: He is unable to do much due to back pain and foot pain Who does the patient spend their time with and what do they do?: Wife Lifestyle habits such as diet and exercise?: Diet: Cornflakes for breakfast, chicken salad wrap for lunch and wife cooks dinner Coffee: 1 cup a day Water: 3-4 glasses of water a day Alcohol, tobacco, and illicit drug usage?: Alcohol: 2 drinks before dinner Smoking: Smokes a pipe 4-5 times a day What is the patient's sleep pattern?: No sleep issues How many hours per night does patient typically sleep?: 9-10 hours a day Patient pleased with health care they are receiving?: Yes Family, occupational, and living circumstances relevant to overall health?: Pt has a family history of lung cancer and Stroke in Father, breast cancer in Sister, lung Cancer in Brother and history of prostate Cancer in Son   Name and location of Current pharmacy: [id number] [place], [place] - [address] Current Rx insurance plan: Other Details: BCBS Are meds synced by current pharmacy?: Yes Are meds delivered by current pharmacy?: No - delivery available but patient prefers to not use Would patient benefit from direct intervention of clinical lead in dispensing process to optimize clinical outcomes?: Yes Are [facility] pharmacy services available where patient lives?: Yes Is patient disadvantaged  to use [facility]?: No [facility] services reviewed with patient and patient wishes to change pharmacy?: No Select reason patient declined to change pharmacies: Patient preference Does patient experience delays in picking up medications due to transportation concerns (getting to pharmacy)?: No Any additional demeanor/mood notes?: David [name] is an 86 year old male who presents for CCM initial visit. He has no cheif complaints at this time  Hypertension (HTN) Assess this condition today?: Yes Is patient able to obtain BP reading today?: Yes BP today is: 122/68 mmHg Heart Rate is: 83 Goal: <130/80 mmHG Hypertension Stage: Elevated (SBP: 120-129 and DBP < 80) Is Patient checking BP at home?: No How often does patient miss taking their blood pressure medications?: Never Has patient experienced hypotension, dizziness, falls or bradycardia?: No BP RPM device: Does patient qualify?: Yes We discussed: Smoking cessation, Limiting the amount of alcohol to 1 or 2 drinks per day., Reducing the amount of salt intake to 1500mg /per day., DASH diet:  following a diet emphasizing fruits and vegetables and low-fat dairy products along with whole grains, fish, poultry, and nuts. Reducing red meats and sugars. Assessment:: Controlled Drug: Lisinopril 5 mg QD Assessment: Appropriate, Effective, Safe, Accessible HC Follow up: N/A Pharmacist Follow up: Assess BP in office, HR, BMP, s/s cough  Hyperlipidemia/Dyslipidemia (HLD) Last Lipid panel on: 02/27/2021 TC (Goal<200): 131 LDL: 56 HDL (Goal>40): 54 TG (Goal<150): 130 ASCVD 10-year risk?is:: N/A due to Age > 79 Assess this condition today?: Yes LDL Goal: <70 Has patient tried and failed any HLD Medications?: Yes Medications failed: atorvastatin atorvastatin: Myalgias and cramping Does patient have any of the following diagnosis codes that will exclude them from statin therapy?: Myopathy, unspecified - G72.9 Verified ICD-10 code  G72.9 is submitted to EMR: Yes Check  present secondary causes (below) that can lead to increased cholesterol levels (multi-choice optional): Alcohol use We discussed: How a diet high in plant sterols (fruits/vegetables/nuts/whole grains/legumes) may reduce your cholesterol., Encouraged increasing fiber to a daily intake of 10-25g/day Assessment:: Controlled Drug: NEXLETOL 180 MG TABS 1 tab QD Assessment: Appropriate, Effective, Safe, Accessible Drug: ezetimibe (ZETIA) 10 MG tablet 1 tab QD Assessment: Appropriate, Effective, Safe, Accessible Additional Info: TSH WNL (1.7) HC Follow up: N/A Pharmacist Follow up: Assess lipid panel, LFTs, N/V/D  Prediabetes Current A1C: 5.6 taken on: 02/27/2021 Previous A1C: 5.7 taken on: 10/17/2020 Type: 2 The current microalbumin ratio is: 5 tested on: 02/23/2021 Results: Unknown Last DM Eye Exam on: 12/05/2020 Results: Normal Results: Unknown Assess this condition today?: Yes Goal A1C: < 6.5 % Type: Pre-Diabetes/Impaired Fasting Glucose We discussed: Low carbohydrate eating plan with an emphasis on whole grains, legumes, nuts, fruits, and vegetables and minimal refined and processed foods., How to recognize and treat signs of hypoglycemia. Assessment:: Controlled Drug: None HC Follow up: N/A Pharmacist Follow up: Assess A1c and FBG  BPH Current PSA: 0.82 taken on: 02/27/2021 Assess this condition today?: Yes Are you experiencing any side effects from your BPH medication?: Erection problems, Low sexual drive How long have you been experiencing these side effects?: A few years What changes have you made to your diet / lifestyle to help manage your BPH symptoms?: Reducing liquid consumption for 2 hours before bedtime Completing BPH AUA Questionnaire today?: No How would you feel if you had to live with your urinary condition the way it is now, no better, no worse, for the rest of your life?: Mostly Satisfied We discussed: Limit fluid intake  for 2-3 hours before bedtime, Double voiding Assessment:: Controlled Drug: doxazosin (CARDURA) 4 MG tablet 1 tab QD Assessment: Appropriate, Effective, Safe, Accessible Drug: Finasteride 5mg  QD Assessment: Appropriate, Effective, Safe, Accessible HC Follow up: N/A Pharmacist Follow up: Continue to assess BPH symptoms  Bronchiectasis Other Information: Pt states that he is currently taking Symbicort 160/4.5 2 inhalations once a day. He stated that it has helped a little with the overproduction of mucus in his lungs Assessment:: Controlled Drug: Symbicort 160/4.5 2 inhalations BID (Pt not taking correctly) Assessment: Appropriate, Effective, Safe, Accessible Plan to Start: Educated patient to take Symbicort 2 times a day to help with airway and mucus overproduction HC Follow up: N/A Pharmacist Follow up: Continue to assess symptoms, Assess adherence to inhaler  Tobacco Use Disorder Assess this condition today?: Yes Is patient currently Smoking or Vaping?: Yes How much does patient smoke/vape per day?: Patient smokes a pipe 4-5 times a day Have you ever tried to quit smoking?: No What holds you back from trying to quit smoking?: Patient enjoys smoking and shooting/hunting and does not want to stop either. Pt states he accepts that smoking may "takes him out earlier" Assessment:: Uncontrolled Drug: None HC Follow up: N/A Pharmacist Follow up: Recommend smoking cessation at next visit  Exercise, Diet and Non-Drug Coordination Needs Additional exercise counseling points. We discussed: incorporating flexibility, balance, and strength training exercises, decreasing sedentary behavior Additional diet counseling points. We discussed: key components of the DASH diet, key components of a low-carb eating plan, aiming to consume at least 8 cups of water day Discussed Non-Drug Care Coordination Needs: Yes Does Patient have Medication financial barriers?: No  Accountable Health Communities  Health-Related Social Needs Screening Tool -  SDOH  ([email or web address]) What is your living situation today? (ref #1): I have a steady place to live  Think about the place you live. Do you have problems with any of the following? (ref #2): None of the above Within the past 12 months, you worried that your food would run out before you got money to buy more (ref #3): Never true Within the past 12 months, the food you bought just didn't last and you didn't have money to get more (ref #4): Never true In the past 12 months, has lack of reliable transportation kept you from medical appointments, meetings, work or from getting things needed for daily living? (ref #5): No In the past 12 months, has the electric, gas, oil, or water company threatened to shut off services in your home? (ref #6): No How often does anyone, including family and friends, physically hurt you? (ref #7): Never (1) How often does anyone, including family and friends, insult or talk down to you? (ref #8): Never (1) How often does anyone, including friends and family, threaten you with harm? (ref #9): Never (1) How often does anyone, including family and friends, scream or curse at you? (ref #10): Never (1)  Engagement Notes David [name] on 08/07/2021 09:47 AM HC Chart/ CP prep: 35 minutes-LM 08/05/21 CPP Chart Review: 15 min CPP Office Visit: 38 min CPP Office Visit Documentation: 46 min CPP Coordination of Care: [facility] Care Plan Completion: 28 min CPP Care Plan Review: 30 min   David [name] on 08/07/2021 09:47 AM CPP F/u: 10/18/21: [facility] w [facility] 03/03/22: OV w [facility] 06/24/22 @ 3pm: CCM follow-up in office (Smoking cessation, How did appointment for memory changes go? BP and LDL)  Care Gaps: Shingrix, TdaP, COVID: Pt educated               David [name]. Jeannett Senior, PharmD  Clinical Pharmacist  [email or web address]  [phone]

## 2021-09-06 ENCOUNTER — Ambulatory Visit (INDEPENDENT_AMBULATORY_CARE_PROVIDER_SITE_OTHER): Payer: Medicare Other | Admitting: Diagnostic Neuroimaging

## 2021-09-06 ENCOUNTER — Encounter: Payer: Self-pay | Admitting: Diagnostic Neuroimaging

## 2021-09-06 ENCOUNTER — Telehealth: Payer: Self-pay | Admitting: Diagnostic Neuroimaging

## 2021-09-06 VITALS — BP 127/78 | HR 58 | Ht 70.0 in | Wt 195.0 lb

## 2021-09-06 DIAGNOSIS — G309 Alzheimer's disease, unspecified: Secondary | ICD-10-CM | POA: Diagnosis not present

## 2021-09-06 DIAGNOSIS — Z9861 Coronary angioplasty status: Secondary | ICD-10-CM | POA: Diagnosis not present

## 2021-09-06 DIAGNOSIS — I2510 Atherosclerotic heart disease of native coronary artery without angina pectoris: Secondary | ICD-10-CM | POA: Diagnosis not present

## 2021-09-06 DIAGNOSIS — R413 Other amnesia: Secondary | ICD-10-CM

## 2021-09-06 NOTE — Progress Notes (Signed)
[facility]  PATIENT: David [name] DOB: 09-Oct-1935  REFERRING CLINICIAN: [name], MD HISTORY FROM: patient REASON FOR VISIT: new consult   HISTORICAL  CHIEF COMPLAINT:  Chief Complaint  Patient presents with   New Patient (Initial Visit)    Rm 6. Accompanied by wife, [name]. NP Internal referral for vascular dementia. C/o occasional headache over the past month. MMSE 24/30.    HISTORY OF PRESENT ILLNESS:   86 year old male here for evaluation of memory loss.  Patient has had progressive short-term memory loss, difficulty with language, naming things, mainly noted by patient's wife over the past 2 years.  Symptoms worsen last 6 months.  This is starting to affect his day-to-day activities.  Not able to do more complex tasks like previously.  He used to work in the [employer] traveling all over the country and wild, managing several some people.  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  However in the last 1 to 2 years he has had a significant decline in his hobbies and activities.  He is having more problems with depression.  Having more issues with low back pain, status post 3 surgeries.  Patient's wife took him to neuropsychology testing in November 2022 and patient performed suboptimally.  Question of poor effort, depression or raises confounding factors but nevertheless patient did perform it a very low range.  Possibility of dementia was raised.  Patient also having some intermittent left-sided headaches.   REVIEW OF SYSTEMS: Full 14 system review of systems performed and negative with exception of: as per HPI.   ALLERGIES: Allergies  Allergen Reactions   Atorvastatin Other (See Comments)    MYALGIAS CRAMPING   Crestor [Rosuvastatin]     MYALGIAS CRAMPING   Statins     MYALGIAS CRAMPING   Tetanus Toxoids     UNSPECIFIED REACTION     HOME MEDICATIONS: Outpatient Medications Prior to Visit  Medication  Sig Dispense Refill   aspirin EC 81 MG tablet Take 1 tablet (81 mg total) by mouth daily. 30 tablet 11   budesonide-formoterol (SYMBICORT) 160-4.5 MCG/ACT inhaler Use  2 Inhalations (15 minutes apart)  2 x / day 3 each 3   CINNAMON PO Take 1,000 mg by mouth daily.      Cyanocobalamin (VITAMIN B-12 SL) Place 2 tablets under the tongue once a week.     doxazosin (CARDURA) 4 MG tablet TAKE 1 TABLET AT BEDTIME FOR BP & PROSTATE 90 tablet 3   ezetimibe (ZETIA) 10 MG tablet TAKE 1 TABLET BY MOUTH EVERY DAY 90 tablet 3   finasteride (PROSCAR) 5 MG tablet Take 5 mg by mouth daily.     lisinopril (ZESTRIL) 5 MG tablet TAKE 1 TABLET BY MOUTH EVERY DAY 90 tablet 3   Magnesium 500 MG CAPS Take 1 capsule by mouth daily.     metFORMIN (GLUCOPHAGE-XR) 500 MG 24 hr tablet TAKE 2 TABLETS 2 TIMES A DAY WITH MEALS FOR DIABETES 360 tablet 3   NEXLETOL 180 MG TABS TAKE 1 TABLET BY MOUTH EVERY DAY 30 tablet 10   Spacer/Aero-Holding Chambers (AEROCHAMBER PLUS) inhaler Use spacer with Inhaler 3 each 1   VITAMIN D PO Take 10,000 Units by mouth daily. Takes 10,000 units three times a week     No facility-administered medications prior to visit.    PAST MEDICAL HISTORY: Past Medical History:  Diagnosis Date   Arthritis    BPH (benign prostatic hyperplasia)    CAD S/P percutaneous coronary  angioplasty 12/2009; 08/2010   a) 6/'22 - NSTEMI: PCI to LAD: Promus Element 2.5 mm x 15 mm DES; b) Cath for Angina: Prox LAD 60-70% pre-stent with FFR 0.82, 90% RVM -- Med Rx, EF 50-55%   Essential hypertension    Hyperlipidemia with target LDL less than 70    Statin intolerance   Non-ST elevation MI (NSTEMI) [facility]) June 2011   PCI to LAD   Pneumonia    DEC 2016  TX WITH ANTIBIOTIC   Type 2 diabetes mellitus without complications ([place])     PAST SURGICAL HISTORY: Past Surgical History:  Procedure Laterality Date   CARDIAC CATHETERIZATION  12/2009   Proximal LAD stenosis followed by a significant 80-90% distal stenosis    CARDIAC CATHETERIZATION  February 2012    90% ostial RV marginal branch; 60-70% proximal LAD with widely patent distal stent. FFR 0.82; medical therapy   CATARACT EXTRACTION W/ INTRAOCULAR LENS  IMPLANT, BILATERAL     CORONARY ANGIOPLASTY  12/2009   PTCA to proximal LAD; PCI with Promus Element DES stent  2.5 mm x 15 mm  distalLAD - .for non-ST elevation MI   DENTAL SURGERY     LUMBAR LAMINECTOMY/DECOMPRESSION MICRODISCECTOMY N/A 08/09/2015   Procedure: Lumbar Four-Five, Lumbar Five- Sacral One Decompressive Lumbar Laminectomy with Resection of Synovial Cyst;  Surgeon: Erline Levine, MD;  Location: [facility];  Service: Neurosurgery;  Laterality: N/A;  L4 to S1 Decompressive Lumbar Laminectomy   LUMBAR LAMINECTOMY/DECOMPRESSION MICRODISCECTOMY Right 01/21/2016   Procedure: Redo Right Lumbar Five-Sacral One Laminectomy for synovial cyst;  Surgeon: Erline Levine, MD;  Location: [place];  Service: Neurosurgery;  Laterality: Right;  right   LUMBAR LAMINECTOMY/DECOMPRESSION MICRODISCECTOMY Right 10/08/2017   Procedure: Right Lumbar three-four Laminectomy for synovial cyst;  Surgeon: Erline Levine, MD;  Location: [facility];  Service: Neurosurgery;  Laterality: Right;   NASAL FRACTURE SURGERY     TRANSTHORACIC ECHOCARDIOGRAM  June 2011   - (EF not reported) Moderately dilated LV; moderate hypokinesis of anteroseptal and anterior wall consistent with MI. Grade 1 diastolic dysfunction. Mild to moderately dilated LA   VASECTOMY      FAMILY HISTORY: Family History  Problem Relation Age of Onset   Cancer Father        lung   Stroke Father    Cancer Sister        breast   Cancer Brother        lung   Cancer Son        history of prostate    SOCIAL HISTORY: Social History   Socioeconomic History   Marital status: Married    Spouse name: Not on file   Number of children: Not on file   Years of education: Not on file   Highest education level: Not on file  Occupational History   Not on file   Tobacco Use   Smoking status: Every Day    Types: Pipe   Smokeless tobacco: Never   Tobacco comments:    smokes pipe qd  Vaping Use   Vaping Use: Never used  Substance and Sexual Activity   Alcohol use: Yes    Alcohol/week: 7.0 standard drinks    Types: 7 Standard drinks or equivalent per week    Comment: drinks 4 ounces of vodka daily   Drug use: No   Sexual activity: Not on file  Other Topics Concern   Not on file  Social History Narrative   He is a married father of 2 with  no grandchildren as of yet. He continued to smoke a pipe several times the course of day. He does have an occasional alcoholic beverage. While not involved a standard exercise routine, he is very active with walking and working in the yard, splitting wood and doing aggressive yard work including Production designer, theatre/television/film.   Social Determinants of Health   Financial Resource Strain: Not on file  Food Insecurity: No Food Insecurity   Worried About Charity fundraiser in the Last Year: Never true   Ran Out of Food in the Last Year: Never true  Transportation Needs: No Transportation Needs   Lack of Transportation (Medical): No   Lack of Transportation (Non-Medical): No  Physical Activity: Not on file  Stress: Not on file  Social Connections: Not on file  Intimate Partner Violence: Not on file     PHYSICAL EXAM  GENERAL EXAM/CONSTITUTIONAL: Vitals:  Vitals:   09/06/21 0919  BP: 127/78  Pulse: (!) 58  Weight: 195 lb (88.5 kg)  Height: 5\' 10"  (1.778 m)   Body mass index is 27.98 kg/m. Wt Readings from Last 3 Encounters:  09/06/21 195 lb (88.5 kg)  07/08/21 191 lb (86.6 kg)  02/28/21 189 lb 12.8 oz (86.1 kg)   Patient is in no distress; well developed, nourished and groomed; neck is supple  CARDIOVASCULAR: Examination of carotid arteries is normal; no carotid bruits Regular rate and rhythm, no murmurs Examination of peripheral vascular system by observation and palpation is  normal  EYES: Ophthalmoscopic exam of optic discs and posterior segments is normal; no papilledema or hemorrhages No results found.  MUSCULOSKELETAL: Gait, strength, tone, movements noted in Neurologic exam below  NEUROLOGIC: MENTAL STATUS:  MMSE - Mini Mental State Exam 09/06/2021 09/29/2019  Orientation to time 4 5  Orientation to Place 4 5  Registration 3 3  Attention/ Calculation 5 5  Recall 1 3  Language- name 2 objects 2 2  Language- repeat 1 1  Language- follow 3 step command 2 3  Language- read & follow direction 0 1  Write a sentence 1 1  Copy design 1 1  Total score 24 30   awake, alert, oriented to person, place and time DECR MEMORY DECR attention and concentration language fluent, comprehension intact, naming intact fund of knowledge appropriate  CRANIAL NERVE:  2nd - no papilledema on fundoscopic exam 2nd, 3rd, 4th, 6th - pupils equal and reactive to light, visual fields full to confrontation, extraocular muscles intact, no nystagmus 5th - facial sensation symmetric 7th - facial strength symmetric 8th - hearing DECR 9th - palate elevates symmetrically, uvula midline 11th - shoulder shrug symmetric 12th - tongue protrusion midline  MOTOR:  normal bulk and tone, full strength in the BUE, BLE  SENSORY:  normal and symmetric to light touch, temperature, vibration  COORDINATION:  finger-nose-finger, fine finger movements normal  REFLEXES:  deep tendon reflexes TRACE and symmetric  GAIT/STATION:  narrow based gait     DIAGNOSTIC DATA (LABS, IMAGING, TESTING) - I reviewed patient records, labs, notes, testing and imaging myself where available.  Lab Results  Component Value Date   WBC 8.8 02/28/2021   HGB 14.8 02/28/2021   HCT 44.7 02/28/2021   MCV 94.9 02/28/2021   PLT 269 02/28/2021      Component Value Date/Time   NA 139 02/28/2021 1501   NA 141 09/15/2019 1019   K 4.3 02/28/2021 1501   CL 103 02/28/2021 1501   CO2 29 02/28/2021 1501  GLUCOSE 122 (H) 02/28/2021 1501   BUN 18 02/28/2021 1501   BUN 12 09/15/2019 1019   CREATININE 0.98 02/28/2021 1501   CALCIUM 9.8 02/28/2021 1501   PROT 6.5 02/28/2021 1501   PROT 6.9 09/15/2019 1019   ALBUMIN 4.3 09/15/2019 1019   AST 15 02/28/2021 1501   ALT 10 02/28/2021 1501   ALKPHOS 57 09/15/2019 1019   BILITOT 0.5 02/28/2021 1501   BILITOT 0.6 09/15/2019 1019   GFRNONAA 82 10/18/2020 1205   GFRAA 95 10/18/2020 1205   Lab Results  Component Value Date   CHOL 131 02/28/2021   HDL 54 02/28/2021   LDLCALC 56 02/28/2021   TRIG 130 02/28/2021   CHOLHDL 2.4 02/28/2021   Lab Results  Component Value Date   HGBA1C 5.6 02/28/2021   Lab Results  Component Value Date   IZTIWPYK99 833 07/08/2021   Lab Results  Component Value Date   TSH 1.71 02/28/2021     06/11/21 neuropsych testing "Mr. Waldreps performance evidences major neurocognitive challenges at the level of dementia. However, there were behaviors observed during testing indicating possible suboptimal effort that make it hard to fully trust the results. I suspect that these findings represent the combination of both disinterest in testing as well as an underlying organic process such as Alzheimers disease. Severe depression and low normal B12 may also be contributing. Overall, I recommend being proactive in treating these as cognitive deficits and neuropsychology will reassess in the future as warranted."     ASSESSMENT AND PLAN  86 y.o. year old male here with:   Dx:  1. Memory loss   2. Alzheimer's disease, unspecified (CODE) ([place])      PLAN:  MILD DEMENTIA (also with hearing loss, B12 deficiency, alcohol use, depression) - check MRI brain - consider memantine 10mg  at bedtime; increase to twice a day after 1-2 weeks - consider SSRI for depression - safety / supervision issues reviewed - daily physical activity / exercise (at least 15-30 minutes) - eat more plants / vegetables - increase social  activities, brain stimulation, games, puzzles, hobbies, crafts, arts, music - aim for at least 7-8 hours sleep per night (or more) - avoid smoking and alcohol - caregiver resources provided - caution with medications, finances, driving  Orders Placed This Encounter  Procedures   MR [place]   Return for pending test results, pending if symptoms worsen or fail to improve.  I spent 60 minutes of face-to-face and non-face-to-face time with patient.  This included previsit chart review, lab review, study review, order entry, electronic health record documentation, patient education.     Penni Bombard, MD 03/14/538, 76:73 AM Certified in Neurology, Neurophysiology and Neuroimaging  [facility] [address], [place], [place] [address] [phone]

## 2021-09-06 NOTE — Telephone Encounter (Signed)
Medicare/bcbs supp order sent to [facility], [facility] they will reach out to the patient to schedule. 

## 2021-09-06 NOTE — Patient Instructions (Addendum)
°  MILD DEMENTIA (also with hearing loss, B12 deficiency, alcohol use, depression) - check MRI brain - consider memantine 10mg  at bedtime; increase to twice a day after 1-2 weeks - consider SSRI for depression - safety / supervision issues reviewed - daily physical activity / exercise (at least 15-30 minutes) - eat more plants / vegetables - increase social activities, brain stimulation, games, puzzles, hobbies, crafts, arts, music - aim for at least 7-8 hours sleep per night (or more) - avoid smoking and alcohol - caregiver resources provided - caution with medications, finances, driving

## 2021-09-16 ENCOUNTER — Telehealth: Payer: Self-pay

## 2021-09-16 ENCOUNTER — Other Ambulatory Visit: Payer: Self-pay

## 2021-09-16 ENCOUNTER — Ambulatory Visit
Admission: RE | Admit: 2021-09-16 | Discharge: 2021-09-16 | Disposition: A | Discharge status: Home / Self Care | Payer: Medicare Other | Source: Ambulatory Visit | Attending: Diagnostic Neuroimaging | Admitting: Diagnostic Neuroimaging

## 2021-09-16 DIAGNOSIS — G309 Alzheimer's disease, unspecified: Secondary | ICD-10-CM | POA: Diagnosis not present

## 2021-09-16 NOTE — Telephone Encounter (Signed)
-----   Message from Penni Bombard, MD sent at 09/16/2021  4:20 PM EST ----- Progression of atrophy. No other issues. Continue current plan. -VRP

## 2021-09-16 NOTE — Telephone Encounter (Signed)
I called the wife and relayed results. She verbalized understanding and appreciation for the call.

## 2021-09-17 ENCOUNTER — Ambulatory Visit: Payer: Self-pay | Admitting: Diagnostic Neuroimaging

## 2021-09-22 ENCOUNTER — Other Ambulatory Visit: Payer: Self-pay | Admitting: Cardiology

## 2021-09-30 DIAGNOSIS — M5416 Radiculopathy, lumbar region: Secondary | ICD-10-CM | POA: Diagnosis not present

## 2021-10-16 NOTE — Progress Notes (Signed)
MEDICARE ANNUAL WELLNESS VISIT AND FOLLOW UP ? ?Assessment:  ? ?David [name] was seen today for follow-up. ? ?Diagnoses and all orders for this visit: ? ?Encounter for Medicare annual wellness exam ?-Yearly ? ?Memory difficulties ?- Scored 21/30 on MMSE today ?- Increasing difficulty with names ?- Started on Aricept 10 mg QD, given memory compensation strategies ?- Discussed need to stop driving ? ?Essential hypertension ?Continue current medications: lisinopril '5mg'$  daily ?Monitor blood pressure at home; call if consistently over 130/80 ?Continue DASH diet.   ?Reminder to go to the ER if any CP, SOB, nausea, dizziness, severe HA, changes vision/speech, left arm numbness and tingling and jaw pain. ?-     CBC with Differential/Platelet ?-     COMPLETE METABOLIC PANEL WITH GFR ? ?Hyperlipidemia associated with type 2 diabetes mellitus ([place]) ?Continue medications: Zetia '10mg'$  daily and nextletol ?Discussed dietary and exercise modifications ?Low fat diet ?-     Lipid panel ? ?Type 2 diabetes mellitus with stage 2 chronic kidney disease, without long-term current use of insulin ([place]) ?Continue medications: Metformin '500mg'$ XL two tablets twice a day. ?Discussed general issues about diabetes pathophysiology and management. ?Education: Reviewed ?ABCs? of diabetes management (respective goals in parentheses):  A1C (<7), blood pressure (<130/80), and cholesterol (LDL <70) ?Dietary recommendations ?Encouraged aerobic exercise.  ?Discussed foot care, check daily ?Yearly retinal exam ?Dental exam every 6 months ?Monitor blood glucose, discussed goal for patient ?-     Hemoglobin A1c ? ?Vitamin D deficiency ?Continue supplementation ?Taking Vitamin D 10,000 IU three times a week. ? ?CAD S/P percutaneous coronary angioplasty and PCI- DES mid LAD in the setting of non-STEMI ? Control blood pressure, lipids and glucose ?Disscused lifestyle modifications, diet & exercise ?Continue to monitor ?Followed by Cardiology ? ?Type II diabetes  mellitus with nephropathy ([place]) ?Doing well at this time ?Continue to monitor ? ?Aortic atherosclerosis ([place]) ?Control blood pressure, lipids and glucose ?Disscused lifestyle modifications, diet & exercise ?Continue to monitor ? ?Diabetic retinopathy of both eyes associated with type 2 diabetes mellitus, macular edema presence unspecified, unspecified retinopathy severity ([place]) ?Aggressively control sugars and BP ?Followed by ophthalmology ? ?BPH with obstruction/lower urinary tract symptoms ?Doing well at this time ?Continue medications: Cardura '4mg'$ , finasteride '5mg'$ . ?Will continue to monitor ?Defer PSA this check ? ?Senile purpura ([place]) ? Discussed process, protect skin, sunscreen ? ?Tobacco dependency ?Discussed tobacco cessation ?Not ready to quit at this time ? ?Overweight (BMI 25.0-29.9) ?- continue medications, stress management techniques discussed, increase water, good sleep hygiene discussed, increase exercise, and increase veggies.  ? ?Medication management ?Continued ? ?Lung cancer screening ?-X-ray order placed today ?Aged out of CT low dose screening ? ? ?Over 40 minutes of face to face interview, exam, counseling, chart review, and critical decision making was performed ? ?Future Appointments  ?Date Time Provider [place]  ?03/03/2022  3:00 PM [name], MD [facility] None  ?06/24/2022  3:00 PM Newton Pigg, [facility] None  ?10/22/2022 11:00 AM Magda Bernheim, NP [facility] None  ? ? ? ?Plan:  ? ?During the course of the visit the patient was educated and counseled about appropriate screening and preventive services including:  ? ?Pneumococcal vaccine  ?Influenza vaccine ?Prevnar 13 ?Td vaccine ?Screening electrocardiogram ?Colorectal cancer screening ?Diabetes screening ?Glaucoma screening ?Nutrition counseling  ? ? ?Subjective:  ?David [name] is a 86 y.o. male who presents for Medicare Annual Wellness Visit and 3 month follow up for HTN, HLD, T2DM, and vitamin D Def.  ? ?David [name] David [name]  having  a lot of difficulty remembering names.  Can remember David [name] wife, son and daughters name but otherwise can not remember. David [name] is starting to have more difficulty driving but has not forgotten where David [name] was or how to get home. ? ?Patient had back surgery in 09/2017 by Dr. Vertell Limber to have a cyst removed.  Reports David [name] does still have some pain in David [name] R leg, but has sensation back.  David [name] is using a cane for mobility when needed. David [name] gets injections in back which helps with pain but does have weakness in David [name] legs.  ? ?BMI is Body mass index is 27.41 kg/m?., David [name] has been working on diet, exercise limited. David [name] watches bread and sugar intake.  ?Wt Readings from Last 3 Encounters:  ?10/18/21 191 lb (86.6 kg)  ?09/06/21 195 lb (88.5 kg)  ?07/08/21 191 lb (86.6 kg)  ? ?David [name] blood pressure has been controlled at home, today their BP is BP: 130/80 ?David [name] does workout, but limited. David [name] denies chest pain, shortness of breath, dizziness.  ? ?Smokes pipe daily. David [name] is not ready to cut back or quit. David [name] will cough up white/yellow mucus occasionally.  David [name] is taking Symbicort daily ?  ?In 2011 had NSTEMI w/ PCA/DES to LAD.  Doing well and follows with Dr Ellyn Hack. ? ?David [name] is on cholesterol medication (zetia due to statin intolerance) and nextletol denies myalgias. David [name] cholesterol is at goal. The cholesterol last visit was:   ?Lab Results  ?Component Value Date  ? CHOL 143 09/15/2019  ? HDL 61 09/15/2019  ? [place] 68 09/15/2019  ? TRIG 71 09/15/2019  ? CHOLHDL 2.3 09/15/2019  ? ?David [name] has been working on diet and exercise for T2DM controlled on metformin and diet, and denies foot ulcerations, hyperglycemia, hypoglycemia , increased appetite, nausea, paresthesia of the feet, polydipsia, polyuria, visual disturbances, vomiting and weight loss. Last A1C in the office was:  ?Last A1C was:  ?Lab Results  ?Component Value Date  ? HGBA1C 5.6 02/28/2021  ? ?  ?Lab Results  ?Component Value Date  ? GFRNONAA 82 10/18/2020  ? ? ? ?Patient is on Vitamin D3 supplement  for defciency.  Last result was to goal.  ?Lab Results  ?Component Value Date  ? VD25OH 71 02/28/2021  ? ? ? ?Medication Review: ?Current Outpatient Medications on File Prior to Visit  ?Medication Sig Dispense Refill  ? aspirin EC 81 MG tablet Take 1 tablet (81 mg total) by mouth daily. 30 tablet 11  ? budesonide-formoterol (SYMBICORT) 160-4.5 MCG/ACT inhaler Use  2 Inhalations (15 minutes apart)  2 x / day 3 each 3  ? CINNAMON PO Take 1,000 mg by mouth daily.     ? Cyanocobalamin (VITAMIN B-12 SL) Place 2 tablets under the tongue once a week.    ? doxazosin (CARDURA) 4 MG tablet TAKE 1 TABLET AT BEDTIME FOR BP & PROSTATE 90 tablet 3  ? ezetimibe (ZETIA) 10 MG tablet TAKE 1 TABLET BY MOUTH EVERY DAY 90 tablet 3  ? finasteride (PROSCAR) 5 MG tablet Take 5 mg by mouth daily.    ? lisinopril (ZESTRIL) 5 MG tablet TAKE 1 TABLET BY MOUTH EVERY DAY 90 tablet 3  ? Magnesium 500 MG CAPS Take 1 capsule by mouth daily.    ? NEXLETOL 180 MG TABS TAKE 1 TABLET BY MOUTH EVERY DAY 30 tablet 10  ? Spacer/Aero-Holding Chambers (AEROCHAMBER PLUS) inhaler Use spacer with Inhaler 3 each 1  ? VITAMIN D PO Take 10,000 Units by mouth daily. Takes 10,000  units three times a week    ? ?No current facility-administered medications on file prior to visit.  ? ? ?Current Problems (verified) ?Patient Active Problem List  ? Diagnosis Date Noted  ? Current severe episode of major depressive disorder without psychotic features ([place]) 06/25/2021  ? Major neurocognitive disorder ([place]) 06/25/2021  ? Myalgia due to statin 08/30/2020  ? Venous stasis dermatitis of both lower extremities 08/30/2020  ? B12 deficiency 04/13/2020  ? Memory changes 04/12/2020  ? Retinopathy, diabetic, bilateral ([place]) 02/01/2018  ? Aortic atherosclerosis ([place]) by CT scan 2017 01/08/2017  ? Low back pain 06/12/2015  ? Synovial cyst 06/06/2015  ? Spondylolisthesis 06/06/2015  ? BPH (benign prostatic hyperplasia) 11/16/2014  ? Vitamin D deficiency 12/25/2013  ? Medication  management 12/25/2013  ? Overweight (BMI 25.0-29.9) 02/23/2013  ?  Class: Chronic  ? Tobacco dependency 02/22/2013  ? Hyperlipidemia associated with type 2 diabetes mellitus ([place])   ? Essential hypertension   ? T

## 2021-10-18 ENCOUNTER — Other Ambulatory Visit: Payer: Self-pay | Admitting: Adult Health

## 2021-10-18 ENCOUNTER — Ambulatory Visit
Admission: RE | Admit: 2021-10-18 | Discharge: 2021-10-18 | Disposition: A | Discharge status: Home / Self Care | Payer: Medicare Other | Source: Ambulatory Visit | Attending: Nurse Practitioner | Admitting: Nurse Practitioner

## 2021-10-18 ENCOUNTER — Encounter: Payer: Self-pay | Admitting: Nurse Practitioner

## 2021-10-18 ENCOUNTER — Ambulatory Visit (INDEPENDENT_AMBULATORY_CARE_PROVIDER_SITE_OTHER): Payer: Medicare Other | Admitting: Nurse Practitioner

## 2021-10-18 VITALS — BP 130/80 | HR 58 | Temp 97.9°F | Resp 16 | Ht 70.0 in | Wt 191.0 lb

## 2021-10-18 DIAGNOSIS — E1121 Type 2 diabetes mellitus with diabetic nephropathy: Secondary | ICD-10-CM

## 2021-10-18 DIAGNOSIS — Z122 Encounter for screening for malignant neoplasm of respiratory organs: Secondary | ICD-10-CM

## 2021-10-18 DIAGNOSIS — E11319 Type 2 diabetes mellitus with unspecified diabetic retinopathy without macular edema: Secondary | ICD-10-CM | POA: Diagnosis not present

## 2021-10-18 DIAGNOSIS — E663 Overweight: Secondary | ICD-10-CM

## 2021-10-18 DIAGNOSIS — R413 Other amnesia: Secondary | ICD-10-CM

## 2021-10-18 DIAGNOSIS — I700 Atherosclerosis of aorta: Secondary | ICD-10-CM

## 2021-10-18 DIAGNOSIS — N182 Chronic kidney disease, stage 2 (mild): Secondary | ICD-10-CM | POA: Diagnosis not present

## 2021-10-18 DIAGNOSIS — N401 Enlarged prostate with lower urinary tract symptoms: Secondary | ICD-10-CM

## 2021-10-18 DIAGNOSIS — R6889 Other general symptoms and signs: Secondary | ICD-10-CM

## 2021-10-18 DIAGNOSIS — I2510 Atherosclerotic heart disease of native coronary artery without angina pectoris: Secondary | ICD-10-CM | POA: Diagnosis not present

## 2021-10-18 DIAGNOSIS — E1169 Type 2 diabetes mellitus with other specified complication: Secondary | ICD-10-CM

## 2021-10-18 DIAGNOSIS — R350 Frequency of micturition: Secondary | ICD-10-CM

## 2021-10-18 DIAGNOSIS — E785 Hyperlipidemia, unspecified: Secondary | ICD-10-CM | POA: Diagnosis not present

## 2021-10-18 DIAGNOSIS — E1122 Type 2 diabetes mellitus with diabetic chronic kidney disease: Secondary | ICD-10-CM

## 2021-10-18 DIAGNOSIS — I10 Essential (primary) hypertension: Secondary | ICD-10-CM

## 2021-10-18 DIAGNOSIS — Z0001 Encounter for general adult medical examination with abnormal findings: Secondary | ICD-10-CM

## 2021-10-18 DIAGNOSIS — Z0000 Encounter for general adult medical examination without abnormal findings: Secondary | ICD-10-CM

## 2021-10-18 DIAGNOSIS — F17200 Nicotine dependence, unspecified, uncomplicated: Secondary | ICD-10-CM | POA: Diagnosis not present

## 2021-10-18 DIAGNOSIS — Z79899 Other long term (current) drug therapy: Secondary | ICD-10-CM | POA: Diagnosis not present

## 2021-10-18 DIAGNOSIS — Z9861 Coronary angioplasty status: Secondary | ICD-10-CM

## 2021-10-18 DIAGNOSIS — R053 Chronic cough: Secondary | ICD-10-CM | POA: Diagnosis not present

## 2021-10-18 DIAGNOSIS — D692 Other nonthrombocytopenic purpura: Secondary | ICD-10-CM | POA: Diagnosis not present

## 2021-10-18 MED ORDER — DONEPEZIL HCL 10 MG PO TABS: 10.0000 mg | ORAL_TABLET | Freq: Every day | ORAL | 2 refills | Status: DC

## 2021-10-18 NOTE — Patient Instructions (Signed)
Chest xray has been ordered at [place] [address] ?Can walk in to have completed  ?M-F 8:30- 3:45 ? ? ?Memory Compensation Strategies ? ?Use "WARM" strategy. ? W= write it down ? A= associate it ? R= repeat it ? M= make a mental note ? ?2.   You can keep a Social worker. ? Use a 3-ring notebook with sections for the following: calendar, important names and phone numbers,  medications, doctors' names/phone numbers, lists/reminders, and a section to journal what you did  each day.  ? ?3.    Use a calendar to write appointments down. ? ?4.    Write yourself a schedule for the day. ? This can be placed on the calendar or in a separate section of the Memory Notebook.  Keeping a  regular schedule can help memory. ? ?5.    Use medication organizer with sections for each day or morning/evening pills. ? You may need help loading it ? ?6.    Keep a basket, or pegboard by the door. ? Place items that you need to take out with you in the basket or on the pegboard.  You may also want to  include a message board for reminders. ? ?7.    Use sticky notes. ? Place sticky notes with reminders in a place where the task is performed.  For example: " turn off the  stove" placed by the stove, "lock the door" placed on the door at eye level, " take your medications" on  the bathroom mirror or by the place where you normally take your medications. ? ?8.    Use alarms/timers. ? Use while cooking to remind yourself to check on food or as a reminder to take your medicine, or as a  reminder to make a call, or as a reminder to perform another task, etc. ? ?Donepezil Tablets ?What is this medication? ?DONEPEZIL (doe NEP e zil) treats memory loss and confusion (dementia) in people who have Alzheimer disease. It works by improving attention, memory, and the ability to engage in daily activities. It is not a cure for dementia or Alzheimer disease. ?This medicine may be used for other purposes; ask your  health care provider or pharmacist if you have questions. ?COMMON BRAND NAME(S): Aricept ?What should I tell my care team before I take this medication? ?They need to know if you have any of these conditions: ?Asthma or other lung disease ?Difficulty passing urine ?Head injury ?Heart disease ?History of irregular heartbeat ?Liver disease ?Seizures (convulsions) ?Stomach or intestinal disease, ulcers or stomach bleeding ?An unusual or allergic reaction to donepezil, other medications, foods, dyes, or preservatives ?Pregnant or trying to get pregnant ?Breast-feeding ?How should I use this medication? ?Take this medication by mouth with a glass of water. Follow the directions on the prescription label. You may take this medication with or without food. Take this medication at regular intervals. This medication is usually taken before bedtime. Do not take it more often than directed. Continue to take your medication even if you feel better. Do not stop taking except on your care team's advice. ?If you are taking the 23 mg donepezil tablet, swallow it whole; do not cut, crush, or chew it. ?Talk to your care team about the use of this medication in children. Special care may be needed. ?Overdosage: If you think you have taken too much of this medicine contact a poison control center or emergency room at once. ?NOTE: This medicine  is only for you. Do not share this medicine with others. ?What if I miss a dose? ?If you miss a dose, take it as soon as you can. If it is almost time for your next dose, take only that dose, do not take double or extra doses. ?What may interact with this medication? ?Do not take this medication with any of the following: ?Certain medications for fungal infections like itraconazole, fluconazole, posaconazole, and voriconazole ?Cisapride ?Dextromethorphan; quinidine ?Dronedarone ?Pimozide ?Quinidine ?Thioridazine ?This medication may also interact with the following: ?Antihistamines for allergy,  cough and cold ?Atropine ?Bethanechol ?Carbamazepine ?Certain medications for bladder problems like oxybutynin, tolterodine ?Certain medications for Parkinson's disease like benztropine, trihexyphenidyl ?Certain medications for stomach problems like dicyclomine, hyoscyamine ?Certain medications for travel sickness like scopolamine ?Dexamethasone ?Dofetilide ?Ipratropium ?NSAIDs, medications for pain and inflammation, like ibuprofen or naproxen ?Other medications for Alzheimer's disease ?Other medications that prolong the QT interval (cause an abnormal heart rhythm) ?Phenobarbital ?Phenytoin ?Rifampin, rifabutin or rifapentine ?Ziprasidone ?This list may not describe all possible interactions. Give your health care provider a list of all the medicines, herbs, non-prescription drugs, or dietary supplements you use. Also tell them if you smoke, drink alcohol, or use illegal drugs. Some items may interact with your medicine. ?What should I watch for while using this medication? ?Visit your care team for regular checks on your progress. Check with your care team if your symptoms do not get better or if they get worse. ?You may get drowsy or dizzy. Do not drive, use machinery, or do anything that needs mental alertness until you know how this medication affects you. ?What side effects may I notice from receiving this medication? ?Side effects that you should report to your care team as soon as possible: ?Allergic reactions--skin rash, itching, hives, swelling of the face, lips, tongue, or throat ?Peptic ulcer--burning stomach pain, loss of appetite, bloating, burping, heartburn, nausea, vomiting ?Seizures ?Slow heartbeat--dizziness, feeling faint or lightheaded, confusion, trouble breathing, unusual weakness or fatigue ?Stomach bleeding--bloody or black, tar-like stools, vomiting blood or brown material that looks like coffee grounds ?Trouble passing urine ?Side effects that usually do not require medical attention  (report these to your care team if they continue or are bothersome): ?Diarrhea ?Fatigue ?Loss of appetite ?Muscle pain or cramps ?Nausea ?Trouble sleeping ?This list may not describe all possible side effects. Call your doctor for medical advice about side effects. You may report side effects to FDA at 1-800-FDA-1088. ?Where should I keep my medication? ?Keep out of reach of children. ?Store at room temperature between 15 and 30 degrees C (59 and 86 degrees F). Throw away any unused medication after the expiration date. ?NOTE: This sheet is a summary. It may not cover all possible information. If you have questions about this medicine, talk to your doctor, pharmacist, or health care provider. ?? 2022 Elsevier/Gold Standard (2021-02-20 00:00:00) ? ?

## 2021-10-19 LAB — CBC WITH DIFFERENTIAL/PLATELET
Absolute Monocytes: 789 cells/uL (ref 200–950)
Basophils Absolute: 48 cells/uL (ref 0–200)
Basophils Relative: 0.7 %
Eosinophils Absolute: 258 cells/uL (ref 15–500)
Eosinophils Relative: 3.8 %
HCT: 43.2 % (ref 38.5–50.0)
Hemoglobin: 14.4 g/dL (ref 13.2–17.1)
Lymphs Abs: 1387 cells/uL (ref 850–3900)
MCH: 31.4 pg (ref 27.0–33.0)
MCHC: 33.3 g/dL (ref 32.0–36.0)
MCV: 94.1 fL (ref 80.0–100.0)
MPV: 11.1 fL (ref 7.5–12.5)
Monocytes Relative: 11.6 %
Neutro Abs: 4318 cells/uL (ref 1500–7800)
Neutrophils Relative %: 63.5 %
Platelets: 244 10*3/uL (ref 140–400)
RBC: 4.59 10*6/uL (ref 4.20–5.80)
RDW: 12.3 % (ref 11.0–15.0)
Total Lymphocyte: 20.4 %
WBC: 6.8 10*3/uL (ref 3.8–10.8)

## 2021-10-19 LAB — COMPLETE METABOLIC PANEL WITH GFR
AG Ratio: 1.8 (calc) (ref 1.0–2.5)
ALT: 8 U/L — ABNORMAL LOW (ref 9–46)
AST: 13 U/L (ref 10–35)
Albumin: 4.2 g/dL (ref 3.6–5.1)
Alkaline phosphatase (APISO): 46 U/L (ref 35–144)
BUN: 13 mg/dL (ref 7–25)
CO2: 28 mmol/L (ref 20–32)
Calcium: 9.6 mg/dL (ref 8.6–10.3)
Chloride: 104 mmol/L (ref 98–110)
Creat: 0.86 mg/dL (ref 0.70–1.22)
Globulin: 2.4 g/dL (calc) (ref 1.9–3.7)
Glucose, Bld: 82 mg/dL (ref 65–99)
Potassium: 4.4 mmol/L (ref 3.5–5.3)
Sodium: 140 mmol/L (ref 135–146)
Total Bilirubin: 0.5 mg/dL (ref 0.2–1.2)
Total Protein: 6.6 g/dL (ref 6.1–8.1)
eGFR: 86 mL/min/{1.73_m2} (ref >=60)

## 2021-10-19 LAB — LIPID PANEL
Cholesterol: 135 mg/dL (ref <200)
HDL: 54 mg/dL (ref >=40)
LDL Cholesterol (Calc): 66 mg/dL (calc)
Non-HDL Cholesterol (Calc): 81 mg/dL (calc) (ref <130)
Total CHOL/HDL Ratio: 2.5 (calc) (ref <5.0)
Triglycerides: 66 mg/dL (ref <150)

## 2021-10-19 LAB — HEMOGLOBIN A1C
Hgb A1c MFr Bld: 5.7 % of total Hgb — ABNORMAL HIGH (ref <5.7)
Mean Plasma Glucose: 117 mg/dL
eAG (mmol/L): 6.5 mmol/L

## 2021-12-14 ENCOUNTER — Other Ambulatory Visit: Payer: Self-pay | Admitting: Cardiology

## 2021-12-14 ENCOUNTER — Other Ambulatory Visit: Payer: Self-pay | Admitting: Adult Health

## 2022-01-03 ENCOUNTER — Other Ambulatory Visit: Payer: Self-pay | Admitting: Adult Health

## 2022-01-06 DIAGNOSIS — M5416 Radiculopathy, lumbar region: Secondary | ICD-10-CM | POA: Diagnosis not present

## 2022-01-12 ENCOUNTER — Other Ambulatory Visit: Payer: Self-pay | Admitting: Nurse Practitioner

## 2022-01-12 DIAGNOSIS — R413 Other amnesia: Secondary | ICD-10-CM

## 2022-01-13 ENCOUNTER — Ambulatory Visit (INDEPENDENT_AMBULATORY_CARE_PROVIDER_SITE_OTHER): Payer: Medicare Other | Admitting: Physician Assistant

## 2022-01-13 ENCOUNTER — Other Ambulatory Visit: Payer: Self-pay

## 2022-01-13 ENCOUNTER — Encounter: Payer: Self-pay | Admitting: Physician Assistant

## 2022-01-13 VITALS — BP 112/58 | HR 78 | Ht 70.0 in | Wt 187.0 lb

## 2022-01-13 DIAGNOSIS — E1169 Type 2 diabetes mellitus with other specified complication: Secondary | ICD-10-CM

## 2022-01-13 DIAGNOSIS — Z9861 Coronary angioplasty status: Secondary | ICD-10-CM

## 2022-01-13 DIAGNOSIS — I2510 Atherosclerotic heart disease of native coronary artery without angina pectoris: Secondary | ICD-10-CM | POA: Diagnosis not present

## 2022-01-13 DIAGNOSIS — E785 Hyperlipidemia, unspecified: Secondary | ICD-10-CM

## 2022-01-13 NOTE — Progress Notes (Signed)
Cardiology Office Note   Date:  01/13/2022   ID:  [name], DOB 23-Mar-1936, MRN [id number]  PCP:  Lucky Cowboy, MD Cardiologist:  Bryan Lemma, MD 08/22/2020 Electrphysiologist: None Theodore Demark, PA-C    History of Present Illness: David [name] is a 86 y.o. male with a history of NSTEMI 2011 s/p DES dLAD & PTCA pLAD, cath 2012 w/ med rx for 90% RV marginal branch, DM2, HTN, HLD intol statins on Zetia & Nexletol w/ PCP following labs, resting bradycardia so no BB, BPH, venous stasis dermatitis, memory changes w/ score 12/30 on MMSE 10/18/2021  08/2020 office visit, pt doing well, ok to hold ASA for procedures  Nelva [name] presents for cardiology follow-up  He has had 3 back surgeries in his life, his legs are weak from those.  His memory is getting worse.  However, he uses days of the week pillbox  If he walks much, his legs get numb, R>L and he has to stop. No cramping pains.   He used to get a little heart feeling, but no more. He attributes this to the Nexletol. No CP w/ exertion.  No palpitations, no presyncope or syncope.  No LE edema, no orthopnea or PND.   He lives with his wife of 63 years. She also has health problems, they have help with household chores. Their children do not live here.    Past Medical History:  Diagnosis Date   Arthritis    BPH (benign prostatic hyperplasia)    CAD S/P percutaneous coronary angioplasty 12/2009; 08/2010   a) 6/'22 - NSTEMI: PCI to LAD: Promus Element 2.5 mm x 15 mm DES; b) Cath for Angina: Prox LAD 60-70% pre-stent with FFR 0.82, 90% RVM -- Med Rx, EF 50-55%   Essential hypertension    Hyperlipidemia with target LDL less than 70    Statin intolerance   Non-ST elevation MI (NSTEMI) [facility]) June 2011   PCI to LAD   Pneumonia    DEC 2016  TX WITH ANTIBIOTIC   Type 2 diabetes mellitus without complications David [name])     Past Surgical History:  Procedure Laterality Date   CARDIAC CATHETERIZATION  12/2009    Proximal LAD stenosis followed by a significant 80-90% distal stenosis   CARDIAC CATHETERIZATION  February 2012    90% ostial RV marginal branch; 60-70% proximal LAD with widely patent distal stent. FFR 0.82; medical therapy   CATARACT EXTRACTION W/ INTRAOCULAR LENS  IMPLANT, BILATERAL     CORONARY ANGIOPLASTY  12/2009   PTCA to proximal LAD; PCI with Promus Element DES stent  2.5 mm x 15 mm  distalLAD - .for non-ST elevation MI   DENTAL SURGERY     LUMBAR LAMINECTOMY/DECOMPRESSION MICRODISCECTOMY N/A 08/09/2015   Procedure: Lumbar Four-Five, Lumbar Five- Sacral One Decompressive Lumbar Laminectomy with Resection of Synovial Cyst;  Surgeon: Maeola Harman, MD;  Location: [facility];  Service: Neurosurgery;  Laterality: N/A;  L4 to S1 Decompressive Lumbar Laminectomy   LUMBAR LAMINECTOMY/DECOMPRESSION MICRODISCECTOMY Right 01/21/2016   Procedure: Redo Right Lumbar Five-Sacral One Laminectomy for synovial cyst;  Surgeon: Maeola Harman, MD;  Location: [facility];  Service: Neurosurgery;  Laterality: Right;  right   LUMBAR LAMINECTOMY/DECOMPRESSION MICRODISCECTOMY Right 10/08/2017   Procedure: Right Lumbar three-four Laminectomy for synovial cyst;  Surgeon: Maeola Harman, MD;  Location: [facility] OR;  Service: Neurosurgery;  Laterality: Right;   NASAL FRACTURE SURGERY     TRANSTHORACIC ECHOCARDIOGRAM  June 2011   - (EF not reported)  Moderately dilated LV; moderate hypokinesis of anteroseptal and anterior wall consistent with MI. Grade 1 diastolic dysfunction. Mild to moderately dilated LA   VASECTOMY      Current Outpatient Medications  Medication Sig Dispense Refill   budesonide-formoterol (SYMBICORT) 160-4.5 MCG/ACT inhaler Use  2 Inhalations (15 minutes apart)  2 x / day 3 each 3   donepezil (ARICEPT) 10 MG tablet TAKE 1 TABLET BY MOUTH EVERYDAY AT BEDTIME 90 tablet 3   doxazosin (CARDURA) 4 MG tablet TAKE 1 TABLET AT BEDTIME FOR BP & PROSTATE 90 tablet 3   ezetimibe (ZETIA) 10 MG tablet TAKE 1 TABLET BY  MOUTH EVERY DAY 90 tablet 3   lisinopril (ZESTRIL) 5 MG tablet Take 1 tablet (5 mg total) by mouth daily. Patient must schedule appointment for future refills first attempt 30 tablet 0   metFORMIN (GLUCOPHAGE-XR) 500 MG 24 hr tablet TAKE 2 TABLETS 2 TIMES A DAY WITH MEALS FOR DIABETES 360 tablet 3   NEXLETOL 180 MG TABS TAKE 1 TABLET BY MOUTH EVERY DAY 30 tablet 10   Spacer/Aero-Holding Chambers (AEROCHAMBER PLUS) inhaler Use spacer with Inhaler 3 each 1   aspirin EC 81 MG tablet Take 1 tablet (81 mg total) by mouth daily. (Patient not taking: Reported on 01/13/2022) 30 tablet 11   CINNAMON PO Take 1,000 mg by mouth daily.  (Patient not taking: Reported on 01/13/2022)     Cyanocobalamin (VITAMIN B-12 SL) Place 2 tablets under the tongue once a week. (Patient not taking: Reported on 01/13/2022)     finasteride (PROSCAR) 5 MG tablet Take 5 mg by mouth daily. (Patient not taking: Reported on 01/13/2022)     Magnesium 500 MG CAPS Take 1 capsule by mouth daily. (Patient not taking: Reported on 01/13/2022)     VITAMIN D PO Take 10,000 Units by mouth daily. Takes 10,000 units three times a week (Patient not taking: Reported on 01/13/2022)     No current facility-administered medications for this visit.    Allergies:   Atorvastatin, Crestor [rosuvastatin], Statins, and Tetanus toxoids    Social History:  The patient  reports that he has been smoking pipe. He has never used smokeless tobacco. He reports current alcohol use of about 7.0 standard drinks of alcohol per week. He reports that he does not use drugs.   Family History:  The patient's family history includes Cancer in his brother, father, sister, and son; Stroke in his father.  He indicated that his mother is deceased. He indicated that his father is deceased. He indicated that his sister is alive. He indicated that his brother is deceased. He indicated that his son is alive.    ROS:  Please see the history of present illness. All other systems are  reviewed and negative.    PHYSICAL EXAM: VS:  BP (!) 112/58 (BP Location: Left Arm, Patient Position: Sitting, Cuff Size: Normal)   Pulse 78   Ht 5\' 10"  (1.778 m)   Wt 187 lb (84.8 kg)   SpO2 96%   BMI 26.83 kg/m  , BMI Body mass index is 26.83 kg/m. GEN: Well nourished, well developed, male in no acute distress HEENT: normal for age  Neck: no JVD, no carotid bruit, no masses Cardiac: RRR; no murmur, no rubs, or gallops Respiratory:  clear to auscultation bilaterally, normal work of breathing GI: soft, nontender, nondistended, + BS MS: no deformity or atrophy; no edema; distal pulses are 2+ in all 4 extremities  Skin: warm and dry, no rash Neuro:  Strength and sensation are intact Psych: euthymic mood, full affect   EKG:  EKG is ordered today. The ekg ordered today demonstrates SR, HR 78, LBBB is old but QRS duration 126 >> 128 >> 134 >> 140 ms  ECHO: None  CATH: 09/12/2010 PRELIMINARY ANGIOGRAPHIC FINDINGS: 1. Left main is a large-caliber vessel, bifurcates in circumflex     arteries.  There was no significant coronary artery disease, just     minimal luminal irregularities. 2. LAD starts off as a relative large-caliber vessel, and then after     the takeoff of the first diagonal branch, it does have a stepdown     to about 50% lesion, and then stays along tubular 40-50% lesion,     terminating in about 60-70% lesion at the takeoff of the next     second small diagonal branch.  Just following that branch, there is     a widely patent Promus drug-eluting stent and the vessel remained     in this caliber of the stent throughout the remainder of the     vessel.  The diagonal branch itself had significant arteries, but     had no significant disease. 3. Left circumflex artery is a large-caliber vessel, which bifurcates     into a large branching obtuse marginal with no significant disease     in the atrioventricular groove.  Circumflex itself also goes down     to provide  small posterolateral branches, but there is no     significant disease in these vessels. 4. The RCA is a large-caliber dominant vessel that did have a small to     moderate-caliber right ventricular marginal branch that has a 90%     lesion in the ostial portion.  There is a large sleeping RV     marginal branch that goes down around to the apex.  There is no     significant disease.  The remainder of the RCA continues on to give     rise to a posterior descending artery and a very small     posterolateral branch system.  There is no significant disease in     these vessels. 5. The left ventriculogram showed ejection fraction of 50-55% with no     significant wall motion abnormalities.  There was no pullback     gradient across the aortic valve. IMPRESSION: 1. Culprit lesion was likely the 90% ostial lesion in the right     ventricular marginal branch.  This was not a lesion or vessel that     is usually amenable to percutaneous intervention, and therefore it     is best for medical management. 2. Diffuse moderate, but nonobstructive coronary artery disease and     wide patent stent in the left anterior descending.  The     conglomerate fractional flow reserve in the proximal and mid     portion of the LAD is 0.81, which is not hemodynamically     significant. 3. Low normal ejection fraction of 50-55% with relatively normal left     ventricular end-diastolic pressure.   Recent Labs: 02/28/2021: Magnesium 2.1; TSH 1.71 10/18/2021: ALT 8; BUN 13; Creat 0.86; Hemoglobin 14.4; Platelets 244; Potassium 4.4; Sodium 140  CBC    Component Value Date/Time   WBC 6.8 10/18/2021 1139   RBC 4.59 10/18/2021 1139   HGB 14.4 10/18/2021 1139   HCT 43.2 10/18/2021 1139   PLT 244 10/18/2021 1139   MCV 94.1 10/18/2021 1139  MCH 31.4 10/18/2021 1139   MCHC 33.3 10/18/2021 1139   RDW 12.3 10/18/2021 1139   LYMPHSABS 1,387 10/18/2021 1139   MONOABS 936 01/08/2017 1123   EOSABS 258 10/18/2021 1139    BASOSABS 48 10/18/2021 1139      Latest Ref Rng & Units 10/18/2021   11:39 AM 02/28/2021    3:01 PM 10/18/2020   12:05 PM  CMP  Glucose 65 - 99 mg/dL 82  161  80   BUN 7 - 25 mg/dL 13  18  13    Creatinine 0.70 - 1.22 mg/dL 0.96  0.45  4.09   Sodium 135 - 146 mmol/L 140  139  139   Potassium 3.5 - 5.3 mmol/L 4.4  4.3  4.4   Chloride 98 - 110 mmol/L 104  103  106   CO2 20 - 32 mmol/L 28  29  28    Calcium 8.6 - 10.3 mg/dL 9.6  9.8  9.8   Total Protein 6.1 - 8.1 g/dL 6.6  6.5  6.4   Total Bilirubin 0.2 - 1.2 mg/dL 0.5  0.5  0.6   AST 10 - 35 U/L 13  15  13    ALT 9 - 46 U/L 8  10  8       Lipid Panel Lab Results  Component Value Date   CHOL 135 10/18/2021   HDL 54 10/18/2021   LDLCALC 66 10/18/2021   TRIG 66 10/18/2021   CHOLHDL 2.5 10/18/2021      Wt Readings from Last 3 Encounters:  01/13/22 187 lb (84.8 kg)  10/18/21 191 lb (86.6 kg)  09/06/21 195 lb (88.5 kg)     Other studies Reviewed: Additional studies/ records that were reviewed today include: Office notes, hospital records and testing.  ASSESSMENT AND PLAN:  1.  CAD -No ischemic symptoms, but activity and ambulation are limited by musculoskeletal issues. -He has numbness in his legs with exertion, but no pain and distal pulses are intact. -ECG has a gradually widening QRS over the last 3 years. -Discussed with Dr. Herbie Baltimore, get echo and follow-up on results - he is encouraged to continue activity as tolerated.  2.  Hyperlipidemia: - He is tolerating the Nexletol and the Zetia well, lipids are at goal  Current medicines are reviewed at length with the patient today.  The patient does not have concerns regarding medicines.  The following changes have been made:  no change  Labs/ tests ordered today include:   Orders Placed This Encounter  Procedures   EKG 12-Lead   ECHOCARDIOGRAM COMPLETE     Disposition:   FU with Bryan Lemma, MD  Signed, Theodore Demark, PA-C  01/13/2022 4:35 PM    [facility] Phone: [phone]; Fax: [phone]

## 2022-01-27 ENCOUNTER — Ambulatory Visit (INDEPENDENT_AMBULATORY_CARE_PROVIDER_SITE_OTHER): Payer: Medicare Other

## 2022-01-27 DIAGNOSIS — Z9861 Coronary angioplasty status: Secondary | ICD-10-CM | POA: Diagnosis not present

## 2022-01-27 DIAGNOSIS — I2510 Atherosclerotic heart disease of native coronary artery without angina pectoris: Secondary | ICD-10-CM | POA: Diagnosis not present

## 2022-01-27 LAB — ECHOCARDIOGRAM COMPLETE
Area-P 1/2: 2.26 cm2
S' Lateral: 4.35 cm

## 2022-02-10 ENCOUNTER — Ambulatory Visit (INDEPENDENT_AMBULATORY_CARE_PROVIDER_SITE_OTHER): Payer: Medicare Other | Admitting: Cardiology

## 2022-02-10 ENCOUNTER — Encounter: Payer: Self-pay | Admitting: Cardiology

## 2022-02-10 VITALS — BP 120/60 | HR 55 | Ht 70.0 in | Wt 186.2 lb

## 2022-02-10 DIAGNOSIS — I447 Left bundle-branch block, unspecified: Secondary | ICD-10-CM

## 2022-02-10 DIAGNOSIS — E1169 Type 2 diabetes mellitus with other specified complication: Secondary | ICD-10-CM

## 2022-02-10 DIAGNOSIS — I700 Atherosclerosis of aorta: Secondary | ICD-10-CM | POA: Diagnosis not present

## 2022-02-10 DIAGNOSIS — I872 Venous insufficiency (chronic) (peripheral): Secondary | ICD-10-CM | POA: Diagnosis not present

## 2022-02-10 DIAGNOSIS — E785 Hyperlipidemia, unspecified: Secondary | ICD-10-CM

## 2022-02-10 DIAGNOSIS — I10 Essential (primary) hypertension: Secondary | ICD-10-CM

## 2022-02-10 DIAGNOSIS — I2510 Atherosclerotic heart disease of native coronary artery without angina pectoris: Secondary | ICD-10-CM | POA: Diagnosis not present

## 2022-02-10 DIAGNOSIS — I420 Dilated cardiomyopathy: Secondary | ICD-10-CM

## 2022-02-10 DIAGNOSIS — Z9861 Coronary angioplasty status: Secondary | ICD-10-CM | POA: Diagnosis not present

## 2022-02-10 NOTE — Assessment & Plan Note (Signed)
Chronic varicose veins.  Recommend support stockings, foot elevation.  Thankfully, his symptoms are pretty well controlled right now.

## 2022-02-10 NOTE — Progress Notes (Signed)
Primary Care Provider: [name], MD Cardiologist: Glenetta Hew, MD Electrophysiologist: None  Clinic Note: Chief Complaint  Patient presents with   Follow-up    Echocardiogram results.   Cardiomyopathy    Echo ordered because of worsening LBBB.  No PND, orthopnea or edema.   Coronary Artery Disease    No active angina.    ===================================  ASSESSMENT/PLAN   Problem List Items Addressed This Visit       Cardiology Problems   CAD S/P percutaneous coronary angioplasty and PCI- DES mid LAD in the setting of non-STEMI (Chronic)    Over 10 years out from his LAD PCI.  It FFR borderline lesions elsewhere.  Has not really had any symptoms.  The only notable thing is that his left bundle branch block has gotten more prolonged.  Would like to get a baseline EKG on him that we can then use to follow-up going forward. He is not having active angina.  Remains on aspirin and combination Nexletol and ezetimibe (trying to convert to Nexletol if covered).  No active angina, despite having reduced EF, he indicated that he would prefer not to go forward with ischemic evaluation since he is not having symptoms.  He does not want to do any additional testing because he is not likely to want to go to the Cath Lab.  Were he to change his mind, as discussed with Dr. Gardiner Rhyme, probably the best option would be cardiac MRI stress test.  For now we will hold off.  Continue to suggest active exercise.      Essential hypertension (Chronic)    BP is actually well controlled today on modest dose lisinopril.  Seems to be tolerating it well.      Hyperlipidemia associated with type 2 diabetes mellitus (HCC) (Chronic)    Fasting lipid control on current dose of Zetia and Nexletol.  LDL down to 66.  Looks good.  Triglycerides and HDL also look well controlled.  No change to current meds for now.  Did not tolerate statin.  Remains on metformin for diabetes.  He wants just  to nonmedical therapy for now.  Not necessarily interested in SGLT2 inhibitors or GLP-1 agonist.  We can readdress in follow-up.      Dilated cardiomyopathy ([place]) - Primary    New finding reduced EF and global HK.  Not sure what happened or what caused this.  But his EF is now dramatically reduced.  Interestingly has not had any anginal symptoms no heart failure symptoms.  He seems fine sitting in the chair.  We discussed going back to exercising etc.  Now he has reduced EF and is only on ACE inhibitor which we may want to convert to ARB.  He is bradycardic and therefore not on beta-blocker. Relatively euvolemic so I do not know that we necessarily need a diuretic, but could consider low-dose Lasix.  We talked about evaluation of the cardiomyopathy.  My suspicion that this is really related to his bundle branch block given the septal dyssynchrony.  Ultimately, would want to exclude ischemia, however he is not having any symptoms, and is not all that keen on the idea of going to the Cath Lab and therefore would not necessarily be interested in noninvasive ischemic evaluation at this point.  As noted elsewhere, the best noninvasive option would be stress MRI that also give [place] an assessment of his EF and viability etc.      LBBB (left bundle branch block)    Longstanding,  chronic finding, but QRS complex is widening.  I am little bit concerned now that his EF is down as a result of left bundle much block.  He has not had any other untoward symptoms to suggest recurrent angina or CHF that is the reason.  Thankfully, despite having reduced EF he is not noticing any CHF symptoms of PND, orthopnea, or edema  I worry however left bundle risk block would affect stress testing.   I discussed with Dr. Nechama Guard best option given left bundle branch block and reduced EF as far as stress testing:  He recommended that we could probably consider Stress MRI as the best option if we chose to evaluate further, would  consider stress MRI which would also give [place] EF and wall motion assessment as well.      Aortic atherosclerosis ([place]) by CT scan 2017 (Chronic)    Control cardiovascular risk factors.  See individual factors.-BP controlled.  Lipids fairly well controlled.        Other   Venous stasis dermatitis of both lower extremities (Chronic)    Chronic varicose veins.  Recommend support stockings, foot elevation.  Thankfully, his symptoms are pretty well controlled right now.       ===================================  HPI:    David [name] is a 86 y.o. male with a PMH notable for CAD-PCI (non-STEMI June 2011-LAD PCI) with CRFs of HTN HLD as well as LBBB who presents today for 3-week follow-up after visit with Rosaria Ferries, PA-C to discuss results of echocardiogram.. He returns today at the request of [name], MD.  He is a former patient Dr. Aldona Bar. 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  He has been followed since June 2011 when he had a non-STEMI. CAD Non-STEMI June 2011-DES PCI LAD, moderate residual CAD. Relook cath February 2012-moderate lesion proximal LAD stent (FFR 0.82-medical management. LBBB => Newly reduced EF of 30 to 35% on echo CRFs: DM-2, HTN, HLD (intolerant to statins-on combination of Zetia and Nexletol because insurance company would not cover Nexlizet) Venous stasis dermatitis Memory loss (MMSE 10/18/2021 score 12/30)   I have not seen David [name] since February 2022.  He was doing well.  Was trying to avoid orthopedic/spine surgery and was receiving back injections.  Noted mild lower extremity swelling but nothing significant.  Doing well with no PND, orthopnea to go along with edema.  Mild varicose veins and venous stasis changes.  Started to have memory loss-forgetting names and forgetting replaced things.  Has never gotten lost.  Denied chest pain or pressure with rest or exertion.  No exertional dyspnea besides expected for age.  Usually  DOE with overexertion.  He remained relatively active not having with dyspnea with routine activity.  No irregular heartbeats palpitations.  No syncope or near-syncope.  No TIA or amaurosis fugax.  Does note some radiculopathy from his back pain.  Mild insomnia.  Ask about his varicose veins and venous stasis changes. No changes made.  Plan was annual follow-up.  David [name] was last seen on 01/13/2022 by Rosaria Ferries, PA-C: Noted that his memory is still getting worse.  Notes that he walks too much as legs get numb right greater than left.  He has to stop.  More radiculopathy and cramping.  A few months ago he got some heart fluttering issues but not recently.  He says his hearts been feeling well ever since he started close.  Denied any chest pain.  No syncope or near syncope.  No palpitations.  No PND, orthopnea edema. EKG showed widening QRS complexes with LBBB.  [place] over the last 3 years-2D echo ordered.  Recent Hospitalizations: None  Reviewed  CV studies:    The following studies were reviewed today: (if available, images/films reviewed: From Epic Chart or Care Everywhere) Echo 01/27/2022: LVEF 30 to 35% Moderately decreased function.  with global HK & Septal dyssynergy.  GR 1 DD.  Abnormal global wall strain of -12%.  Mild enlarged RV with normal function, but unable to assess PAP.  Trivial MR.  Normal tricuspid aortic valve.  Normal RAP. Unfortunately, most recent echo was back in 2011.  The formal read did not have an EF documented.  The images were very difficult to read but clearly was not normal at that time either.  Interval History:   David [name] returns here today somewhat incredulous as to why he is here.  He is still stable from a cardiac standpoint.  He is not having any heart failure symptoms.  No more exertional dyspnea than baseline.  Nothing more notable over the last year or 2 besides the fact that he is just following up little bit.  He has his chronic mild  lower extremity edema but that is nothing bothersome to him.  He is not currently on diuretic.  He says the swelling usually will go down and puts his feet up. He still says that since being on the Nexletol his unusual chest discomforts that seem to get much better.  No major issues for a while now.  His biggest issue is that he does not get good sleep-up maybe once or twice a night for nocturia.  But also his chronic back issues with radiculopathy.  Does get little exercise because his legs go numb when he walks.  Has not had any syncope or near syncope, no TIA or amaurosis fugax.  REVIEWED OF SYSTEMS   Pertinent ROS notable for: Less comments about back pain and radiculopathy.  Mild insomnia.  Mild swelling.  1-2 times a night nocturia with no hematuria or dysuria.  I have reviewed and (if needed) personally updated the patient's problem list, medications, allergies, past medical and surgical history, social and family history.   PAST MEDICAL HISTORY   Past Medical History:  Diagnosis Date   Arthritis    BPH (benign prostatic hyperplasia)    CAD S/P percutaneous coronary angioplasty 12/2009; 08/2010   a) 6/'22 - NSTEMI: PCI to LAD: Promus Element 2.5 mm x 15 mm DES; b) Cath for Angina: Prox LAD 60-70% pre-stent with FFR 0.82, 90% RVM -- Med Rx, EF 50-55%   Essential hypertension    Hyperlipidemia with target LDL less than 70    Statin intolerance   Non-ST elevation MI (NSTEMI) [facility]) June 2011   PCI to LAD   Pneumonia    DEC 2016  TX WITH ANTIBIOTIC   Type 2 diabetes mellitus without complications ([place])     PAST SURGICAL HISTORY   Past Surgical History:  Procedure Laterality Date   CARDIAC CATHETERIZATION  12/2009   Proximal LAD stenosis followed by a significant 80-90% distal stenosis   CARDIAC CATHETERIZATION  February 2012    90% ostial RV marginal branch; 60-70% proximal LAD with widely patent distal stent. FFR 0.82; medical therapy   CATARACT EXTRACTION W/ INTRAOCULAR LENS   IMPLANT, BILATERAL     CORONARY ANGIOPLASTY  12/2009   PTCA to proximal LAD; PCI with Promus Element DES stent  2.5 mm x 15 mm  distalLAD - .for non-ST elevation MI   DENTAL SURGERY     LUMBAR LAMINECTOMY/DECOMPRESSION MICRODISCECTOMY N/A 08/09/2015   Procedure: Lumbar Four-Five, Lumbar Five- Sacral One Decompressive Lumbar Laminectomy with Resection of Synovial Cyst;  Surgeon: Erline Levine, MD;  Location: [place] [facility];  Service: Neurosurgery;  Laterality: N/A;  L4 to S1 Decompressive Lumbar Laminectomy   LUMBAR LAMINECTOMY/DECOMPRESSION MICRODISCECTOMY Right 01/21/2016   Procedure: Redo Right Lumbar Five-Sacral One Laminectomy for synovial cyst;  Surgeon: Erline Levine, MD;  Location: [place] [facility]UR[facility] OR[facility];  Service: Neurosurgery;  Laterality: Right;  right   LUMBAR LAMINECTOMY/DECOMPRESSION MICRODISCECTOMY Right 10/08/2017   Procedure: Right Lumbar three-four Laminectomy for synovial cyst;  Surgeon: Erline Levine, MD;  Location: [place];  Service: Neurosurgery;  Laterality: Right;   NASAL FRACTURE SURGERY     TRANSTHORACIC ECHOCARDIOGRAM  June 2011   - (EF not reported) Moderately dilated LV; moderate hypokinesis of anteroseptal and anterior wall consistent with MI. Grade 1 diastolic dysfunction. Mild to moderately dilated LA   VASECTOMY      Immunization History  Administered Date(s) Administered   Influenza, High Dose Seasonal PF 06/01/2013, 04/18/2014, 05/23/2015, 05/20/2016, 04/10/2017, 05/13/2018, 05/10/2019, 07/10/2021   Influenza-Unspecified 05/13/2011   PFIZER(Purple Top)SARS-COV-2 Vaccination 08/17/2019, 09/19/2019   Pneumococcal Conjugate-13 04/18/2014   Pneumococcal Polysaccharide-23 07/21/2002, 05/23/2015   Td 07/21/2000    MEDICATIONS/ALLERGIES   Current Meds  Medication Sig   aspirin EC 81 MG tablet Take 1 tablet (81 mg total) by mouth daily.   budesonide-formoterol (SYMBICORT) 160-4.5 MCG/ACT inhaler Use  2 Inhalations (15 minutes apart)  2 x / day   CINNAMON PO Take 1,000 mg by mouth  daily.   Cyanocobalamin (VITAMIN B-12 SL) Place 2 tablets under the tongue once a week.   donepezil (ARICEPT) 10 MG tablet TAKE 1 TABLET BY MOUTH EVERYDAY AT BEDTIME   doxazosin (CARDURA) 4 MG tablet TAKE 1 TABLET AT BEDTIME FOR BP & PROSTATE   ezetimibe (ZETIA) 10 MG tablet TAKE 1 TABLET BY MOUTH EVERY DAY   finasteride (PROSCAR) 5 MG tablet Take 5 mg by mouth daily.   lisinopril (ZESTRIL) 5 MG tablet Take 1 tablet (5 mg total) by mouth daily. Patient must schedule appointment for future refills first attempt   Magnesium 500 MG CAPS Take 1 capsule by mouth daily.   metFORMIN (GLUCOPHAGE-XR) 500 MG 24 hr tablet TAKE 2 TABLETS 2 TIMES A DAY WITH MEALS FOR DIABETES   NEXLETOL 180 MG TABS TAKE 1 TABLET BY MOUTH EVERY DAY   Spacer/Aero-Holding Chambers (AEROCHAMBER PLUS) inhaler Use spacer with Inhaler   VITAMIN D PO Take 10,000 Units by mouth daily. Takes 10,000 units three times a week    Allergies  Allergen Reactions   Atorvastatin Other (See Comments)    MYALGIAS CRAMPING   Crestor [Rosuvastatin]     MYALGIAS CRAMPING   Statins     MYALGIAS CRAMPING   Tetanus Toxoids     UNSPECIFIED REACTION     SOCIAL HISTORY/FAMILY HISTORY   Reviewed in Epic:  Pertinent findings:  Social History   Tobacco Use   Smoking status: Every Day    Types: Pipe   Smokeless tobacco: Never   Tobacco comments:    smokes pipe qd  Vaping Use   Vaping Use: Never used  Substance Use Topics   Alcohol use: Yes    Alcohol/week: 7.0 standard drinks of alcohol    Types: 7 Standard drinks or equivalent per week    Comment: drinks 4 ounces of vodka daily   Drug  use: No   Social History   Social History Narrative   He is a married father of 2 with no grandchildren as of yet. He continued to smoke a pipe several times the course of day. He does have an occasional alcoholic beverage. While not involved a standard exercise routine, he is very active with walking and working in the yard, splitting wood and  doing aggressive yard work including Production designer, theatre/television/film.    OBJCTIVE -PE, EKG, labs   Wt Readings from Last 3 Encounters:  02/10/22 186 lb 3.2 oz (84.5 kg)  01/13/22 187 lb (84.8 kg)  10/18/21 191 lb (86.6 kg)    Physical Exam: BP 120/60   Pulse (!) 55   Ht '5\' 10"'$  (1.778 m)   Wt 186 lb 3.2 oz (84.5 kg)   SpO2 97%   BMI 26.72 kg/m  Physical Exam Constitutional:      General: He is not in acute distress.    Appearance: Normal appearance. He is normal weight. He is not ill-appearing (She is relatively healthy.  Well-nourished, well-groomed.) or toxic-appearing.  HENT:     Head: Normocephalic and atraumatic.  Neck:     Vascular: No carotid bruit.  Cardiovascular:     Rate and Rhythm: Normal rate and regular rhythm. Occasional Extrasystoles are present.    Chest Wall: PMI is not displaced.     Pulses: Decreased pulses (Mildly decreased due to body habitus).     Heart sounds: S1 normal and S2 normal. Heart sounds are distant. No murmur heard.    No friction rub. No gallop. No S4 sounds.  Pulmonary:     Effort: Pulmonary effort is normal. No respiratory distress.     Breath sounds: Normal breath sounds. No wheezing, rhonchi or rales.  Chest:     Chest wall: No tenderness.  Abdominal:     General: Abdomen is flat. Bowel sounds are normal. There is no distension.     Palpations: Abdomen is soft. There is no mass.     Tenderness: There is no abdominal tenderness. There is no rebound.  Musculoskeletal:        General: Swelling (Trivial ankle swelling bilaterally.) present.     Cervical back: Normal range of motion and neck supple.  Skin:    General: Skin is dry.     Coloration: Skin is not jaundiced.     Findings: Bruising present. No erythema.     Comments: Mild diffuse skin tags etc.  He has mild diffuse bruising.  There is stasis changes bilaterally with varicose veins/spider veins.  Neurological:     General: No focal deficit present.     Mental Status: He  is alert and oriented to person, place, and time.     Cranial Nerves: No cranial nerve deficit.     Gait: Gait abnormal (Slow, lumbering, antalgic).     Comments: Definite memory loss.  Psychiatric:        Mood and Affect: Mood normal.        Behavior: Behavior normal.        Thought Content: Thought content normal.        Judgment: Judgment normal.     Comments: Has pretty good long-term memory recall but not much short-term recall.     Adult ECG Report Not checked  Recent Labs: Reviewed Lab Results  Component Value Date   CHOL 135 10/18/2021   HDL 54 10/18/2021   LDLCALC 66 10/18/2021   TRIG 66 10/18/2021  CHOLHDL 2.5 10/18/2021   Lab Results  Component Value Date   CREATININE 0.86 10/18/2021   BUN 13 10/18/2021   NA 140 10/18/2021   K 4.4 10/18/2021   CL 104 10/18/2021   CO2 28 10/18/2021      Latest Ref Rng & Units 10/18/2021   11:39 AM 02/28/2021    3:01 PM 10/18/2020   12:05 PM  CBC  WBC 3.8 - 10.8 Thousand/uL 6.8  8.8  7.3   Hemoglobin 13.2 - 17.1 g/dL 14.4  14.8  14.3   Hematocrit 38.5 - 50.0 % 43.2  44.7  43.3   Platelets 140 - 400 Thousand/uL 244  269  265     Lab Results  Component Value Date   HGBA1C 5.7 (H) 10/18/2021   Lab Results  Component Value Date   TSH 1.71 02/28/2021    ================================================== I spent a total of 26 minutes with the patient spent in direct patient consultation.  Additional time spent with chart review  / charting (studies, outside notes, etc): 37 min Total Time: 63 min  Current medicines are reviewed at length with the patient today.  (+/- concerns) N/A  Notice: This dictation was prepared with Dragon dictation along with smart phrase technology. Any transcriptional errors that result from this process are unintentional and may not be corrected upon review.  Studies Ordered:   No orders of the defined types were placed in this encounter.  No orders of the defined types were placed in this  encounter.   Patient Instructions / Medication Changes & Studies & Tests Ordered   Patient Instructions  Medication Instructions:   No changes  *If you need a refill on your cardiac medications before your next appointment, please call your pharmacy*   Lab Work:  Not needed If you have labs (blood work) drawn today and your tests are completely normal, you will receive your results only by: MyChart Message (if you have MyChart) OR A paper copy in the mail If you have any lab test that is abnormal or we need to change your treatment, we will call you to review the results.   Testing/Procedures: Not needed   Follow-Up: At [facility], you and your health needs are our priority.  As part of our continuing mission to provide you with exceptional heart care, we have created designated Provider Care Teams.  These Care Teams include your primary Cardiologist (physician) and Advanced Practice Providers (APPs -  Physician Assistants and Nurse Practitioners) who all work together to provide you with the care you need, when you need it.  We recommend signing up for the patient portal called "MyChart".  Sign up information is provided on this After Visit Summary.  MyChart is used to connect with patients for Virtual Visits (Telemedicine).  Patients are able to view lab/test results, encounter notes, upcoming appointments, etc.  Non-urgent messages can be sent to your provider as well.   To learn more about what you can do with MyChart, go to [email or web address].    Your next appointment:   6 month(s)  Jan 2024  The format for your next appointment:   In Person  Provider:   Glenetta Hew, MD       Important Information About Sugar           Glenetta Hew, M.D., M.S. Interventional Cardiologist   Pager # [phone] Phone # [phone] [address] [place], [place] [address]   Thank you for choosing Heartcare at [facility]!!

## 2022-02-10 NOTE — Assessment & Plan Note (Addendum)
New finding reduced EF and global HK.  Not sure what happened or what caused this.  But his EF is now dramatically reduced.  Interestingly has not had any anginal symptoms no heart failure symptoms.  He seems fine sitting in the chair.  We discussed going back to exercising etc.  Now he has reduced EF and is only on ACE inhibitor which we may want to convert to ARB.  He is bradycardic and therefore not on beta-blocker. Relatively euvolemic so I do not know that we necessarily need a diuretic, but could consider low-dose Lasix.  We talked about evaluation of the cardiomyopathy.  My suspicion that this is really related to his bundle branch block given the septal dyssynchrony.  Ultimately, would want to exclude ischemia, however he is not having any symptoms, and is not all that keen on the idea of going to the Cath Lab and therefore would not necessarily be interested in noninvasive ischemic evaluation at this point.  As noted elsewhere, the best noninvasive option would be stress MRI that also give [place] an assessment of his EF and viability etc.

## 2022-02-10 NOTE — Assessment & Plan Note (Signed)
Fasting lipid control on current dose of Zetia and Nexletol.  LDL down to 66.  Looks good.  Triglycerides and HDL also look well controlled.  No change to current meds for now.  Did not tolerate statin.  Remains on metformin for diabetes.  He wants just to nonmedical therapy for now.  Not necessarily interested in SGLT2 inhibitors or GLP-1 agonist.  We can readdress in follow-up.

## 2022-02-10 NOTE — Assessment & Plan Note (Signed)
Longstanding, chronic finding, but QRS complex is widening.  I am little bit concerned now that his EF is down as a result of left bundle much block.  He has not had any other untoward symptoms to suggest recurrent angina or CHF that is the reason.  Thankfully, despite having reduced EF he is not noticing any CHF symptoms of PND, orthopnea, or edema  I worry however left bundle risk block would affect stress testing.   I discussed with Dr. Nechama Guard best option given left bundle branch block and reduced EF as far as stress testing:   He recommended that we could probably consider Stress MRI as the best option if we chose to evaluate further, would consider stress MRI which would also give [place] EF and wall motion assessment as well.

## 2022-02-10 NOTE — Patient Instructions (Signed)
Medication Instructions:   No changes  *If you need a refill on your cardiac medications before your next appointment, please call your pharmacy*   Lab Work:  Not needed If you have labs (blood work) drawn today and your tests are completely normal, you will receive your results only by: [place] (if you have MyChart) OR A paper copy in the mail If you have any lab test that is abnormal or we need to change your treatment, we will call you to review the results.   Testing/Procedures: Not needed   Follow-Up: At [facility], you and your health needs are our priority.  As part of our continuing mission to provide you with exceptional heart care, we have created designated Provider Care Teams.  These Care Teams include your primary Cardiologist (physician) and Advanced Practice Providers (APPs -  Physician Assistants and Nurse Practitioners) who all work together to provide you with the care you need, when you need it.  We recommend signing up for the patient portal called "MyChart".  Sign up information is provided on this After Visit Summary.  MyChart is used to connect with patients for Virtual Visits (Telemedicine).  Patients are able to view lab/test results, encounter notes, upcoming appointments, etc.  Non-urgent messages can be sent to your provider as well.   To learn more about what you can do with MyChart, go to [email or web address].    Your next appointment:   6 month(s)  Jan 2024  The format for your next appointment:   In Person  Provider:   Glenetta Hew, MD       Important Information About Sugar

## 2022-02-10 NOTE — Assessment & Plan Note (Signed)
Control cardiovascular risk factors.  See individual factors.-BP controlled.  Lipids fairly well controlled.

## 2022-02-10 NOTE — Assessment & Plan Note (Signed)
BP is actually well controlled today on modest dose lisinopril.  Seems to be tolerating it well.

## 2022-02-10 NOTE — Assessment & Plan Note (Signed)
Over 10 years out from his LAD PCI.  It FFR borderline lesions elsewhere.  Has not really had any symptoms.  The only notable thing is that his left bundle branch block has gotten more prolonged.  Would like to get a baseline EKG on him that we can then use to follow-up going forward. He is not having active angina.  Remains on aspirin and combination Nexletol and ezetimibe (trying to convert to Nexletol if covered).  No active angina, despite having reduced EF, he indicated that he would prefer not to go forward with ischemic evaluation since he is not having symptoms.  He does not want to do any additional testing because he is not likely to want to go to the Cath Lab.  Were he to change his mind, as discussed with Dr. Gardiner Rhyme, probably the best option would be cardiac MRI stress test.  For now we will hold off.  Continue to suggest active exercise.

## 2022-02-26 DIAGNOSIS — M1711 Unilateral primary osteoarthritis, right knee: Secondary | ICD-10-CM | POA: Diagnosis not present

## 2022-03-02 ENCOUNTER — Encounter: Payer: Self-pay | Admitting: Internal Medicine

## 2022-03-02 NOTE — Patient Instructions (Signed)

## 2022-03-02 NOTE — Progress Notes (Signed)
Annual  Screening/Preventative Visit  & Comprehensive Evaluation & Examination  Future Appointments  Date Time Provider [place]  02/28/2021  3:00 PM [name], MD [facility] None  10/18/2021 11:00 AM Magda Bernheim, NP [facility] None  03/03/2022  3:00 PM [name], MD [facility] None            This very nice 86 y.o. MWM presents for a Screening /Preventative Visit & comprehensive evaluation and management of multiple medical co-morbidities.  Patient has been followed for HTN, HLD, T2_NIDDM, Vit B12 deficiency  and Vitamin D Deficiency. Last M/C Wellness in March.        Currently patient's wife is undergoing treatments for a Brain tumor bx'd as Lymphoma.         Patient has had an approximate 5 year prodrome of progressive cognitive decline. In Feb after neuro psych testing , Dr Leta Baptist confirmed a Dx of Dementia. Patient is still driving for app'ts and denies having been "lost".  Reports that he manages his ADL's and he & wife alternate cooking their meals.  In  2021 , patient was noted to have a low Vit B12 = 275 & patient was recommended  to supplement Vit B12 SL . Last B12 level in Dec 2022 was up to 558       HTN predates since 2000. Patient's BP has been controlled at home.  Today's BP is at goal - 118/62 .  In 2011,   patient had a NSTEMI & PCA and currently is followed by Dr Ellyn Hack.  Patient denies any cardiac symptoms as chest pain, palpitations, shortness of breath, dizziness or ankle swelling.       Patient' is very intolerant of Statins and his  hyperlipidemia is controlled with diet and Nexletol & Zetia. Patient denies myalgias or other medication SE's. Last lipids were   Lab Results  Component Value Date   CHOL 134 10/18/2020   HDL 55 10/18/2020   LDLCALC 64 10/18/2020   TRIG 70 10/18/2020   CHOLHDL 2.4 10/18/2020         Patient has  T2_NIDDM (2006) w/CKD2 and patient denies reactive hypoglycemic symptoms, visual blurring, diabetic polys  or paresthesias. Last A1c  on Metformin was near goal:   Lab Results  Component Value Date   HGBA1C 5.7 (H) 10/18/2020          Finally, patient has history of Vitamin D Deficiency ("22" /2008) and last vitamin D was at goal:   Lab Results  Component Value Date   VD25OH 71 07/17/2020    Outpatient Encounter Medications as of 03/03/2022  Medication Sig   aspirin EC 81 MG tablet Take 1 tablet daily.   SYMBICORT 160-4.5  Use  2 Inhalations   2 x / day   CINNAMON  1,000 mg  Take  daily.   VITAMIN B-12 SL Place 2 tablets under the tongue once a week.   donepezil 10 MG tablet TAKE 1 TABLET BY MOUTH EVERYDAY AT BEDTIME   doxazosin  4 MG tablet TAKE 1 TABLET AT BEDTIME    ezetimibe 10 MG tablet TAKE 1 TABLET  EVERY DAY   finasteride  5 MG tablet Take 5 mg by mouth daily.   Lisinopril 5 MG tablet Take 1 tablet daily   Magnesium 500 MG CAPS Take 1 capsule daily.   metFORMIN-XR 500 MG t TAKE 2 TABS 2 TIMES A DAY WITH MEALS    NEXLETOL 180 MG TABS TAKE 1 TABLET BY MOUTH EVERY  DAY   VITAMIN D  10,000 units  Takes three times a week     Allergies  Allergen Reactions   Atorvastatin Other (See Comments)    MYALGIAS CRAMPING   Crestor [Rosuvastatin]     MYALGIAS CRAMPING   Statins     MYALGIAS CRAMPING   Tetanus Toxoids     Past Medical History:  Diagnosis Date   Arthritis    BPH (benign prostatic hyperplasia)    CAD S/P percutaneous coronary angioplasty 12/2009; 08/2010   a) 6/'22 - NSTEMI: PCI to LAD: Promus Element 2.5 mm x 15 mm DES; b) Cath for Angina: Prox LAD 60-70% pre-stent with FFR 0.82, 90% RVM -- Med Rx, EF 50-55%   Essential hypertension    Hyperlipidemia with target LDL less than 70    Statin intolerance   Non-ST elevation MI (NSTEMI) ([place]) June 2011   PCI to LAD   Pneumonia    DEC 2016  [place] WITH ANTIBIOTIC   Type 2 diabetes mellitus without complications [facility])      Health Maintenance  Topic Date Due   Zoster Vaccines- Shingrix (1 of 2) Never done    TETANUS/TDAP  07/21/2010   COVID-19 Vaccine (3 - Pfizer risk series) 10/17/2019   FOOT EXAM  01/02/2021   INFLUENZA VACCINE  02/18/2021   HEMOGLOBIN A1C  04/19/2021   OPHTHALMOLOGY EXAM  12/06/2021   PNA vac Low Risk Adult  Completed   HPV VACCINES  Aged Out    Immunization History  Administered Date(s) Administered   Influenza, High Dose  04/10/2017, 05/13/2018, 05/10/2019   Influenza 05/13/2011   PFIZER   SARS-COV-2 Vacc 08/17/2019, 09/19/2019   Pneumococcal -13 04/18/2014   Pneumococcal -23 07/21/2002, 05/23/2015   Td 07/21/2000   Last Colon - 06/23/2017 - Dr Oletta Lamas - recc no F/U due to age  Past Surgical History:  Procedure Laterality Date   CARDIAC CATHETERIZATION  12/2009   Proximal LAD stenosis followed by a significant 80-90% distal stenosis   CARDIAC CATHETERIZATION  February 2012    90% ostial RV marginal branch; 60-70% proximal LAD with widely patent distal stent. FFR 0.82; medical therapy   CATARACT EXTRACTION W/ INTRAOCULAR LENS  IMPLANT, BILATERAL     CORONARY ANGIOPLASTY  12/2009   PTCA to proximal LAD; PCI with Promus Element DES stent  2.5 mm x 15 mm  distalLAD - .for non-ST elevation MI   DENTAL SURGERY     LUMBAR LAMINECTOMY/DECOMPRESSION MICRODISCECTOMY N/A 08/09/2015   Procedure: Lumbar Four-Five, Lumbar Five- Sacral One Decompressive Lumbar Laminectomy with Resection of Synovial Cyst;  Surgeon: Erline Levine, MD;  Location: [facility];  Service: Neurosurgery;  Laterality: N/A;  L4 to S1 Decompressive Lumbar Laminectomy   LUMBAR LAMINECTOMY/DECOMPRESSION MICRODISCECTOMY Right 01/21/2016   Procedure: Redo Right Lumbar Five-Sacral One Laminectomy for synovial cyst;  Surgeon: Erline Levine, MD;  Location: [facility];  Service: Neurosurgery;  Laterality: Right;  right   LUMBAR LAMINECTOMY/DECOMPRESSION MICRODISCECTOMY Right 10/08/2017   Procedure: Right Lumbar three-four Laminectomy for synovial cyst;  Surgeon: Erline Levine, MD;  Location: [facility];  Service:  Neurosurgery;  Laterality: Right;   NASAL FRACTURE SURGERY     TRANSTHORACIC ECHOCARDIOGRAM  June 2011   - (EF not reported) Moderately dilated LV; moderate hypokinesis of anteroseptal and anterior wall consistent with MI. Grade 1 diastolic dysfunction. Mild to moderately dilated LA   VASECTOMY       Family History  Problem Relation Age of Onset   Cancer Father  lung   Stroke Father    Cancer Sister        breast   Cancer Brother        lung   Cancer Son        history of prostate    Social History   Socioeconomic History   Marital status: Married    Spouse name: Kermit Balo   Number of children: 1 son & 1 daughter  Occupational History   Not on file  Tobacco Use   Smoking status: Every Day    Types: Pipe   Smokeless tobacco: Never   Tobacco comments:    smokes pipe qd  Vaping Use   Vaping Use: Never used  Substance and Sexual Activity   Alcohol use: Yes    Alcohol/week: 7.0 standard drinks    Types: 7 Standard drinks or equivalent per week    Comment: drinks 4 ounces of vodka daily   Drug use: No   Sexual activity: Not on file    ROS Constitutional: Denies fever, chills, weight loss/gain, headaches, insomnia,  night sweats or change in appetite. Does c/o fatigue. Eyes: Denies redness, blurred vision, diplopia, discharge, itchy or watery eyes.  ENT: Denies discharge, congestion, post nasal drip, epistaxis, sore throat, earache, hearing loss, dental pain, Tinnitus, Vertigo, Sinus pain or snoring.  Cardio: Denies chest pain, palpitations, irregular heartbeat, syncope, dyspnea, diaphoresis, orthopnea, PND, claudication or edema Respiratory: denies cough, dyspnea, DOE, pleurisy, hoarseness, laryngitis or wheezing.  Gastrointestinal: Denies dysphagia, heartburn, reflux, water brash, pain, cramps, nausea, vomiting, bloating, diarrhea, constipation, hematemesis, melena, hematochezia, jaundice or hemorrhoids Genitourinary: Denies dysuria, frequency, discharge, hematuria or  flank pain. Has urgency, nocturia x 2-3 & occasional hesitancy. Musculoskeletal: Denies arthralgia, myalgia, stiffness, Jt. Swelling, pain, limp or strain/sprain. Denies Falls. Skin: Denies puritis, rash, hives, warts, acne, eczema or change in skin lesion Neuro: No weakness, tremor, incoordination, spasms, paresthesia or pain Psychiatric: Admits memory loss . Denies Depression. Endocrine: Denies change in weight, skin, hair change, nocturia, and paresthesia, diabetic polys, visual blurring or hyper / hypo glycemic episodes.  Heme/Lymph: No excessive bleeding, bruising or enlarged lymph nodes.   Physical Exam  BP 118/62   Pulse 76   Temp 97.7 F (36.5 C)   Resp 16   Ht '5\' 10"'$  (1.778 m)   Wt 185 lb 12.8 oz (84.3 kg)   SpO2 94%   BMI 26.66 kg/m   General Appearance: Well nourished and well groomed and in no apparent distress.  Eyes: PERRLA, EOMs, conjunctiva no swelling or erythema. Fundi not well visualized. Sinuses: No frontal/maxillary tenderness ENT/Mouth: EACs patent / TMs  nl. Nares clear without erythema, swelling, mucoid exudates. Oral hygiene is good. No erythema, swelling, or exudate. Tongue normal, non-obstructing. Tonsils not swollen or erythematous. Hearing normal.  Neck: Supple, thyroid not palpable. No bruits, nodes or JVD. Respiratory: Respiratory effort normal.  BS equal and clear bilateral without rales, rhonci, wheezing or stridor. Cardio: Heart sounds are normal with regular rate and rhythm and no murmurs, rubs or gallops. Peripheral pulses are normal and equal bilaterally without edema. No aortic or femoral bruits. Chest: symmetric with normal excursions and percussion.  Abdomen: Soft, with Nl bowel sounds. Nontender, no guarding, rebound, hernias, masses, or organomegaly.  Lymphatics: Non tender without lymphadenopathy.  Musculoskeletal: Full ROM all peripheral extremities, joint stability, 5/5 strength, and normal gait. Skin: Warm and dry without rashes,  lesions, cyanosis, clubbing or  ecchymosis.  Neuro: Cranial nerves intact, reflexes equal bilaterally. (+) Snout  & palmomental reflex  Normal muscle tone, no cerebellar symptoms. Sensation intact.  Speech slow, but fluent as he contemplates.  Pysch: Alert and oriented x 3 with normal affect.   Insight and judgment seem appropriate but limited. appropriate.   Assessment and Plan  1. Essential hypertension  - EKG 12-Lead - [place], RETROPERITNL ABD,  LTD - Urinalysis, Routine w reflex microscopic - Microalbumin / creatinine urine ratio - CBC with Differential/Platelet - COMPLETE METABOLIC PANEL WITH GFR - Magnesium - TSH  2. Hyperlipidemia associated with type 2 diabetes mellitus ([place])  - EKG 12-Lead - [place], RETROPERITNL ABD,  LTD - Lipid panel - TSH  3. Type 2 diabetes mellitus with stage 2 chronic kidney  disease, without long-term current use of insulin (HCC)  - EKG 12-Lead - [place], RETROPERITNL ABD,  LTD - HM DIABETES FOOT EXAM - PR LOW EXTEMITY NEUR EXAM DOCUM - Insulin, random  4. Vitamin D deficiency  - VITAMIN D 25 Hydroxy   5. CAD S/P percutaneous coronary angioplasty and PCI- DES mid LAD in the setting of non-STEMI  - EKG 12-Lead - Lipid panel  6. Vascular dementia with other behavioral disturbance  (HCC)  - Vitamin B12 - Lipid panel - TSH  7. Aortic atherosclerosis ([place]) by CT scan 2017  - EKG 12-Lead - [place], RETROPERITNL ABD,  LTD - Lipid panel  8. B12 deficiency  - Vitamin B12  9. Screening for colorectal cancer  - POC Hemoccult Bld/Stl   10. BPH with obstruction/lower urinary tract symptoms  - PSA  11. Prostate cancer screening  - PSA  12. Screening for heart disease  - EKG 12-Lead  13. Family history of cerebrovascular event  - EKG 12-Lead - [place], RETROPERITNL ABD,  LTD  14. Smoker  - [place], RETROPERITNL ABD,  LTD  15. Screening for AAA (aortic abdominal aneurysm)  - [place], RETROPERITNL ABD,  LTD  16. Medication management  -  Urinalysis, Routine w reflex microscopic - Microalbumin / creatinine urine ratio - Vitamin B12 - CBC with Differential/Platelet - COMPLETE METABOLIC PANEL WITH GFR - Magnesium - Lipid panel - TSH - Hemoglobin A1c - Insulin, random - VITAMIN D 25 Hydroxy        Patient was counseled in prudent diet, weight control to achieve/maintain BMI less than 25, BP monitoring, regular exercise and medications as discussed.  Discussed med effects and SE's. Routine screening labs and tests as requested with regular follow-up as recommended. Over 40 minutes of exam, counseling, chart review and high complex critical decision making was performed   Kirtland Bouchard, MD

## 2022-03-03 ENCOUNTER — Encounter: Payer: Self-pay | Admitting: Internal Medicine

## 2022-03-03 ENCOUNTER — Ambulatory Visit (INDEPENDENT_AMBULATORY_CARE_PROVIDER_SITE_OTHER): Payer: Medicare Other | Admitting: Internal Medicine

## 2022-03-03 VITALS — BP 118/62 | HR 76 | Temp 97.7°F | Resp 16 | Ht 70.0 in | Wt 185.8 lb

## 2022-03-03 DIAGNOSIS — I10 Essential (primary) hypertension: Secondary | ICD-10-CM | POA: Diagnosis not present

## 2022-03-03 DIAGNOSIS — E559 Vitamin D deficiency, unspecified: Secondary | ICD-10-CM

## 2022-03-03 DIAGNOSIS — N138 Other obstructive and reflux uropathy: Secondary | ICD-10-CM | POA: Diagnosis not present

## 2022-03-03 DIAGNOSIS — Z8249 Family history of ischemic heart disease and other diseases of the circulatory system: Secondary | ICD-10-CM

## 2022-03-03 DIAGNOSIS — F01518 Vascular dementia, unspecified severity, with other behavioral disturbance: Secondary | ICD-10-CM

## 2022-03-03 DIAGNOSIS — E538 Deficiency of other specified B group vitamins: Secondary | ICD-10-CM

## 2022-03-03 DIAGNOSIS — N401 Enlarged prostate with lower urinary tract symptoms: Secondary | ICD-10-CM | POA: Diagnosis not present

## 2022-03-03 DIAGNOSIS — Z136 Encounter for screening for cardiovascular disorders: Secondary | ICD-10-CM | POA: Diagnosis not present

## 2022-03-03 DIAGNOSIS — I2510 Atherosclerotic heart disease of native coronary artery without angina pectoris: Secondary | ICD-10-CM

## 2022-03-03 DIAGNOSIS — Z1211 Encounter for screening for malignant neoplasm of colon: Secondary | ICD-10-CM

## 2022-03-03 DIAGNOSIS — E785 Hyperlipidemia, unspecified: Secondary | ICD-10-CM | POA: Diagnosis not present

## 2022-03-03 DIAGNOSIS — Z125 Encounter for screening for malignant neoplasm of prostate: Secondary | ICD-10-CM | POA: Diagnosis not present

## 2022-03-03 DIAGNOSIS — F17200 Nicotine dependence, unspecified, uncomplicated: Secondary | ICD-10-CM | POA: Diagnosis not present

## 2022-03-03 DIAGNOSIS — Z79899 Other long term (current) drug therapy: Secondary | ICD-10-CM

## 2022-03-03 DIAGNOSIS — E1169 Type 2 diabetes mellitus with other specified complication: Secondary | ICD-10-CM | POA: Diagnosis not present

## 2022-03-03 DIAGNOSIS — E1122 Type 2 diabetes mellitus with diabetic chronic kidney disease: Secondary | ICD-10-CM

## 2022-03-03 DIAGNOSIS — I700 Atherosclerosis of aorta: Secondary | ICD-10-CM | POA: Diagnosis not present

## 2022-03-03 DIAGNOSIS — Z9861 Coronary angioplasty status: Secondary | ICD-10-CM | POA: Diagnosis not present

## 2022-03-04 LAB — COMPLETE METABOLIC PANEL WITH GFR
AG Ratio: 1.7 (calc) (ref 1.0–2.5)
ALT: 8 U/L — ABNORMAL LOW (ref 9–46)
AST: 13 U/L (ref 10–35)
Albumin: 4 g/dL (ref 3.6–5.1)
Alkaline phosphatase (APISO): 39 U/L (ref 35–144)
BUN: 17 mg/dL (ref 7–25)
CO2: 25 mmol/L (ref 20–32)
Calcium: 9.5 mg/dL (ref 8.6–10.3)
Chloride: 101 mmol/L (ref 98–110)
Creat: 0.89 mg/dL (ref 0.70–1.22)
Globulin: 2.3 g/dL (calc) (ref 1.9–3.7)
Glucose, Bld: 114 mg/dL — ABNORMAL HIGH (ref 65–99)
Potassium: 4.3 mmol/L (ref 3.5–5.3)
Sodium: 136 mmol/L (ref 135–146)
Total Bilirubin: 0.5 mg/dL (ref 0.2–1.2)
Total Protein: 6.3 g/dL (ref 6.1–8.1)
eGFR: 85 mL/min/{1.73_m2} (ref >=60)

## 2022-03-04 LAB — HEMOGLOBIN A1C
Hgb A1c MFr Bld: 5.5 % of total Hgb (ref <5.7)
Mean Plasma Glucose: 111 mg/dL
eAG (mmol/L): 6.2 mmol/L

## 2022-03-04 LAB — URINALYSIS, ROUTINE W REFLEX MICROSCOPIC
Bilirubin Urine: NEGATIVE
Glucose, UA: NEGATIVE
Hgb urine dipstick: NEGATIVE
Ketones, ur: NEGATIVE
Leukocytes,Ua: NEGATIVE
Nitrite: NEGATIVE
Protein, ur: NEGATIVE
Specific Gravity, Urine: 1.012 (ref 1.001–1.035)
pH: 5.5 (ref 5.0–8.0)

## 2022-03-04 LAB — CBC WITH DIFFERENTIAL/PLATELET
Absolute Monocytes: 979 cells/uL — ABNORMAL HIGH (ref 200–950)
Basophils Absolute: 58 cells/uL (ref 0–200)
Basophils Relative: 0.7 %
Eosinophils Absolute: 291 cells/uL (ref 15–500)
Eosinophils Relative: 3.5 %
HCT: 44.2 % (ref 38.5–50.0)
Hemoglobin: 15.2 g/dL (ref 13.2–17.1)
Lymphs Abs: 1320 cells/uL (ref 850–3900)
MCH: 32.1 pg (ref 27.0–33.0)
MCHC: 34.4 g/dL (ref 32.0–36.0)
MCV: 93.4 fL (ref 80.0–100.0)
MPV: 10.9 fL (ref 7.5–12.5)
Monocytes Relative: 11.8 %
Neutro Abs: 5652 cells/uL (ref 1500–7800)
Neutrophils Relative %: 68.1 %
Platelets: 267 10*3/uL (ref 140–400)
RBC: 4.73 10*6/uL (ref 4.20–5.80)
RDW: 12.5 % (ref 11.0–15.0)
Total Lymphocyte: 15.9 %
WBC: 8.3 10*3/uL (ref 3.8–10.8)

## 2022-03-04 LAB — LIPID PANEL
Cholesterol: 116 mg/dL (ref <200)
HDL: 51 mg/dL (ref >=40)
LDL Cholesterol (Calc): 50 mg/dL (calc)
Non-HDL Cholesterol (Calc): 65 mg/dL (calc) (ref <130)
Total CHOL/HDL Ratio: 2.3 (calc) (ref <5.0)
Triglycerides: 71 mg/dL (ref <150)

## 2022-03-04 LAB — VITAMIN B12: Vitamin B-12: 235 pg/mL (ref 200–1100)

## 2022-03-04 LAB — VITAMIN D 25 HYDROXY (VIT D DEFICIENCY, FRACTURES): Vit D, 25-Hydroxy: 42 ng/mL (ref 30–100)

## 2022-03-04 LAB — MICROALBUMIN / CREATININE URINE RATIO
Creatinine, Urine: 65 mg/dL (ref 20–320)
Microalb Creat Ratio: 3 ug/mg{creat}
Microalb, Ur: 0.2 mg/dL

## 2022-03-04 LAB — TSH: TSH: 1.77 mIU/L (ref 0.40–4.50)

## 2022-03-04 LAB — PSA: PSA: 0.72 ng/mL (ref <=4.00)

## 2022-03-04 LAB — INSULIN, RANDOM: Insulin: 23 u[IU]/mL — ABNORMAL HIGH

## 2022-03-04 LAB — MAGNESIUM: Magnesium: 1.9 mg/dL (ref 1.5–2.5)

## 2022-03-04 NOTE — Progress Notes (Signed)
<><><><><><><><><><><><><><><><><><><><><><><><><><><><><><><><><> <><><><><><><><><><><><><><><><><><><><><><><><><><><><><><><><><> -   Test results slightly outside the reference range are not unusual. If there is anything important, I will review this with you,  otherwise it is considered normal test values.  If you have further questions,  please do not hesitate to contact me at the office or via My Chart.  <><><><><><><><><><><><><><><><><><><><><><><><><><><><><><><><><> <><><><><><><><><><><><><><><><><><><><><><><><><><><><><><><><><>  -   -  Vitamin B12   =    235       is    Very Low  (Ideal or Goal Vit B12 is between 450 - 1,100)   Low Vit B12 may be associated with Anemia , Fatigue,   Peripheral Neuropathy, Dementia, "Brain Fog", & Depression  - Recommend take a sub-lingual form of Vitamin B12 tablet   1,000 to 5,000 mcg tab that you dissolve under your tongue /  EVERY Daily   - Can get [place] [employer] - best price at [employer] or on [employer] <><><><><><><><><><><><><><><><><><><><><><><><><><><><><><><><><> <><><><><><><><><><><><><><><><><><><><><><><><><><><><><><><><><>  -  PSA - Very Low  - Great  ! <><><><><><><><><><><><><><><><><><><><><><><><><><><><><><><><><> <><><><><><><><><><><><><><><><><><><><><><><><><><><><><><><><><>  - Total Chol = 116   &  LDL  Chol = 50   -    Both     Excellent   - Very low risk for Heart Attack  / Stroke <><><><><><><><><><><><><><><><><><><><><><><><><><><><><><><><><> <><><><><><><><><><><><><><><><><><><><><><><><><><><><><><><><><>  - A1c back to Normal  /  Non  Diabetic Range  - Wondrerful  !  !  ! <><><><><><><><><><><><><><><><><><><><><><><><><><><><><><><><><> <><><><><><><><><><><><><><><><><><><><><><><><><><><><><><><><><>  - Vitamin D has dropped down too low   - Vitamin D goal is between 70-100.   - Please Increase to Vitamin D 10,000 units  EVERY   day   - It is very important as a natural anti-inflammatory and  helping the  immune system protect against viral infections, like the Covid-19    helping hair, skin, and nails, as well as reducing stroke and heart attack risk.   - It helps your bones and helps with mood.  - It also decreases numerous cancer risks so please                                                                            take it as directed.   - Low Vit D is associated with a 200-300% higher risk for CANCER   and 200-300% higher risk for HEART   ATTACK  &  STROKE.    - It is also associated with higher death rate at younger ages,   autoimmune diseases like Rheumatoid arthritis, Lupus, Multiple Sclerosis.     - Also many other serious conditions, like depression, Alzheimer's  Dementia, infertility, muscle aches, fatigue, fibromyalgia   <><><><><><><><><><><><><><><><><><><><><><><><><><><><><><><><><> <><><><><><><><><><><><><><><><><><><><><><><><><><><><><><><><><>  -All Else - CBC - Kidneys - Electrolytes - Liver - Magnesium & Thyroid    - all  Normal / OK <><><><><><><><><><><><><><><><><><><><><><><><><><><><><><><><><> <><><><><><><><><><><><><><><><><><><><><><><><><><><><><><><><><>

## 2022-03-06 DIAGNOSIS — H40013 Open angle with borderline findings, low risk, bilateral: Secondary | ICD-10-CM | POA: Diagnosis not present

## 2022-03-06 DIAGNOSIS — E119 Type 2 diabetes mellitus without complications: Secondary | ICD-10-CM | POA: Diagnosis not present

## 2022-03-06 DIAGNOSIS — H35362 Drusen (degenerative) of macula, left eye: Secondary | ICD-10-CM | POA: Diagnosis not present

## 2022-03-06 DIAGNOSIS — H26491 Other secondary cataract, right eye: Secondary | ICD-10-CM | POA: Diagnosis not present

## 2022-03-08 ENCOUNTER — Other Ambulatory Visit: Payer: Self-pay | Admitting: Internal Medicine

## 2022-03-08 DIAGNOSIS — J470 Bronchiectasis with acute lower respiratory infection: Secondary | ICD-10-CM

## 2022-03-11 ENCOUNTER — Other Ambulatory Visit: Payer: Self-pay | Admitting: Cardiology

## 2022-03-27 ENCOUNTER — Other Ambulatory Visit: Payer: Self-pay

## 2022-03-27 ENCOUNTER — Other Ambulatory Visit: Payer: Self-pay | Admitting: Internal Medicine

## 2022-03-27 DIAGNOSIS — K922 Gastrointestinal hemorrhage, unspecified: Secondary | ICD-10-CM

## 2022-03-27 DIAGNOSIS — Z1211 Encounter for screening for malignant neoplasm of colon: Secondary | ICD-10-CM | POA: Diagnosis not present

## 2022-03-27 DIAGNOSIS — Z1212 Encounter for screening for malignant neoplasm of rectum: Secondary | ICD-10-CM | POA: Diagnosis not present

## 2022-03-27 LAB — POC HEMOCCULT BLD/STL (HOME/3-CARD/SCREEN)
Card #2 Fecal Occult Blod, POC: POSITIVE
Card #3 Fecal Occult Blood, POC: POSITIVE
Fecal Occult Blood, POC: POSITIVE — AB

## 2022-04-02 DIAGNOSIS — M1711 Unilateral primary osteoarthritis, right knee: Secondary | ICD-10-CM | POA: Diagnosis not present

## 2022-04-09 DIAGNOSIS — R195 Other fecal abnormalities: Secondary | ICD-10-CM | POA: Diagnosis not present

## 2022-04-09 DIAGNOSIS — K5900 Constipation, unspecified: Secondary | ICD-10-CM | POA: Diagnosis not present

## 2022-04-09 DIAGNOSIS — M5416 Radiculopathy, lumbar region: Secondary | ICD-10-CM | POA: Diagnosis not present

## 2022-04-24 DIAGNOSIS — M545 Low back pain, unspecified: Secondary | ICD-10-CM | POA: Diagnosis not present

## 2022-05-07 DIAGNOSIS — M5416 Radiculopathy, lumbar region: Secondary | ICD-10-CM | POA: Diagnosis not present

## 2022-05-23 DIAGNOSIS — M545 Low back pain, unspecified: Secondary | ICD-10-CM | POA: Diagnosis not present

## 2022-05-30 ENCOUNTER — Ambulatory Visit: Payer: Medicare Other | Admitting: Cardiology

## 2022-06-04 ENCOUNTER — Ambulatory Visit (INDEPENDENT_AMBULATORY_CARE_PROVIDER_SITE_OTHER): Payer: Medicare Other | Admitting: Nurse Practitioner

## 2022-06-04 VITALS — BP 102/64 | HR 67 | Temp 96.6°F | Ht 70.0 in | Wt 175.0 lb

## 2022-06-04 DIAGNOSIS — E11319 Type 2 diabetes mellitus with unspecified diabetic retinopathy without macular edema: Secondary | ICD-10-CM | POA: Diagnosis not present

## 2022-06-04 DIAGNOSIS — R350 Frequency of micturition: Secondary | ICD-10-CM | POA: Diagnosis not present

## 2022-06-04 DIAGNOSIS — E1121 Type 2 diabetes mellitus with diabetic nephropathy: Secondary | ICD-10-CM

## 2022-06-04 DIAGNOSIS — N401 Enlarged prostate with lower urinary tract symptoms: Secondary | ICD-10-CM | POA: Diagnosis not present

## 2022-06-04 DIAGNOSIS — I10 Essential (primary) hypertension: Secondary | ICD-10-CM | POA: Diagnosis not present

## 2022-06-04 DIAGNOSIS — E559 Vitamin D deficiency, unspecified: Secondary | ICD-10-CM

## 2022-06-04 DIAGNOSIS — E1122 Type 2 diabetes mellitus with diabetic chronic kidney disease: Secondary | ICD-10-CM

## 2022-06-04 DIAGNOSIS — E785 Hyperlipidemia, unspecified: Secondary | ICD-10-CM

## 2022-06-04 DIAGNOSIS — R413 Other amnesia: Secondary | ICD-10-CM | POA: Diagnosis not present

## 2022-06-04 DIAGNOSIS — E1169 Type 2 diabetes mellitus with other specified complication: Secondary | ICD-10-CM

## 2022-06-04 DIAGNOSIS — N182 Chronic kidney disease, stage 2 (mild): Secondary | ICD-10-CM

## 2022-06-04 DIAGNOSIS — Z79899 Other long term (current) drug therapy: Secondary | ICD-10-CM | POA: Diagnosis not present

## 2022-06-04 LAB — CBC WITH DIFFERENTIAL/PLATELET
Absolute Monocytes: 962 cells/uL — ABNORMAL HIGH (ref 200–950)
Basophils Absolute: 67 cells/uL (ref 0–200)
Basophils Relative: 0.9 %
Eosinophils Absolute: 281 cells/uL (ref 15–500)
Eosinophils Relative: 3.8 %
HCT: 42.4 % (ref 38.5–50.0)
Hemoglobin: 14.7 g/dL (ref 13.2–17.1)
Lymphs Abs: 977 cells/uL (ref 850–3900)
MCH: 32.6 pg (ref 27.0–33.0)
MCHC: 34.7 g/dL (ref 32.0–36.0)
MCV: 94 fL (ref 80.0–100.0)
MPV: 9.9 fL (ref 7.5–12.5)
Monocytes Relative: 13 %
Neutro Abs: 5113 cells/uL (ref 1500–7800)
Neutrophils Relative %: 69.1 %
Platelets: 292 10*3/uL (ref 140–400)
RBC: 4.51 10*6/uL (ref 4.20–5.80)
RDW: 12.5 % (ref 11.0–15.0)
Total Lymphocyte: 13.2 %
WBC: 7.4 10*3/uL (ref 3.8–10.8)

## 2022-06-04 LAB — COMPLETE METABOLIC PANEL WITH GFR
AG Ratio: 1.8 (calc) (ref 1.0–2.5)
ALT: 11 U/L (ref 9–46)
AST: 12 U/L (ref 10–35)
Albumin: 3.9 g/dL (ref 3.6–5.1)
Alkaline phosphatase (APISO): 37 U/L (ref 35–144)
BUN: 14 mg/dL (ref 7–25)
CO2: 31 mmol/L (ref 20–32)
Calcium: 9.5 mg/dL (ref 8.6–10.3)
Chloride: 96 mmol/L — ABNORMAL LOW (ref 98–110)
Creat: 0.79 mg/dL (ref 0.70–1.22)
Globulin: 2.2 g/dL (calc) (ref 1.9–3.7)
Glucose, Bld: 122 mg/dL — ABNORMAL HIGH (ref 65–99)
Potassium: 4.3 mmol/L (ref 3.5–5.3)
Sodium: 133 mmol/L — ABNORMAL LOW (ref 135–146)
Total Bilirubin: 0.6 mg/dL (ref 0.2–1.2)
Total Protein: 6.1 g/dL (ref 6.1–8.1)
eGFR: 88 mL/min/{1.73_m2} (ref >=60)

## 2022-06-04 LAB — LIPID PANEL
Cholesterol: 138 mg/dL (ref <200)
HDL: 57 mg/dL (ref >=40)
LDL Cholesterol (Calc): 67 mg/dL (calc)
Non-HDL Cholesterol (Calc): 81 mg/dL (calc) (ref <130)
Total CHOL/HDL Ratio: 2.4 (calc) (ref <5.0)
Triglycerides: 63 mg/dL (ref <150)

## 2022-06-04 LAB — HEMOGLOBIN A1C
Hgb A1c MFr Bld: 6.1 % of total Hgb — ABNORMAL HIGH (ref <5.7)
Mean Plasma Glucose: 128 mg/dL
eAG (mmol/L): 7.1 mmol/L

## 2022-06-04 NOTE — Progress Notes (Signed)
FOLLOW UP  Assessment:   [name] was seen today for follow-up.  Diagnoses and all orders for this visit:  Essential hypertension Discussed DASH (Dietary Approaches to Stop Hypertension) DASH diet is lower in sodium than a typical American diet. Cut back on foods that are high in saturated fat, cholesterol, and trans fats. Eat more whole-grain foods, fish, poultry, and nuts Remain active and exercise as tolerated daily.  Monitor BP at home-Call if greater than 130/80.  Check CMP/CBC  Hyperlipidemia associated with type 2 diabetes mellitus (HCC)/Aortic atherosclerosis (HCC) Discussed lifestyle modifications. Recommended diet heavy in fruits and veggies, omega 3's. Decrease consumption of animal meats, cheeses, and dairy products. Remain active and exercise as tolerated. Continue to monitor. Check lipids/TSH   Type 2 diabetes mellitus with stage 2 chronic kidney disease, without long-term current use of insulin [facility]) Education: Reviewed 'ABCs' of diabetes management  Discussed goals to be met and/or maintained include A1C (<7) Blood pressure (<130/80) Cholesterol (LDL <70) Continue Eye Exam yearly  Continue Dental Exam Q6 mo Discussed dietary recommendations Discussed Physical Activity recommendations Foot exam UTD Check A1C   Vitamin D deficiency Continue supplementation Monitor Vitamin D levels  Type II diabetes mellitus with nephropathy (HCC) Continue medications Continue to monitor  Diabetic retinopathy of both eyes associated with type 2 diabetes mellitus, macular edema presence unspecified, unspecified retinopathy severity (HCC) Aggressively control sugars and BP Followed by ophthalmology Continue Yearly eye exams  BPH with obstruction/lower urinary tract symptoms Doing well at this time Continue medications: Cardura 71m, finasteride 565m Will continue to monitor  Overweight (BMI 25.0-29.9) Discussed appropriate BMI Diet modification. Physical  activity. Encouraged/praised to build confidence.   Medication management All medications discussed and reviewed in full. All questions and concerns regarding medications addressed.    Memory changes Continue to monitor Continue in home care with personal aide. Discussed benefit of memory exercises. Stay well hydrated Remain active  Orders Placed This Encounter  Procedures   CBC with Differential/Platelet   COMPLETE METABOLIC PANEL WITH GFR   Lipid panel   Hemoglobin A1c    Over 20 minutes of face to face interview, exam, counseling, chart review, and critical decision making was performed  Future Appointments  Date Time Provider DeFairchilds12/11/2021  3:00 PM [place]Carleene MainsR[place]AAM-GAA[facility] None  09/10/2022 10:30 AM McUnk PintoMD [facility] None  12/09/2022 10:30 AM WiAlycia RossettiNP [facility] None  03/10/2023 10:00 AM McUnk PintoMD [facility] None    Subjective:  David MILLEAs a [age].o. male who presents for a 3 month follow up for HTN, HLD, T2DM, and vitamin D Def.   Overall he reports feeing fairly well.  Continues to feel as though memory is slipping.  Continues to forget peoples name but able to recall family.  He is AOx3 today.  He does report having a "lady" in his home that comes daily and stays throughout the day.  He is able to continue to take medications as directed.  He is continuing to follow up with interdisciplinary team members as directed. He continues to use a cane for mobility.  Currently patient's wife is undergoing treatments for a Brain tumor bx'd as Lymphoma.     BMI is Body mass index is 25.11 kg/m., he has not been working on diet, exercise limited. He watches bread and sugar intake.  Wt Readings from Last 3 Encounters:  06/04/22 175 lb (79.4 kg)  03/03/22 185 lb 12.8 oz (84.3 kg)  02/10/22 186 lb  3.2 oz (84.5 kg)   His blood pressure has been controlled at home, today their BP is BP: 102/64 He does workout, but  limited. He denies chest pain, shortness of breath, dizziness.     In 2011 had NSTEMI w/ PCA/DES to LAD.  Doing well and follows with Dr Ellyn Hack.  He is on cholesterol medication (zetia due to statin intolerance) and nextletol denies myalgias. His cholesterol is at goal. Lab Results  Component Value Date   CHOL 116 03/03/2022   HDL 51 03/03/2022   LDLCALC 50 03/03/2022   TRIG 71 03/03/2022   CHOLHDL 2.3 03/03/2022   He has not been working on diet and exercise for T2DM controlled on metformin and diet, and denies foot ulcerations, hyperglycemia, hypoglycemia , increased appetite, nausea, paresthesia of the feet, polydipsia, polyuria, visual disturbances, vomiting and weight loss. Last A1C in the office was:  Last A1C was:  Lab Results  Component Value Date   HGBA1C 5.5 03/03/2022     Lab Results  Component Value Date   GFRNONAA 82 10/18/2020     Patient is on Vitamin D3 supplement for defciency.  Last result was to goal.  Lab Results  Component Value Date   VD25OH 42 03/03/2022     Medication Review: Current Outpatient Medications on File Prior to Visit  Medication Sig Dispense Refill   aspirin EC 81 MG tablet Take 1 tablet (81 mg total) by mouth daily. 30 tablet 11   budesonide-formoterol (SYMBICORT) 160-4.5 MCG/ACT inhaler USE 2 INHALATIONS (15 MINUTES APART) 2 TIMES PER DAY 30.6 each 3   CINNAMON PO Take 1,000 mg by mouth daily.     Cyanocobalamin (VITAMIN B-12 SL) Place 2 tablets under the tongue once a week.     donepezil (ARICEPT) 10 MG tablet TAKE 1 TABLET BY MOUTH EVERYDAY AT BEDTIME 90 tablet 3   doxazosin (CARDURA) 4 MG tablet TAKE 1 TABLET AT BEDTIME FOR BP & PROSTATE 90 tablet 3   ezetimibe (ZETIA) 10 MG tablet TAKE 1 TABLET BY MOUTH EVERY DAY 90 tablet 3   finasteride (PROSCAR) 5 MG tablet Take 5 mg by mouth daily.     lisinopril (ZESTRIL) 5 MG tablet Take 1 tablet (5 mg total) by mouth daily. 90 tablet 3   Magnesium 500 MG CAPS Take 1 capsule by mouth  daily.     metFORMIN (GLUCOPHAGE-XR) 500 MG 24 hr tablet TAKE 2 TABLETS 2 TIMES A DAY WITH MEALS FOR DIABETES 360 tablet 3   NEXLETOL 180 MG TABS TAKE 1 TABLET BY MOUTH EVERY DAY 30 tablet 10   Spacer/Aero-Holding Chambers (AEROCHAMBER PLUS) inhaler Use spacer with Inhaler 3 each 1   VITAMIN D PO Take 10,000 Units by mouth daily. Takes 10,000 units three times a week     No current facility-administered medications on file prior to visit.    Current Problems (verified) Patient Active Problem List   Diagnosis Date Noted   LBBB (left bundle branch block) 02/10/2022   Dilated cardiomyopathy ([place]) 02/10/2022   Current severe episode of major depressive disorder without psychotic features ([place]) 06/25/2021   Major neurocognitive disorder ([place]) 06/25/2021   Myalgia due to statin 08/30/2020   Venous stasis dermatitis of both lower extremities 08/30/2020   B12 deficiency 04/13/2020   Memory changes 04/12/2020   Retinopathy, diabetic, bilateral ([place]) 02/01/2018   Aortic atherosclerosis ([place]) by CT scan 2017 01/08/2017   Low back pain 06/12/2015   Synovial cyst 06/06/2015   Spondylolisthesis 06/06/2015   BPH (benign prostatic  hyperplasia) 11/16/2014   Vitamin D deficiency 12/25/2013   Medication management 12/25/2013   Overweight (BMI 25.0-29.9) 02/23/2013    Class: Chronic   Tobacco dependency 02/22/2013   Hyperlipidemia associated with type 2 diabetes mellitus ([place])    Essential hypertension    Type II diabetes mellitus with nephropathy (HCC)    CAD S/P percutaneous coronary angioplasty and PCI- DES mid LAD in the setting of non-STEMI 12/31/2009    Screening Tests Immunization History  Administered Date(s) Administered   Influenza, High Dose Seasonal PF 06/01/2013, 04/18/2014, 05/23/2015, 05/20/2016, 04/10/2017, 05/13/2018, 05/10/2019, 07/10/2021   Influenza-Unspecified 05/13/2011   PFIZER(Purple Top)SARS-COV-2 Vaccination 08/17/2019, 09/19/2019   Pneumococcal Conjugate-13  04/18/2014   Pneumococcal Polysaccharide-23 07/21/2002, 05/23/2015   Td 07/21/2000   Allergies Allergies  Allergen Reactions   Atorvastatin Other (See Comments)    MYALGIAS CRAMPING   Crestor [Rosuvastatin]     MYALGIAS CRAMPING   Statins     MYALGIAS CRAMPING   Tetanus Toxoids     UNSPECIFIED REACTION     SURGICAL HISTORY He  has a past surgical history that includes Cardiac catheterization (12/2009); Coronary angioplasty (12/2009); Cardiac catheterization (February 2012 ); Vasectomy; transthoracic echocardiogram (June 2011); Cataract extraction w/ intraocular lens  implant, bilateral; Dental surgery; Lumbar laminectomy/decompression microdiscectomy (N/A, 08/09/2015); Nasal fracture surgery; Lumbar laminectomy/decompression microdiscectomy (Right, 01/21/2016); and Lumbar laminectomy/decompression microdiscectomy (Right, 10/08/2017). FAMILY HISTORY His family history includes Cancer in his brother, father, sister, and son; Stroke in his father. SOCIAL HISTORY He  reports that he has been smoking pipe. He has never used smokeless tobacco. He reports current alcohol use of about 7.0 standard drinks of alcohol per week. He reports that he does not use drugs.  Objective:   Today's Vitals   06/04/22 1020  BP: 102/64  Pulse: 67  Temp: (!) 96.6 F (35.9 C)  SpO2: 99%  Weight: 175 lb (79.4 kg)  Height: _0  (1.778 m)   Body mass index is 25.11 kg/m.  General appearance: alert, no distress, WD/WN, male HEENT: normocephalic, sclerae anicteric, TMs pearly, nares patent, no discharge or erythema, pharynx normal Oral cavity: MMM, no lesions Neck: supple, no lymphadenopathy, no thyromegaly, no masses Heart: RRR, normal S1, S2, no murmurs Lungs: Rhonchi bilaterally, some clearing with cough, no wheezes or rales. Abdomen: +bs, soft, non tender, non distended, no masses, no hepatomegaly, no splenomegaly Skin: warm, dry, ecchymosis noted, bilateral arms. Musculoskeletal: nontender, no  swelling, no obvious deformity Extremities: no edema, no cyanosis, no clubbing Pulses: 2+ symmetric, upper and lower extremities, normal cap refill Neurological: alert, oriented x 3, CN2-12 intact, strength normal upper extremities and lower extremities, sensation normal throughout, DTRs 2+ throughout, no cerebellar signs, gait slow but steady Psychiatric: normal affect, behavior normal, pleasant    Darrol Jump, NP [facility] 06/04/2022  10:43 AM

## 2022-06-05 ENCOUNTER — Other Ambulatory Visit: Payer: Self-pay | Admitting: Nurse Practitioner

## 2022-06-05 DIAGNOSIS — E861 Hypovolemia: Secondary | ICD-10-CM

## 2022-06-05 DIAGNOSIS — E860 Dehydration: Secondary | ICD-10-CM

## 2022-06-05 DIAGNOSIS — E871 Hypo-osmolality and hyponatremia: Secondary | ICD-10-CM

## 2022-06-17 ENCOUNTER — Telehealth: Payer: Self-pay

## 2022-06-17 NOTE — Telephone Encounter (Signed)
LM-06/17/22-Pt. Returned call and is aware CP visit is cancelled and we will call to r/s when able to. Pt. Verbalized understanding and completed a General Review Call. Pt. Had no health concerns/questions he'd like to address.  Total time spent: 15 min.  General Review (HC)  Chart Review Have there been any documented new, changed, or discontinued medications since last visit?  (Include name, dose, frequency, date): No  Has there been any documented recent hospitalizations or ED visits since last visit with Clinical Lead?: No  Adherence Review Does the [facility] have access to medication refill data?: Yes Adherence rates for STAR metric medications: Ezetimibe '10mg'$  / 90DS / 12/30/21 & 03/21/22 Lisinopril '5mg'$  / 30 & 90DS / 02/17/22 & 03/16/22 Metformin '500mg'$  / 90DS / 01/13/22 & 05/01/22 Adherence rates for medications indicated for disease state being reviewed: Ezetimibe '10mg'$  / 90DS / 12/30/21 & 03/21/22 Lisinopril '5mg'$  / 30 & 90DS / 02/17/22 & 03/16/22 Metformin '500mg'$  / 90DS / 01/13/22 & 05/01/22 Does the patient have >5 day gap between last estimated fill dates for any of the above medications or other medication gaps?: Yes Reasons for medication gaps: Finasteride '5mg'$  / 90DS / 05/22/21 & 04/21/22-Pt. stated he is still taking, denied medication gap  Disease State Questions Able to connect with the Patient?: Yes Did patient have any problems with their health recently?: No Have you had any admissions or emergency room visits or worsening of your condition(s) since last visit?: No Have you had any visits with new specialists or providers since your last visit?: No Have you had any new health care problem(s) since your last visit?: No Have you run out of any of your medications since you last spoke with your clinical lead?: No Are there any medications you are not taking as prescribed?: No Are you having any issues or side effects with your medications?: No Do you have any other health concerns  or questions you want to discuss with your Clinical lead  before your next visit?: No Are there any health concerns that you feel we can do a better job addressing?: No Are you having any problems with any of the following since the last visit: (select all that apply) (Multiselect): Ambulating/walking Details: Pt. has some trouble with walking long distances. Pt. does not use any device to assist w/ ambulation  Any falls since last visit?: No Any increased or uncontrolled pain since last visit?: No Next visit Type:: Phone Visit with:: [facility] Date:: 06/17/2022 Time:: 11:00AM Additional Details: Pt. aware CP visit was cancelled and will call to r/s when ready. Pt. has no health concerns or questions at this time.

## 2022-06-17 NOTE — Telephone Encounter (Signed)
LM-06/17/22-Chart review started. Reviewing OV, Consults, Hospital visits, Labs and medication changes. Chart review complete for General Review Call. Called pt.  To completed a general review call and to inform him on CP visit cancellation and that we will call when ready to r/s. Unable to reach pt. [place] X1.  Total time spent: 13 min.

## 2022-06-24 ENCOUNTER — Ambulatory Visit: Payer: Medicare Other | Admitting: Pharmacy Technician

## 2022-06-25 ENCOUNTER — Encounter: Payer: Self-pay | Admitting: Internal Medicine

## 2022-06-27 ENCOUNTER — Telehealth: Payer: Self-pay

## 2022-06-27 NOTE — Telephone Encounter (Signed)
LM-06/27/22-Pts. Wife called to complete an assessment she states Mr. Mollenkopf does not like talking to people and that he is hard of hearing. Informed pt. Wife that I spoke to pt. On 11/28 and completed the assessment. Pt. Wife states she does not believe that because he is hard of hearing and requested I double check. Read pt. Wife TE for 11/28 that the patient returned the call and we completed the assessment. Pt. Wife states she has an appointment w/ Darrol Jump, NP soon and she will find out from her is an assessment needs to be done. Informed pt. Wife that CP requested General Review Call and re-iterated the assessment had been completed.   Total time spent:10 min.

## 2022-07-02 ENCOUNTER — Encounter: Payer: Self-pay | Admitting: Internal Medicine

## 2022-07-06 ENCOUNTER — Emergency Department (HOSPITAL_COMMUNITY): Payer: Medicare Other

## 2022-07-06 ENCOUNTER — Emergency Department (HOSPITAL_COMMUNITY)
Admission: EM | Admit: 2022-07-06 | Discharge: 2022-07-06 | Disposition: A | Discharge status: Home / Self Care | Payer: Medicare Other | Attending: Emergency Medicine | Admitting: Emergency Medicine

## 2022-07-06 DIAGNOSIS — I2510 Atherosclerotic heart disease of native coronary artery without angina pectoris: Secondary | ICD-10-CM | POA: Insufficient documentation

## 2022-07-06 DIAGNOSIS — I10 Essential (primary) hypertension: Secondary | ICD-10-CM | POA: Insufficient documentation

## 2022-07-06 DIAGNOSIS — W07XXXA Fall from chair, initial encounter: Secondary | ICD-10-CM | POA: Insufficient documentation

## 2022-07-06 DIAGNOSIS — R58 Hemorrhage, not elsewhere classified: Secondary | ICD-10-CM | POA: Diagnosis not present

## 2022-07-06 DIAGNOSIS — S0990XA Unspecified injury of head, initial encounter: Secondary | ICD-10-CM | POA: Diagnosis not present

## 2022-07-06 DIAGNOSIS — S0101XA Laceration without foreign body of scalp, initial encounter: Secondary | ICD-10-CM | POA: Diagnosis not present

## 2022-07-06 DIAGNOSIS — R102 Pelvic and perineal pain: Secondary | ICD-10-CM | POA: Diagnosis not present

## 2022-07-06 DIAGNOSIS — Z79899 Other long term (current) drug therapy: Secondary | ICD-10-CM | POA: Diagnosis not present

## 2022-07-06 DIAGNOSIS — Z7982 Long term (current) use of aspirin: Secondary | ICD-10-CM | POA: Insufficient documentation

## 2022-07-06 DIAGNOSIS — M47812 Spondylosis without myelopathy or radiculopathy, cervical region: Secondary | ICD-10-CM | POA: Diagnosis not present

## 2022-07-06 DIAGNOSIS — R000 Tachycardia, unspecified: Secondary | ICD-10-CM | POA: Diagnosis not present

## 2022-07-06 DIAGNOSIS — I6782 Cerebral ischemia: Secondary | ICD-10-CM | POA: Diagnosis not present

## 2022-07-06 DIAGNOSIS — S199XXA Unspecified injury of neck, initial encounter: Secondary | ICD-10-CM | POA: Diagnosis not present

## 2022-07-06 DIAGNOSIS — R110 Nausea: Secondary | ICD-10-CM | POA: Diagnosis not present

## 2022-07-06 DIAGNOSIS — I6529 Occlusion and stenosis of unspecified carotid artery: Secondary | ICD-10-CM | POA: Diagnosis not present

## 2022-07-06 DIAGNOSIS — G319 Degenerative disease of nervous system, unspecified: Secondary | ICD-10-CM | POA: Diagnosis not present

## 2022-07-06 DIAGNOSIS — W19XXXA Unspecified fall, initial encounter: Secondary | ICD-10-CM

## 2022-07-06 DIAGNOSIS — Z043 Encounter for examination and observation following other accident: Secondary | ICD-10-CM | POA: Diagnosis not present

## 2022-07-06 LAB — URINALYSIS, ROUTINE W REFLEX MICROSCOPIC
Bilirubin Urine: NEGATIVE
Glucose, UA: NEGATIVE mg/dL
Ketones, ur: NEGATIVE mg/dL
Leukocytes,Ua: NEGATIVE
Nitrite: NEGATIVE
Protein, ur: NEGATIVE mg/dL
Specific Gravity, Urine: 1.015 (ref 1.005–1.030)
pH: 7 (ref 5.0–8.0)

## 2022-07-06 LAB — CBC WITH DIFFERENTIAL/PLATELET
Abs Immature Granulocytes: 0.07 10*3/uL (ref 0.00–0.07)
Basophils Absolute: 0 10*3/uL (ref 0.0–0.1)
Basophils Relative: 0 %
Eosinophils Absolute: 0.1 10*3/uL (ref 0.0–0.5)
Eosinophils Relative: 1 %
HCT: 41.7 % (ref 39.0–52.0)
Hemoglobin: 13.9 g/dL (ref 13.0–17.0)
Immature Granulocytes: 1 %
Lymphocytes Relative: 3 %
Lymphs Abs: 0.3 10*3/uL — ABNORMAL LOW (ref 0.7–4.0)
MCH: 32.3 pg (ref 26.0–34.0)
MCHC: 33.3 g/dL (ref 30.0–36.0)
MCV: 96.8 fL (ref 80.0–100.0)
Monocytes Absolute: 0.4 10*3/uL (ref 0.1–1.0)
Monocytes Relative: 3 %
Neutro Abs: 10 10*3/uL — ABNORMAL HIGH (ref 1.7–7.7)
Neutrophils Relative %: 92 %
Platelets: 255 10*3/uL (ref 150–400)
RBC: 4.31 MIL/uL (ref 4.22–5.81)
RDW: 13.9 % (ref 11.5–15.5)
WBC: 10.8 10*3/uL — ABNORMAL HIGH (ref 4.0–10.5)
nRBC: 0 % (ref 0.0–0.2)

## 2022-07-06 LAB — COMPREHENSIVE METABOLIC PANEL
ALT: 16 U/L (ref 0–44)
AST: 22 U/L (ref 15–41)
Albumin: 3.8 g/dL (ref 3.5–5.0)
Alkaline Phosphatase: 38 U/L (ref 38–126)
Anion gap: 8 (ref 5–15)
BUN: 14 mg/dL (ref 8–23)
CO2: 25 mmol/L (ref 22–32)
Calcium: 9.2 mg/dL (ref 8.9–10.3)
Chloride: 102 mmol/L (ref 98–111)
Creatinine, Ser: 0.98 mg/dL (ref 0.61–1.24)
GFR, Estimated: >60 mL/min (ref >60)
Glucose, Bld: 145 mg/dL — ABNORMAL HIGH (ref 70–99)
Potassium: 4 mmol/L (ref 3.5–5.1)
Sodium: 135 mmol/L (ref 135–145)
Total Bilirubin: 1.1 mg/dL (ref 0.3–1.2)
Total Protein: 6.4 g/dL — ABNORMAL LOW (ref 6.5–8.1)

## 2022-07-06 LAB — PROTIME-INR
INR: 1.1 (ref 0.8–1.2)
Prothrombin Time: 13.7 seconds (ref 11.4–15.2)

## 2022-07-06 MED ORDER — LIDOCAINE-EPINEPHRINE (PF) 2 %-1:200000 IJ SOLN
INTRAMUSCULAR | Status: AC
  Filled 2022-07-06: qty 20

## 2022-07-06 MED ORDER — LIDOCAINE-EPINEPHRINE-TETRACAINE (LET) TOPICAL GEL
3.0000 mL | Freq: Once | TOPICAL | Status: AC
  Administered 2022-07-06: 3 mL via TOPICAL
  Filled 2022-07-06: qty 3

## 2022-07-06 MED ORDER — ACETAMINOPHEN 500 MG PO TABS
1000.0000 mg | ORAL_TABLET | Freq: Once | ORAL | Status: AC
  Administered 2022-07-06: 1000 mg via ORAL
  Filled 2022-07-06: qty 2

## 2022-07-06 NOTE — ED Notes (Signed)
Trauma Response Nurse Documentation   David [name] is a 86 y.o. male arriving to [facility] ED via EMS  On warfarin daily. Trauma was activated as a Level 2 by ED Charge RN based on the following trauma criteria Elderly patients > 65 with head trauma on anti-coagulation (excluding ASA). Trauma team at the bedside on patient arrival.   Patient cleared for CT by Dr. Gilford Raid. Pt transported to CT with trauma response nurse present to monitor. RN remained with the patient throughout their absence from the department for clinical observation.   GCS 15.  History   Past Medical History:  Diagnosis Date   Arthritis    BPH (benign prostatic hyperplasia)    CAD S/P percutaneous coronary angioplasty 12/2009; 08/2010   a) 6/'22 - NSTEMI: PCI to LAD: Promus Element 2.5 mm x 15 mm DES; b) Cath for Angina: Prox LAD 60-70% pre-stent with FFR 0.82, 90% RVM -- Med Rx, EF 50-55%   Essential hypertension    Hyperlipidemia with target LDL less than 70    Statin intolerance   Non-ST elevation MI (NSTEMI) [facility]) June 2011   PCI to LAD   Pneumonia    DEC 2016  TX WITH ANTIBIOTIC   Type 2 diabetes mellitus without complications [facility])      Past Surgical History:  Procedure Laterality Date   CARDIAC CATHETERIZATION  12/2009   Proximal LAD stenosis followed by a significant 80-90% distal stenosis   CARDIAC CATHETERIZATION  February 2012    90% ostial RV marginal branch; 60-70% proximal LAD with widely patent distal stent. FFR 0.82; medical therapy   CATARACT EXTRACTION W/ INTRAOCULAR LENS  IMPLANT, BILATERAL     CORONARY ANGIOPLASTY  12/2009   PTCA to proximal LAD; PCI with Promus Element DES stent  2.5 mm x 15 mm  distalLAD - .for non-ST elevation MI   DENTAL SURGERY     LUMBAR LAMINECTOMY/DECOMPRESSION MICRODISCECTOMY N/A 08/09/2015   Procedure: Lumbar Four-Five, Lumbar Five- Sacral One Decompressive Lumbar Laminectomy with Resection of Synovial Cyst;  Surgeon: Erline Levine, MD;  Location: [place] NEURO ORS;   Service: Neurosurgery;  Laterality: N/A;  L4 to S1 Decompressive Lumbar Laminectomy   LUMBAR LAMINECTOMY/DECOMPRESSION MICRODISCECTOMY Right 01/21/2016   Procedure: Redo Right Lumbar Five-Sacral One Laminectomy for synovial cyst;  Surgeon: Erline Levine, MD;  Location: [place] NEURO ORS;  Service: Neurosurgery;  Laterality: Right;  right   LUMBAR LAMINECTOMY/DECOMPRESSION MICRODISCECTOMY Right 10/08/2017   Procedure: Right Lumbar three-four Laminectomy for synovial cyst;  Surgeon: Erline Levine, MD;  Location: [place];  Service: Neurosurgery;  Laterality: Right;   NASAL FRACTURE SURGERY     TRANSTHORACIC ECHOCARDIOGRAM  June 2011   - (EF not reported) Moderately dilated LV; moderate hypokinesis of anteroseptal and anterior wall consistent with MI. Grade 1 diastolic dysfunction. Mild to moderately dilated LA   VASECTOMY       Initial Focused Assessment (If applicable, or please see trauma documentation): - GCS 15 - PERRLA - 6cm lac to back of head - bleeding controlled - VSS - 18G PIV to L AC  CT's Completed:   CT Head and CT C-Spine   Interventions:  - Trauma labs - CXR - Pelvic XR - CT head and c-spine - tylenol given for pain - repair of head lac  Plan for disposition:  Discharge home probable  Consults completed:  none at 1900.  Event Summary: Pt was bib [facility] from home after falling while trying to sit down in a chair.  He missed the  chair and fell striking the back of his head on the chair.  No LOC.  Pt does feel dizzy and has HA and nausea.  EMS reports pt is on warfarin however there are none listed in his med rec.   Bedside handoff with ED RN Justice Rocher and Katrina.    Clovis Cao  Trauma Response RN  Please call TRN at [phone] for further assistance.

## 2022-07-06 NOTE — ED Triage Notes (Signed)
Pt BIB [facility] from home after trying to sit down in chair. He missed the chair and fell back hit back of head on chair. Since fall pt is c/o  head pain , dizziness, weakness and nausea. EMS reported pt is on coumadin.

## 2022-07-06 NOTE — ED Notes (Signed)
Nonadherent dressing applied to back of head with bacitracin.

## 2022-07-06 NOTE — ED Notes (Signed)
Pt had slow but steady gait, complained of weakness but stated he was walking as well as normally

## 2022-07-06 NOTE — ED Notes (Signed)
Elveria Royals (Spouse) called and if Christyan is discharged to call her and she will help to get him home. [phone] or [phone]

## 2022-07-06 NOTE — Progress Notes (Signed)
Orthopedic Tech Progress Note Patient Details:  David [name] Aug 28, 1935 [id number]  Patient ID: [name], male   DOB: 08-Mar-1936, 86 y.o.   MRN: [id number] Level II; not currently needed. [name] 07/06/2022, 5:28 PM

## 2022-07-06 NOTE — ED Provider Notes (Signed)
[facility] EMERGENCY DEPARTMENT Provider Note   CSN: [id number] Arrival date & time: 07/06/22  1705     History  Chief Complaint  Patient presents with   fall on thinners    David [name] is a 86 y.o. male.  Pt is a 86 yo male with a pmhx significant for CAD, HTN, HLD, BPH, and arthritis.  Pt was trying to sit in a chair and missed the chair.  He hit the back of his head on the chair and sustained a lac.  Pt has some dizziness and nausea after hitting his head.  Family told EMS pt was on coumadin so a level 2 trauma was called.  However, upon chart review, pt is not on blood thinners.       Home Medications Prior to Admission medications   Medication Sig Start Date End Date Taking? Authorizing Provider  aspirin EC 81 MG tablet Take 1 tablet (81 mg total) by mouth daily. 05/30/19   Leonie Man, MD  budesonide-formoterol (SYMBICORT) 160-4.5 MCG/ACT inhaler USE 2 INHALATIONS (15 MINUTES APART) 2 TIMES PER DAY 03/08/22   Alycia Rossetti, NP  CINNAMON PO Take 1,000 mg by mouth daily.    [provider]  Cyanocobalamin (VITAMIN B-12 SL) Place 2 tablets under the tongue once a week.    [provider]  donepezil (ARICEPT) 10 MG tablet TAKE 1 TABLET BY MOUTH EVERYDAY AT BEDTIME 01/12/22   Alycia Rossetti, NP  doxazosin (CARDURA) 4 MG tablet TAKE 1 TABLET AT BEDTIME FOR BP & PROSTATE 12/15/21   [name], MD  ezetimibe (ZETIA) 10 MG tablet TAKE 1 TABLET BY MOUTH EVERY DAY 01/03/22   [name], NP  finasteride (PROSCAR) 5 MG tablet Take 5 mg by mouth daily.    [provider]  lisinopril (ZESTRIL) 5 MG tablet Take 1 tablet (5 mg total) by mouth daily. 03/11/22   Leonie Man, MD  Magnesium 500 MG CAPS Take 1 capsule by mouth daily.    [provider]  metFORMIN (GLUCOPHAGE-XR) 500 MG 24 hr tablet TAKE 2 TABLETS 2 TIMES A DAY WITH MEALS FOR DIABETES 10/18/21   [name], NP  NEXLETOL 180 MG TABS TAKE 1  TABLET BY MOUTH EVERY DAY 09/23/21   Leonie Man, MD  Spacer/Aero-Holding Chambers (AEROCHAMBER PLUS) inhaler Use spacer with Inhaler 11/07/20   [name], MD  VITAMIN D PO Take 10,000 Units by mouth daily. Takes 10,000 units three times a week    [provider]      Allergies    Atorvastatin, Crestor [rosuvastatin], Statins, and Tetanus toxoids    Review of Systems   Review of Systems  Skin:  Positive for wound.  Neurological:  Positive for headaches.  All other systems reviewed and are negative.   Physical Exam Updated Vital Signs BP 117/64   Pulse 84   Temp (!) 97.4 F (36.3 C) (Oral)   Resp 20   Ht '5\' 10"'$  (1.778 m)   Wt 81.6 kg   SpO2 94%   BMI 25.83 kg/m  Physical Exam Vitals and nursing note reviewed.  Constitutional:      Appearance: Normal appearance.  HENT:     Head: Normocephalic.     Comments: Lac posterior head    Right Ear: External ear normal.     Left Ear: External ear normal.     Nose: Nose normal.     Mouth/Throat:     Mouth: Mucous membranes are  moist.     Pharynx: Oropharynx is clear.  Eyes:     Extraocular Movements: Extraocular movements intact.     Conjunctiva/sclera: Conjunctivae normal.     Pupils: Pupils are equal, round, and reactive to light.  Cardiovascular:     Rate and Rhythm: Normal rate and regular rhythm.     Pulses: Normal pulses.     Heart sounds: Normal heart sounds.  Pulmonary:     Effort: Pulmonary effort is normal.     Breath sounds: Normal breath sounds.  Abdominal:     General: Abdomen is flat. Bowel sounds are normal.     Palpations: Abdomen is soft.  Musculoskeletal:        General: Normal range of motion.     Cervical back: Normal range of motion and neck supple.  Skin:    General: Skin is warm.     Capillary Refill: Capillary refill takes less than 2 seconds.  Neurological:     General: No focal deficit present.     Mental Status: He is alert and oriented to person, place, and time.   Psychiatric:        Mood and Affect: Mood normal.        Behavior: Behavior normal.     ED Results / Procedures / Treatments   Labs (all labs ordered are listed, but only abnormal results are displayed) Labs Reviewed  CBC WITH DIFFERENTIAL/PLATELET - Abnormal; Notable for the following components:      Result Value   WBC 10.8 (*)    Neutro Abs 10.0 (*)    Lymphs Abs 0.3 (*)    All other components within normal limits  URINALYSIS, ROUTINE W REFLEX MICROSCOPIC - Abnormal; Notable for the following components:   Hgb urine dipstick SMALL (*)    Bacteria, UA RARE (*)    All other components within normal limits  COMPREHENSIVE METABOLIC PANEL - Abnormal; Notable for the following components:   Glucose, Bld 145 (*)    Total Protein 6.4 (*)    All other components within normal limits  PROTIME-INR    EKG EKG Interpretation  Date/Time:  Sunday July 06 2022 17:43:02 EST Ventricular Rate:  83 PR Interval:  196 QRS Duration: 144 QT Interval:  381 QTC Calculation: 448 R Axis:   -38 Text Interpretation: Sinus rhythm Left bundle branch block new lbbb since 2/12.  However, pt has a hx of LBBB per cards notes. Confirmed by Isla Pence [phone]) on 07/06/2022 6:22:43 PM  Radiology CT Head Wo Contrast  Result Date: 07/06/2022 CLINICAL DATA:  [place] and hit back of head on Coumadin EXAM: CT HEAD WITHOUT CONTRAST CT CERVICAL SPINE WITHOUT CONTRAST TECHNIQUE: Multidetector CT imaging of the head and cervical spine was performed following the standard protocol without intravenous contrast. Multiplanar CT image reconstructions of the cervical spine were also generated. RADIATION DOSE REDUCTION: This exam was performed according to the departmental dose-optimization program which includes automated exposure control, adjustment of the mA and/or kV according to patient size and/or use of iterative reconstruction technique. COMPARISON:  None Available. FINDINGS: CT HEAD FINDINGS Brain: No  acute territorial infarction, hemorrhage or intracranial mass. Mild to moderate atrophy. Mild white matter hypodensity likely chronic small vessel ischemic change. The ventricles are nonenlarged Vascular: No hyperdense vessels.  Carotid vascular calcification Skull: Normal. Negative for fracture or focal lesion. Sinuses/Orbits: Opacified right ethmoid and frontal sinuses. Lobulated soft tissue thickening at the nasal passages. Mucous retention cyst right maxillary sinus with mucosal thickening Other: None CT  CERVICAL SPINE FINDINGS Alignment: No subluxation.  Facet alignment within normal limits. Skull base and vertebrae: No acute fracture. No primary bone lesion or focal pathologic process. Soft tissues and spinal canal: No prevertebral fluid or swelling. No visible canal hematoma. Disc levels: Partial ankylosis at multiple levels. Advanced degenerative changes C3 through C7 with disc space narrowing and bulky osteophytes. Hypertrophic facet degenerative changes at multiple levels with foraminal stenosis. Upper chest: Negative. Other: None IMPRESSION: 1.No CT evidence for acute intracranial abnormality. Atrophy and mild chronic small vessel ischemic changes of the white matter. 2. Advanced degenerative changes of the cervical spine without acute osseous abnormality. 3. Lobulated soft tissue density at the sinonasal passages, possibly due to polyps but recommend correlation with direct visualization. Electronically Signed   By: Donavan Foil M.D.   On: 07/06/2022 17:57   CT Cervical Spine Wo Contrast  Result Date: 07/06/2022 CLINICAL DATA:  [place] and hit back of head on Coumadin EXAM: CT HEAD WITHOUT CONTRAST CT CERVICAL SPINE WITHOUT CONTRAST TECHNIQUE: Multidetector CT imaging of the head and cervical spine was performed following the standard protocol without intravenous contrast. Multiplanar CT image reconstructions of the cervical spine were also generated. RADIATION DOSE REDUCTION: This exam was performed  according to the departmental dose-optimization program which includes automated exposure control, adjustment of the mA and/or kV according to patient size and/or use of iterative reconstruction technique. COMPARISON:  None Available. FINDINGS: CT HEAD FINDINGS Brain: No acute territorial infarction, hemorrhage or intracranial mass. Mild to moderate atrophy. Mild white matter hypodensity likely chronic small vessel ischemic change. The ventricles are nonenlarged Vascular: No hyperdense vessels.  Carotid vascular calcification Skull: Normal. Negative for fracture or focal lesion. Sinuses/Orbits: Opacified right ethmoid and frontal sinuses. Lobulated soft tissue thickening at the nasal passages. Mucous retention cyst right maxillary sinus with mucosal thickening Other: None CT CERVICAL SPINE FINDINGS Alignment: No subluxation.  Facet alignment within normal limits. Skull base and vertebrae: No acute fracture. No primary bone lesion or focal pathologic process. Soft tissues and spinal canal: No prevertebral fluid or swelling. No visible canal hematoma. Disc levels: Partial ankylosis at multiple levels. Advanced degenerative changes C3 through C7 with disc space narrowing and bulky osteophytes. Hypertrophic facet degenerative changes at multiple levels with foraminal stenosis. Upper chest: Negative. Other: None IMPRESSION: 1.No CT evidence for acute intracranial abnormality. Atrophy and mild chronic small vessel ischemic changes of the white matter. 2. Advanced degenerative changes of the cervical spine without acute osseous abnormality. 3. Lobulated soft tissue density at the sinonasal passages, possibly due to polyps but recommend correlation with direct visualization. Electronically Signed   By: Donavan Foil M.D.   On: 07/06/2022 17:57   DG Pelvis Portable  Result Date: 07/06/2022 CLINICAL DATA:  Fall with pelvic pain. EXAM: PORTABLE PELVIS 1 VIEW COMPARISON:  None Available. FINDINGS: No definite acute  fracture, subluxation or dislocation. The RIGHT hip is slightly rotated. Degenerative changes in the hips are noted. No focal bony lesions are present. IMPRESSION: 1. No definite acute bony abnormality. Consider dedicated hip radiographs if there is strong clinical suspicion for hip fracture. 2. Degenerative changes in the hips. Electronically Signed   By: Margarette Canada M.D.   On: 07/06/2022 17:34   DG Chest Portable 1 View  Result Date: 07/06/2022 CLINICAL DATA:  Fall. EXAM: PORTABLE CHEST 1 VIEW COMPARISON:  10/18/2021 radiograph and prior studies FINDINGS: The cardiomediastinal silhouette is unremarkable. There is no evidence of focal airspace disease, pulmonary edema, suspicious pulmonary nodule/mass, pleural effusion, or pneumothorax.  No acute bony abnormalities are identified. IMPRESSION: No active disease. Electronically Signed   By: Margarette Canada M.D.   On: 07/06/2022 17:30    Procedures .Marland KitchenLaceration Repair  Date/Time: 07/06/2022 6:20 PM  Performed by: Isla Pence, MD Authorized by: Isla Pence, MD   Consent:    Consent obtained:  Verbal   Consent given by:  Patient   Alternatives discussed:  No treatment Universal protocol:    Procedure explained and questions answered to patient or proxy's satisfaction: yes     Patient identity confirmed:  Verbally with patient Anesthesia:    Anesthesia method:  Local infiltration   Local anesthetic:  Lidocaine 2% WITH epi Laceration details:    Location:  Scalp   Scalp location:  Occipital   Length (cm):  5 Pre-procedure details:    Preparation:  Patient was prepped and draped in usual sterile fashion Exploration:    Hemostasis achieved with:  Epinephrine   Contaminated: no   Treatment:    Area cleansed with:  Chlorhexidine   Amount of cleaning:  Standard   Irrigation solution:  Sterile saline Skin repair:    Repair method:  Staples   Number of staples:  5 Approximation:    Approximation:  Close Repair type:    Repair type:   Simple Post-procedure details:    Dressing:  Sterile dressing   Procedure completion:  Tolerated well, no immediate complications     Medications Ordered in ED Medications  lidocaine-EPINEPHrine (XYLOCAINE W/EPI) 2 %-1:200000 (PF) injection (  Not Given 07/06/22 1847)  lidocaine-EPINEPHrine-tetracaine (LET) topical gel (3 mLs Topical Given by Other 07/06/22 1846)  acetaminophen (TYLENOL) tablet 1,000 mg (1,000 mg Oral Given 07/06/22 1836)    ED Course/ Medical Decision Making/ A&P                           Medical Decision Making Amount and/or Complexity of Data Reviewed Labs: ordered. Radiology: ordered.  Risk OTC drugs.   This patient presents to the ED for concern of fall, this involves an extensive number of treatment options, and is a complaint that carries with it a high risk of complications and morbidity.  The differential diagnosis includes multiple trauma   Co morbidities that complicate the patient evaluation  AD, HTN, HLD, BPH, and arthritis   Additional history obtained:  Additional history obtained from epic chart review External records from outside source obtained and reviewed including EMS report   Lab Tests:  I Ordered, and personally interpreted labs.  The pertinent results include:  cbc nl, cmp nl, inr nl, ua neg   Imaging Studies ordered:  I ordered imaging studies including ct head/c-spine/pelvis/chest  I independently visualized and interpreted imaging which showed  CT head/c-spine .No CT evidence for acute intracranial abnormality. Atrophy and  mild chronic small vessel ischemic changes of the white matter.    2. Advanced degenerative changes of the cervical spine without acute  osseous abnormality.    3. Lobulated soft tissue density at the sinonasal passages, possibly  due to polyps but recommend correlation with direct visualization.  Pelvis: 1. No definite acute bony abnormality. Consider dedicated hip  radiographs if there is  strong clinical suspicion for hip fracture.  2. Degenerative changes in the hips.  CXR: No active disease.  I agree with the radiologist interpretation   Cardiac Monitoring:  The patient was maintained on a cardiac monitor.  I personally viewed and interpreted the cardiac monitored which showed an underlying  rhythm of: nsr   Medicines ordered and prescription drug management:  I ordered medication including tylenol  for pain  Reevaluation of the patient after these medicines showed that the patient improved I have reviewed the patients home medicines and have made adjustments as needed   Test Considered:  ct  Problem List / ED Course:  Fall with head injury:  pt is not on blood thinners.  CT neg.  Pt is able to ambulate.  He is stable for d/c.  Return if worse. Staples need to be removed in 7-10 days.   Reevaluation:  After the interventions noted above, I reevaluated the patient and found that they have :improved   Social Determinants of Health:  Lives at home with wife   Dispostion:  After consideration of the diagnostic results and the patients response to treatment, I feel that the patent would benefit from discharge with outpatient f/u.          Final Clinical Impression(s) / ED Diagnoses Final diagnoses:  Fall, initial encounter  Scalp laceration, initial encounter    Rx / DC Orders ED Discharge Orders     None         Isla Pence, MD 07/06/22 2018

## 2022-07-07 ENCOUNTER — Telehealth: Payer: Self-pay

## 2022-07-07 NOTE — Telephone Encounter (Signed)
LM-07/07/22-Called pt/wife to complete ED discharge protocol. Unable to reach pt. LVMTRC X1. (2 min.)

## 2022-07-07 NOTE — Telephone Encounter (Signed)
LM-07/07/22-Called pt. To complete ED discharge protocol. [place] X2.  Total time spent: 2 min

## 2022-07-07 NOTE — Telephone Encounter (Signed)
LM-07/07/22-Spoke to pts. Wife and completed ED discharge protocol. (8 min.)  What led you to go to the Emergency Room?: fall and dizziness  Was it trauma event or true emergency? (Chest pain, loss of consciousness, stroke, motor vehicle accident, fracture, fall, confusion, etc.): Yes Was it recommended that you follow up with your health care provider or another provider?: Yes Do you already have a follow up with your Provider or specialist?: No Scheduled appointment for Patient: No Pt.wife understands the instructions received from the Emergency Room on warning signs or symptoms that would indicate a need to call the doctor(s), or be seen before next appointment Labs, Procedures, X-ray were performed Has wife to help take care of him, no barriers, No questions/concerns

## 2022-07-10 DIAGNOSIS — M48062 Spinal stenosis, lumbar region with neurogenic claudication: Secondary | ICD-10-CM | POA: Diagnosis not present

## 2022-07-15 NOTE — Progress Notes (Unsigned)
Future Appointments  Date Time Provider Department  07/16/2022 11:30 AM [name], MD [facility]  09/10/2022                     6 mon   10:30 AM [name], MD [facility]  12/09/2022                     wellness 10:30 AM Alycia Rossetti, NP [facility]  03/10/2023                     cpe 10:00 AM [name], MD [facility]    History of Present Illness:       Thi patient is a  very nice 86 y.o. 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 Since the ER evaluation, he apparently has been stable & presents for staple removal.      Medications  Current Outpatient Medications (Endocrine & Metabolic):    metFORMIN (GLUCOPHAGE-XR) 500 MG 24 hr tablet, TAKE 2 TABLETS 2 TIMES A DAY WITH MEALS FOR DIABETES  Current Outpatient Medications (Cardiovascular):    doxazosin (CARDURA) 4 MG tablet, TAKE 1 TABLET AT BEDTIME FOR BP & PROSTATE   ezetimibe (ZETIA) 10 MG tablet, TAKE 1 TABLET BY MOUTH EVERY DAY   lisinopril (ZESTRIL) 5 MG tablet, Take 1 tablet (5 mg total) by mouth daily.   NEXLETOL 180 MG TABS, TAKE 1 TABLET BY MOUTH EVERY DAY  Current Outpatient Medications (Respiratory):    budesonide-formoterol (SYMBICORT) 160-4.5 MCG/ACT inhaler, USE 2 INHALATIONS (15 MINUTES APART) 2 TIMES PER DAY  Current Outpatient Medications (Analgesics):    aspirin EC 81 MG tablet, Take 1 tablet (81 mg total) by mouth daily.  Current Outpatient Medications (Hematological):    Cyanocobalamin (VITAMIN B-12 SL), Place 2 tablets under the tongue once a week.  Current Outpatient Medications (Other):    CINNAMON PO, Take 1,000 mg by mouth daily.   donepezil (ARICEPT) 10 MG tablet, TAKE 1 TABLET BY MOUTH EVERYDAY AT BEDTIME    finasteride (PROSCAR) 5 MG tablet, Take 5 mg by mouth daily.   Magnesium 500 MG CAPS, Take 1 capsule by mouth daily.   Spacer/Aero-Holding Chambers (AEROCHAMBER PLUS) inhaler, Use spacer with Inhaler   VITAMIN D PO, Take 10,000 Units by mouth daily. Takes 10,000 units three times a week  Problem list He has CAD S/P percutaneous coronary angioplasty and PCI- DES mid LAD in the setting of non-STEMI; Hyperlipidemia associated with type 2 diabetes mellitus ([place]); Essential hypertension; Type II diabetes mellitus with nephropathy ([place]); Tobacco dependency; Overweight (BMI 25.0-29.9); Vitamin D deficiency; Medication management; BPH (benign prostatic hyperplasia); Synovial cyst; Aortic atherosclerosis ([place]) by CT scan 2017; Retinopathy, diabetic, bilateral ([place]); Memory changes; B12 deficiency; Myalgia due to statin; Venous stasis dermatitis of both lower extremities; Current severe episode of major depressive disorder without psychotic features ([place]); Low back pain; Major neurocognitive disorder ([place]); Spondylolisthesis; LBBB (left bundle branch block); and Dilated cardiomyopathy ([place]) on their problem list.   Observations/Objective:  There were no vitals taken for this visit.  HEENT - WNL. Neck - supple.  Chest - Clear equal BS. Cor - Nl HS. RRR w/o  sig MGR. PP 1(+). No edema. MS- FROM w/o deformities.  Gait Nl. Neuro -  Nl w/o focal abnormalities.   Assessment and Plan:      Follow Up Instructions:        I discussed the assessment and treatment plan with the patient. The patient was provided an opportunity to ask questions and all were answered. The patient agreed with the plan and demonstrated an understanding of the instructions.       The patient was advised to call back or seek an in-person evaluation if the symptoms worsen or if the condition fails to improve as anticipated.    Kirtland Bouchard, MD

## 2022-07-16 ENCOUNTER — Ambulatory Visit (INDEPENDENT_AMBULATORY_CARE_PROVIDER_SITE_OTHER): Payer: Medicare Other | Admitting: Internal Medicine

## 2022-07-16 ENCOUNTER — Encounter: Payer: Self-pay | Admitting: Internal Medicine

## 2022-07-16 VITALS — BP 104/68 | HR 74 | Temp 97.9°F | Resp 16 | Ht 70.0 in | Wt 170.2 lb

## 2022-07-16 DIAGNOSIS — I2510 Atherosclerotic heart disease of native coronary artery without angina pectoris: Secondary | ICD-10-CM | POA: Diagnosis not present

## 2022-07-16 DIAGNOSIS — S0003XD Contusion of scalp, subsequent encounter: Secondary | ICD-10-CM

## 2022-07-16 DIAGNOSIS — Z9861 Coronary angioplasty status: Secondary | ICD-10-CM | POA: Diagnosis not present

## 2022-07-16 DIAGNOSIS — S0101XS Laceration without foreign body of scalp, sequela: Secondary | ICD-10-CM

## 2022-07-25 ENCOUNTER — Other Ambulatory Visit: Payer: Self-pay | Admitting: Internal Medicine

## 2022-09-09 NOTE — Progress Notes (Unsigned)
Future Appointments  Date Time Provider Department  09/10/2022 10:30 AM David Pinto, MD [facility]  12/09/2022                   wellness 10:30 AM David Rossetti, NP [facility]  03/10/2023                   cpe 10:00 AM David Pinto, MD [facility]    History of Present Illness:       This very nice 87 y.o.  MWM presents for 6  month follow up with HTN, HLD, T2_NIDDM and Vitamin D Deficiency.  In 2017 Abdominal CT scan showed Aortic Atherosclerosis.         Patient is treated for HTN (2000) & BP has been controlled at home. Today's BP is at goal -                            .   In 2011, patient underwent PCA/DES to the LAD and has done well since.  Patient has had no complaints of any cardiac type chest pain, palpitations, dyspnea / orthopnea / PND, dizziness, claudication, or dependent edema.        Hyperlipidemia is controlled with diet & Nexletol David [name]. Patient denies myalgias or other med SE's. Last Lipids were at goal:  Lab Results  Component Value Date   CHOL 138 06/04/2022   HDL 57 06/04/2022   LDLCALC 67 06/04/2022   TRIG 63 06/04/2022   CHOLHDL 2.4 06/04/2022     Also, the patient has history of T2_NIDDM  (2006) w /CKD2  and has had no symptoms of reactive hypoglycemia, diabetic polys, paresthesias or visual blurring.  Last A1c was normal & at goal:  Lab Results  Component Value Date   HGBA1C 6.1 (H) 06/04/2022                                                          Further, the patient also has history of Vitamin D Deficiency ("22" /2008) and supplements vitamin D without any suspected side-effects. Last vitamin D was at goal:  Lab Results  Component Value Date   VD25OH 42 03/03/2022     Current Outpatient Medications on File Prior to Visit  Medication Sig   aspirin EC 81 MG tablet Take daily.   CINNAMON 1,000 mg  Take daily.    doxazosin (CARDURA) 4 MG tablet TAKE 1 TABLET AT BEDTIME   ezetimibe (ZETIA) 10 MG tablet TAKE 1 TABLET EVERY  DAY   finasteride (PROSCAR) 5 MG tablet Take 5 mg  daily.   lisinopril 5 MG tablet TAKE 1 TABLET  DAILY.   Magnesium 500 MG CAPS Take 1 capsule  daily.   metFORMIN-XR 500 MG 24 hr tablet TAKE 2 TABs  2 x /day w/meals    CORTISPORIN OTIC suspension  Use 6-8 drops to affected ear 3 to 4 x /day   NEXLETOL 180 MG TABS TAKE 1 TABLET  DAILY.   VITAMIN D PO Take 10,000 Units  three times a week     Allergies  Allergen Reactions   Atorvastatin Other (See Comments)    MYALGIAS CRAMPING   Crestor [Rosuvastatin]     MYALGIAS CRAMPING   Statins  MYALGIAS CRAMPING   Tetanus Toxoids     UNSPECIFIED REACTION     PMHx:   Past Medical History:  Diagnosis Date   Arthritis    BPH (benign prostatic hyperplasia)    CAD S/P percutaneous coronary angioplasty 12/2009; 08/2010   a) 6/'22 - NSTEMI: PCI to LAD: Promus Element 2.5 mm x 15 mm DES; b) Cath for Angina: Prox LAD 60-70% pre-stent with FFR 0.82, 90% RVM -- Med Rx, EF 50-55%   Essential hypertension    Hyperlipidemia with target LDL less than 70    Statin intolerance   Non-ST elevation MI (NSTEMI) ([place]) June 2011   PCI to LAD   Pneumonia    DEC 2016  [place] WITH ANTIBIOTIC   Type 2 diabetes mellitus without complications ([place])     Immunization History  Administered Date(s) Administered   Influenza, High Dose Seasonal PF 06/01/2013, 04/18/2014, 05/23/2015, 05/20/2016, 04/10/2017, 05/13/2018, 05/10/2019   Influenza-Unspecified 05/13/2011   PFIZER SARS-COV-2 Vaccination 08/17/2019, 09/19/2019   Pneumococcal Conjugate-13 04/18/2014   Pneumococcal Polysaccharide-23 07/21/2002, 05/23/2015   Td 07/21/2000    Past Surgical History:  Procedure Laterality Date   CARDIAC CATHETERIZATION  12/2009   Proximal LAD stenosis followed by a significant 80-90% distal stenosis   CARDIAC CATHETERIZATION  February 2012    90% ostial RV marginal branch; 60-70% proximal LAD with widely patent distal stent. FFR 0.82; medical therapy   CATARACT EXTRACTION  W/ INTRAOCULAR LENS  IMPLANT, BILATERAL     CORONARY ANGIOPLASTY  12/2009   PTCA to proximal LAD; PCI with Promus Element DES stent  2.5 mm x 15 mm  distalLAD - .for non-ST elevation MI   DENTAL SURGERY     LUMBAR LAMINECTOMY/DECOMPRESSION MICRODISCECTOMY N/A 08/09/2015   Procedure: Lumbar Four-Five, Lumbar Five- Sacral One Decompressive Lumbar Laminectomy with Resection of Synovial Cyst;  Surgeon: Erline Levine, MD;  Location: [place] [facility];  Service: Neurosurgery;  Laterality: N/A;  L4 to S1 Decompressive Lumbar Laminectomy   LUMBAR LAMINECTOMY/DECOMPRESSION MICRODISCECTOMY Right 01/21/2016   Procedure: Redo Right Lumbar Five-Sacral One Laminectomy for synovial cyst;  Surgeon: Erline Levine, MD;  Location: [place] [facility];  Service: Neurosurgery;  Laterality: Right;  right   LUMBAR LAMINECTOMY/DECOMPRESSION MICRODISCECTOMY Right 10/08/2017   Procedure: Right Lumbar three-four Laminectomy for synovial cyst;  Surgeon: Erline Levine, MD;  Location: [place];  Service: Neurosurgery;  Laterality: Right;   NASAL FRACTURE SURGERY     TRANSTHORACIC ECHOCARDIOGRAM  June 2011   - (EF not reported) Moderately dilated LV; moderate hypokinesis of anteroseptal and anterior wall consistent with MI. Grade 1 diastolic dysfunction. Mild to moderately dilated LA   VASECTOMY      FHx:    Reviewed / unchanged  SHx:    Reviewed / unchanged   Systems Review:  Constitutional: Denies fever, chills, wt changes, headaches, insomnia, fatigue, night sweats, change in appetite. Eyes: Denies redness, blurred vision, diplopia, discharge, itchy, watery eyes.  ENT: Denies discharge, congestion, post nasal drip, epistaxis, sore throat, earache, hearing loss, dental pain, tinnitus, vertigo, sinus pain, snoring.  CV: Denies chest pain, palpitations, irregular heartbeat, syncope, dyspnea, diaphoresis, orthopnea, PND, claudication or edema. Respiratory: denies cough, dyspnea, DOE, pleurisy, hoarseness, laryngitis, wheezing.   Gastrointestinal: Denies dysphagia, odynophagia, heartburn, reflux, water brash, abdominal pain or cramps, nausea, vomiting, bloating, diarrhea, constipation, hematemesis, melena, hematochezia  or hemorrhoids. Genitourinary: Denies dysuria, frequency, urgency, nocturia, hesitancy, discharge, hematuria or flank pain. Musculoskeletal: Denies arthralgias, myalgias, stiffness, jt. swelling, pain, limping or strain/sprain.  Skin: Denies pruritus, rash, hives, warts,  acne, eczema or change in skin lesion(s). Neuro: No weakness, tremor, incoordination, spasms, paresthesia or pain. Psychiatric: Denies confusion, memory loss or sensory loss. Endo: Denies change in weight, skin or hair change.  Heme/Lymph: No excessive bleeding, bruising or enlarged lymph nodes.  Physical Exam  There were no vitals taken for this visit.  Appears  well nourished, well groomed  and in no distress.  Eyes: PERRLA, EOMs, conjunctiva no swelling or erythema. Sinuses: No frontal/maxillary tenderness ENT/Mouth: EAC's clear, TM's nl w/o erythema, bulging. Nares clear w/o erythema, swelling, exudates. Oropharynx clear without erythema or exudates. Oral hygiene is good. Tongue normal, non obstructing. Hearing intact.  Neck: Supple. Thyroid not palpable. Car 2+/2+ without bruits, nodes or JVD. Chest: Respirations nl with BS clear & equal w/o rales, rhonchi, wheezing or stridor.  Cor: Heart sounds normal w/ regular rate and rhythm without sig. murmurs, gallops, clicks or rubs. Peripheral pulses normal and equal  without edema.  Abdomen: Soft & bowel sounds normal. Non-tender w/o guarding, rebound, hernias, masses or organomegaly.  Lymphatics: Unremarkable.  Musculoskeletal: Full ROM all peripheral extremities, joint stability, 5/5 strength and normal gait.  Skin: Warm, dry without exposed rashes, lesions or ecchymosis apparent.  Neuro: Cranial nerves intact, reflexes equal bilaterally. Sensory-motor testing grossly intact.  Tendon reflexes grossly intact.  Pysch: Alert & oriented x 3.  Insight and judgement nl & appropriate. No ideations.  Assessment and Plan:  1. Essential hypertension  - Continue medication, monitor blood pressure at home.  - Continue DASH diet.  Reminder to go to the ER if any CP,  SOB, nausea, dizziness, severe HA, changes vision/speech.  - CBC with Differential/Platelet - COMPLETE METABOLIC PANEL WITH GFR - Magnesium - TSH  2. Hyperlipidemia associated with type 2 diabetes mellitus ([place])  - Continue diet/meds, exercise,& lifestyle modifications.  - Continue monitor periodic cholesterol/liver & renal functions   - Lipid panel - TSH  3. Type 2 diabetes mellitus with stage 2 chronic kidney  disease, without long-term current use of insulin ([facility])  - Continue diet, exercise  - Lifestyle modifications.  - Monitor appropriate labs.  - Hemoglobin A1c - Insulin, random  4. Vitamin D deficiency  - Continue supplementation.  - VITAMIN D 25 Hydroxyl  5. Aortic atherosclerosis ([place]) by CT scan 2017  - Lipid panel  6. Medication management  - CBC with Differential/Platelet - COMPLETE METABOLIC PANEL WITH GFR - Magnesium - Lipid panel - TSH - Hemoglobin A1c - Insulin, random - VITAMIN D 25 Hydroxy        Discussed  regular exercise, BP monitoring, weight control to achieve/maintain BMI less than 25 and discussed med and SE's. Recommended labs to assess and monitor clinical status with further disposition pending results of labs.  I discussed the assessment and treatment plan with the patient. The patient was provided an opportunity to ask questions and all were answered. The patient agreed with the plan and demonstrated an understanding of the instructions.  I provided over 30 minutes of exam, counseling, chart review and  complex critical decision making.   Kirtland Bouchard, MD

## 2022-09-10 ENCOUNTER — Encounter: Payer: Self-pay | Admitting: Internal Medicine

## 2022-09-10 ENCOUNTER — Ambulatory Visit (INDEPENDENT_AMBULATORY_CARE_PROVIDER_SITE_OTHER): Payer: Medicare Other | Admitting: Internal Medicine

## 2022-09-10 VITALS — BP 110/60 | HR 61 | Temp 97.9°F | Resp 17 | Ht 70.0 in | Wt 167.2 lb

## 2022-09-10 DIAGNOSIS — E538 Deficiency of other specified B group vitamins: Secondary | ICD-10-CM

## 2022-09-10 DIAGNOSIS — Z79899 Other long term (current) drug therapy: Secondary | ICD-10-CM | POA: Diagnosis not present

## 2022-09-10 DIAGNOSIS — N182 Chronic kidney disease, stage 2 (mild): Secondary | ICD-10-CM

## 2022-09-10 DIAGNOSIS — E1169 Type 2 diabetes mellitus with other specified complication: Secondary | ICD-10-CM

## 2022-09-10 DIAGNOSIS — I700 Atherosclerosis of aorta: Secondary | ICD-10-CM

## 2022-09-10 DIAGNOSIS — I10 Essential (primary) hypertension: Secondary | ICD-10-CM

## 2022-09-10 DIAGNOSIS — E559 Vitamin D deficiency, unspecified: Secondary | ICD-10-CM | POA: Diagnosis not present

## 2022-09-10 DIAGNOSIS — E785 Hyperlipidemia, unspecified: Secondary | ICD-10-CM

## 2022-09-10 DIAGNOSIS — E1122 Type 2 diabetes mellitus with diabetic chronic kidney disease: Secondary | ICD-10-CM

## 2022-09-10 NOTE — Patient Instructions (Signed)

## 2022-09-11 LAB — CBC WITH DIFFERENTIAL/PLATELET
Absolute Monocytes: 658 cells/uL (ref 200–950)
Basophils Absolute: 70 cells/uL (ref 0–200)
Basophils Relative: 1 %
Eosinophils Absolute: 252 cells/uL (ref 15–500)
Eosinophils Relative: 3.6 %
HCT: 43.2 % (ref 38.5–50.0)
Hemoglobin: 14.5 g/dL (ref 13.2–17.1)
Lymphs Abs: 1155 cells/uL (ref 850–3900)
MCH: 31.9 pg (ref 27.0–33.0)
MCHC: 33.6 g/dL (ref 32.0–36.0)
MCV: 94.9 fL (ref 80.0–100.0)
MPV: 10.5 fL (ref 7.5–12.5)
Monocytes Relative: 9.4 %
Neutro Abs: 4865 cells/uL (ref 1500–7800)
Neutrophils Relative %: 69.5 %
Platelets: 321 10*3/uL (ref 140–400)
RBC: 4.55 10*6/uL (ref 4.20–5.80)
RDW: 12 % (ref 11.0–15.0)
Total Lymphocyte: 16.5 %
WBC: 7 10*3/uL (ref 3.8–10.8)

## 2022-09-11 LAB — COMPLETE METABOLIC PANEL WITH GFR
AG Ratio: 1.8 (calc) (ref 1.0–2.5)
ALT: 7 U/L — ABNORMAL LOW (ref 9–46)
AST: 12 U/L (ref 10–35)
Albumin: 4.2 g/dL (ref 3.6–5.1)
Alkaline phosphatase (APISO): 38 U/L (ref 35–144)
BUN: 13 mg/dL (ref 7–25)
CO2: 30 mmol/L (ref 20–32)
Calcium: 9.7 mg/dL (ref 8.6–10.3)
Chloride: 96 mmol/L — ABNORMAL LOW (ref 98–110)
Creat: 0.7 mg/dL (ref 0.70–1.22)
Globulin: 2.3 g/dL (calc) (ref 1.9–3.7)
Glucose, Bld: 167 mg/dL — ABNORMAL HIGH (ref 65–99)
Potassium: 4.4 mmol/L (ref 3.5–5.3)
Sodium: 134 mmol/L — ABNORMAL LOW (ref 135–146)
Total Bilirubin: 0.5 mg/dL (ref 0.2–1.2)
Total Protein: 6.5 g/dL (ref 6.1–8.1)
eGFR: 91 mL/min/{1.73_m2} (ref >=60)

## 2022-09-11 LAB — LIPID PANEL
Cholesterol: 152 mg/dL (ref <200)
HDL: 66 mg/dL (ref >=40)
LDL Cholesterol (Calc): 74 mg/dL (calc)
Non-HDL Cholesterol (Calc): 86 mg/dL (calc) (ref <130)
Total CHOL/HDL Ratio: 2.3 (calc) (ref <5.0)
Triglycerides: 50 mg/dL (ref <150)

## 2022-09-11 LAB — VITAMIN D 25 HYDROXY (VIT D DEFICIENCY, FRACTURES): Vit D, 25-Hydroxy: 50 ng/mL (ref 30–100)

## 2022-09-11 LAB — MAGNESIUM: Magnesium: 1.9 mg/dL (ref 1.5–2.5)

## 2022-09-11 LAB — TSH: TSH: 1.63 mIU/L (ref 0.40–4.50)

## 2022-09-11 LAB — VITAMIN B12: Vitamin B-12: 223 pg/mL (ref 200–1100)

## 2022-09-11 LAB — HEMOGLOBIN A1C
Hgb A1c MFr Bld: 5.5 % of total Hgb (ref <5.7)
Mean Plasma Glucose: 111 mg/dL
eAG (mmol/L): 6.2 mmol/L

## 2022-09-11 LAB — INSULIN, RANDOM: Insulin: 31.7 u[IU]/mL — ABNORMAL HIGH

## 2022-09-13 NOTE — Progress Notes (Signed)
<><><><><><><><><><><><><><><><><><><><><><><><><><><><><><><><><> <><><><><><><><><><><><><><><><><><><><><><><><><><><><><><><><><>  -    Glucose = 167  - elevated                      ( Ideal or goal is between 80 - 110 )     -  And A1c = 5.5% = Normal  & is an average glucose of 111 mg% ,   - so would NOT increase Diabetic meds   -   A1c  5.5% is Excellent !  <><><><><><><><><><><><><><><><><><><><><><><><><><><><><><><><><> <><><><><><><><><><><><><><><><><><><><><><><><><><><><><><><><><>  -  CBC is Normal  <><><><><><><><><><><><><><><><><><><><><><><><><><><><><><><><><>  - Total Chol = 152   &   LDL Chol = 74   - Both   Excellent   - Very low risk for Heart Attack  / Stroke <><><><><><><><><><><><><><><><><><><><><><><><><><><><><><><><><>  -     <><><><><><><><><><><><><><><><><><><><><><><><><><><><><><><><><>  -  Vitamin  D = 50 is "OK"  <><><><><><><><><><><><><><><><><><><><><><><><><><><><><><><><><>   -  Vitamin B12 = 223 Very Low  (Ideal or Goal Vit B12 is between 450 - 1,100)   Low Vit B12 may be associated with Anemia , Fatigue,   Peripheral Neuropathy, Dementia, "Brain Fog", & Depression  - Recommend take a sub-lingual form of Vitamin B12 tablet    5,000 mcg tab that you dissolve under your tongue  Daily   - Can get [place] - best price at [employer] or on [employer]  <><><><><><><><><><><><><><><><><><><><><><><><><><><><><><><><><> <><><><><><><><><><><><><><><><><><><><><><><><><><><><><><><><><>  -  All Else - CBC - Kidneys - Electrolytes - Liver - Magnesium & Thyroid    - all  Normal / OK  <><><><><><><><><><><><><><><><><><><><><><><><><><><><><><><><><> <><><><><><><><><><><><><><><><><><><><><><><><><><><><><><><><><>   - Extremely IMPORTANT that you increase the Vitamin B12 to 5,000 mcg Tab DAILY  &   That you take the sub-lingual form that DISSOLVES in Mouth  !  !  !    <><><><><><><><><><><><><><><><><><><><><><><><><><><><><><><><><> <><><><><><><><><><><><><><><><><><><><><><><><><><><><><><><><><>

## 2022-09-21 ENCOUNTER — Other Ambulatory Visit: Payer: Self-pay | Admitting: Cardiology

## 2022-10-10 DIAGNOSIS — M48062 Spinal stenosis, lumbar region with neurogenic claudication: Secondary | ICD-10-CM | POA: Diagnosis not present

## 2022-10-11 ENCOUNTER — Emergency Department (HOSPITAL_COMMUNITY): Payer: Medicare Other

## 2022-10-11 ENCOUNTER — Inpatient Hospital Stay (HOSPITAL_COMMUNITY)
Admission: EM | Admit: 2022-10-11 | Discharge: 2022-10-20 | DRG: 871 | Disposition: E | Payer: Medicare Other | Attending: Internal Medicine | Admitting: Internal Medicine

## 2022-10-11 ENCOUNTER — Encounter (HOSPITAL_COMMUNITY): Payer: Self-pay

## 2022-10-11 ENCOUNTER — Other Ambulatory Visit: Payer: Self-pay

## 2022-10-11 DIAGNOSIS — Z515 Encounter for palliative care: Secondary | ICD-10-CM

## 2022-10-11 DIAGNOSIS — R1310 Dysphagia, unspecified: Secondary | ICD-10-CM | POA: Diagnosis not present

## 2022-10-11 DIAGNOSIS — I252 Old myocardial infarction: Secondary | ICD-10-CM

## 2022-10-11 DIAGNOSIS — J9601 Acute respiratory failure with hypoxia: Secondary | ICD-10-CM | POA: Diagnosis not present

## 2022-10-11 DIAGNOSIS — E11319 Type 2 diabetes mellitus with unspecified diabetic retinopathy without macular edema: Secondary | ICD-10-CM | POA: Diagnosis not present

## 2022-10-11 DIAGNOSIS — Z781 Physical restraint status: Secondary | ICD-10-CM

## 2022-10-11 DIAGNOSIS — E876 Hypokalemia: Secondary | ICD-10-CM | POA: Diagnosis not present

## 2022-10-11 DIAGNOSIS — I4891 Unspecified atrial fibrillation: Secondary | ICD-10-CM | POA: Diagnosis not present

## 2022-10-11 DIAGNOSIS — R579 Shock, unspecified: Principal | ICD-10-CM

## 2022-10-11 DIAGNOSIS — J690 Pneumonitis due to inhalation of food and vomit: Secondary | ICD-10-CM | POA: Diagnosis present

## 2022-10-11 DIAGNOSIS — X31XXXA Exposure to excessive natural cold, initial encounter: Secondary | ICD-10-CM | POA: Diagnosis not present

## 2022-10-11 DIAGNOSIS — R1312 Dysphagia, oropharyngeal phase: Secondary | ICD-10-CM | POA: Diagnosis present

## 2022-10-11 DIAGNOSIS — I447 Left bundle-branch block, unspecified: Secondary | ICD-10-CM | POA: Diagnosis present

## 2022-10-11 DIAGNOSIS — Z961 Presence of intraocular lens: Secondary | ICD-10-CM | POA: Diagnosis present

## 2022-10-11 DIAGNOSIS — M6282 Rhabdomyolysis: Secondary | ICD-10-CM | POA: Diagnosis present

## 2022-10-11 DIAGNOSIS — Z043 Encounter for examination and observation following other accident: Secondary | ICD-10-CM | POA: Diagnosis not present

## 2022-10-11 DIAGNOSIS — Z66 Do not resuscitate: Secondary | ICD-10-CM | POA: Diagnosis not present

## 2022-10-11 DIAGNOSIS — E1165 Type 2 diabetes mellitus with hyperglycemia: Secondary | ICD-10-CM | POA: Diagnosis present

## 2022-10-11 DIAGNOSIS — A419 Sepsis, unspecified organism: Principal | ICD-10-CM | POA: Diagnosis present

## 2022-10-11 DIAGNOSIS — Z7951 Long term (current) use of inhaled steroids: Secondary | ICD-10-CM

## 2022-10-11 DIAGNOSIS — S80212A Abrasion, left knee, initial encounter: Secondary | ICD-10-CM | POA: Diagnosis present

## 2022-10-11 DIAGNOSIS — M1990 Unspecified osteoarthritis, unspecified site: Secondary | ICD-10-CM | POA: Diagnosis present

## 2022-10-11 DIAGNOSIS — R0682 Tachypnea, not elsewhere classified: Secondary | ICD-10-CM | POA: Diagnosis not present

## 2022-10-11 DIAGNOSIS — I700 Atherosclerosis of aorta: Secondary | ICD-10-CM | POA: Diagnosis present

## 2022-10-11 DIAGNOSIS — I5043 Acute on chronic combined systolic (congestive) and diastolic (congestive) heart failure: Secondary | ICD-10-CM | POA: Diagnosis not present

## 2022-10-11 DIAGNOSIS — I10 Essential (primary) hypertension: Secondary | ICD-10-CM | POA: Diagnosis not present

## 2022-10-11 DIAGNOSIS — I2510 Atherosclerotic heart disease of native coronary artery without angina pectoris: Secondary | ICD-10-CM | POA: Diagnosis present

## 2022-10-11 DIAGNOSIS — E875 Hyperkalemia: Secondary | ICD-10-CM | POA: Diagnosis present

## 2022-10-11 DIAGNOSIS — I110 Hypertensive heart disease with heart failure: Secondary | ICD-10-CM | POA: Diagnosis present

## 2022-10-11 DIAGNOSIS — Z888 Allergy status to other drugs, medicaments and biological substances status: Secondary | ICD-10-CM

## 2022-10-11 DIAGNOSIS — R54 Age-related physical debility: Secondary | ICD-10-CM | POA: Diagnosis present

## 2022-10-11 DIAGNOSIS — J189 Pneumonia, unspecified organism: Secondary | ICD-10-CM | POA: Diagnosis not present

## 2022-10-11 DIAGNOSIS — N179 Acute kidney failure, unspecified: Secondary | ICD-10-CM | POA: Diagnosis present

## 2022-10-11 DIAGNOSIS — I472 Ventricular tachycardia, unspecified: Secondary | ICD-10-CM | POA: Diagnosis not present

## 2022-10-11 DIAGNOSIS — F0390 Unspecified dementia without behavioral disturbance: Secondary | ICD-10-CM | POA: Diagnosis present

## 2022-10-11 DIAGNOSIS — E1121 Type 2 diabetes mellitus with diabetic nephropathy: Secondary | ICD-10-CM | POA: Diagnosis present

## 2022-10-11 DIAGNOSIS — G928 Other toxic encephalopathy: Secondary | ICD-10-CM | POA: Diagnosis not present

## 2022-10-11 DIAGNOSIS — Z7189 Other specified counseling: Secondary | ICD-10-CM | POA: Diagnosis not present

## 2022-10-11 DIAGNOSIS — R0902 Hypoxemia: Secondary | ICD-10-CM | POA: Diagnosis not present

## 2022-10-11 DIAGNOSIS — E872 Acidosis, unspecified: Secondary | ICD-10-CM | POA: Diagnosis not present

## 2022-10-11 DIAGNOSIS — T68XXXA Hypothermia, initial encounter: Secondary | ICD-10-CM

## 2022-10-11 DIAGNOSIS — M1712 Unilateral primary osteoarthritis, left knee: Secondary | ICD-10-CM | POA: Diagnosis not present

## 2022-10-11 DIAGNOSIS — R451 Restlessness and agitation: Secondary | ICD-10-CM | POA: Diagnosis not present

## 2022-10-11 DIAGNOSIS — E785 Hyperlipidemia, unspecified: Secondary | ICD-10-CM | POA: Diagnosis present

## 2022-10-11 DIAGNOSIS — R571 Hypovolemic shock: Secondary | ICD-10-CM | POA: Diagnosis not present

## 2022-10-11 DIAGNOSIS — Z7984 Long term (current) use of oral hypoglycemic drugs: Secondary | ICD-10-CM

## 2022-10-11 DIAGNOSIS — Z7982 Long term (current) use of aspirin: Secondary | ICD-10-CM

## 2022-10-11 DIAGNOSIS — W1830XA Fall on same level, unspecified, initial encounter: Secondary | ICD-10-CM | POA: Diagnosis present

## 2022-10-11 DIAGNOSIS — R6521 Severe sepsis with septic shock: Secondary | ICD-10-CM | POA: Diagnosis not present

## 2022-10-11 DIAGNOSIS — I959 Hypotension, unspecified: Secondary | ICD-10-CM | POA: Diagnosis not present

## 2022-10-11 DIAGNOSIS — I420 Dilated cardiomyopathy: Secondary | ICD-10-CM | POA: Diagnosis present

## 2022-10-11 DIAGNOSIS — N401 Enlarged prostate with lower urinary tract symptoms: Secondary | ICD-10-CM | POA: Diagnosis present

## 2022-10-11 DIAGNOSIS — F03C0 Unspecified dementia, severe, without behavioral disturbance, psychotic disturbance, mood disturbance, and anxiety: Secondary | ICD-10-CM | POA: Diagnosis not present

## 2022-10-11 DIAGNOSIS — S199XXA Unspecified injury of neck, initial encounter: Secondary | ICD-10-CM | POA: Diagnosis not present

## 2022-10-11 DIAGNOSIS — I878 Other specified disorders of veins: Secondary | ICD-10-CM | POA: Diagnosis present

## 2022-10-11 DIAGNOSIS — S51012A Laceration without foreign body of left elbow, initial encounter: Secondary | ICD-10-CM

## 2022-10-11 DIAGNOSIS — Z823 Family history of stroke: Secondary | ICD-10-CM

## 2022-10-11 DIAGNOSIS — S00412A Abrasion of left ear, initial encounter: Secondary | ICD-10-CM | POA: Diagnosis present

## 2022-10-11 DIAGNOSIS — Z955 Presence of coronary angioplasty implant and graft: Secondary | ICD-10-CM

## 2022-10-11 DIAGNOSIS — F17290 Nicotine dependence, other tobacco product, uncomplicated: Secondary | ICD-10-CM | POA: Diagnosis present

## 2022-10-11 DIAGNOSIS — R918 Other nonspecific abnormal finding of lung field: Secondary | ICD-10-CM | POA: Diagnosis not present

## 2022-10-11 DIAGNOSIS — R338 Other retention of urine: Secondary | ICD-10-CM | POA: Diagnosis present

## 2022-10-11 DIAGNOSIS — Z79899 Other long term (current) drug therapy: Secondary | ICD-10-CM

## 2022-10-11 LAB — CBC WITH DIFFERENTIAL/PLATELET
Abs Immature Granulocytes: 0.31 10*3/uL — ABNORMAL HIGH (ref 0.00–0.07)
Basophils Absolute: 0.1 10*3/uL (ref 0.0–0.1)
Basophils Relative: 0 %
Eosinophils Absolute: 0 10*3/uL (ref 0.0–0.5)
Eosinophils Relative: 0 %
HCT: 44.4 % (ref 39.0–52.0)
Hemoglobin: 15.2 g/dL (ref 13.0–17.0)
Immature Granulocytes: 1 %
Lymphocytes Relative: 4 %
Lymphs Abs: 1.1 10*3/uL (ref 0.7–4.0)
MCH: 32.3 pg (ref 26.0–34.0)
MCHC: 34.2 g/dL (ref 30.0–36.0)
MCV: 94.3 fL (ref 80.0–100.0)
Monocytes Absolute: 3.3 10*3/uL — ABNORMAL HIGH (ref 0.1–1.0)
Monocytes Relative: 11 %
Neutro Abs: 23.9 10*3/uL — ABNORMAL HIGH (ref 1.7–7.7)
Neutrophils Relative %: 84 %
Platelets: 288 10*3/uL (ref 150–400)
RBC: 4.71 MIL/uL (ref 4.22–5.81)
RDW: 13 % (ref 11.5–15.5)
WBC: 28.7 10*3/uL — ABNORMAL HIGH (ref 4.0–10.5)
nRBC: 0 % (ref 0.0–0.2)

## 2022-10-11 LAB — BASIC METABOLIC PANEL
Anion gap: 16 — ABNORMAL HIGH (ref 5–15)
BUN: 26 mg/dL — ABNORMAL HIGH (ref 8–23)
CO2: 16 mmol/L — ABNORMAL LOW (ref 22–32)
Calcium: 8.4 mg/dL — ABNORMAL LOW (ref 8.9–10.3)
Chloride: 102 mmol/L (ref 98–111)
Creatinine, Ser: 1.18 mg/dL (ref 0.61–1.24)
GFR, Estimated: >60 mL/min (ref >60)
Glucose, Bld: 106 mg/dL — ABNORMAL HIGH (ref 70–99)
Potassium: 3.9 mmol/L (ref 3.5–5.1)
Sodium: 134 mmol/L — ABNORMAL LOW (ref 135–145)

## 2022-10-11 LAB — URINALYSIS, W/ REFLEX TO CULTURE (INFECTION SUSPECTED)
Bacteria, UA: NONE SEEN
Bilirubin Urine: NEGATIVE
Glucose, UA: 50 mg/dL — AB
Ketones, ur: 5 mg/dL — AB
Leukocytes,Ua: NEGATIVE
Nitrite: NEGATIVE
Protein, ur: 100 mg/dL — AB
Specific Gravity, Urine: 1.015 (ref 1.005–1.030)
pH: 5 (ref 5.0–8.0)

## 2022-10-11 LAB — COMPREHENSIVE METABOLIC PANEL
ALT: 33 U/L (ref 0–44)
AST: 121 U/L — ABNORMAL HIGH (ref 15–41)
Albumin: 3.8 g/dL (ref 3.5–5.0)
Alkaline Phosphatase: 41 U/L (ref 38–126)
Anion gap: 15 (ref 5–15)
BUN: 24 mg/dL — ABNORMAL HIGH (ref 8–23)
CO2: 21 mmol/L — ABNORMAL LOW (ref 22–32)
Calcium: 9 mg/dL (ref 8.9–10.3)
Chloride: 95 mmol/L — ABNORMAL LOW (ref 98–111)
Creatinine, Ser: 1.36 mg/dL — ABNORMAL HIGH (ref 0.61–1.24)
GFR, Estimated: 51 mL/min — ABNORMAL LOW (ref >60)
Glucose, Bld: 116 mg/dL — ABNORMAL HIGH (ref 70–99)
Potassium: 5.2 mmol/L — ABNORMAL HIGH (ref 3.5–5.1)
Sodium: 131 mmol/L — ABNORMAL LOW (ref 135–145)
Total Bilirubin: 1.4 mg/dL — ABNORMAL HIGH (ref 0.3–1.2)
Total Protein: 6.3 g/dL — ABNORMAL LOW (ref 6.5–8.1)

## 2022-10-11 LAB — LACTIC ACID, PLASMA
Lactic Acid, Venous: 3.8 mmol/L (ref 0.5–1.9)
Lactic Acid, Venous: 3.4 mmol/L (ref 0.5–1.9)

## 2022-10-11 LAB — MRSA NEXT GEN BY PCR, NASAL: MRSA by PCR Next Gen: NOT DETECTED

## 2022-10-11 LAB — GLUCOSE, CAPILLARY
Glucose-Capillary: 122 mg/dL — ABNORMAL HIGH (ref 70–99)
Glucose-Capillary: 122 mg/dL — ABNORMAL HIGH (ref 70–99)
Glucose-Capillary: 121 mg/dL — ABNORMAL HIGH (ref 70–99)

## 2022-10-11 LAB — CK: Total CK: 5984 U/L — ABNORMAL HIGH (ref 49–397)

## 2022-10-11 MED ORDER — SODIUM CHLORIDE 0.9 % IV BOLUS
1000.0000 mL | Freq: Once | INTRAVENOUS | Status: AC
  Administered 2022-10-11: 1000 mL via INTRAVENOUS

## 2022-10-11 MED ORDER — ORAL CARE MOUTH RINSE: 15.0000 mL | OROMUCOSAL | Status: DC | PRN

## 2022-10-11 MED ORDER — LACTATED RINGERS IV SOLN: INTRAVENOUS | Status: AC

## 2022-10-11 MED ORDER — VANCOMYCIN HCL 1500 MG/300ML IV SOLN
1500.0000 mg | Freq: Once | INTRAVENOUS | Status: AC
  Administered 2022-10-11: 1500 mg via INTRAVENOUS
  Filled 2022-10-11: qty 300

## 2022-10-11 MED ORDER — ORAL CARE MOUTH RINSE
15.0000 mL | OROMUCOSAL | Status: DC
  Administered 2022-10-11 – 2022-10-15 (×15): 15 mL via OROMUCOSAL

## 2022-10-11 MED ORDER — ARFORMOTEROL TARTRATE 15 MCG/2ML IN NEBU
15.0000 ug | INHALATION_SOLUTION | Freq: Two times a day (BID) | RESPIRATORY_TRACT | Status: DC
  Administered 2022-10-11 – 2022-10-15 (×8): 15 ug via RESPIRATORY_TRACT
  Filled 2022-10-11 (×8): qty 2

## 2022-10-11 MED ORDER — INSULIN ASPART 100 UNIT/ML IJ SOLN
0.0000 [IU] | INTRAMUSCULAR | Status: DC
  Administered 2022-10-11 (×3): 2 [IU] via SUBCUTANEOUS
  Administered 2022-10-13: 3 [IU] via SUBCUTANEOUS
  Administered 2022-10-13 – 2022-10-15 (×5): 2 [IU] via SUBCUTANEOUS

## 2022-10-11 MED ORDER — NOREPINEPHRINE 4 MG/250ML-% IV SOLN
2.0000 ug/min | INTRAVENOUS | Status: DC
  Administered 2022-10-11: 2 ug/min via INTRAVENOUS
  Filled 2022-10-11: qty 250

## 2022-10-11 MED ORDER — BUDESONIDE 0.5 MG/2ML IN SUSP
0.5000 mg | Freq: Two times a day (BID) | RESPIRATORY_TRACT | Status: DC
  Administered 2022-10-11 – 2022-10-15 (×8): 0.5 mg via RESPIRATORY_TRACT
  Filled 2022-10-11 (×8): qty 2

## 2022-10-11 MED ORDER — CHLORHEXIDINE GLUCONATE CLOTH 2 % EX PADS
6.0000 | MEDICATED_PAD | Freq: Every day | CUTANEOUS | Status: DC
  Administered 2022-10-11 – 2022-10-15 (×5): 6 via TOPICAL

## 2022-10-11 MED ORDER — HEPARIN SODIUM (PORCINE) 5000 UNIT/ML IJ SOLN
5000.0000 [IU] | Freq: Three times a day (TID) | INTRAMUSCULAR | Status: DC
  Administered 2022-10-11 – 2022-10-15 (×11): 5000 [IU] via SUBCUTANEOUS
  Filled 2022-10-11 (×12): qty 1

## 2022-10-11 MED ORDER — SODIUM CHLORIDE 0.9 % IV SOLN: 250.0000 mL | INTRAVENOUS | Status: DC

## 2022-10-11 MED ORDER — VANCOMYCIN HCL IN DEXTROSE 1-5 GM/200ML-% IV SOLN: 1000.0000 mg | Freq: Once | INTRAVENOUS | Status: DC

## 2022-10-11 MED ORDER — ALBUTEROL SULFATE (2.5 MG/3ML) 0.083% IN NEBU: 2.5000 mg | INHALATION_SOLUTION | RESPIRATORY_TRACT | Status: DC | PRN

## 2022-10-11 MED ORDER — REVEFENACIN 175 MCG/3ML IN SOLN
175.0000 ug | Freq: Every day | RESPIRATORY_TRACT | Status: DC
  Administered 2022-10-12 – 2022-10-14 (×3): 175 ug via RESPIRATORY_TRACT
  Filled 2022-10-11 (×3): qty 3

## 2022-10-11 MED ORDER — VANCOMYCIN HCL IN DEXTROSE 1-5 GM/200ML-% IV SOLN: 1000.0000 mg | INTRAVENOUS | Status: DC

## 2022-10-11 MED ORDER — SODIUM CHLORIDE 0.9 % IV SOLN
2.0000 g | Freq: Once | INTRAVENOUS | Status: AC
  Administered 2022-10-11: 2 g via INTRAVENOUS
  Filled 2022-10-11: qty 12.5

## 2022-10-11 MED ORDER — METRONIDAZOLE 500 MG/100ML IV SOLN
500.0000 mg | Freq: Once | INTRAVENOUS | Status: AC
  Administered 2022-10-11: 500 mg via INTRAVENOUS
  Filled 2022-10-11: qty 100

## 2022-10-11 MED ORDER — SODIUM CHLORIDE 0.9 % IV SOLN: 2.0000 g | Freq: Two times a day (BID) | INTRAVENOUS | Status: DC

## 2022-10-11 NOTE — Progress Notes (Addendum)
Pharmacy Antibiotic Note  David [name] is a 87 y.o. male admitted on 10/13/2022 with sepsis.  Pharmacy has been consulted for vancomycin and cefepime dosing. Noted AKI, patient's Scr: 1.36 BL is 0.70.   Plan: Start cefepime 2g every 12 hours.  Give IV Vancomycin 1500 x 1 for loading dose, followed by IV Vancomycin 1000 every 24 hours for eAUC470. Using Scr: 1.36, Vd: 0.72, and IBW.  Flagyl x1 per MD.  Follow culture data for de-escalation.  Monitor renal function for dose adjustments as indicated.    Height: 5\' 10"  (177.8 cm) Weight: 75.8 kg (167 lb 1.7 oz) IBW/kg (Calculated) : 73  Temp (24hrs), Avg:91.1 F (32.8 C), Min:91.1 F (32.8 C), Max:91.1 F (32.8 C)  Recent Labs  Lab 10/06/2022 1140  WBC 28.7*    CrCl cannot be calculated (Patient's most recent lab result is older than the maximum 21 days allowed.).    Allergies  Allergen Reactions   Atorvastatin Other (See Comments)    MYALGIAS CRAMPING   Crestor [Rosuvastatin]     MYALGIAS CRAMPING   Statins     MYALGIAS CRAMPING   Tetanus Toxoids     UNSPECIFIED REACTION     Thank you for allowing pharmacy to be a part of this patient's care.  Esmeralda Arthur, PharmD, BCCCP  09/23/2022 12:58 PM

## 2022-10-11 NOTE — Progress Notes (Signed)
eLINK following for sepsis protocol. ?

## 2022-10-11 NOTE — ED Notes (Signed)
Report called to ICU nurse.

## 2022-10-11 NOTE — ED Notes (Signed)
Pt transported to CT by RN.

## 2022-10-11 NOTE — ED Notes (Signed)
Bear hugger placed on pt after rectal temp of 24F obtained

## 2022-10-11 NOTE — H&P (Signed)
NAME:  David [name], MRN:  [id number], DOB:  [birth year], LOS: 0 ADMISSION DATE:  10/15/2022, CONSULTATION DATE:  09/28/2022 REFERRING MD:  Dr. Truett Mainland, ER, CHIEF COMPLAINT:  Hypothermia   History of Present Illness:  87 yo male was found by family friend at 59 am on 3/23 outside his home after falling.  Last know contact was 5 pm on 3/22.  Noted to have temp 47F, lactic acid 3.8, CPK 5984.  CXR showed bilateral infiltrates Lt > Rt.  Started on ABx, pressors, and IV fluids.  PCCM asked to admit to ICU.  Pertinent  Medical History  Arthritis, BPH, CAD, HTN, HLD, DM type 2, PNA, Dementia  Significant Hospital Events: Including procedures, antibiotic start and stop dates in addition to other pertinent events   3/23 Admit  Interim History / Subjective:  See above.  Objective   Blood pressure 95/85, pulse (!) 55, temperature (!) 91 F (32.8 C), temperature source Rectal, resp. rate (!) 24, height 5\' 10"  (1.778 m), weight 75.8 kg, SpO2 95 %.       No intake or output data in the 24 hours ending 10/05/2022 1540 Filed Weights   10/06/2022 1136  Weight: 75.8 kg    Examination:  General - alert Eyes - pupils reactive ENT - no sinus tenderness, no stridor Cardiac - regular rate/rhythm, no murmur Chest - b/l crackles Abdomen - soft, non tender, + bowel sounds Extremities - no cyanosis, clubbing, or edema Skin - areas of ecchymosis Neuro - fidgety, follows simple commands  Resolved Hospital Problem list     Assessment & Plan:   Acute hypoxic respiratory failure from aspiration pneumonitis. - continue ABx - f/u culture results - oxygen to keep SpO2 > 92% - f/u CXR intermittently - bronchial hygiene  Hypothermia from environmental exposure. - bear hugger  Shock from sepsis and hypovolemia. - pressors to keep MAP > 65 - continue IV fluids  AKI from sepsis and hypovolemia. Hyperkalemia. Rhabdomyolysis. - baseline creatinine 0.7 from 09/10/22 - continue IV fluid - monitor  renal fx, urine outpt - f/u BMET, CPK  Hx of dementia. - will need assessment for home assistance prior to discharge  Hx of HTN, HLD, CAD s/p DES, dilated CM, LBBB. - monitor on telemetry  DM type 2 poorly controlled with hyperglycemia. - SSI  Dysphagia. - speech therapy assessment  Best Practice (right click and "Reselect all SmartList Selections" daily)   Diet/type: NPO DVT prophylaxis: systemic heparin GI prophylaxis: N/A Lines: N/A Foley:  N/A Code Status:  full code Last date of multidisciplinary goals of care discussion [updated family at bedside]  Labs   CBC: Recent Labs  Lab 09/25/2022 1140  WBC 28.7*  NEUTROABS 23.9*  HGB 15.2  HCT 44.4  MCV 94.3  PLT 123XX123    Basic Metabolic Panel: Recent Labs  Lab 10/15/2022 1140  NA 131*  K 5.2*  CL 95*  CO2 21*  GLUCOSE 116*  BUN 24*  CREATININE 1.36*  CALCIUM 9.0   GFR: Estimated Creatinine Clearance: 41.7 mL/min (A) (by C-G formula based on SCr of 1.36 mg/dL (H)). Recent Labs  Lab 10/18/2022 1140 10/13/2022 1415  WBC 28.7*  --   LATICACIDVEN 3.8* 3.4*    Liver Function Tests: Recent Labs  Lab 09/19/2022 1140  AST 121*  ALT 33  ALKPHOS 41  BILITOT 1.4*  PROT 6.3*  ALBUMIN 3.8   No results for input(s): "LIPASE", "AMYLASE" in the last 168 hours. No results for input(s): "AMMONIA" in the last  168 hours.  ABG    Component Value Date/Time   TCO2 25 12/29/2009 2151     Coagulation Profile: No results for input(s): "INR", "PROTIME" in the last 168 hours.  Cardiac Enzymes: Recent Labs  Lab 10/13/2022 1140  CKTOTAL 5,984*    HbA1C: Hgb A1c MFr Bld  Date/Time Value Ref Range Status  09/10/2022 10:21 AM 5.5 <5.7 % of total Hgb Final    Comment:    For the purpose of screening for the presence of diabetes: . <5.7%       Consistent with the absence of diabetes 5.7-6.4%    Consistent with increased risk for diabetes             (prediabetes) > or =6.5%  Consistent with diabetes . This assay  result is consistent with a decreased risk of diabetes. . Currently, no consensus exists regarding use of hemoglobin A1c for diagnosis of diabetes in children. . According to American Diabetes Association (ADA) guidelines, hemoglobin A1c <7.0% represents optimal control in non-pregnant diabetic patients. Different metrics may apply to specific patient populations.  Standards of Medical Care in Diabetes(ADA). .   06/04/2022 12:00 AM 6.1 (H) <5.7 % of total Hgb Final    Comment:    For someone without known diabetes, a hemoglobin  A1c value between 5.7% and 6.4% is consistent with prediabetes and should be confirmed with a  follow-up test. . For someone with known diabetes, a value <7% indicates that their diabetes is well controlled. A1c targets should be individualized based on duration of diabetes, age, comorbid conditions, and other considerations. . This assay result is consistent with an increased risk of diabetes. . Currently, no consensus exists regarding use of hemoglobin A1c for diagnosis of diabetes for children. .     CBG: No results for input(s): "GLUCAP" in the last 168 hours.  Review of Systems:   Reviewed and negative  Past Medical History:  He,  has a past medical history of Arthritis, BPH (benign prostatic hyperplasia), CAD S/P percutaneous coronary angioplasty (12/2009; 08/2010), Essential hypertension, Hyperlipidemia with target LDL less than 70, Non-ST elevation MI (NSTEMI) [facility]) (June 2011), Pneumonia, and Type 2 diabetes mellitus without complications ([place]).   Surgical History:   Past Surgical History:  Procedure Laterality Date   CARDIAC CATHETERIZATION  12/2009   Proximal LAD stenosis followed by a significant 80-90% distal stenosis   CARDIAC CATHETERIZATION  February 2012    90% ostial RV marginal branch; 60-70% proximal LAD with widely patent distal stent. FFR 0.82; medical therapy   CATARACT EXTRACTION W/ INTRAOCULAR LENS  IMPLANT, BILATERAL      CORONARY ANGIOPLASTY  12/2009   PTCA to proximal LAD; PCI with Promus Element DES stent  2.5 mm x 15 mm  distalLAD - .for non-ST elevation MI   DENTAL SURGERY     LUMBAR LAMINECTOMY/DECOMPRESSION MICRODISCECTOMY N/A 08/09/2015   Procedure: Lumbar Four-Five, Lumbar Five- Sacral One Decompressive Lumbar Laminectomy with Resection of Synovial Cyst;  Surgeon: Erline Levine, MD;  Location: [place] [facility];  Service: Neurosurgery;  Laterality: N/A;  L4 to S1 Decompressive Lumbar Laminectomy   LUMBAR LAMINECTOMY/DECOMPRESSION MICRODISCECTOMY Right 01/21/2016   Procedure: Redo Right Lumbar Five-Sacral One Laminectomy for synovial cyst;  Surgeon: Erline Levine, MD;  Location: [place] [facility];  Service: Neurosurgery;  Laterality: Right;  right   LUMBAR LAMINECTOMY/DECOMPRESSION MICRODISCECTOMY Right 10/08/2017   Procedure: Right Lumbar three-four Laminectomy for synovial cyst;  Surgeon: Erline Levine, MD;  Location: [facility];  Service: Neurosurgery;  Laterality: Right;  NASAL FRACTURE SURGERY     TRANSTHORACIC ECHOCARDIOGRAM  June 2011   - (EF not reported) Moderately dilated LV; moderate hypokinesis of anteroseptal and anterior wall consistent with MI. Grade 1 diastolic dysfunction. Mild to moderately dilated LA   VASECTOMY       Social History:   reports that he has been smoking pipe. He has never used smokeless tobacco. He reports current alcohol use of about 7.0 standard drinks of alcohol per week. He reports that he does not use drugs.   Family History:  His family history includes Cancer in his brother, father, sister, and son; Stroke in his father.   Allergies Allergies  Allergen Reactions   Atorvastatin Other (See Comments)    MYALGIAS CRAMPING   Crestor [Rosuvastatin]     MYALGIAS CRAMPING   Statins     MYALGIAS CRAMPING   Tetanus Toxoids     UNSPECIFIED REACTION      Home Medications  Prior to Admission medications   Medication Sig Start Date End Date Taking? Authorizing Provider   aspirin EC 81 MG tablet Take 1 tablet (81 mg total) by mouth daily. 05/30/19   Leonie Man, MD  Bempedoic Acid (NEXLETOL) 180 MG TABS TAKE 1 TABLET BY MOUTH EVERY DAY 09/23/22   Leonie Man, MD  budesonide-formoterol [facility]) 160-4.5 MCG/ACT inhaler USE 2 INHALATIONS (15 MINUTES APART) 2 TIMES PER DAY 03/08/22   Alycia Rossetti, NP  CINNAMON PO Take 1,000 mg by mouth daily.    [provider]  Cyanocobalamin (VITAMIN B-12 SL) Place 2 tablets under the tongue once a week.    [provider]  donepezil (ARICEPT) 10 MG tablet TAKE 1 TABLET BY MOUTH EVERYDAY AT BEDTIME 01/12/22   Alycia Rossetti, NP  doxazosin (CARDURA) 4 MG tablet TAKE 1 TABLET AT BEDTIME FOR BP & PROSTATE 12/15/21   [name], MD  ezetimibe (ZETIA) 10 MG tablet TAKE 1 TABLET BY MOUTH EVERY DAY 01/03/22   [name], NP  finasteride (PROSCAR) 5 MG tablet Take 5 mg by mouth daily.    [provider]  lisinopril (ZESTRIL) 5 MG tablet Take 1 tablet (5 mg total) by mouth daily. 03/11/22   Leonie Man, MD  Magnesium 500 MG CAPS Take 1 capsule by mouth daily.    [provider]  metFORMIN (GLUCOPHAGE-XR) 500 MG 24 hr tablet TAKE 2 TABLETS 2 TIMES A DAY WITH MEALS FOR DIABETES 10/18/21   [name], NP  Spacer/Aero-Holding Chambers (AEROCHAMBER PLUS) inhaler Use spacer with Inhaler 11/07/20   [name], MD  VITAMIN D PO Take 10,000 Units by mouth daily. Takes 10,000 units three times a week    [provider]     Critical care time: 40 minutes  Chesley Mires, MD [place] Pager - [phone] or [phone] 09/23/2022, 3:53 PM

## 2022-10-11 NOTE — Progress Notes (Signed)
[place] notified of current K+ 3.9.  Discussed pt EKG on admission.

## 2022-10-11 NOTE — ED Triage Notes (Signed)
Pt to the ed from home with a CC of unwitnessed fall. Pt was found outside of his house on the ground. Pt has a hx of dementia and [place] unable to say when he fell. Pt lives with family bu the family was out of town and a friend was checking on him. Pt denies pain.

## 2022-10-11 NOTE — Progress Notes (Signed)
[facility] notified of pt K+ level of 5.2 w/ elevated T waves. See new orders.

## 2022-10-11 NOTE — ED Provider Notes (Signed)
[facility] Provider Note  CSN: [id number] Arrival date & time: 10/10/2022 1119  Chief Complaint(s) Fall  HPI David [name] is a 87 y.o. male with history of dementia, hyperlipidemia, coronary artery disease, BPH, diabetes presenting to the emergency department for being found down.  Patient staying at home alone currently.  Friend is coming to visit him daily.  His wife is at the beach.  His children live distantly.  His friend who was checking in on him saw him yesterday was at his baseline.  This morning, she went to check on him and found the house door locked which was unusual.  She found him outside on the ground.  She is not sure how long he was outside, but he was wearing the same clothes as the day before.  Patient denies any complaints.  Patient's friend reports he seems at his baseline currently.  History otherwise extremely limited due to patient's underlying dementia, but patient is able to deny any specific symptoms such as headaches, chest pain, abdominal pain, neck pain, back pain, or any other acute symptoms.  Past Medical History Past Medical History:  Diagnosis Date   Arthritis    BPH (benign prostatic hyperplasia)    CAD S/P percutaneous coronary angioplasty 12/2009; 08/2010   a) 6/'22 - NSTEMI: PCI to LAD: Promus Element 2.5 mm x 15 mm DES; b) Cath for Angina: Prox LAD 60-70% pre-stent with FFR 0.82, 90% RVM -- Med Rx, EF 50-55%   Essential hypertension    Hyperlipidemia with target LDL less than 70    Statin intolerance   Non-ST elevation MI (NSTEMI) [facility]) June 2011   PCI to LAD   Pneumonia    DEC 2016  [place] WITH ANTIBIOTIC   Type 2 diabetes mellitus without complications [facility])    Patient Active Problem List   Diagnosis Date Noted   LBBB (left bundle branch block) 02/10/2022   Dilated cardiomyopathy ([place]) 02/10/2022   Current severe episode of major depressive disorder without psychotic features ([place]) 06/25/2021   Major  neurocognitive disorder ([place]) 06/25/2021   Myalgia due to statin 08/30/2020   Venous stasis dermatitis of both lower extremities 08/30/2020   B12 deficiency 04/13/2020   Memory changes 04/12/2020   Retinopathy, diabetic, bilateral ([place]) 02/01/2018   Aortic atherosclerosis ([place]) by CT scan 2017 01/08/2017   Low back pain 06/12/2015   Synovial cyst 06/06/2015   Spondylolisthesis 06/06/2015   BPH (benign prostatic hyperplasia) 11/16/2014   Vitamin D deficiency 12/25/2013   Medication management 12/25/2013   Overweight (BMI 25.0-29.9) 02/23/2013    Class: Chronic   Tobacco dependency 02/22/2013   Hyperlipidemia associated with type 2 diabetes mellitus ([place])    Essential hypertension    Type II diabetes mellitus with nephropathy ([place])    CAD S/P percutaneous coronary angioplasty and PCI- DES mid LAD in the setting of non-STEMI 12/31/2009   Home Medication(s) Prior to Admission medications   Medication Sig Start Date End Date Taking? Authorizing Provider  aspirin EC 81 MG tablet Take 1 tablet (81 mg total) by mouth daily. 05/30/19   Leonie Man, MD  Bempedoic Acid (NEXLETOL) 180 MG TABS TAKE 1 TABLET BY MOUTH EVERY DAY 09/23/22   Leonie Man, MD  budesonide-formoterol [facility]) 160-4.5 MCG/ACT inhaler USE 2 INHALATIONS (15 MINUTES APART) 2 TIMES PER DAY 03/08/22   Alycia Rossetti, NP  CINNAMON PO Take 1,000 mg by mouth daily.    [provider]  Cyanocobalamin (VITAMIN B-12  SL) Place 2 tablets under the tongue once a week.    [provider]  donepezil (ARICEPT) 10 MG tablet TAKE 1 TABLET BY MOUTH EVERYDAY AT BEDTIME 01/12/22   Alycia Rossetti, NP  doxazosin (CARDURA) 4 MG tablet TAKE 1 TABLET AT BEDTIME FOR BP & PROSTATE 12/15/21   [name], MD  ezetimibe (ZETIA) 10 MG tablet TAKE 1 TABLET BY MOUTH EVERY DAY 01/03/22   [name], NP  finasteride (PROSCAR) 5 MG tablet Take 5 mg by mouth daily.    [provider]  lisinopril (ZESTRIL) 5 MG  tablet Take 1 tablet (5 mg total) by mouth daily. 03/11/22   Leonie Man, MD  Magnesium 500 MG CAPS Take 1 capsule by mouth daily.    [provider]  metFORMIN (GLUCOPHAGE-XR) 500 MG 24 hr tablet TAKE 2 TABLETS 2 TIMES A DAY WITH MEALS FOR DIABETES 10/18/21   [name], NP  Spacer/Aero-Holding Chambers (AEROCHAMBER PLUS) inhaler Use spacer with Inhaler 11/07/20   [name], MD  VITAMIN D PO Take 10,000 Units by mouth daily. Takes 10,000 units three times a week    [provider]                                                                                                                                    Past Surgical History Past Surgical History:  Procedure Laterality Date   CARDIAC CATHETERIZATION  12/2009   Proximal LAD stenosis followed by a significant 80-90% distal stenosis   CARDIAC CATHETERIZATION  February 2012    90% ostial RV marginal branch; 60-70% proximal LAD with widely patent distal stent. FFR 0.82; medical therapy   CATARACT EXTRACTION W/ INTRAOCULAR LENS  IMPLANT, BILATERAL     CORONARY ANGIOPLASTY  12/2009   PTCA to proximal LAD; PCI with Promus Element DES stent  2.5 mm x 15 mm  distalLAD - .for non-ST elevation MI   DENTAL SURGERY     LUMBAR LAMINECTOMY/DECOMPRESSION MICRODISCECTOMY N/A 08/09/2015   Procedure: Lumbar Four-Five, Lumbar Five- Sacral One Decompressive Lumbar Laminectomy with Resection of Synovial Cyst;  Surgeon: Erline Levine, MD;  Location: [place] NEURO ORS;  Service: Neurosurgery;  Laterality: N/A;  L4 to S1 Decompressive Lumbar Laminectomy   LUMBAR LAMINECTOMY/DECOMPRESSION MICRODISCECTOMY Right 01/21/2016   Procedure: Redo Right Lumbar Five-Sacral One Laminectomy for synovial cyst;  Surgeon: Erline Levine, MD;  Location: [place] NEURO ORS;  Service: Neurosurgery;  Laterality: Right;  right   LUMBAR LAMINECTOMY/DECOMPRESSION MICRODISCECTOMY Right 10/08/2017   Procedure: Right Lumbar three-four Laminectomy for synovial cyst;  Surgeon:  Erline Levine, MD;  Location: [facility];  Service: Neurosurgery;  Laterality: Right;   NASAL FRACTURE SURGERY     TRANSTHORACIC ECHOCARDIOGRAM  June 2011   - (EF not reported) Moderately dilated LV; moderate hypokinesis of anteroseptal and anterior wall consistent with MI. Grade 1 diastolic dysfunction. Mild to moderately dilated LA   VASECTOMY  Family History Family History  Problem Relation Age of Onset   Cancer Father        lung   Stroke Father    Cancer Sister        breast   Cancer Brother        lung   Cancer Son        history of prostate    Social History Social History   Tobacco Use   Smoking status: Every Day    Types: Pipe   Smokeless tobacco: Never   Tobacco comments:    smokes pipe qd  Vaping Use   Vaping Use: Never used  Substance Use Topics   Alcohol use: Yes    Alcohol/week: 7.0 standard drinks of alcohol    Types: 7 Standard drinks or equivalent per week    Comment: drinks 4 ounces of vodka daily   Drug use: No   Allergies Atorvastatin, Crestor [rosuvastatin], Statins, and Tetanus toxoids  Review of Systems Review of Systems  All other systems reviewed and are negative.   Physical Exam Vital Signs  I have reviewed the triage vital signs BP 95/85   Pulse (!) 55   Temp (!) 91 F (32.8 C) (Rectal)   Resp (!) 24   Ht 5\' 10"  (1.778 m)   Wt 75.8 kg   SpO2 95%   BMI 23.98 kg/m  Physical Exam Vitals and nursing note reviewed.  Constitutional:      General: He is not in acute distress.    Appearance: Normal appearance.  HENT:     Head: Normocephalic.     Comments: Small abrasion to the left ear    Mouth/Throat:     Mouth: Mucous membranes are dry.  Eyes:     Conjunctiva/sclera: Conjunctivae normal.  Cardiovascular:     Rate and Rhythm: Normal rate and regular rhythm.  Pulmonary:     Effort: Pulmonary effort is normal. No respiratory distress.     Breath sounds: Rales (bibasilar) present.  Abdominal:     General: Abdomen is flat.      Palpations: Abdomen is soft.     Tenderness: There is no abdominal tenderness.  Musculoskeletal:     Cervical back: Neck supple.     Right lower leg: No edema.     Left lower leg: No edema.     Comments: No midline C, T, L-spine tenderness.  Full active range of motion of the bilateral upper and lower extremities without deformity or focal tenderness.  Slightly painful range of motion of the left knee and left hip specifically but otherwise painless range of motion.  Skin:    Capillary Refill: Capillary refill takes less than 2 seconds.     Comments: Skin tear to the left elbow.  Small abrasion to the left knee.  Neurological:     Mental Status: He is alert. Mental status is at baseline.     Comments: Oriented to self, knows he is in the hospital but does not know why he is in the hospital or what year it is.  Cranial nerves II through XII intact.  Moving all 4 extremities equally  Psychiatric:        Mood and Affect: Mood normal.        Behavior: Behavior normal.     ED Results and Treatments Labs (all labs ordered are listed, but only abnormal results are displayed) Labs Reviewed  COMPREHENSIVE METABOLIC PANEL - Abnormal; Notable for the following components:  Result Value   Sodium 131 (*)    Potassium 5.2 (*)    Chloride 95 (*)    CO2 21 (*)    Glucose, Bld 116 (*)    BUN 24 (*)    Creatinine, Ser 1.36 (*)    Total Protein 6.3 (*)    AST 121 (*)    Total Bilirubin 1.4 (*)    GFR, Estimated 51 (*)    All other components within normal limits  CBC WITH DIFFERENTIAL/PLATELET - Abnormal; Notable for the following components:   WBC 28.7 (*)    Neutro Abs 23.9 (*)    Monocytes Absolute 3.3 (*)    Abs Immature Granulocytes 0.31 (*)    All other components within normal limits  CK - Abnormal; Notable for the following components:   Total CK 5,984 (*)    All other components within normal limits  LACTIC ACID, PLASMA - Abnormal; Notable for the following components:    Lactic Acid, Venous 3.8 (*)    All other components within normal limits  LACTIC ACID, PLASMA - Abnormal; Notable for the following components:   Lactic Acid, Venous 3.4 (*)    All other components within normal limits  URINALYSIS, W/ REFLEX TO CULTURE (INFECTION SUSPECTED) - Abnormal; Notable for the following components:   Glucose, UA 50 (*)    Hgb urine dipstick SMALL (*)    Ketones, ur 5 (*)    Protein, ur 100 (*)    All other components within normal limits  CULTURE, BLOOD (SINGLE)  PATHOLOGIST SMEAR REVIEW                                                                                                                          Radiology DG Knee Complete 4 Views Left  Result Date: 10/15/2022 CLINICAL DATA:  fall EXAM: LEFT KNEE - COMPLETE 4+ VIEW COMPARISON:  None Available. FINDINGS: No fracture or dislocation. Small effusion in the suprapatellar bursa. Marginal spurs from the patellar articular surface, tibial plateau, and medial femoral condyle. There is marked narrowing of articular cartilage in the medial compartment. IMPRESSION: 1. 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 2. Small effusion. Electronically Signed   By: Lucrezia Europe M.D.   On: 10/05/2022 14:06   DG Hip Unilat With Pelvis 2-3 Views Left  Result Date: 10/17/2022 CLINICAL DATA:  fall EXAM: DG HIP (WITH OR WITHOUT PELVIS) 2-3V LEFT COMPARISON:  07/06/2022 FINDINGS: No fracture or dislocation. Bilateral hip DJD. Spondylitic changes in the visualized lower lumbar spine. Bilateral pelvic phleboliths. IMPRESSION: No acute findings. Bilateral hip DJD. Electronically Signed   By: Lucrezia Europe M.D.   On: 09/24/2022 14:03   DG Chest Port 1 View  Result Date: 10/08/2022 CLINICAL DATA:  Fall EXAM: PORTABLE CHEST - 1 VIEW COMPARISON:  07/06/2022 FINDINGS: Mild interstitial opacities throughout the left lung, and to a milder degree on the right in a perihilar distribution. Heart size normal.  Aortic Atherosclerosis  (ICD10-170.0). Mild elevation  of left diaphragmatic leaflet. No definite effusion. No evident pneumothorax. Visualized bones unremarkable. IMPRESSION: Mild asymmetric interstitial opacities, left worse than right. Electronically Signed   By: Lucrezia Europe M.D.   On: 09/25/2022 14:02   CT Cervical Spine Wo Contrast  Result Date: 10/04/2022 CLINICAL DATA:  Neck trauma. EXAM: CT CERVICAL SPINE WITHOUT CONTRAST TECHNIQUE: Multidetector CT imaging of the cervical spine was performed without intravenous contrast. Multiplanar CT image reconstructions were also generated. RADIATION DOSE REDUCTION: This exam was performed according to the departmental dose-optimization program which includes automated exposure control, adjustment of the mA and/or kV according to patient size and/or use of iterative reconstruction technique. COMPARISON:  07/06/2022. FINDINGS: Alignment: Normal. Skull base and vertebrae: No acute fracture. No primary bone lesion or focal pathologic process. Soft tissues and spinal canal: No prevertebral fluid or swelling. No visible canal hematoma. Disc levels: Disc space narrowing with marginal osteophyte formation identified throughout the cervical spine and upper thoracic spine consistent with degenerative disc disease. Osteoarthritis identified at C1-C2. Upper chest: Alveolar consolidation or volume loss left upper lobe. IMPRESSION: Degenerative changes. No acute traumatic abnormalities. Left upper lung alveolar density. Electronically Signed   By: Sammie Bench M.D.   On: 10/17/2022 13:19   CT Head Wo Contrast  Result Date: 09/25/2022 CLINICAL DATA:  Unwitnessed fall EXAM: CT HEAD WITHOUT CONTRAST TECHNIQUE: Contiguous axial images were obtained from the base of the skull through the vertex without intravenous contrast. RADIATION DOSE REDUCTION: This exam was performed according to the departmental dose-optimization program which includes automated exposure control, adjustment of the mA and/or kV  according to patient size and/or use of iterative reconstruction technique. COMPARISON:  07/06/2022 FINDINGS: Brain: No evidence of acute infarction, hemorrhage, hydrocephalus, extra-axial collection or mass lesion/mass effect. Scattered low-density changes within the periventricular and subcortical white matter most compatible with chronic microvascular ischemic change. Mild-moderate diffuse cerebral volume loss. Vascular: Atherosclerotic calcifications involving the large vessels of the skull base. No unexpected hyperdense vessel. Skull: Normal. Negative for fracture or focal lesion. Sinuses/Orbits: Chronic mild right-sided paranasal sinus disease. Mastoid air cells are clear. Other: None. IMPRESSION: 1. No acute intracranial findings. 2. Chronic microvascular ischemic change and cerebral volume loss. Electronically Signed   By: Davina Poke D.O.   On: 10/09/2022 13:14    Pertinent labs & imaging results that were available during my care of the patient were reviewed by me and considered in my medical decision making (see MDM for details).  Medications Ordered in ED Medications  lactated ringers infusion ( Intravenous New Bag/Given 09/30/2022 1405)  ceFEPIme (MAXIPIME) 2 g in sodium chloride 0.9 % 100 mL IVPB (has no administration in time range)  vancomycin (VANCOREADY) IVPB 1500 mg/300 mL (1,500 mg Intravenous New Bag/Given 10/04/2022 1415)  0.9 %  sodium chloride infusion (has no administration in time range)  norepinephrine (LEVOPHED) 4mg  in 246mL (0.016 mg/mL) premix infusion (2 mcg/min Intravenous New Bag/Given 10/08/2022 1448)  sodium chloride 0.9 % bolus 1,000 mL (1,000 mLs Intravenous New Bag/Given 10/17/2022 1210)  sodium chloride 0.9 % bolus 1,000 mL (1,000 mLs Intravenous New Bag/Given 09/25/2022 1410)  metroNIDAZOLE (FLAGYL) IVPB 500 mg (500 mg Intravenous New Bag/Given 09/19/2022 1406)  Procedures .Critical Care  Performed by: Cristie Hem, MD Authorized by: Cristie Hem, MD   Critical care provider statement:    Critical care time (minutes):  30   Critical care was necessary to treat or prevent imminent or life-threatening deterioration of the following conditions:  Shock and sepsis   Critical care was time spent personally by me on the following activities:  Development of treatment plan with patient or surrogate, discussions with consultants, evaluation of patient's response to treatment, examination of patient, ordering and review of laboratory studies, ordering and review of radiographic studies, ordering and performing treatments and interventions, pulse oximetry, re-evaluation of patient's condition and review of old charts   Care discussed with: admitting provider     (including critical care time)  Medical Decision Making / ED Course   MDM:  87 year old male presenting into the emergency department after being found down by a friend.  Patient wearing same clothes as yesterday when found, possible that he was outside for overnight.  It has been raining.  Vital signs notable for hypothermia to 91.  Patient also hypotensive.  Patient found to be hypoxic and started on oxygen.  Traumatic exam with few small skin tears.  X-rays obtained of the left knee and the left hip, given slightly increased painful range of motion, negative for fracture.  CT head and neck obtained given fall and unknown whether patient hit head, negative for acute process.  Laboratory workup obtained given vital sign abnormalities, notable for leukocytosis, acute kidney injury.  CK also checked given patient found down, it is elevated suggestive of mild rhabdomyolysis.  Patient did receive IV fluids with some improvement in his blood pressure.  His lactic acid is also elevated.  Although hypothermia and low blood pressure could be related to exposure, sepsis workup was  also obtained including blood culture, shows a possible pneumonia, urinalysis not concerning for infection.  Patient initially started on broad-spectrum antibiotics given unknown source but likely can be backed off to cover for community-acquired pneumonia.  Patient remains hypothermic so will be receiving warmed IV fluids.  Patient will need admission for treatment of his pneumonia, rhabdomyolysis and acute kidney injury.   Clinical Course as of 10/18/2022 1517  Sat Oct 11, 2022  1512 Discussed with ICU team, given patient low blood pressure.  Will start on Levophed.  Apparently, initial blood culture was rejected due to it being "wet".  Will have a repeat set obtained as ultrasound IV is being placed by nursing. [WS]    Clinical Course User Index [WS] Cristie Hem, MD     Additional history obtained: -Additional history obtained from ems and friend -External records from outside source obtained and reviewed including: Chart review including previous notes, labs, imaging, consultation notes including ED visit for fall 07/06/22 for fall   Lab Tests: -I ordered, reviewed, and interpreted labs.   The pertinent results include:   Labs Reviewed  COMPREHENSIVE METABOLIC PANEL - Abnormal; Notable for the following components:      Result Value   Sodium 131 (*)    Potassium 5.2 (*)    Chloride 95 (*)    CO2 21 (*)    Glucose, Bld 116 (*)    BUN 24 (*)    Creatinine, Ser 1.36 (*)    Total Protein 6.3 (*)    AST 121 (*)    Total Bilirubin 1.4 (*)    GFR, Estimated 51 (*)    All other components within normal limits  CBC WITH DIFFERENTIAL/PLATELET - Abnormal; Notable for the following components:   WBC 28.7 (*)    Neutro Abs 23.9 (*)    Monocytes Absolute 3.3 (*)    Abs Immature Granulocytes 0.31 (*)    All other components within normal limits  CK - Abnormal; Notable for the following components:   Total CK 5,984 (*)    All other components within normal limits  LACTIC ACID,  PLASMA - Abnormal; Notable for the following components:   Lactic Acid, Venous 3.8 (*)    All other components within normal limits  LACTIC ACID, PLASMA - Abnormal; Notable for the following components:   Lactic Acid, Venous 3.4 (*)    All other components within normal limits  URINALYSIS, W/ REFLEX TO CULTURE (INFECTION SUSPECTED) - Abnormal; Notable for the following components:   Glucose, UA 50 (*)    Hgb urine dipstick SMALL (*)    Ketones, ur 5 (*)    Protein, ur 100 (*)    All other components within normal limits  CULTURE, BLOOD (SINGLE)  PATHOLOGIST SMEAR REVIEW    Notable for lactic acidosis, elevated CK, leukocytosis, acute kidney injury  EKG   EKG Interpretation  Date/Time:  Saturday October 11 2022 14:18:40 EDT Ventricular Rate:  78 PR Interval:  52 QRS Duration: 140 QT Interval:  439 QTC Calculation: 501 R Axis:   109 Text Interpretation: Atrial fibrillation Nonspecific intraventricular conduction delay Probable anterolateral infarct, age indeterm Confirmed by Garnette Gunner [phone]) on 10/02/2022 3:10:47 PM         Imaging Studies ordered: I ordered imaging studies including CXR, XR left hip and knee, CT head and neck On my interpretation imaging demonstrates no fracture/ICH. CXR with multifocal PNA I independently visualized and interpreted imaging. I agree with the radiologist interpretation   Medicines ordered and prescription drug management: Meds ordered this encounter  Medications   sodium chloride 0.9 % bolus 1,000 mL   sodium chloride 0.9 % bolus 1,000 mL   lactated ringers infusion   ceFEPIme (MAXIPIME) 2 g in sodium chloride 0.9 % 100 mL IVPB    Order Specific Question:   Antibiotic Indication:    Answer:   Other Indication (list below)    Order Specific Question:   Other Indication:    Answer:   Unknown source   metroNIDAZOLE (FLAGYL) IVPB 500 mg    Order Specific Question:   Antibiotic Indication:    Answer:   Other Indication (list  below)    Order Specific Question:   Other Indication:    Answer:   Unknown source   DISCONTD: vancomycin (VANCOCIN) IVPB 1000 mg/200 mL premix    Order Specific Question:   Indication:    Answer:   Other Indication (list below)    Order Specific Question:   Other Indication:    Answer:   Unknown source   vancomycin (VANCOREADY) IVPB 1500 mg/300 mL    Order Specific Question:   Indication:    Answer:   Sepsis   DISCONTD: ceFEPIme (MAXIPIME) 2 g in sodium chloride 0.9 % 100 mL IVPB    Order Specific Question:   Antibiotic Indication:    Answer:   Sepsis   DISCONTD: vancomycin (VANCOCIN) IVPB 1000 mg/200 mL premix    Order Specific Question:   Indication:    Answer:   Sepsis   0.9 %  sodium chloride infusion   norepinephrine (LEVOPHED) 4mg  in 223mL (0.016 mg/mL) premix infusion    Order Specific Question:   IV Access  Answer:   Peripheral    -I have reviewed the patients home medicines and have made adjustments as needed   Consultations Obtained: I requested consultation with the ICU,  and discussed lab and imaging findings as well as pertinent plan - they recommend: admission   Cardiac Monitoring: The patient was maintained on a cardiac monitor.  I personally viewed and interpreted the cardiac monitored which showed an underlying rhythm of: atrial fibrillation  Social Determinants of Health:  Diagnosis or treatment significantly limited by social determinants of health: lives alone   Reevaluation: After the interventions noted above, I reevaluated the patient and found that their symptoms have improved  Co morbidities that complicate the patient evaluation  Past Medical History:  Diagnosis Date   Arthritis    BPH (benign prostatic hyperplasia)    CAD S/P percutaneous coronary angioplasty 12/2009; 08/2010   a) 6/'22 - NSTEMI: PCI to LAD: Promus Element 2.5 mm x 15 mm DES; b) Cath for Angina: Prox LAD 60-70% pre-stent with FFR 0.82, 90% RVM -- Med Rx, EF 50-55%   Essential  hypertension    Hyperlipidemia with target LDL less than 70    Statin intolerance   Non-ST elevation MI (NSTEMI) ([place]) June 2011   PCI to LAD   Pneumonia    DEC 2016  [place] WITH ANTIBIOTIC   Type 2 diabetes mellitus without complications ([place])       Dispostion: Disposition decision including need for hospitalization was considered, and patient admitted to the hospital.    Final Clinical Impression(s) / ED Diagnoses Final diagnoses:  Shock ([place])  Hypothermia due to cold environment  Pneumonia due to infectious organism, unspecified laterality, unspecified part of lung  Skin tear of left elbow without complication, initial encounter  Acute kidney injury ([place])     This chart was dictated using voice recognition software.  Despite best efforts to proofread,  errors can occur which can change the documentation meaning.    Cristie Hem, MD 10/08/2022 [phone]

## 2022-10-11 NOTE — Progress Notes (Signed)
An USGPIV (ultrasound guided PIV) has been placed for short-term vasopressor infusion. A correctly placed ivWatch must be used when administering Vasopressors. Should this treatment be needed beyond 72 hours, central line access should be obtained.  It will be the responsibility of the bedside nurse to follow best practice to prevent extravasations.  HS Tavian Callander RN 

## 2022-10-11 NOTE — ED Notes (Signed)
RN performed oral suction for secretions

## 2022-10-12 DIAGNOSIS — J690 Pneumonitis due to inhalation of food and vomit: Secondary | ICD-10-CM | POA: Diagnosis not present

## 2022-10-12 LAB — BASIC METABOLIC PANEL
Anion gap: 16 — ABNORMAL HIGH (ref 5–15)
BUN: 26 mg/dL — ABNORMAL HIGH (ref 8–23)
CO2: 16 mmol/L — ABNORMAL LOW (ref 22–32)
Calcium: 8.3 mg/dL — ABNORMAL LOW (ref 8.9–10.3)
Chloride: 101 mmol/L (ref 98–111)
Creatinine, Ser: 1.04 mg/dL (ref 0.61–1.24)
GFR, Estimated: >60 mL/min (ref >60)
Glucose, Bld: 107 mg/dL — ABNORMAL HIGH (ref 70–99)
Potassium: 3.9 mmol/L (ref 3.5–5.1)
Sodium: 133 mmol/L — ABNORMAL LOW (ref 135–145)

## 2022-10-12 LAB — CBC
HCT: 37.8 % — ABNORMAL LOW (ref 39.0–52.0)
Hemoglobin: 13.1 g/dL (ref 13.0–17.0)
MCH: 31.6 pg (ref 26.0–34.0)
MCHC: 34.7 g/dL (ref 30.0–36.0)
MCV: 91.1 fL (ref 80.0–100.0)
Platelets: 198 10*3/uL (ref 150–400)
RBC: 4.15 MIL/uL — ABNORMAL LOW (ref 4.22–5.81)
RDW: 13.1 % (ref 11.5–15.5)
WBC: 15.7 10*3/uL — ABNORMAL HIGH (ref 4.0–10.5)
nRBC: 0 % (ref 0.0–0.2)

## 2022-10-12 LAB — GLUCOSE, CAPILLARY
Glucose-Capillary: 116 mg/dL — ABNORMAL HIGH (ref 70–99)
Glucose-Capillary: 111 mg/dL — ABNORMAL HIGH (ref 70–99)
Glucose-Capillary: 106 mg/dL — ABNORMAL HIGH (ref 70–99)
Glucose-Capillary: 119 mg/dL — ABNORMAL HIGH (ref 70–99)

## 2022-10-12 LAB — CK: Total CK: 5842 U/L — ABNORMAL HIGH (ref 49–397)

## 2022-10-12 MED ORDER — SODIUM CHLORIDE 0.9 % IV SOLN
2.0000 g | INTRAVENOUS | Status: DC
  Administered 2022-10-12 – 2022-10-14 (×3): 2 g via INTRAVENOUS
  Filled 2022-10-12 (×3): qty 20

## 2022-10-12 MED ORDER — LACTATED RINGERS IV SOLN: INTRAVENOUS | Status: AC

## 2022-10-12 NOTE — H&P (Addendum)
   NAME:  David [name], MRN:  [id number], DOB:  1935/10/30, LOS: 1 ADMISSION DATE:  10/07/2022, CONSULTATION DATE:  10/01/2022 REFERRING MD:  Dr. Truett Mainland, ER, CHIEF COMPLAINT:  Hypothermia   History of Present Illness:  87 yo male was found by family friend at 59 am on 3/23 outside his home after falling.  Last know contact was 5 pm on 3/22.  Noted to have temp 43F, lactic acid 3.8, CPK 5984.  CXR showed bilateral infiltrates Lt > Rt.  Started on ABx, pressors, and IV fluids.  PCCM asked to admit to ICU.  Pertinent  Medical History  Arthritis, BPH, CAD, HTN, HLD, DM type 2, PNA, Dementia  Significant Hospital Events: Including procedures, antibiotic start and stop dates in addition to other pertinent events   3/23 Admit  Interim History / Subjective:  See above.  Objective   Blood pressure 106/73, pulse 93, temperature 98.4 F (36.9 C), temperature source Oral, resp. rate 19, height 5\' 10"  (1.778 m), weight 75.8 kg, SpO2 96 %.        Intake/Output Summary (Last 24 hours) at 10/12/2022 1207 Last data filed at 10/12/2022 0600 Gross per 24 hour  Intake 2369.3 ml  Output 450 ml  Net 1919.3 ml   Filed Weights   10/01/2022 1136  Weight: 75.8 kg    Examination:  General - alert Eyes - pupils reactive ENT - no sinus tenderness, no stridor Cardiac - regular rate/rhythm, no murmur Chest - b/l crackles Abdomen - soft, non tender, + bowel sounds Extremities - no cyanosis, clubbing, or edema Skin - areas of ecchymosis Neuro - fidgety, follows simple commands  Ancillary tests personally reviewed:   Na 133 Creatinine 1.04 WBC 15.7 CXR consistent with aspiration.   Assessment & Plan:   Acute hypoxic respiratory failure from aspiration pneumonitis. Now on room air. Cause of fall unclear but nothing obvious.  - Complete 5 days of antibiotics for aspiration.  - f/u culture results - Continue telemetry to rule out obvious arrhythmia as cause of possible syncope, but found awake  and dementia would be relative contraindication to PPM  Hypothermia from environmental exposure. Now resolved   Shock from sepsis and hypovolemia - now off NE since last evening - continue IV fluids, as not cleared to swallow by SLP  AKI from sepsis and hypovolemia resolving Hyperkalemia. Rhabdomyolysis. - baseline creatinine 0.7 from 09/10/22 - continue IV fluid - monitor renal fx, urine outpt  Hx of dementia- this is likely the underlying cause.  - will need assessment for home assistance prior to discharge. Currently seemingly unsafe discharge.  - palliative care consultation as [place] discussion, MOLST form is appropriate.   Hx of HTN, HLD, CAD s/p DES, dilated CM, LBBB. - monitor on telemetry  DM type 2 poorly controlled with hyperglycemia. - SSI  Dysphagia. - speech therapy assessment  Best Practice (right click and "Reselect all SmartList Selections" daily)   Diet/type: NPO sips only.  DVT prophylaxis: lovenox GI prophylaxis: N/A Lines: N/A Foley:  N/A Code Status:  full code Last date of multidisciplinary goals of care discussion [updated family at bedside]  Kipp Brood, MD [facility] ICU Physician [place]  Pager: [phone] Or Epic Secure Chat After hours: [phone].  10/12/2022, 12:22 PM      10/12/2022, 12:07 PM

## 2022-10-12 NOTE — Progress Notes (Deleted)
Elink notified of loss of PIV access. IV team unable to put in IV, please see previous documentation/notes for further information.

## 2022-10-12 NOTE — Evaluation (Signed)
Clinical/Bedside Swallow Evaluation Patient Details  Name: David [name] MRN: [id number] Date of Birth: 1936-03-10  Today's Date: 10/12/2022 Time: SLP Start Time (ACUTE ONLY): 0856 SLP Stop Time (ACUTE ONLY): 0920 SLP Time Calculation (min) (ACUTE ONLY): 24 min  Past Medical History:  Past Medical History:  Diagnosis Date   Arthritis    BPH (benign prostatic hyperplasia)    CAD S/P percutaneous coronary angioplasty 12/2009; 08/2010   a) 6/'22 - NSTEMI: PCI to LAD: Promus Element 2.5 mm x 15 mm DES; b) Cath for Angina: Prox LAD 60-70% pre-stent with FFR 0.82, 90% RVM -- Med Rx, EF 50-55%   Essential hypertension    Hyperlipidemia with target LDL less than 70    Statin intolerance   Non-ST elevation MI (NSTEMI) [facility]) June 2011   PCI to LAD   Pneumonia    DEC 2016  TX WITH ANTIBIOTIC   Type 2 diabetes mellitus without complications [facility])    Past Surgical History:  Past Surgical History:  Procedure Laterality Date   CARDIAC CATHETERIZATION  12/2009   Proximal LAD stenosis followed by a significant 80-90% distal stenosis   CARDIAC CATHETERIZATION  February 2012    90% ostial RV marginal branch; 60-70% proximal LAD with widely patent distal stent. FFR 0.82; medical therapy   CATARACT EXTRACTION W/ INTRAOCULAR LENS  IMPLANT, BILATERAL     CORONARY ANGIOPLASTY  12/2009   PTCA to proximal LAD; PCI with Promus Element DES stent  2.5 mm x 15 mm  distalLAD - .for non-ST elevation MI   DENTAL SURGERY     LUMBAR LAMINECTOMY/DECOMPRESSION MICRODISCECTOMY N/A 08/09/2015   Procedure: Lumbar Four-Five, Lumbar Five- Sacral One Decompressive Lumbar Laminectomy with Resection of Synovial Cyst;  Surgeon: Erline Levine, MD;  Location: [place] NEURO ORS;  Service: Neurosurgery;  Laterality: N/A;  L4 to S1 Decompressive Lumbar Laminectomy   LUMBAR LAMINECTOMY/DECOMPRESSION MICRODISCECTOMY Right 01/21/2016   Procedure: Redo Right Lumbar Five-Sacral One Laminectomy for synovial cyst;  Surgeon: Erline Levine, MD;   Location: [place] NEURO ORS;  Service: Neurosurgery;  Laterality: Right;  right   LUMBAR LAMINECTOMY/DECOMPRESSION MICRODISCECTOMY Right 10/08/2017   Procedure: Right Lumbar three-four Laminectomy for synovial cyst;  Surgeon: Erline Levine, MD;  Location: [place];  Service: Neurosurgery;  Laterality: Right;   NASAL FRACTURE SURGERY     TRANSTHORACIC ECHOCARDIOGRAM  June 2011   - (EF not reported) Moderately dilated LV; moderate hypokinesis of anteroseptal and anterior wall consistent with MI. Grade 1 diastolic dysfunction. Mild to moderately dilated LA   VASECTOMY     HPI:  87 yo male was found by family friend at 5 am on 3/23 outside his home after falling.  Last know contact was 5 pm on 3/22.  Noted to have temp 62F, lactic acid 3.8, CPK 5984.  CXR showed bilateral infiltrates Lt > Rt.  Started on ABx, pressors, and IV fluids.  Hx of dementia;lives at home with wife.  ST consulted for BSE to assess swallow function.    Assessment / Plan / Recommendation  Clinical Impression  Pt seen for clinical swallowing evaluation with pharyngoesophageal dysphagia suspected d/t delayed cough with all consistencies, regurgitation with solids/puree and multiple sub-swallows noted with solids/puree and larger bolus of thin liquids.  Intermittent cough observed with intake of thin/solids with pt stating "I cough at home and throw up occasionally when eating" per report.  Pt with baseline dementia and oriented to self/situation only this date with no family available during assessment, so unsure of accuracy of report.  Recommend  continue NPO with ice chips and sips of water with nursing/staff until objective assessment completed.  Pt would benefit from NPO status  prior to completion of objective assessment to ensure optimal swallow observed during MBS. Thank you for this consult. SLP Visit Diagnosis: Dysphagia, pharyngoesophageal phase (R13.14)    Aspiration Risk  Moderate aspiration risk    Diet Recommendation    NPO  Medication Administration: Crushed with puree    Other  Recommendations Recommended Consults: Consider esophageal assessment;Other (Comment) (MBS) Oral Care Recommendations: Oral care QID;Oral care prior to ice chip/H20;Staff/trained caregiver to provide oral care (or assist pt with independent completion of oral care)    Recommendations for follow up therapy are one component of a multi-disciplinary discharge planning process, led by the attending physician.  Recommendations may be updated based on patient status, additional functional criteria and insurance authorization.  Follow up Recommendations Other (comment) (TBD)      Assistance Recommended at Discharge  Full supervision  Functional Status Assessment Patient has had a recent decline in their functional status and demonstrates the ability to make significant improvements in function in a reasonable and predictable amount of time.  Frequency and Duration min 2x/week  1 week       Prognosis Prognosis for improved oropharyngeal function: Good Barriers to Reach Goals: Cognitive deficits      Swallow Study   General Date of Onset: 10/17/2022 HPI: 87 yo male was found by family friend at 10 am on 3/23 outside his home after falling.  Last know contact was 5 pm on 3/22.  Noted to have temp 20F, lactic acid 3.8, CPK 5984.  CXR showed bilateral infiltrates Lt > Rt.  Started on ABx, pressors, and IV fluids.  Hx of dementia;lives at home with wife.  ST consulted for BSE to assess swallow function. Type of Study: Bedside Swallow Evaluation Previous Swallow Assessment: n/a Diet Prior to this Study: NPO Temperature Spikes Noted: No Respiratory Status: Nasal cannula (5L) History of Recent Intubation: No Behavior/Cognition: Alert;Confused;Impulsive;Distractible Oral Cavity Assessment: Dry Oral Care Completed by SLP: Other (Comment) (pt completed with guidance) Oral Cavity - Dentition: Adequate natural dentition;Missing  dentition Vision: Functional for self-feeding Self-Feeding Abilities: Needs assist Patient Positioning: Upright in bed Baseline Vocal Quality: Hoarse Volitional Cough: Congested Volitional Swallow: Unable to elicit    Oral/Motor/Sensory Function Overall Oral Motor/Sensory Function: Generalized oral weakness   Ice Chips Ice chips: Impaired Presentation: Spoon Pharyngeal Phase Impairments: Cough - Delayed   Thin Liquid Thin Liquid: Impaired Presentation: Spoon;Cup Pharyngeal  Phase Impairments: Cough - Delayed;Cough - Immediate    Nectar Thick Nectar Thick Liquid: Not tested   Honey Thick Honey Thick Liquid: Not tested   Puree Puree: Impaired Presentation: Spoon Pharyngeal Phase Impairments: Cough - Delayed Other Comments: expelled from oral cavity after swallow d/t delayed cough   Solid     Solid: Impaired Presentation: Spoon Pharyngeal Phase Impairments: Multiple swallows;Cough - Delayed Other Comments: expelled from oral cavity      Pat Pratham Cassatt,M.S., CCC-SLP 10/12/2022,10:11 AM

## 2022-10-13 ENCOUNTER — Inpatient Hospital Stay (HOSPITAL_COMMUNITY): Payer: Medicare Other

## 2022-10-13 DIAGNOSIS — Z515 Encounter for palliative care: Secondary | ICD-10-CM

## 2022-10-13 DIAGNOSIS — R579 Shock, unspecified: Secondary | ICD-10-CM | POA: Diagnosis not present

## 2022-10-13 DIAGNOSIS — F0390 Unspecified dementia without behavioral disturbance: Secondary | ICD-10-CM

## 2022-10-13 DIAGNOSIS — T68XXXA Hypothermia, initial encounter: Secondary | ICD-10-CM

## 2022-10-13 DIAGNOSIS — Z7189 Other specified counseling: Secondary | ICD-10-CM

## 2022-10-13 DIAGNOSIS — N179 Acute kidney failure, unspecified: Secondary | ICD-10-CM

## 2022-10-13 DIAGNOSIS — R1310 Dysphagia, unspecified: Secondary | ICD-10-CM

## 2022-10-13 DIAGNOSIS — Z66 Do not resuscitate: Secondary | ICD-10-CM | POA: Diagnosis not present

## 2022-10-13 DIAGNOSIS — J189 Pneumonia, unspecified organism: Secondary | ICD-10-CM

## 2022-10-13 LAB — GLUCOSE, CAPILLARY
Glucose-Capillary: 123 mg/dL — ABNORMAL HIGH (ref 70–99)
Glucose-Capillary: 124 mg/dL — ABNORMAL HIGH (ref 70–99)
Glucose-Capillary: 135 mg/dL — ABNORMAL HIGH (ref 70–99)
Glucose-Capillary: 144 mg/dL — ABNORMAL HIGH (ref 70–99)
Glucose-Capillary: 149 mg/dL — ABNORMAL HIGH (ref 70–99)
Glucose-Capillary: 151 mg/dL — ABNORMAL HIGH (ref 70–99)

## 2022-10-13 LAB — CBC
HCT: 41.3 % (ref 39.0–52.0)
Hemoglobin: 14.3 g/dL (ref 13.0–17.0)
MCH: 31.6 pg (ref 26.0–34.0)
MCHC: 34.6 g/dL (ref 30.0–36.0)
MCV: 91.2 fL (ref 80.0–100.0)
Platelets: 214 10*3/uL (ref 150–400)
RBC: 4.53 MIL/uL (ref 4.22–5.81)
RDW: 13.4 % (ref 11.5–15.5)
WBC: 12.9 10*3/uL — ABNORMAL HIGH (ref 4.0–10.5)
nRBC: 0 % (ref 0.0–0.2)

## 2022-10-13 LAB — MAGNESIUM: Magnesium: 1.9 mg/dL (ref 1.7–2.4)

## 2022-10-13 LAB — BASIC METABOLIC PANEL
Anion gap: 14 (ref 5–15)
BUN: 24 mg/dL — ABNORMAL HIGH (ref 8–23)
CO2: 16 mmol/L — ABNORMAL LOW (ref 22–32)
Calcium: 9.1 mg/dL (ref 8.9–10.3)
Chloride: 106 mmol/L (ref 98–111)
Creatinine, Ser: 1 mg/dL (ref 0.61–1.24)
GFR, Estimated: >60 mL/min (ref >60)
Glucose, Bld: 133 mg/dL — ABNORMAL HIGH (ref 70–99)
Potassium: 4.1 mmol/L (ref 3.5–5.1)
Sodium: 136 mmol/L (ref 135–145)

## 2022-10-13 LAB — PHOSPHORUS: Phosphorus: 2.6 mg/dL (ref 2.5–4.6)

## 2022-10-13 LAB — PATHOLOGIST SMEAR REVIEW

## 2022-10-13 MED ORDER — LACTATED RINGERS IV SOLN: INTRAVENOUS | Status: DC

## 2022-10-13 MED ORDER — KETOROLAC TROMETHAMINE 15 MG/ML IJ SOLN
7.5000 mg | Freq: Once | INTRAMUSCULAR | Status: AC
  Administered 2022-10-13: 7.5 mg via INTRAVENOUS
  Filled 2022-10-13: qty 1

## 2022-10-13 MED ORDER — METOPROLOL TARTRATE 5 MG/5ML IV SOLN
5.0000 mg | Freq: Four times a day (QID) | INTRAVENOUS | Status: DC
  Administered 2022-10-13 – 2022-10-15 (×10): 5 mg via INTRAVENOUS
  Filled 2022-10-13 (×10): qty 5

## 2022-10-13 MED ORDER — HALOPERIDOL LACTATE 5 MG/ML IJ SOLN
5.0000 mg | Freq: Once | INTRAMUSCULAR | Status: AC | PRN
  Administered 2022-10-14: 5 mg via INTRAMUSCULAR
  Filled 2022-10-13: qty 1

## 2022-10-13 MED ORDER — NICOTINE 21 MG/24HR TD PT24
21.0000 mg | MEDICATED_PATCH | Freq: Every day | TRANSDERMAL | Status: DC
  Administered 2022-10-13 – 2022-10-15 (×3): 21 mg via TRANSDERMAL
  Filled 2022-10-13 (×3): qty 1

## 2022-10-13 NOTE — Consult Note (Signed)
Modified Barium Swallow Study  Patient Details  Name: David [name] MRN: [id number] Date of Birth: 1936/07/01  Today's Date: 10/13/2022  Modified Barium Swallow completed.  Full report located under Chart Review in the Imaging Section.  History of Present Illness   Pt exhibited an Mild-moderate oropharyngeal dysphagia c/b slight oral holding with trace residue lining the oral structures and a swallow initiation varying from triggering at the valleculae-pyriform sinuses. Pharyngeal phase of the swallow was moderately delayed with decreased tongue base retraction,reduced pharyngeal stripping wave, partial epiglottic inversion/movement, partial anterior hyoid movement and incomplete laryngeal vestibule closure noted with most consistencies d/t residue remaining in valleculae/pyriform sinuses.  Compensatory strategies utilized included: chin tuck, repetitive dry swallows, liquid wash and effortful swallow which did not remediate imoderate vallecular/pyriform sinus residue.  Pt aspirated thin via cup sip (PAS 4) with cough clearing laryngeal vestibule.  Nectar thick liquids did not result in aspiration when utilized in isolation (PAS 2), but when used as a liquid wash, the nectar thick liquids yielded (PAS 4).  Pt uses "hocking" material up during meals and expels from oral cavity and/or coughs to clear the airway. Esophageal component cannot be ruled out as this was difficult to assess d/t pt positioning.  Due to moderate risk for aspiration, recommend initiating a conservative diet of nectar-thickened liquids ONLY using compensatory swallowing strategies and FULL supervision with meals.  Dysphagia tx recommended to remediate swallow function addressing above deficits in acute setting.  Factors that may increase risk of adverse event in presence of aspiration ([place] 2021): Factors that may increase risk of adverse event in presence of aspiration ([place] 2021): Poor general health  and/or compromised immunity; Respiratory or GI disease; Reduced cognitive function; Frail or deconditioned; Dependence for feeding and/or oral hygiene; Frequent aspiration of large volumes           Pat Rainee Sweatt,M.S., CCC-SLP 10/13/2022,5:23 PM

## 2022-10-13 NOTE — Progress Notes (Signed)
Bladder scan at 1430 = 365 ml I/O cath = 350 ml Post I/o cath residual = 0 ml   MD aware.

## 2022-10-13 NOTE — Progress Notes (Signed)
[facility]  PROGRESS NOTE  David [name] [id number] DOB: [birth year] DOA: 09/27/2022 PCP: [name], MD   Brief HPI:   87 year old male with history of dementia, diabetes mellitus type 2, hypertension, CAD, BPH presented to the ED after he was found by family friend at 35 AM on 3/23 outside his home.  Patient apparently fell and was outside his house all night.  Patient was found to be hypothermic with temperature 91 degrees Fahrenheit.  CPK 5984.  Chest x-ray showed bilateral infiltrates left more than right.  Patient was started on antibiotics pressors and IV fluids and was in the ICU.  Now patient was transferred out of ICU. TRH resumed care on 10/13/2022.    Subjective   Patient seen and examined, denies any complaints.  Mental status has improved.   Assessment/Plan:   Acute hypoxemic respiratory failure -Likely from aspiration pneumonitis; back to room air -Continue ceftriaxone. -WBC is down to 12.9  Hypothermia from environmental exposure -Resolved  Shock from sepsis and hypovolemia -Initially required norepinephrine, has been weaned off  Dysphagia -Swallow evaluation obtained -Started on nectar thick liquid diet today  Rhabdomyolysis -CPK elevated to 5984 -Continue IV fluids -Follow CPK level in a.m.  Acute kidney injury from hypovolemia -Resolved with IV fluids  Dementia -Patient has underlying dementia -Perative care consulted for goals of care discussion  Diabetes mellitus type 2 -Continue sliding scale insulin NovoLog -CBG well-controlled    Medications     arformoterol  15 mcg Nebulization BID   budesonide (PULMICORT) nebulizer solution  0.5 mg Nebulization BID   Chlorhexidine Gluconate Cloth  6 each Topical Daily   heparin injection (subcutaneous)  5,000 Units Subcutaneous Q8H   insulin aspart  0-15 Units Subcutaneous Q4H   metoprolol tartrate  5 mg Intravenous Q6H   nicotine  21 mg Transdermal Daily   mouth rinse  15 mL  Mouth Rinse 4 times per day   revefenacin  175 mcg Nebulization Daily     Data Reviewed:   CBG:  Recent Labs  Lab 10/13/22 0039 10/13/22 0409 10/13/22 0732 10/13/22 1112 10/13/22 1617  GLUCAP 124* 135* 123* 144* 151*    SpO2: 94 % O2 Flow Rate (L/min): 0 L/min FiO2 (%): 21 %    Vitals:   10/13/22 0925 10/13/22 0929 10/13/22 1017 10/13/22 1620  BP: (!) 127/100 132/83 (!) 124/99 128/88  Pulse: (!) 122 (!) 121  (!) 124  Resp: 18  18 18   Temp: 98 F (36.7 C)   97.9 F (36.6 C)  TempSrc: Oral   Oral  SpO2: 94%   94%  Weight:      Height:          Data Reviewed:  Basic Metabolic Panel: Recent Labs  Lab 10/17/2022 1140 10/03/2022 2039 10/12/22 0055 10/13/22 0633  NA 131* 134* 133* 136  K 5.2* 3.9 3.9 4.1  CL 95* 102 101 106  CO2 21* 16* 16* 16*  GLUCOSE 116* 106* 107* 133*  BUN 24* 26* 26* 24*  CREATININE 1.36* 1.18 1.04 1.00  CALCIUM 9.0 8.4* 8.3* 9.1  MG  --   --   --  1.9  PHOS  --   --   --  2.6    CBC: Recent Labs  Lab 10/10/2022 1140 10/12/22 0055 10/13/22 0633  WBC 28.7* 15.7* 12.9*  NEUTROABS 23.9*  --   --   HGB 15.2 13.1 14.3  HCT 44.4 37.8* 41.3  MCV 94.3 91.1 91.2  PLT 288 198 214  LFT Recent Labs  Lab 10/10/2022 1140  AST 121*  ALT 33  ALKPHOS 41  BILITOT 1.4*  PROT 6.3*  ALBUMIN 3.8     Antibiotics: Anti-infectives (From admission, onward)    Start     Dose/Rate Route Frequency Ordered Stop   10/12/22 1300  vancomycin (VANCOCIN) IVPB 1000 mg/200 mL premix  Status:  Discontinued        1,000 mg 200 mL/hr over 60 Minutes Intravenous Every 24 hours 10/18/2022 1332 10/18/2022 1442   10/12/22 1100  cefTRIAXone (ROCEPHIN) 2 g in sodium chloride 0.9 % 100 mL IVPB        2 g 200 mL/hr over 30 Minutes Intravenous Every 24 hours 10/12/22 1009 10/17/22 1059   10/12/22 0100  ceFEPIme (MAXIPIME) 2 g in sodium chloride 0.9 % 100 mL IVPB  Status:  Discontinued        2 g 200 mL/hr over 30 Minutes Intravenous Every 12 hours 10/12/2022 1332  10/10/2022 1442   09/30/2022 1300  ceFEPIme (MAXIPIME) 2 g in sodium chloride 0.9 % 100 mL IVPB        2 g 200 mL/hr over 30 Minutes Intravenous  Once 10/09/2022 1249 10/02/2022 1736   09/20/2022 1300  metroNIDAZOLE (FLAGYL) IVPB 500 mg        500 mg 100 mL/hr over 60 Minutes Intravenous  Once 10/13/2022 1249 10/09/2022 1557   09/23/2022 1300  vancomycin (VANCOCIN) IVPB 1000 mg/200 mL premix  Status:  Discontinued        1,000 mg 200 mL/hr over 60 Minutes Intravenous  Once 09/26/2022 1249 09/29/2022 1257   10/15/2022 1300  vancomycin (VANCOREADY) IVPB 1500 mg/300 mL        1,500 mg 150 mL/hr over 120 Minutes Intravenous  Once 09/27/2022 1257 09/25/2022 1716        DVT prophylaxis: Heparin  Code Status: DNR  Family Communication: Discussed with patient's daughter and son at bedside   CONSULTS    Objective    Physical Examination:   General-appears in no acute distress Heart-S1-S2, regular, no murmur auscultated Lungs-clear to auscultation bilaterally, no wheezing or crackles auscultated Abdomen-soft, nontender, no organomegaly Extremities-no edema in the lower extremities Neuro-alert, oriented x3, no focal deficit noted   Status is: Inpatient:             [name]   [facility] If 7PM-7AM, please contact night-coverage at [email or web address], Office  [phone]   10/13/2022, 5:10 PM  LOS: 2 days

## 2022-10-13 NOTE — Progress Notes (Signed)
Pt in [place]. Went to pt and pt woke up and only c/o of a headache. Called critical care team orders placed.

## 2022-10-13 NOTE — Progress Notes (Addendum)
[facility] Progress Note Patient Name: David [name] DOB: February 16, 1936 MRN: [id number]   Date of Service  10/13/2022  HPI/Events of Note  Notified of 30 sec run of VT.    Pt back in rapid atrial fibrillation with rate in the 120s as seen on EKG.   BP 144/102, HR 120, RR 18, O2 sats on RA.    eICU Interventions  Start on lopressor 5mg  IV q6hrs.  Check BMP, ca, mg, phos and CBC.      Intervention Category Intermediate Interventions: Arrhythmia - evaluation and management  Elsie Lincoln 10/13/2022, 3:34 AM

## 2022-10-13 NOTE — Consult Note (Signed)
Palliative Care Consult Note                                  Date: 10/13/2022   Patient Name: David [name]  DOB: 07/30/1935  MRN: [id number]  Age / Sex: 87 y.o., male  PCP: David Pinto, MD Referring Physician: [name], MD  Reason for Consultation: Establishing goals of care  HPI/Patient Profile: 87 y.o. male  with past medical history of arthritis, BPH, CAD, HTN, HLD, DM type 2, PNA, Dementia who presented to the emergency department after he was found in the yard by family friend after falling off the front porch.  He was admitted on 09/20/2022 with acute hypoxic respiratory failure from aspiration pneumonitis, hypothermia from environmental exposure, shock from sepsis and hypovolemia, AKI from sepsis and hypovolemia, rhabdomyolysis, history of dementia, dysphagia, and others.   PMT was consulted for [place] conversations.  Past Medical History:  Diagnosis Date   Arthritis    BPH (benign prostatic hyperplasia)    CAD S/P percutaneous coronary angioplasty 12/2009; 08/2010   a) 6/'22 - NSTEMI: PCI to LAD: Promus Element 2.5 mm x 15 mm DES; b) Cath for Angina: Prox LAD 60-70% pre-stent with FFR 0.82, 90% RVM -- Med Rx, EF 50-55%   Essential hypertension    Hyperlipidemia with target LDL less than 70    Statin intolerance   Non-ST elevation MI (NSTEMI) [facility]) June 2011   PCI to LAD   Pneumonia    DEC 2016  [place] WITH ANTIBIOTIC   Type 2 diabetes mellitus without complications (HCC)     Subjective:   This NP David [name] reviewed medical records, received report from team, assessed the patient and then meet at the patient's bedside to discuss diagnosis, prognosis, GOC, EOL wishes disposition and options.  I met with the patient at the bedside.  Also present with his spouse David [name] and Environmental consultant.   Concept of Palliative Care was introduced as specialized medical care for people and their families living  with serious illness.  If focuses on providing relief from the symptoms and stress of a serious illness.  The goal is to improve quality of life for both the patient and the family. Values and goals of care important to patient and family were attempted to be elicited.  Created space and opportunity for patient  and family to explore thoughts and feelings regarding current medical situation   Natural trajectory and current clinical status were discussed. Questions and concerns addressed. Patient  encouraged to call with questions or concerns.    Patient/Family Understanding of Illness: They understand he went outside with a walker and fell off the edge of the deck which does not have a railing.  He was found the next day and was brought to the hospital.  He recently had shots in his back secondary to back pain and leg problems.  Since then he has refused further back surgery.  He is also refused continued cardiology visits.  Patient's family notes significant decline over the past month.  He failed his initial swallow eval but another one was completed today and results are pending.  Life Review: The patient has been married for 34 years, but has not his eventual wife for 70 years.  He previously worked as a Microbiologist industries.  He enjoys hunting, fishing, competitive shooting.  Goals: After discussion below, elected NO feeding tube.  Otherwise  treated treatable.  Further [place] discussions pending MBSS results.  Today's Discussion: In addition to discussions described above we had substantial discussions on various topics.  We discussed his recent refusal of certain aspects of care including refusing to go back to her cardiologist, refusing further back surgeries.  He states "everything else is falling apart so what is the point?"  We discussed that with deteriorating health it is appropriate to set limitations.  We had a discussion about CODE STATUS.  The patient's wife states  that he previously has voiced preference for no resuscitation, until recently when to try to get him to sign the DNR paperwork and he would not.  We discussed and explained cardiopulmonary resuscitation, the patient said "if it is my time to let me go."  Wife states that she personally has a DNR and she feels that this is in line with his wishes when he has capacity.  We also had a discussion about other care options.  We discussed antibiotics, fluids, feeding tubes.  They are agreeable to antibiotics and fluids, if indicated.  However, the patient would not want a feeding tube because "he likes to eat and if he can eat it then he does not want it."  Based on the information discussed we completed a MOST form (see below and in ACP).  We discussed that he is likely to recover from his current acute illness.  However, with noted progressive decline over the past days, weeks, months with his dementia and understanding that dementia is progressive and irreversible, we need to think about the long-term plan.  Family is in agreement.  We discussed that, given he would not want a feeding tube, depending on the results of his MBSS that may limit [place] even further.  We agreed to meet again tomorrow at 2:00 with data from [facility] and speech therapy consult to discuss possible hospice versus outpatient palliative care at discharge.  The patient's wife seems to be leaning that if he cannot eat and drink that hospice would be more appropriate, which I agree.  I provided emotional and general support through therapeutic listening, empathy, sharing of stories, and other techniques. I answered all questions and addressed all concerns to the best of my ability.  Review of Systems  Respiratory:  Negative for chest tightness and shortness of breath.   Cardiovascular:  Negative for chest pain.  Gastrointestinal:  Negative for abdominal pain, nausea and vomiting.    Objective:   Primary Diagnoses: Present on Admission:   Aspiration pneumonitis [facility])   Physical Exam Vitals and nursing note reviewed.  Constitutional:      General: He is not in acute distress.    Appearance: He is ill-appearing.  HENT:     Head: Normocephalic and atraumatic.  Pulmonary:     Effort: Pulmonary effort is normal. No respiratory distress.  Abdominal:     General: Abdomen is flat.  Skin:    General: Skin is warm and dry.  Neurological:     Mental Status: He is alert. He is disoriented and confused.     Vital Signs:  BP 132/83 (BP Location: Right Arm)   Pulse (!) 121   Temp 98 F (36.7 C) (Oral)   Resp 18   Ht 5\' 10"  (1.778 m)   Wt 78 kg   SpO2 94%   BMI 24.67 kg/m   Palliative Assessment/Data: 10% (currently NPO)    Advanced Care Planning:   Existing Vynca/ACP Documentation: MOST form signed 10/13/2022 (today)  Primary  Decision Maker: NEXT OF KIN  Code Status/Advance Care Planning: DNR  A discussion was had today regarding advanced directives. Concepts specific to code status, artifical feeding and hydration, continued IV antibiotics and rehospitalization was had.  The difference between a aggressive medical intervention path and a palliative comfort care path for this patient at this time was had. The MOST form was introduced and discussed.  A completed copy of the MOST form has been loaded into ACP/Vynca.  See elections below.   I completed a MOST form today. The patient and family outlined their wishes for the following treatment decisions:  Cardiopulmonary Resuscitation: Do Not Attempt Resuscitation (DNR/No CPR)  Medical Interventions: Limited Additional Interventions: Use medical treatment, IV fluids and cardiac monitoring as indicated, DO NOT USE intubation or mechanical ventilation. May consider use of less invasive airway support such as BiPAP or CPAP. Also provide comfort measures. Transfer to the hospital if indicated. Avoid intensive care.   Antibiotics: Antibiotics if indicated  IV Fluids: IV  fluids if indicated  Feeding Tube: No feeding tube     Decisions/Changes to ACP: Changed to DNR MOST form completed  Assessment & Plan:   Impression: 87 year old male with chronic comorbidities and acute presentations as described above.  The genesis of his fall and subsequent rhabdo with AKI as well as hypothermia from environmental exposure seems to be related to his dementia.  Family notes a progressive decline in the past month related to his dementia.  Also noted dysphagia status post failed bedside swallow and MBSS completed today, pending results.  Family (with patient participation in conversation when able) elected DNR and no feeding tube.  Plan for further [place] conversations tomorrow at 2:00 after MBSS results and SLP eval completed to determine elements of the long-term plan (hospice versus outpatient palliative care).  Overall prognosis poor.  SUMMARY OF RECOMMENDATIONS   DNR No feeding tube MOST form completed Continue to treat the treatable Plan family meeting tomorrow at 2:00 PMT will continue to follow  Symptom Management:  Per primary team PMT is available to assist as needed  Prognosis:  Unable to determine  Discharge Planning:  To Be Determined   Discussed with: Patient, patient's family, medical team, nursing team    Thank you for allowing [place] to participate in the care of BRASEN ICE PMT will continue to support holistically.  Time Total: 90 min  Greater than 50%  of this time was spent counseling and coordinating care related to the above assessment and plan.  Signed by: David Field, NP Palliative Medicine Team  Team Phone # [phone] (Nights/Weekends)  10/13/2022, 9:59 AM

## 2022-10-13 NOTE — Progress Notes (Signed)
TRH night cross cover note:  I was notified by RN that this patient is agitated, pulling at their telemetry leads and successfully removing his sole peripheral IV,  with these behaviors refractory to attempts at verbal redirection.  In the setting of his agitation, patient is unable to take p.o. at this time.  In the setting of associated interference with ongoing medical treatment posing potential harm to themself, I have placed order for Haldol 5 mg IM x 1 dose prn for agitation.   Update: Patient continues to be agitated, pulling at lines, attempting to get out of bed, with these behaviors refractory to aforementioned dose of prn with IM Haldol as well as continued attempts at verbal redirection.   It appears that he is here with acute hypoxic respiratory failure in the setting of suspected aspiration pneumonitis, and undergoing antibiotic therapy.  While supplemental oxygen was able to be weaned and discontinued earlier yesterday, it appears that he is now requiring supplemental oxygen again, with most recent oxygen saturations dipping into the 80s.  However, in the setting of the patient's agitation, he is removing supplemental oxygen delivery.  In light of all the above, I have placed orders for soft bilateral wrist restraints as well as posey belt, and also placed order for Geodon 20 mg IM x 1 dose prn for agitation.    Update: RN conveys that patient's O2 sats into the high 80's on existing 4 L [place], and that there are now some crackles on physical exam. Not currently on ivf's. I've ordered cxr to further evaluate, along with bnp and procalcitonin level.     Babs Bertin, DO Hospitalist

## 2022-10-14 ENCOUNTER — Inpatient Hospital Stay (HOSPITAL_COMMUNITY): Payer: Medicare Other

## 2022-10-14 DIAGNOSIS — Z515 Encounter for palliative care: Secondary | ICD-10-CM | POA: Diagnosis not present

## 2022-10-14 DIAGNOSIS — Z66 Do not resuscitate: Secondary | ICD-10-CM | POA: Diagnosis not present

## 2022-10-14 DIAGNOSIS — R579 Shock, unspecified: Secondary | ICD-10-CM | POA: Diagnosis not present

## 2022-10-14 DIAGNOSIS — N179 Acute kidney failure, unspecified: Secondary | ICD-10-CM | POA: Diagnosis not present

## 2022-10-14 DIAGNOSIS — J189 Pneumonia, unspecified organism: Secondary | ICD-10-CM | POA: Diagnosis not present

## 2022-10-14 DIAGNOSIS — Z7189 Other specified counseling: Secondary | ICD-10-CM | POA: Diagnosis not present

## 2022-10-14 DIAGNOSIS — J690 Pneumonitis due to inhalation of food and vomit: Secondary | ICD-10-CM

## 2022-10-14 LAB — CBC
HCT: 44.5 % (ref 39.0–52.0)
Hemoglobin: 14.7 g/dL (ref 13.0–17.0)
MCH: 30.8 pg (ref 26.0–34.0)
MCHC: 33 g/dL (ref 30.0–36.0)
MCV: 93.3 fL (ref 80.0–100.0)
Platelets: 252 10*3/uL (ref 150–400)
RBC: 4.77 MIL/uL (ref 4.22–5.81)
RDW: 13.5 % (ref 11.5–15.5)
WBC: 15 10*3/uL — ABNORMAL HIGH (ref 4.0–10.5)
nRBC: 0 % (ref 0.0–0.2)

## 2022-10-14 LAB — GLUCOSE, CAPILLARY
Glucose-Capillary: 146 mg/dL — ABNORMAL HIGH (ref 70–99)
Glucose-Capillary: 142 mg/dL — ABNORMAL HIGH (ref 70–99)
Glucose-Capillary: 152 mg/dL — ABNORMAL HIGH (ref 70–99)
Glucose-Capillary: 138 mg/dL — ABNORMAL HIGH (ref 70–99)
Glucose-Capillary: 115 mg/dL — ABNORMAL HIGH (ref 70–99)

## 2022-10-14 LAB — COMPREHENSIVE METABOLIC PANEL
ALT: 116 U/L — ABNORMAL HIGH (ref 0–44)
AST: 204 U/L — ABNORMAL HIGH (ref 15–41)
Albumin: 3.4 g/dL — ABNORMAL LOW (ref 3.5–5.0)
Alkaline Phosphatase: 55 U/L (ref 38–126)
Anion gap: 13 (ref 5–15)
BUN: 41 mg/dL — ABNORMAL HIGH (ref 8–23)
CO2: 21 mmol/L — ABNORMAL LOW (ref 22–32)
Calcium: 9.3 mg/dL (ref 8.9–10.3)
Chloride: 107 mmol/L (ref 98–111)
Creatinine, Ser: 1.15 mg/dL (ref 0.61–1.24)
GFR, Estimated: >60 mL/min (ref >60)
Glucose, Bld: 152 mg/dL — ABNORMAL HIGH (ref 70–99)
Potassium: 4.5 mmol/L (ref 3.5–5.1)
Sodium: 141 mmol/L (ref 135–145)
Total Bilirubin: 1.6 mg/dL — ABNORMAL HIGH (ref 0.3–1.2)
Total Protein: 6.2 g/dL — ABNORMAL LOW (ref 6.5–8.1)

## 2022-10-14 LAB — CK: Total CK: 1175 U/L — ABNORMAL HIGH (ref 49–397)

## 2022-10-14 LAB — CALCIUM, IONIZED: Calcium, Ionized, Serum: 5.2 mg/dL (ref 4.5–5.6)

## 2022-10-14 LAB — PROCALCITONIN: Procalcitonin: 0.62 ng/mL

## 2022-10-14 LAB — BRAIN NATRIURETIC PEPTIDE: B Natriuretic Peptide: 2497.1 pg/mL — ABNORMAL HIGH (ref 0.0–100.0)

## 2022-10-14 MED ORDER — IPRATROPIUM-ALBUTEROL 0.5-2.5 (3) MG/3ML IN SOLN
3.0000 mL | Freq: Three times a day (TID) | RESPIRATORY_TRACT | Status: DC
  Administered 2022-10-15: 3 mL via RESPIRATORY_TRACT
  Filled 2022-10-14: qty 3

## 2022-10-14 MED ORDER — ZIPRASIDONE MESYLATE 20 MG IM SOLR
20.0000 mg | Freq: Once | INTRAMUSCULAR | Status: AC | PRN
  Administered 2022-10-15: 20 mg via INTRAMUSCULAR
  Filled 2022-10-14 (×2): qty 20

## 2022-10-14 MED ORDER — IPRATROPIUM-ALBUTEROL 0.5-2.5 (3) MG/3ML IN SOLN
3.0000 mL | Freq: Four times a day (QID) | RESPIRATORY_TRACT | Status: DC
  Administered 2022-10-14: 3 mL via RESPIRATORY_TRACT
  Filled 2022-10-14: qty 3

## 2022-10-14 MED ORDER — ALBUTEROL SULFATE (2.5 MG/3ML) 0.083% IN NEBU
2.5000 mg | INHALATION_SOLUTION | Freq: Once | RESPIRATORY_TRACT | Status: AC
  Administered 2022-10-14: 2.5 mg via RESPIRATORY_TRACT
  Filled 2022-10-14: qty 3

## 2022-10-14 MED ORDER — HYDROMORPHONE HCL 1 MG/ML IJ SOLN
1.0000 mg | INTRAMUSCULAR | Status: DC | PRN
  Administered 2022-10-14 – 2022-10-16 (×3): 1 mg via INTRAVENOUS
  Filled 2022-10-14 (×3): qty 1

## 2022-10-14 MED ORDER — FUROSEMIDE 10 MG/ML IJ SOLN
40.0000 mg | Freq: Once | INTRAMUSCULAR | Status: AC
  Administered 2022-10-14: 40 mg via INTRAVENOUS
  Filled 2022-10-14: qty 4

## 2022-10-14 MED ORDER — SODIUM CHLORIDE 0.9 % IV SOLN
3.0000 g | Freq: Three times a day (TID) | INTRAVENOUS | Status: DC
  Administered 2022-10-14 – 2022-10-15 (×3): 3 g via INTRAVENOUS
  Filled 2022-10-14 (×3): qty 8

## 2022-10-14 MED ORDER — DILTIAZEM HCL-DEXTROSE 125-5 MG/125ML-% IV SOLN (PREMIX)
5.0000 mg/h | INTRAVENOUS | Status: DC
  Administered 2022-10-14: 5 mg/h via INTRAVENOUS
  Administered 2022-10-15: 10 mg/h via INTRAVENOUS
  Filled 2022-10-14 (×2): qty 125

## 2022-10-14 MED ORDER — IPRATROPIUM-ALBUTEROL 0.5-2.5 (3) MG/3ML IN SOLN: 3.0000 mL | Freq: Four times a day (QID) | RESPIRATORY_TRACT | Status: DC

## 2022-10-14 NOTE — Progress Notes (Signed)
Speech Language Pathology Treatment: Dysphagia  Patient Details Name: David [name] MRN: [id number] DOB: 25-May-1936 Today's Date: 10/14/2022 Time: [id number] SLP Time Calculation (min) (ACUTE ONLY): 15 min  Assessment / Plan / Recommendation Clinical Impression  David [name] was seen after yesterday's MBS, results of which indicated a complicated dysphagia due to poor transition of solids through the pharynx and more likely aspiration of thin liquids.  He was recommended to start a nectar-thick liquid diet as potentially the safest of consistencies for now.  Today, he is more confused, agitated, requiring more oxygen and with crackles on exam per RN.   He responded briefly to name and allowed me to complete oral care.  Ice chips and single sips of nectar led to coughing, concerning for overt aspiration, so PO trials were discontinued.  Pt's family is meeting today with Palliative Care.  Recommending holding further POs for now. Informed Dr. Darrick Meigs and Walden Field, Palliative Care, via Secure Chat.  SLP will follow for [place].   HPI HPI: 87 yo male was found by family friend at 60 am on 3/23 outside his home after falling.  Last know contact was 5 pm on 3/22.  Noted to have temp 50F, lactic acid 3.8, CPK 5984.  CXR showed bilateral infiltrates Lt > Rt.  Started on ABx, pressors, and IV fluids.  PCCM asked to admit to ICU; CT head on 10/10/2022 indicated Chronic microvascular ischemic change and cerebral volume loss; CXR on 10/12/22 indicated Mild asymmetric interstitial opacities, left worse than right; ST consulted for BSE with pt exhibiting overt s/sx of aspiration/regurgitation with most consistencies during assessment on 10/12/22.  MBS generated to assess overall swallow function.      SLP Plan  Continue with current plan of care      Recommendations for follow up therapy are one component of a multi-disciplinary discharge planning process, led by the attending physician.  Recommendations may be updated  based on patient status, additional functional criteria and insurance authorization.    Recommendations  Diet recommendations: Other(comment) hold POs pending Palliative Care meeting today.                  Oral care QID;Oral care prior to ice chip/H20;Staff/trained caregiver to provide oral care           Continue with current plan of care   Anabell Swint L. Tivis Ringer, MA CCC/SLP Clinical Specialist - Acute Care SLP Acute Rehabilitation Services Office number [phone]   Juan Quam Laurice  10/14/2022, 12:40 PM

## 2022-10-14 NOTE — Progress Notes (Signed)
[facility]  PROGRESS NOTE  David [name] [id number] DOB: 1936/04/06 DOA: 09/24/2022 PCP: David Pinto, MD   Brief HPI:   87 year old male with history of dementia, diabetes mellitus type 2, hypertension, CAD, BPH presented to the ED after he was found by family friend at 42 AM on 3/23 outside his home.  Patient apparently fell and was outside his house all night.  Patient was found to be hypothermic with temperature 91 degrees Fahrenheit.  CPK 5984.  Chest x-ray showed bilateral infiltrates left more than right.  Patient was started on antibiotics pressors and IV fluids and was in the ICU.  Now patient was transferred out of ICU. TRH resumed care on 10/13/2022.    Subjective   Patient seen and examined, this morning patient went into acute hypoxemic respiratory failure with bilateral rhonchi.  Required nonrebreather.  Rapid response was called.   Assessment/Plan:   Acute hypoxemic respiratory failure -Likely from aspiration pneumonitis;  -Likely in setting of recurrent aspiration, fluid overload -Will give 1 dose of Lasix 40 mg IV -DuoNeb nebulizers x 1. -Continue ceftriaxone. -Will transfer to progressive care unit  Hypothermia from environmental exposure -Resolved  Shock from sepsis and hypovolemia -Initially required norepinephrine, has been weaned off  Dysphagia -Swallow evaluation obtained -Started on nectar thick liquid diet yesterday -Patient is currently n.p.o.  Rhabdomyolysis -CPK elevated to 5984 -Continue IV fluids -CPK is down to 1175  Acute kidney injury from hypovolemia -Resolved with IV fluids  Dementia -Patient has underlying dementia -Perative care consulted for goals of care discussion  Diabetes mellitus type 2 -Continue sliding scale insulin NovoLog -CBG well-controlled  Goals of care discussion -Perative care was consulted, patient made DNR.  Family to have further discussion of goals of care today regarding  nutrition.  Medications     arformoterol  15 mcg Nebulization BID   budesonide (PULMICORT) nebulizer solution  0.5 mg Nebulization BID   Chlorhexidine Gluconate Cloth  6 each Topical Daily   heparin injection (subcutaneous)  5,000 Units Subcutaneous Q8H   insulin aspart  0-15 Units Subcutaneous Q4H   metoprolol tartrate  5 mg Intravenous Q6H   nicotine  21 mg Transdermal Daily   mouth rinse  15 mL Mouth Rinse 4 times per day   revefenacin  175 mcg Nebulization Daily     Data Reviewed:   CBG:  Recent Labs  Lab 10/13/22 1112 10/13/22 1617 10/13/22 2235 10/14/22 0724 10/14/22 1105  GLUCAP 144* 151* 149* 146* 142*    SpO2: 96 % O2 Flow Rate (L/min): 2 L/min FiO2 (%): 21 %    Vitals:   10/14/22 0824 10/14/22 1230 10/14/22 1358 10/14/22 1430  BP:  127/89 (!) 137/92   Pulse:  90 92   Resp:  (!) 29 20   Temp:  98.3 F (36.8 C) 98.8 F (37.1 C)   TempSrc:  Oral Oral   SpO2: 97% 94% 97% 96%  Weight:      Height:          Data Reviewed:  Basic Metabolic Panel: Recent Labs  Lab 10/15/2022 1140 10/15/2022 2039 10/12/22 0055 10/13/22 0633 10/14/22 0359  NA 131* 134* 133* 136 141  K 5.2* 3.9 3.9 4.1 4.5  CL 95* 102 101 106 107  CO2 21* 16* 16* 16* 21*  GLUCOSE 116* 106* 107* 133* 152*  BUN 24* 26* 26* 24* 41*  CREATININE 1.36* 1.18 1.04 1.00 1.15  CALCIUM 9.0 8.4* 8.3* 9.1 9.3  MG  --   --   --  1.9  --   PHOS  --   --   --  2.6  --     CBC: Recent Labs  Lab 10/10/2022 1140 10/12/22 0055 10/13/22 0633 10/14/22 0359  WBC 28.7* 15.7* 12.9* 15.0*  NEUTROABS 23.9*  --   --   --   HGB 15.2 13.1 14.3 14.7  HCT 44.4 37.8* 41.3 44.5  MCV 94.3 91.1 91.2 93.3  PLT 288 198 214 252    LFT Recent Labs  Lab 10/10/2022 1140 10/14/22 0359  AST 121* 204*  ALT 33 116*  ALKPHOS 41 55  BILITOT 1.4* 1.6*  PROT 6.3* 6.2*  ALBUMIN 3.8 3.4*     Antibiotics: Anti-infectives (From admission, onward)    Start     Dose/Rate Route Frequency Ordered Stop   10/12/22  1300  vancomycin (VANCOCIN) IVPB 1000 mg/200 mL premix  Status:  Discontinued        1,000 mg 200 mL/hr over 60 Minutes Intravenous Every 24 hours 10/17/2022 1332 09/19/2022 1442   10/12/22 1100  cefTRIAXone (ROCEPHIN) 2 g in sodium chloride 0.9 % 100 mL IVPB        2 g 200 mL/hr over 30 Minutes Intravenous Every 24 hours 10/12/22 1009 10/17/22 1059   10/12/22 0100  ceFEPIme (MAXIPIME) 2 g in sodium chloride 0.9 % 100 mL IVPB  Status:  Discontinued        2 g 200 mL/hr over 30 Minutes Intravenous Every 12 hours 10/18/2022 1332 09/19/2022 1442   10/01/2022 1300  ceFEPIme (MAXIPIME) 2 g in sodium chloride 0.9 % 100 mL IVPB        2 g 200 mL/hr over 30 Minutes Intravenous  Once 10/08/2022 1249 10/07/2022 1736   09/26/2022 1300  metroNIDAZOLE (FLAGYL) IVPB 500 mg        500 mg 100 mL/hr over 60 Minutes Intravenous  Once 10/10/2022 1249 10/06/2022 1557   10/01/2022 1300  vancomycin (VANCOCIN) IVPB 1000 mg/200 mL premix  Status:  Discontinued        1,000 mg 200 mL/hr over 60 Minutes Intravenous  Once 10/09/2022 1249 10/08/2022 1257   09/19/2022 1300  vancomycin (VANCOREADY) IVPB 1500 mg/300 mL        1,500 mg 150 mL/hr over 120 Minutes Intravenous  Once 09/21/2022 1257 10/17/2022 1716        DVT prophylaxis: Heparin  Code Status: DNR  Family Communication: Discussed with patient's daughter and son at bedside   CONSULTS    Objective    Physical Examination:   General-appears in mild respiratory distress Heart-S1-S2, regular, no murmur auscultated Lungs-bilateral rhonchi auscultated Abdomen-soft, nontender, no organomegaly Extremities-no edema in the lower extremities Neuro-alert, following commands   Status is: Inpatient:             [name]   [facility] If 7PM-7AM, please contact night-coverage at [email or web address], Office  [phone]   10/14/2022, 3:08 PM  LOS: 3 days

## 2022-10-14 NOTE — Plan of Care (Signed)
  Problem: Education: Goal: Ability to describe self-care measures that may prevent or decrease complications (Diabetes Survival Skills Education) will improve Outcome: Progressing Goal: Individualized Educational Video(s) Outcome: Progressing   Problem: Coping: Goal: Ability to adjust to condition or change in health will improve Outcome: Progressing   Problem: Fluid Volume: Goal: Ability to maintain a balanced intake and output will improve Outcome: Progressing   Problem: Health Behavior/Discharge Planning: Goal: Ability to identify and utilize available resources and services will improve Outcome: Progressing Goal: Ability to manage health-related needs will improve Outcome: Progressing   Problem: Metabolic: Goal: Ability to maintain appropriate glucose levels will improve Outcome: Progressing   Problem: Nutritional: Goal: Progress toward achieving an optimal weight will improve Outcome: Progressing   Problem: Skin Integrity: Goal: Risk for impaired skin integrity will decrease Outcome: Progressing   Problem: Tissue Perfusion: Goal: Adequacy of tissue perfusion will improve Outcome: Progressing   Problem: Education: Goal: Knowledge of General Education information will improve Description: Including pain rating scale, medication(s)/side effects and non-pharmacologic comfort measures Outcome: Progressing   Problem: Health Behavior/Discharge Planning: Goal: Ability to manage health-related needs will improve Outcome: Progressing   Problem: Clinical Measurements: Goal: Ability to maintain clinical measurements within normal limits will improve Outcome: Progressing Goal: Will remain free from infection Outcome: Progressing Goal: Diagnostic test results will improve Outcome: Progressing Goal: Respiratory complications will improve Outcome: Progressing Goal: Cardiovascular complication will be avoided Outcome: Progressing   Problem: Activity: Goal: Risk for  activity intolerance will decrease Outcome: Progressing   Problem: Coping: Goal: Level of anxiety will decrease Outcome: Progressing   Problem: Elimination: Goal: Will not experience complications related to bowel motility Outcome: Progressing Goal: Will not experience complications related to urinary retention Outcome: Progressing   Problem: Pain Managment: Goal: General experience of comfort will improve Outcome: Progressing   Problem: Safety: Goal: Ability to remain free from injury will improve Outcome: Progressing   Problem: Skin Integrity: Goal: Risk for impaired skin integrity will decrease Outcome: Progressing

## 2022-10-14 NOTE — Progress Notes (Signed)
Daily Progress Note   Patient Name: David [name]       Date: 10/14/2022 DOB: [birth year]  Age: 87 y.o. MRN#: [id number] Attending Physician: [name], MD Primary Care Physician: [name], MD Admit Date: 10/01/2022 Length of Stay: 3 days  Reason for Consultation/Follow-up: Establishing goals of care  HPI/Patient Profile:  87 y.o. male  with past medical history of arthritis, BPH, CAD, HTN, HLD, DM type 2, PNA, Dementia who presented to the emergency department after he was found in the yard by family friend after falling off the front porch.  He was admitted on 09/25/2022 with acute hypoxic respiratory failure from aspiration pneumonitis, hypothermia from environmental exposure, shock from sepsis and hypovolemia, AKI from sepsis and hypovolemia, rhabdomyolysis, history of dementia, dysphagia, and others.    PMT was consulted for [name] conversations.  Subjective:   Subjective: Chart Reviewed. Updates received. Patient Assessed. Created space and opportunity for patient  and family to explore thoughts and feelings regarding current medical situation.  Today's Discussion: Today saw the patient at the bedside.  After some time the patient's family arrived including his wife and caregiver.  We had a very frank discussion we reviewed worsening agitation and, more concerning, worsening breathing with bilateral rhonchi.  There is question of possible aspiration.  He had a lot of secretions and had to be suctioned and has since been moved to progressive care.  I explained my conversations with the speech therapist that noted poor movement of solids and increased aspiration risk.  Yesterday they made recommendation for nectar thick liquids but went to speech therapist today noted him more confused, agitated, and increased oxygen needs as well as lung crackles she attempted a trial of nectar thick liquids and ice and noted significant coughing that is concern for overt aspiration.  She recommended  n.p.o. status until [name] conversations today.  We attempted to involve the patient in our conversations.  I explained my concern that, especially given noted decline at home, that the patient has had progressive dementia and is losing the ability to safely swallow.  We reviewed that he would not want a feeding tube.  I also explained that feeding tube would likely not help in this situation as they are known to not prevent aspiration.    I recommended serious consideration of hospice services and comfort care. I described hospice as a service for patients who have a life expectancy of 6 months or less. The goal of hospice is the preservation of dignity and quality at the end phases of life. Under hospice care, the focus changes from curative to symptom relief.   We discussed that hospice services can be provided at home with adequate support.  We involve the patient in this decision and he seemed to indicate that he wanted comfort and peace at the end of life.  However, he seemed to indicate that he wanted to stay here rather than go home.  I offered that if remaining inpatient was desired we can transition to comfort care here and, depending on how he does in the coming days, consider referral for residential hospice.  With comfort care the patient would no longer receive aggressive medical interventions such as continuous vital signs, lab work, radiology testing, or medications not focused on comfort. All care would focus on how the patient is looking and feeling. This would include management of any symptoms that may cause discomfort, pain, shortness of breath, cough, nausea, agitation, anxiety, and/or secretions etc. Symptoms would be managed  with medications and other non-pharmacological interventions such as spiritual support if requested, repositioning, music therapy, or therapeutic listening. Family verbalized understanding and appreciation.   The patient's wife seems to be amendable to comfort care  and/or hospice services.  However, she requested time to call and speak with her children before making final decisions.  I excuse myself from the room sedated had phone discussions with the family.  I ensured they have our contact number to notify us of when a decision is made.  In the meantime we will continue current scope of care.  I provided emotional and general support through therapeutic listening, empathy, sharing of stories, and other techniques. I answered all questions and addressed all concerns to the best of my ability.  Review of Systems  Respiratory:  Positive for shortness of breath (Breathing is "so-so").   Gastrointestinal:  Negative for abdominal pain, nausea and vomiting.    Objective:   Vital Signs:  BP (!) 137/92 (BP Location: Left Arm)   Pulse 92   Temp 98.8 F (37.1 C) (Oral)   Resp 20   Ht 5\' 10"  (1.778 m)   Wt 78 kg   SpO2 96%   BMI 24.67 kg/m   Physical Exam: Physical Exam Vitals and nursing note reviewed.  Constitutional:      General: He is not in acute distress.    Appearance: He is ill-appearing.  HENT:     Head: Normocephalic and atraumatic.  Cardiovascular:     Rate and Rhythm: Normal rate.  Pulmonary:     Effort: Tachypnea (intermittently) present.     Comments: Noted intermittent desaturations, wet cough, audible crackles with expiration Abdominal:     General: Abdomen is flat.     Palpations: Abdomen is soft.  Skin:    General: Skin is warm and dry.  Neurological:     Mental Status: He is alert. He is confused.  Psychiatric:        Mood and Affect: Mood normal.        Behavior: Behavior normal.     Palliative Assessment/Data: 10% (NPO at this time)    Existing Vynca/ACP Documentation: MOST form signed 10/13/2022  Assessment & Plan:   Impression: Present on Admission:  Aspiration pneumonitis ([name])  SUMMARY OF RECOMMENDATIONS   DNR Continue current scope of care Allow time for family discussion on final decision on how  to proceed Recommended comfort care and/or hospice services Remain n.p.o. per SLP until decision on comfort care or hospice versus continued medical treatment PMT will continue to follow  Symptom Management:  Per primary team PMT is available to assist as needed, especially if transitions to comfort  Code Status: DNR  Prognosis: Unable to determine  Discharge Planning: To Be Determined  Discussed with: Patient, family, medical team, nursing team  Thank you for allowing [place] to participate in the care of SHU FAZZINO PMT will continue to support holistically.  Time Total: 80 min  Visit consisted of counseling and education dealing with the complex and emotionally intense issues of symptom management and palliative care in the setting of serious and potentially life-threatening illness. Greater than 50%  of this time was spent counseling and coordinating care related to the above assessment and plan.  Walden Field, NP Palliative Medicine Team  Team Phone # [phone] (Nights/Weekends)  03/19/2021, 8:17 AM

## 2022-10-14 NOTE — Progress Notes (Signed)
Daughter, Evren Abraha, has been updated about patients status and upcoming transfer to a progressive unit. Will call her back once we have a bed open for the patient.

## 2022-10-14 NOTE — Progress Notes (Signed)
  Transition of Care [facility]) Screening Note   Patient Details  Name: ARLEY GAYDOS Date of Birth: 1935-10-25   Transition of Care [facility]) CM/SW Contact:    Tom-Johnson, Renea Ee, RN Phone Number: 10/14/2022, 3:38 PM    Transition of Care Department [facility]) has reviewed patient and no TOC needs or recommendations have been identified at this time. TOC will continue to monitor patient advancement through interdisciplinary progression rounds. If new patient transition needs arise, please place a TOC consult.

## 2022-10-14 NOTE — Progress Notes (Signed)
   10/14/22 [id number]  Assess: MEWS Score  BP (!) 114/91  MAP (mmHg) 99  Pulse Rate (!) 121  Resp (!) 40  SpO2 (!) 87 %  O2 Device Nasal Cannula  O2 Flow Rate (L/min) 4 L/min  Assess: MEWS Score  MEWS Temp 0  MEWS Systolic 0  MEWS Pulse 2  MEWS RR 3  MEWS LOC 0  MEWS Score 5  MEWS Score Color Red  Assess: if the MEWS score is Yellow or Red  Were vital signs taken at a resting state? Yes  Focused Assessment Change from prior assessment (see assessment flowsheet)  Does the patient meet 2 or more of the SIRS criteria? Yes  Does the patient have a confirmed or suspected source of infection? Yes  Provider and Rapid Response Notified? Yes  MEWS guidelines implemented  Yes, red  Treat  MEWS Interventions Considered administering scheduled or prn medications/treatments as ordered  Take Vital Signs  Increase Vital Sign Frequency  Red: Q1hr x2, continue Q4hrs until patient remains green for 12hrs  Escalate  MEWS: Escalate Red: Discuss with charge nurse and notify provider. Consider notifying RRT. If remains red for 2 hours consider need for higher level of care  Notify: Charge Nurse/RN  Name of Charge Nurse/RN Notified Kathlee Nations, RN  Provider Notification  Provider Name/Title Velia Meyer, MD  Date Provider Notified 10/14/22  Time Provider Notified [phone]  Method of Notification Page  Notification Reason Change in status  Provider response See new orders  Date of Provider Response 10/14/22  Time of Provider Response 0640  Assess: SIRS CRITERIA  SIRS Temperature  0  SIRS Pulse 1  SIRS Respirations  1  SIRS WBC 0  SIRS Score Sum  2

## 2022-10-14 NOTE — Progress Notes (Addendum)
Transfer Note:  Pt transferred from [facility] [place] to [facility] [place] today at 1353. Upon assessment, pt is alert and oriented x3, disoriented to time, and pt follows commands. Pt intermittently endorsed 7/10 intermittent sore back pain. Pt is on room air at baseline however, pt has required supplemental oxygen, 4LNC, which was weaned to [facility] at 1430 today. Lung sounds are rhonchus bilaterally, and suction set up at bedside. Pt also arrived to unit with bilateral soft wrist restraints, due to agitation overnight. This writer was able to discontinue restraints today at 1420, after explaining safety plan to pt, and pt no longer displaying signs of agitation, and pt not removing any lines or interfering with care.   Fall precautions in place, with fall mats on floor, bed in lowest position, and call bell within pt's reach. Pt in NAD at this time.  Report received from [facility], Therapist, sports.

## 2022-10-14 NOTE — Progress Notes (Signed)
At approximately 1622, pt flagged a yellow MEWS due to heart rate 130s, and respirations 30s. Charge RN Antony Haste, notified AMD Dr. Darrick Meigs, also noting pt wet sounding lungs. Dr. Darrick Meigs came to the pt's bedside shortly after, and ordered an EKG, which showed A-Fib with RVR. Dr. Darrick Meigs ordered a one time IV Lasix 40 mg, given at 1731 (pt's second dose for today), and ordered a Diltiazem infusion, at starting rate of 5 mg/hr, initiated at 1743. A second IV line is being attempted for antibiotic therapy. Pt denied chest pain/palpitations/dizziness. This Probation officer will follow MEWS closely, with next vital signs at 1822. Pt in NAD at this time.

## 2022-10-14 NOTE — Care Management Important Message (Signed)
Important Message  Patient Details  Name: MONTEGO SEEDORF MRN: [id number] Date of Birth: 08-01-1935   Medicare Important Message Given:  Yes     Genni Buske 10/14/2022, 2:20 PM

## 2022-10-14 NOTE — Progress Notes (Signed)
Pharmacy Antibiotic Note  HENOCH UNGERMAN is a 87 y.o. male on day # 4 antibiotics initially for sepsis coverage, continued for pneumonia. Pharmacy has been consulted for Unasyn dosing for aspiration pneumonia. Changing from Ceftriaxone due to tachypnea and tachycardia.   Plan: Unasyn 3 gm IV q8hrs. Monitor renal function for any need to adjust regimen. Follow up culture data, clinical progress and antibiotic plans.   Height: 5\' 10"  (177.8 cm) Weight: 78 kg (171 lb 15.3 oz) IBW/kg (Calculated) : 73  Temp (24hrs), Avg:98.1 F (36.7 C), Min:97.5 F (36.4 C), Max:98.8 F (37.1 C)  Recent Labs  Lab 09/27/2022 1140 10/04/2022 1415 10/04/2022 2039 10/12/22 0055 10/13/22 0633 10/14/22 0359  WBC 28.7*  --   --  15.7* 12.9* 15.0*  CREATININE 1.36*  --  1.18 1.04 1.00 1.15  LATICACIDVEN 3.8* 3.4*  --   --   --   --     Estimated Creatinine Clearance: 49.4 mL/min (by C-G formula based on SCr of 1.15 mg/dL).    Allergies  Allergen Reactions   Atorvastatin Other (See Comments)    MYALGIAS CRAMPING   Crestor [Rosuvastatin]     MYALGIAS CRAMPING   Statins     MYALGIAS CRAMPING   Tetanus Toxoids     UNSPECIFIED REACTION     Antimicrobials this admission: Cefepime x 1 on 3/23 Vancomycin x 1 on 3/23  Metronidazole x 1 on 3/23 Ceftriaxone 3/24 >> 3/26 Unasyn 3/26 >>  Dose adjustments this admission: n/a  Microbiology results: 3/23 blood: ng x 3 days to date 3/23 MRSA PCR: negative  Thank you for allowing pharmacy to be a part of this patient's care.  Arty Baumgartner, [place] 10/14/2022 5:44 PM

## 2022-10-14 NOTE — Progress Notes (Addendum)
Called by RN that patient became tachypneic and tachycardic.  Patient earlier this morning also became tachypneic and tachycardic and was transferred to progressive care unit.  Improved with IV Lasix and DuoNeb nebulizers.    EKG obtained shows atrial fibrillation with RVR.  Family is in discussion with palliative care and planning to go for comfort care.  Still no decision on comfort care.  On exam patient continues to have bilateral rhonchi.  Tachypneic   Chest x-ray obtained earlier this morning showed pulmonary edema   Assessment Atrial fibrillation with RVR Pulmonary edema ?  Recurrent aspiration pneumonia  Plan  Will give 1 dose of Lasix 40 mg IV x 1 Start low-dose Cardizem gtt. 5 mg/h for atrial fibrillation, not a candidate for anticoagulation at this time Start DuoNeb scheduled every 6 hours Will switch from ceftriaxone to Unasyn to cover for anaerobic coverage for aspiration pneumonia  Will await final recommendations from palliative care.

## 2022-10-14 NOTE — Significant Event (Signed)
Rapid Response Event Note   Reason for Call :  Respiratory distress  Initial Focused Assessment:  On arrival, pt laying in bed with labored breathing. Confused with bilateral wrist restraints in place d/t pt removing equipment.   Skin is warm and dry. Rhonchi noted bilaterally.  VS: HR 122, BP 145/80, RR 44, spO2 93% on NRB  Copious amounts of tan/yellow secretions suctioned with yankauer to back of throat. After suctioning respirations slowed to 22 with an increase to spO2 of 100%. Pt placed back on 6L [place] with spO2 sustaining 95-100%.   Interventions:  Placed on NRB-PTA CXR Deep suctioned with yankauer  Lasix Md to bedside  Plan of Care:  Pt to move to PCU for closer monitoring. Continue to monitor respiratory status and suction as needed. RN instructed to call with any changes or concerns.    Event Summary:   MD Notified: Dr Darrick Meigs at 0800 Call Time: [place] Time: 0740 End Time: 0808  Sherilyn Dacosta, RN

## 2022-10-15 ENCOUNTER — Inpatient Hospital Stay (HOSPITAL_COMMUNITY): Payer: Medicare Other

## 2022-10-15 DIAGNOSIS — J690 Pneumonitis due to inhalation of food and vomit: Secondary | ICD-10-CM | POA: Diagnosis not present

## 2022-10-15 DIAGNOSIS — R579 Shock, unspecified: Secondary | ICD-10-CM | POA: Diagnosis not present

## 2022-10-15 DIAGNOSIS — Z7189 Other specified counseling: Secondary | ICD-10-CM | POA: Diagnosis not present

## 2022-10-15 DIAGNOSIS — Z515 Encounter for palliative care: Secondary | ICD-10-CM | POA: Diagnosis not present

## 2022-10-15 DIAGNOSIS — Z66 Do not resuscitate: Secondary | ICD-10-CM | POA: Diagnosis not present

## 2022-10-15 LAB — CBC
HCT: 45.2 % (ref 39.0–52.0)
Hemoglobin: 15 g/dL (ref 13.0–17.0)
MCH: 31.3 pg (ref 26.0–34.0)
MCHC: 33.2 g/dL (ref 30.0–36.0)
MCV: 94.4 fL (ref 80.0–100.0)
Platelets: 234 10*3/uL (ref 150–400)
RBC: 4.79 MIL/uL (ref 4.22–5.81)
RDW: 13.7 % (ref 11.5–15.5)
WBC: 15.3 10*3/uL — ABNORMAL HIGH (ref 4.0–10.5)
nRBC: 0 % (ref 0.0–0.2)

## 2022-10-15 LAB — MAGNESIUM: Magnesium: 2.3 mg/dL (ref 1.7–2.4)

## 2022-10-15 LAB — C-REACTIVE PROTEIN: CRP: 14.8 mg/dL — ABNORMAL HIGH (ref <1.0)

## 2022-10-15 LAB — BRAIN NATRIURETIC PEPTIDE: B Natriuretic Peptide: 1650.5 pg/mL — ABNORMAL HIGH (ref 0.0–100.0)

## 2022-10-15 LAB — GLUCOSE, CAPILLARY
Glucose-Capillary: 114 mg/dL — ABNORMAL HIGH (ref 70–99)
Glucose-Capillary: 133 mg/dL — ABNORMAL HIGH (ref 70–99)
Glucose-Capillary: 151 mg/dL — ABNORMAL HIGH (ref 70–99)

## 2022-10-15 LAB — BASIC METABOLIC PANEL
Anion gap: 18 — ABNORMAL HIGH (ref 5–15)
BUN: 46 mg/dL — ABNORMAL HIGH (ref 8–23)
CO2: 19 mmol/L — ABNORMAL LOW (ref 22–32)
Calcium: 9.2 mg/dL (ref 8.9–10.3)
Chloride: 108 mmol/L (ref 98–111)
Creatinine, Ser: 1.22 mg/dL (ref 0.61–1.24)
GFR, Estimated: 58 mL/min — ABNORMAL LOW (ref >60)
Glucose, Bld: 149 mg/dL — ABNORMAL HIGH (ref 70–99)
Potassium: 3.4 mmol/L — ABNORMAL LOW (ref 3.5–5.1)
Sodium: 145 mmol/L (ref 135–145)

## 2022-10-15 LAB — TSH: TSH: 1.988 u[IU]/mL (ref 0.350–4.500)

## 2022-10-15 LAB — PROCALCITONIN: Procalcitonin: 0.6 ng/mL

## 2022-10-15 MED ORDER — FUROSEMIDE 10 MG/ML IJ SOLN
80.0000 mg | Freq: Once | INTRAMUSCULAR | Status: AC
  Administered 2022-10-15: 80 mg via INTRAVENOUS
  Filled 2022-10-15: qty 8

## 2022-10-15 MED ORDER — POTASSIUM CHLORIDE 10 MEQ/100ML IV SOLN
10.0000 meq | INTRAVENOUS | Status: DC
  Administered 2022-10-15 (×2): 10 meq via INTRAVENOUS
  Filled 2022-10-15 (×2): qty 100

## 2022-10-15 MED ORDER — TAMSULOSIN HCL 0.4 MG PO CAPS
0.4000 mg | ORAL_CAPSULE | Freq: Every day | ORAL | Status: DC
  Filled 2022-10-15: qty 1

## 2022-10-15 MED ORDER — BIOTENE DRY MOUTH MT LIQD: 15.0000 mL | OROMUCOSAL | Status: DC | PRN

## 2022-10-15 MED ORDER — ACETAMINOPHEN 650 MG RE SUPP: 650.0000 mg | Freq: Four times a day (QID) | RECTAL | Status: DC | PRN

## 2022-10-15 MED ORDER — ONDANSETRON HCL 4 MG/2ML IJ SOLN: 4.0000 mg | Freq: Four times a day (QID) | INTRAMUSCULAR | Status: DC | PRN

## 2022-10-15 MED ORDER — POLYVINYL ALCOHOL 1.4 % OP SOLN: 1.0000 [drp] | Freq: Four times a day (QID) | OPHTHALMIC | Status: DC | PRN

## 2022-10-15 MED ORDER — GLYCOPYRROLATE 0.2 MG/ML IJ SOLN
0.2000 mg | INTRAMUSCULAR | Status: DC | PRN
  Administered 2022-10-16: 0.2 mg via INTRAVENOUS
  Filled 2022-10-15: qty 1

## 2022-10-15 MED ORDER — GLYCOPYRROLATE 1 MG PO TABS: 1.0000 mg | ORAL_TABLET | ORAL | Status: DC | PRN

## 2022-10-15 MED ORDER — ACETAMINOPHEN 325 MG PO TABS: 650.0000 mg | ORAL_TABLET | Freq: Four times a day (QID) | ORAL | Status: DC | PRN

## 2022-10-15 MED ORDER — ONDANSETRON 4 MG PO TBDP: 4.0000 mg | ORAL_TABLET | Freq: Four times a day (QID) | ORAL | Status: DC | PRN

## 2022-10-15 MED ORDER — DILTIAZEM HCL 25 MG/5ML IV SOLN: 10.0000 mg | Freq: Four times a day (QID) | INTRAVENOUS | Status: DC | PRN

## 2022-10-15 MED ORDER — HALOPERIDOL 1 MG PO TABS: 0.5000 mg | ORAL_TABLET | ORAL | Status: DC | PRN

## 2022-10-15 MED ORDER — GLYCOPYRROLATE 0.2 MG/ML IJ SOLN: 0.2000 mg | INTRAMUSCULAR | Status: DC | PRN

## 2022-10-15 MED ORDER — HALOPERIDOL LACTATE 5 MG/ML IJ SOLN
0.5000 mg | INTRAMUSCULAR | Status: DC | PRN
  Administered 2022-10-15: 0.5 mg via INTRAVENOUS
  Filled 2022-10-15: qty 1

## 2022-10-15 MED ORDER — HALOPERIDOL LACTATE 2 MG/ML PO CONC: 0.5000 mg | ORAL | Status: DC | PRN

## 2022-10-15 NOTE — Plan of Care (Signed)
  Problem: Fluid Volume: Goal: Ability to maintain a balanced intake and output will improve Outcome: Progressing   Problem: Health Behavior/Discharge Planning: Goal: Ability to manage health-related needs will improve Outcome: Progressing   Problem: Nutritional: Goal: Progress toward achieving an optimal weight will improve Outcome: Progressing   Problem: Skin Integrity: Goal: Risk for impaired skin integrity will decrease Outcome: Progressing   Problem: Tissue Perfusion: Goal: Adequacy of tissue perfusion will improve Outcome: Progressing   Problem: Clinical Measurements: Goal: Respiratory complications will improve Outcome: Progressing

## 2022-10-15 NOTE — Progress Notes (Signed)
Daily Progress Note   Patient Name: David [name]       Date: 10/15/2022 DOB: Jun 18, 1936  Age: 87 y.o. MRN#: [id number] Attending Physician: Thurnell Lose, MD Primary Care Physician: [name], MD Admit Date: 09/30/2022 Length of Stay: 4 days  Reason for Consultation/Follow-up: Establishing goals of care  HPI/Patient Profile:  87 y.o. male  with past medical history of arthritis, BPH, CAD, HTN, HLD, DM type 2, PNA, Dementia who presented to the emergency department after he was found in the yard by family friend after falling off the front porch.  He was admitted on 09/24/2022 with acute hypoxic respiratory failure from aspiration pneumonitis, hypothermia from environmental exposure, shock from sepsis and hypovolemia, AKI from sepsis and hypovolemia, rhabdomyolysis, history of dementia, dysphagia, and others.    PMT was consulted for [place] conversations.  Subjective:   Subjective: Chart Reviewed. Updates received. Patient Assessed. Created space and opportunity for patient  and family to explore thoughts and feelings regarding current medical situation.  Today's Discussion: Today saw the patient at bedside, no family was present.  He admits to and was quite agitated and confused.  Noted to be tachypneic with oxygen saturations in the mid upper 80s.  I called and left a voicemail for his wife to call back to discuss results of family discussion on comfort care.  I received a message from the staff that the patient's wife is present at the bedside later in the afternoon.  I went back to the bedside and we had a discussion with the patient's wife, patient's caregiver, and the patient's son (who participated via telephone).  We confirmed desire for transition to comfort care.  I explained that with comfort care the patient would no longer receive aggressive medical interventions such as continuous vital signs, lab work, radiology testing, or medications not focused on comfort. All care  would focus on how the patient is looking and feeling. This would include management of any symptoms that may cause discomfort, pain, shortness of breath, cough, nausea, agitation, anxiety, and/or secretions etc. Symptoms would be managed with medications and other non-pharmacological interventions such as spiritual support if requested, repositioning, music therapy, or therapeutic listening. Family verbalized understanding and appreciation.  Family notes that, per patient's previously expressed wishes, they would like to remain inpatient on comfort care.  I discussed with the patient's care team transition to comfort care.  Comfort care orders entered.  Review of Systems  Respiratory:  Negative for shortness of breath.   Gastrointestinal:  Negative for abdominal pain, nausea and vomiting.    Objective:   Vital Signs:  BP 107/65 (BP Location: Right Arm)   Pulse (!) 56   Temp 98.3 F (36.8 C) (Axillary)   Resp (!) 24   Ht 5\' 10"  (1.778 m)   Wt 78 kg   SpO2 96%   BMI 24.67 kg/m   Physical Exam: Physical Exam Vitals and nursing note reviewed.  Constitutional:      General: He is in acute distress (Moderate respiratory distress, noted agitation).     Appearance: He is ill-appearing.  Cardiovascular:     Rate and Rhythm: Normal rate.  Pulmonary:     Effort: Tachypnea and respiratory distress (Moderate tachypnea and irregular breathing) present.     Comments: Irregular rate and depth of breathing, wet quality to voice and breathing Skin:    General: Skin is warm and dry.  Neurological:     Mental Status: He is alert. He is disoriented and confused.  Psychiatric:        Behavior: Behavior is agitated.     Palliative Assessment/Data: 10-20%    Existing Vynca/ACP Documentation: MOST form completed 10/13/2022  Assessment & Plan:   Impression: Present on Admission:  Aspiration pneumonitis ([place])  SUMMARY OF RECOMMENDATIONS   DNR Transition to comfort care Emotional  support of patient and family PMT will continue to follow daily for symptom management   Symptom Management:  Robinul 0.2 mg IV every 4 hours as needed excessive secretions Haldol 0.5 mg IV every 4 hours as needed agitation or delirium Dilaudid 1 mg IV every 4 hours as needed moderate pain or dyspnea Zofran 4 mg IV every 6 hours as needed nausea Polyvinyl alcohol 1.4% ophthalmic 1 drop OU 4 times daily as needed dry eyes  Code Status: DNR  Prognosis: Hours - Days  Discharge Planning: Anticipated Hospital Death  Discussed with: Patient, patient's family, medical team, nursing team  Thank you for allowing [place] to participate in the care of David [name] PMT will continue to support holistically.  Billing based on MDM: High  Problems Addressed: One acute or chronic illness or injury that poses a threat to life or bodily function  Amount and/or Complexity of Data: Category 3:Discussion of management or test interpretation with external physician/other qualified health care professional/appropriate source (not separately reported)  Risks: Parenteral controlled substances   Walden Field, NP Palliative Medicine Team  Team Phone # [phone] (Nights/Weekends)  03/19/2021, 8:17 AM

## 2022-10-15 NOTE — Progress Notes (Addendum)
PROGRESS NOTE                                                                                                                                                                                                             Patient Demographics:    David [name], is a 87 y.o. male, DOB - Nov 06, 1935, [id number]  Outpatient Primary MD for the patient is [name], MD    LOS - 4  Admit date - 09/27/2022    Chief Complaint  Patient presents with   Fall       Brief Narrative (HPI from H&P)   87 year old male with history of dementia, diabetes mellitus type 2, hypertension, CAD, BPH presented to the ED after he was found by family friend at 56 AM on 3/23 outside his home. Patient apparently fell and was outside his house all night. Patient was found to be hypothermic with temperature 91 degrees Fahrenheit. CPK 5984. Chest x-ray showed bilateral infiltrates left more than right, he was admitted to ICU for acute hypoxic respiratory failure secondary to aspiration pneumonia, sepsis, dysphagia, rhabdomyolysis, AKI all in the setting of dementia.  His stay was complicated by CHF.   Subjective:    David [name] today in bed appears to be in moderate to severe respiratory distress, confused unable to answer questions or follow commands reliably.   Assessment  & Plan :   Advanced dementia, toxic and metabolic encephalopathy, deconditioning, dysphagia.  Unfortunately patient has very poor baseline and has developed aspiration pneumonia due to underlying dysphagia, currently on 5 L of nasal cannula oxygen with respiratory distress.  Long-term prognosis is poor, he is DNR, had detailed discussion with patient's daughter over the phone on 10/15/2022 she desires gentle medical treatment if any further decline full comfort care.  Continue supportive care.  CT head and C-spine nonacute, remains at risk for encephalopathy and delirium, minimize  narcotics and benzodiazepines.  Restraints as needed.  Acute hypoxic respiratory failure in the setting of aspiration pneumonia, dysphagia and now acute on chronic combined systolic and diastolic CHF EF on recent echo 30%. He has ongoing dysphagia and aspiration, on Unasyn, also developing CHF for which she has been placed on high-dose IV Lasix, overall prognosis is poor due to ongoing aspiration.  Continue beta-blocker, blood pressure too low to add ACE inhibitor or ARB.  Again prognosis  is guarded.  Newly diagnosed A-fib RVR on 10/14/2022.  Was on Cardizem drip, currently in rate controlled, [place] vas 2 score is greater than 3 but patient is a poor candidate for anticoagulation and close to being comfort measures, continue Lopressor, as needed IV Cardizem, titrate off Cardizem drip and monitor.  Recent echo noted, TSH will be checked.    Hypertension.  Currently on beta-blocker.  Rhabdomyolysis with AKI.  Improved with hydration.  Sepsis, shock, hypovolemia.  Due to combination of aspiration pneumonia, exposure to elements by laying outside the hospital overnight, this problem has resolved.  Urinary retention.  Foley, Flomax on 10/15/2022.    Hypokalemia.  Replaced.    DM type II.  ISS.  CBG (last 3)  Recent Labs    10/14/22 2304 10/15/22 0340 10/15/22 0842  GLUCAP 115* 114* 133*         Condition - Extremely Guarded  Family Communication  : Daughter Ria Comment [phone]  on 10/15/2022  Code Status :  DNR  Consults  :  Pall Care  PUD Prophylaxis :    Procedures  :           Disposition Plan  :    Status is: Inpatient   DVT Prophylaxis  :    heparin injection 5,000 Units Start: 10/06/2022 1600  Lab Results  Component Value Date   PLT 234 10/15/2022    Diet :  Diet Order             Diet clear liquid Room service appropriate? Yes; Fluid consistency: Nectar Thick  Diet effective now                    Inpatient Medications  Scheduled Meds:   arformoterol  15 mcg Nebulization BID   budesonide (PULMICORT) nebulizer solution  0.5 mg Nebulization BID   Chlorhexidine Gluconate Cloth  6 each Topical Daily   heparin injection (subcutaneous)  5,000 Units Subcutaneous Q8H   insulin aspart  0-15 Units Subcutaneous Q4H   ipratropium-albuterol  3 mL Nebulization TID   metoprolol tartrate  5 mg Intravenous Q6H   nicotine  21 mg Transdermal Daily   mouth rinse  15 mL Mouth Rinse 4 times per day   tamsulosin  0.4 mg Oral Daily   Continuous Infusions:  sodium chloride     ampicillin-sulbactam (UNASYN) IV 3 g (10/15/22 0218)   diltiazem (CARDIZEM) infusion 5 mg/hr (10/15/22 0931)   PRN Meds:.HYDROmorphone (DILAUDID) injection, mouth rinse    Objective:   Vitals:   10/15/22 0200 10/15/22 0300 10/15/22 0700 10/15/22 0746  BP: 115/69 (!) 116/94 107/65   Pulse: 68 94 (!) 56   Resp: (!) 23 (!) 21 (!) 24   Temp: 97.9 F (36.6 C) 97.6 F (36.4 C) 98.3 F (36.8 C)   TempSrc: Oral Oral Axillary   SpO2: 92% 93% 100% 96%  Weight:      Height:        Wt Readings from Last 3 Encounters:  10/12/22 78 kg  09/10/22 75.8 kg  07/16/22 77.2 kg     Intake/Output Summary (Last 24 hours) at 10/15/2022 0940 Last data filed at 10/15/2022 0500 Gross per 24 hour  Intake 375.12 ml  Output 1525 ml  Net -1149.88 ml     Physical Exam  Awake confused, moving all 4 extremities, appears to be in respiratory distress, [place].AT,PERRAL Supple Neck, No JVD,   Symmetrical Chest wall movement, Good air movement bilaterally, coarse bilateral breath sounds and crackles RRR,No  Gallops,Rubs or new Murmurs,  +ve B.Sounds, Abd Soft, No tenderness,   No Cyanosis, Clubbing or edema        Data Review:    Recent Labs  Lab 09/27/2022 1140 10/12/22 0055 10/13/22 0633 10/14/22 0359 10/15/22 0558  WBC 28.7* 15.7* 12.9* 15.0* 15.3*  HGB 15.2 13.1 14.3 14.7 15.0  HCT 44.4 37.8* 41.3 44.5 45.2  PLT 288 198 214 252 234  MCV 94.3 91.1 91.2 93.3 94.4  MCH  32.3 31.6 31.6 30.8 31.3  MCHC 34.2 34.7 34.6 33.0 33.2  RDW 13.0 13.1 13.4 13.5 13.7  LYMPHSABS 1.1  --   --   --   --   MONOABS 3.3*  --   --   --   --   EOSABS 0.0  --   --   --   --   BASOSABS 0.1  --   --   --   --     Recent Labs  Lab 10/15/2022 1140 09/27/2022 1415 09/25/2022 2039 10/12/22 0055 10/13/22 0633 10/14/22 0359 10/15/22 0558  NA 131*  --  134* 133* 136 141 145  K 5.2*  --  3.9 3.9 4.1 4.5 3.4*  CL 95*  --  102 101 106 107 108  CO2 21*  --  16* 16* 16* 21* 19*  ANIONGAP 15  --  16* 16* 14 13 18*  GLUCOSE 116*  --  106* 107* 133* 152* 149*  BUN 24*  --  26* 26* 24* 41* 46*  CREATININE 1.36*  --  1.18 1.04 1.00 1.15 1.22  AST 121*  --   --   --   --  204*  --   ALT 33  --   --   --   --  116*  --   ALKPHOS 41  --   --   --   --  55  --   BILITOT 1.4*  --   --   --   --  1.6*  --   ALBUMIN 3.8  --   --   --   --  3.4*  --   CRP  --   --   --   --   --   --  14.8*  PROCALCITON  --   --   --   --   --  0.62 0.60  LATICACIDVEN 3.8* 3.4*  --   --   --   --   --   BNP  --   --   --   --   --  2,497.1* 1,650.5*  MG  --   --   --   --  1.9  --  2.3  CALCIUM 9.0  --  8.4* 8.3* 9.1 9.3 9.2      Recent Labs  Lab 09/25/2022 1140 09/20/2022 1415 10/13/2022 2039 10/12/22 0055 10/13/22 0633 10/14/22 0359 10/15/22 0558  CRP  --   --   --   --   --   --  14.8*  PROCALCITON  --   --   --   --   --  0.62 0.60  LATICACIDVEN 3.8* 3.4*  --   --   --   --   --   BNP  --   --   --   --   --  2,497.1* 1,650.5*  MG  --   --   --   --  1.9  --  2.3  CALCIUM 9.0  --  8.4*  8.3* 9.1 9.3 9.2   Lab Results  Component Value Date   HGBA1C 5.5 09/10/2022    Radiology Reports DG Chest Port 1 View  Result Date: 10/15/2022 CLINICAL DATA:  Aspiration pneumonia EXAM: PORTABLE CHEST 1 VIEW COMPARISON:  Yesterday FINDINGS: Severe bilateral airspace disease. Stable heart size which is within normal limits for technique. No gross effusion. No visible pneumothorax. Artifact from EKG leads.  IMPRESSION: Unchanged severe airspace disease. Electronically Signed   By: Jorje Guild M.D.   On: 10/15/2022 06:54   DG Chest Port 1 View  Result Date: 10/14/2022 CLINICAL DATA:  Hypoxia.  Aspiration pneumonia. EXAM: PORTABLE CHEST 1 VIEW COMPARISON:  09/27/2022 FINDINGS: Artifact overlies the chest. Cardiomegaly and aortic atherosclerosis as seen previously. Worsening of diffuse pulmonary opacity most consistent with pulmonary edema. Widespread pneumonia is possible. Aspiration is usually more focal or patchy, but is not excluded. There is more focal volume loss in the left lower lobe. IMPRESSION: Worsening of diffuse pulmonary opacity most consistent with pulmonary edema. Widespread pneumonia is possible. Aspiration is usually more focal or patchy, but is not excluded, particularly in the left lower lobe where density is more pronounced. Electronically Signed   By: Nelson Chimes M.D.   On: 10/14/2022 08:43   DG Swallowing Func-Speech Pathology  Result Date: 10/13/2022 Table formatting from the original result was not included. Modified Barium Swallow Study Patient Details Name: RANDOM TOUSANT MRN: [id number] Date of Birth: 1935/10/08 Today's Date: 10/13/2022 HPI/PMH: HPI: 87 yo male was found by family friend at 61 am on 3/23 outside his home after falling.  Last know contact was 5 pm on 3/22.  Noted to have temp 48F, lactic acid 3.8, CPK 5984.  CXR showed bilateral infiltrates Lt > Rt.  Started on ABx, pressors, and IV fluids.  PCCM asked to admit to ICU; CT head on 10/05/2022 indicated Chronic microvascular ischemic change and cerebral volume loss; CXR on 10/12/22 indicated Mild asymmetric interstitial opacities, left worse than right; ST consulted for BSE with pt exhibiting overt s/sx of aspiration/regurgitation with most consistencies during assessment on 10/12/22.  MBS generated to assess overall swallow function. Clinical Impression: Clinical Impression: Pt exhibited an Mild-moderate oropharyngeal  dysphagia c/b slight oral holding with trace residue lining the oral structures and a swallow initiation varying from triggering at the valleculae-pyriform sinuses. Pharyngeal phase of the swallow was moderately delayed with decreased tongue base retraction,reduced pharyngeal stripping wave, partial epiglottic inversion/movement, partial anterior hyoid movement and incomplete laryngeal vestibule closure noted with most consistencies d/t residue remaining in valleculae/pyriform sinuses.  Compensatory strategies utilized included: chin tuck, repetitive dry swallows, liquid wash and effortful swallow which did not remediate imoderate vallecular/pyriform sinus residue.  Pt aspirated thin via cup sip (PAS 4) with cough clearing laryngeal vestibule.  Nectar thick liquids did not result in aspiration when utilized in isolation (PAS 2), but when used as a liquid wash, the nectar thick liquids yielded (PAS 4).  Pt uses "hocking" material up during meals and expels from oral cavity and/or coughs to clear the airway. Esophageal component cannot be ruled out as this was difficult to assess d/t pt positioning.  Due to moderate risk for aspiration, recommend initiating a conservative diet of nectar-thickened liquids ONLY using compensatory swallowing strategies and FULL supervision with meals.  Dysphagia tx recommended to remediate swallow function addressing above deficits in acute setting. Factors that may increase risk of adverse event in presence of aspiration ([place] 2021): Factors that may increase risk of adverse event  in presence of aspiration ([place] 2021): Poor general health and/or compromised immunity; Respiratory or GI disease; Reduced cognitive function; Frail or deconditioned; Dependence for feeding and/or oral hygiene; Frequent aspiration of large volumes Recommendations/Plan: Swallowing Evaluation Recommendations Swallowing Evaluation Recommendations Recommendations: PO diet (nectar-thickened  liquids) PO Diet Recommendation: Mildly thick liquids (Level 2, nectar thick) Liquid Administration via: Cup; Spoon; No straw Medication Administration: Other (Comment) (whole with nectar as tolerated) Supervision: Other (comment) Swallowing strategies  : Minimize environmental distractions; Slow rate; Small bites/sips; effortful swallow; Multiple dry swallows after each bite/sip Oral care recommendations: Oral care BID (2x/day); Use suctioning for oral care Recommended consults: Other(comment) (potential for esophageal consult) Treatment Plan Treatment Plan Treatment recommendations: Therapy as outlined in treatment plan below Follow-up recommendations: Other (comment) (TBD) Functional status assessment: Patient has had a recent decline in their functional status and demonstrates the ability to make significant improvements in function in a reasonable and predictable amount of time. Treatment frequency: Min 2x/week Treatment duration: 1 week Interventions: Aspiration precaution training; Compensatory techniques; Patient/family education; Diet toleration management by SLP; Trials of upgraded texture/liquids Recommendations Recommendations for follow up therapy are one component of a multi-disciplinary discharge planning process, led by the attending physician.  Recommendations may be updated based on patient status, additional functional criteria and insurance authorization. Assessment: Orofacial Exam: Orofacial Exam Oral Cavity: Oral Hygiene: Xerostomia Oral Cavity - Dentition: Adequate natural dentition; Missing dentition Orofacial Anatomy: WFL Oral Motor/Sensory Function: WFL Anatomy: Anatomy: WFL Boluses Administered: No data recorded Oral Impairment Domain: Oral Impairment Domain Lip Closure: No labial escape Tongue control during bolus hold: Cohesive bolus between tongue to palatal seal Bolus preparation/mastication: Slow prolonged chewing/mashing with complete recollection Bolus transport/lingual motion:  Brisk tongue motion Oral residue: Trace residue lining oral structures Location of oral residue : Tongue (min) Initiation of pharyngeal swallow : Pyriform sinuses  Pharyngeal Impairment Domain: Pharyngeal Impairment Domain Soft palate elevation: No bolus between soft palate (SP)/pharyngeal wall (PW) Laryngeal elevation: Partial superior movement of thyroid cartilage/partial approximation of arytenoids to epiglottic petiole Anterior hyoid excursion: Partial anterior movement Epiglottic movement: Partial inversion Laryngeal vestibule closure: Incomplete, narrow column air/contrast in laryngeal vestibule Pharyngeal stripping wave : Present - diminished Pharyngeal contraction (A/P view only): N/A Pharyngoesophageal segment opening: Partial distention/partial duration, partial obstruction of flow Tongue base retraction: Trace column of contrast or air between tongue base and PPW Pharyngeal residue: Majority of contrast within or on pharyngeal structures Location of pharyngeal residue: Valleculae; Pyriform sinuses  Esophageal Impairment Domain: No data recorded Pill: No data recorded Penetration/Aspiration Scale Score: No data recorded Compensatory Strategies: Compensatory Strategies Compensatory strategies: Yes Effortful swallow: Ineffective Ineffective Effortful Swallow: Thin liquid (Level 0); Mildly thick liquid (Level 2, nectar thick); Moderately thick liquid (Level 3, honey thick); Puree; Solid Multiple swallows: Effective; Ineffective Effective Multiple Swallows: Thin liquid (Level 0); Mildly thick liquid (Level 2, nectar thick) Ineffective Multiple Swallows: Moderately thick liquid (Level 3, honey thick); Puree; Solid Chin tuck: Ineffective Ineffective Chin Tuck: Puree Liquid wash: Ineffective Ineffective Liquid Wash: Solid   General Information: Caregiver present: No  Diet Prior to this Study: NPO   Temperature : Normal   Respiratory Status: WFL   Supplemental O2: None (Room air)   History of Recent Intubation: No   Behavior/Cognition: Alert; Cooperative; Distractible; Requires cueing Self-Feeding Abilities: Able to self-feed Baseline vocal quality/speech: Dysphonic Volitional Cough: Able to elicit Volitional Swallow: Able to elicit Exam Limitations: Limited visibility Goal Planning: Prognosis for improved oropharyngeal function: Good Barriers to Reach Goals: Cognitive deficits  No data recorded Patient/Family Stated Goal: None reported Consulted and agree with results and recommendations: Nurse; Pt unable/family or caregiver not available Pain: Pain Assessment Pain Assessment: No/denies pain Breathing: 0 Negative Vocalization: 0 Facial Expression: 0 Body Language: 0 Consolability: 0 PAINAD Score: 0 Facial Expression: 0 Body Movements: 0 Muscle Tension: 0 Compliance with ventilator (intubated pts.): N/A Vocalization (extubated pts.): 0 CPOT Total: 0 End of Session: Start Time:SLP Start Time (ACUTE ONLY): 0940 Stop Time: SLP Stop Time (ACUTE ONLY): 0957 Time Calculation:SLP Time Calculation (min) (ACUTE ONLY): 17 min Charges: SLP Evaluations $ SLP Speech Visit: 1 Visit SLP Evaluations $BSS Swallow: 1 Procedure $MBS Swallow: 1 Procedure SLP visit diagnosis: SLP Visit Diagnosis: Dysphagia, oropharyngeal phase (R13.12) Past Medical History: Past Medical History: Diagnosis Date  Arthritis   BPH (benign prostatic hyperplasia)   CAD S/P percutaneous coronary angioplasty 12/2009; 08/2010  a) 6/'22 - NSTEMI: PCI to LAD: Promus Element 2.5 mm x 15 mm DES; b) Cath for Angina: Prox LAD 60-70% pre-stent with FFR 0.82, 90% RVM -- Med Rx, EF 50-55%  Essential hypertension   Hyperlipidemia with target LDL less than 70   Statin intolerance  Non-ST elevation MI (NSTEMI) [facility]) June 2011  PCI to LAD  Pneumonia   DEC 2016  TX WITH ANTIBIOTIC  Type 2 diabetes mellitus without complications [facility])  Past Surgical History: Past Surgical History: Procedure Laterality Date  CARDIAC CATHETERIZATION  12/2009  Proximal LAD stenosis followed by a significant  80-90% distal stenosis  CARDIAC CATHETERIZATION  February 2012   90% ostial RV marginal branch; 60-70% proximal LAD with widely patent distal stent. FFR 0.82; medical therapy  CATARACT EXTRACTION W/ INTRAOCULAR LENS  IMPLANT, BILATERAL    CORONARY ANGIOPLASTY  12/2009  PTCA to proximal LAD; PCI with Promus Element DES stent  2.5 mm x 15 mm  distalLAD - .for non-ST elevation MI  DENTAL SURGERY    LUMBAR LAMINECTOMY/DECOMPRESSION MICRODISCECTOMY N/A 08/09/2015  Procedure: Lumbar Four-Five, Lumbar Five- Sacral One Decompressive Lumbar Laminectomy with Resection of Synovial Cyst;  Surgeon: Erline Levine, MD;  Location: [place] [facility];  Service: Neurosurgery;  Laterality: N/A;  L4 to S1 Decompressive Lumbar Laminectomy  LUMBAR LAMINECTOMY/DECOMPRESSION MICRODISCECTOMY Right 01/21/2016  Procedure: Redo Right Lumbar Five-Sacral One Laminectomy for synovial cyst;  Surgeon: Erline Levine, MD;  Location: [place] [facility];  Service: Neurosurgery;  Laterality: Right;  right  LUMBAR LAMINECTOMY/DECOMPRESSION MICRODISCECTOMY Right 10/08/2017  Procedure: Right Lumbar three-four Laminectomy for synovial cyst;  Surgeon: Erline Levine, MD;  Location: [facility];  Service: Neurosurgery;  Laterality: Right;  NASAL FRACTURE SURGERY    TRANSTHORACIC ECHOCARDIOGRAM  June 2011  - (EF not reported) Moderately dilated LV; moderate hypokinesis of anteroseptal and anterior wall consistent with MI. Grade 1 diastolic dysfunction. Mild to moderately dilated LA  VASECTOMY   Pat Adams,M.S., CCC-SLP 10/13/2022, 5:06 PM  Modified Barium Swallow Study Patient Details Name: ACER BALTZ MRN: [id number] Date of Birth: 1936-06-17 Today's Date: 10/13/2022 HPI/PMH: HPI: 87 yo male was found by family friend at 32 am on 3/23 outside his home after falling.  Last know contact was 5 pm on 3/22.  Noted to have temp 78F, lactic acid 3.8, CPK 5984.  CXR showed bilateral infiltrates Lt > Rt.  Started on ABx, pressors, and IV fluids.  PCCM asked to admit to ICU; CT head on 10/06/2022  indicated Chronic microvascular ischemic change and cerebral volume loss; CXR on 10/12/22 indicated Mild asymmetric interstitial opacities, left worse than right; ST consulted for BSE with pt exhibiting  overt s/sx of aspiration/regurgitation with most consistencies during assessment on 10/12/22.  MBS generated to assess overall swallow function. Clinical Impression: Clinical Impression: Pt exhibited an Mild-moderate oropharyngeal dysphagia c/b slight oral holding with trace residue lining the oral structures and a swallow initiation varying from triggering at the valleculae-pyriform sinuses. Pharyngeal phase of the swallow was moderately delayed with decreased tongue base retraction,reduced pharyngeal stripping wave, partial epiglottic inversion/movement, partial anterior hyoid movement and incomplete laryngeal vestibule closure noted with most consistencies d/t residue remaining in valleculae/pyriform sinuses.  Compensatory strategies utilized included: chin tuck, repetitive dry swallows, liquid wash and effortful swallow which did not remediate imoderate vallecular/pyriform sinus residue.  Pt aspirated thin via cup sip (PAS 4) with cough clearing laryngeal vestibule.  Nectar thick liquids did not result in aspiration when utilized in isolation (PAS 2), but when used as a liquid wash, the nectar thick liquids yielded (PAS 4).  Pt uses "hocking" material up during meals and expels from oral cavity and/or coughs to clear the airway. Esophageal component cannot be ruled out as this was difficult to assess d/t pt positioning.  Due to moderate risk for aspiration, recommend initiating a conservative diet of nectar-thickened liquids ONLY using compensatory swallowing strategies and FULL supervision with meals.  Dysphagia tx recommended to remediate swallow function addressing above deficits in acute setting. Factors that may increase risk of adverse event in presence of aspiration ([place] 2021): Factors that may  increase risk of adverse event in presence of aspiration ([place] 2021): Poor general health and/or compromised immunity; Respiratory or GI disease; Reduced cognitive function; Frail or deconditioned; Dependence for feeding and/or oral hygiene; Frequent aspiration of large volumes Recommendations/Plan: Swallowing Evaluation Recommendations Swallowing Evaluation Recommendations Recommendations: PO diet (nectar-thickened liquids) PO Diet Recommendation: Mildly thick liquids (Level 2, nectar thick) Liquid Administration via: Cup; Spoon; No straw Medication Administration: Other (Comment) (whole with nectar as tolerated) Supervision: Other (comment) Swallowing strategies  : Minimize environmental distractions; Slow rate; Small bites/sips; effortful swallow; Multiple dry swallows after each bite/sip Oral care recommendations: Oral care BID (2x/day); Use suctioning for oral care Recommended consults: Other(comment) (potential for esophageal consult) Treatment Plan Treatment Plan Treatment recommendations: Therapy as outlined in treatment plan below Follow-up recommendations: Other (comment) (TBD) Functional status assessment: Patient has had a recent decline in their functional status and demonstrates the ability to make significant improvements in function in a reasonable and predictable amount of time. Treatment frequency: Min 2x/week Treatment duration: 1 week Interventions: Aspiration precaution training; Compensatory techniques; Patient/family education; Diet toleration management by SLP; Trials of upgraded texture/liquids Recommendations Recommendations for follow up therapy are one component of a multi-disciplinary discharge planning process, led by the attending physician.  Recommendations may be updated based on patient status, additional functional criteria and insurance authorization. Assessment: Orofacial Exam: Orofacial Exam Oral Cavity: Oral Hygiene: Xerostomia Oral Cavity - Dentition: Adequate  natural dentition; Missing dentition Orofacial Anatomy: WFL Oral Motor/Sensory Function: WFL Anatomy: Anatomy: WFL Boluses Administered: No data recorded Oral Impairment Domain: Oral Impairment Domain Lip Closure: No labial escape Tongue control during bolus hold: Cohesive bolus between tongue to palatal seal Bolus preparation/mastication: Slow prolonged chewing/mashing with complete recollection Bolus transport/lingual motion: Brisk tongue motion Oral residue: Trace residue lining oral structures Location of oral residue : Tongue (min) Initiation of pharyngeal swallow : Pyriform sinuses  Pharyngeal Impairment Domain: Pharyngeal Impairment Domain Soft palate elevation: No bolus between soft palate (SP)/pharyngeal wall (PW) Laryngeal elevation: Partial superior movement of thyroid cartilage/partial approximation of arytenoids to epiglottic  petiole Anterior hyoid excursion: Partial anterior movement Epiglottic movement: Partial inversion Laryngeal vestibule closure: Incomplete, narrow column air/contrast in laryngeal vestibule Pharyngeal stripping wave : Present - diminished Pharyngeal contraction (A/P view only): N/A Pharyngoesophageal segment opening: Partial distention/partial duration, partial obstruction of flow Tongue base retraction: Trace column of contrast or air between tongue base and PPW Pharyngeal residue: Majority of contrast within or on pharyngeal structures Location of pharyngeal residue: Valleculae; Pyriform sinuses  Esophageal Impairment Domain: No data recorded Pill: No data recorded Penetration/Aspiration Scale Score: No data recorded Compensatory Strategies: Compensatory Strategies Compensatory strategies: Yes Effortful swallow: Ineffective Ineffective Effortful Swallow: Thin liquid (Level 0); Mildly thick liquid (Level 2, nectar thick); Moderately thick liquid (Level 3, honey thick); Puree; Solid Multiple swallows: Effective; Ineffective Effective Multiple Swallows: Thin liquid (Level 0); Mildly  thick liquid (Level 2, nectar thick) Ineffective Multiple Swallows: Moderately thick liquid (Level 3, honey thick); Puree; Solid Chin tuck: Ineffective Ineffective Chin Tuck: Puree Liquid wash: Ineffective Ineffective Liquid Wash: Solid   General Information: Caregiver present: No  Diet Prior to this Study: NPO   Temperature : Normal   Respiratory Status: WFL   Supplemental O2: None (Room air)   History of Recent Intubation: No  Behavior/Cognition: Alert; Cooperative; Distractible; Requires cueing Self-Feeding Abilities: Able to self-feed Baseline vocal quality/speech: Dysphonic Volitional Cough: Able to elicit Volitional Swallow: Able to elicit Exam Limitations: Limited visibility Goal Planning: Prognosis for improved oropharyngeal function: Good Barriers to Reach Goals: Cognitive deficits No data recorded Patient/Family Stated Goal: None reported Consulted and agree with results and recommendations: Nurse; Pt unable/family or caregiver not available Pain: Pain Assessment Pain Assessment: No/denies pain Breathing: 0 Negative Vocalization: 0 Facial Expression: 0 Body Language: 0 Consolability: 0 PAINAD Score: 0 Facial Expression: 0 Body Movements: 0 Muscle Tension: 0 Compliance with ventilator (intubated pts.): N/A Vocalization (extubated pts.): 0 CPOT Total: 0 End of Session: Start Time:SLP Start Time (ACUTE ONLY): 0940 Stop Time: SLP Stop Time (ACUTE ONLY): 0957 Time Calculation:SLP Time Calculation (min) (ACUTE ONLY): 17 min Charges: SLP Evaluations $ SLP Speech Visit: 1 Visit SLP Evaluations $BSS Swallow: 1 Procedure $MBS Swallow: 1 Procedure SLP visit diagnosis: SLP Visit Diagnosis: Dysphagia, oropharyngeal phase (R13.12) Past Medical History: Past Medical History: Diagnosis Date  Arthritis   BPH (benign prostatic hyperplasia)   CAD S/P percutaneous coronary angioplasty 12/2009; 08/2010  a) 6/'22 - NSTEMI: PCI to LAD: Promus Element 2.5 mm x 15 mm DES; b) Cath for Angina: Prox LAD 60-70% pre-stent with FFR 0.82,  90% RVM -- Med Rx, EF 50-55%  Essential hypertension   Hyperlipidemia with target LDL less than 70   Statin intolerance  Non-ST elevation MI (NSTEMI) [facility]) June 2011  PCI to LAD  Pneumonia   DEC 2016  [place] WITH ANTIBIOTIC  Type 2 diabetes mellitus without complications [facility])  Past Surgical History: Past Surgical History: Procedure Laterality Date  CARDIAC CATHETERIZATION  12/2009  Proximal LAD stenosis followed by a significant 80-90% distal stenosis  CARDIAC CATHETERIZATION  February 2012   90% ostial RV marginal branch; 60-70% proximal LAD with widely patent distal stent. FFR 0.82; medical therapy  CATARACT EXTRACTION W/ INTRAOCULAR LENS  IMPLANT, BILATERAL    CORONARY ANGIOPLASTY  12/2009  PTCA to proximal LAD; PCI with Promus Element DES stent  2.5 mm x 15 mm  distalLAD - .for non-ST elevation MI  DENTAL SURGERY    LUMBAR LAMINECTOMY/DECOMPRESSION MICRODISCECTOMY N/A 08/09/2015  Procedure: Lumbar Four-Five, Lumbar Five- Sacral One Decompressive Lumbar Laminectomy with Resection of Synovial Cyst;  Surgeon: Erline Levine, MD;  Location: [place] [facility];  Service: Neurosurgery;  Laterality: N/A;  L4 to S1 Decompressive Lumbar Laminectomy  LUMBAR LAMINECTOMY/DECOMPRESSION MICRODISCECTOMY Right 01/21/2016  Procedure: Redo Right Lumbar Five-Sacral One Laminectomy for synovial cyst;  Surgeon: Erline Levine, MD;  Location: [place] [facility];  Service: Neurosurgery;  Laterality: Right;  right  LUMBAR LAMINECTOMY/DECOMPRESSION MICRODISCECTOMY Right 10/08/2017  Procedure: Right Lumbar three-four Laminectomy for synovial cyst;  Surgeon: Erline Levine, MD;  Location: [place];  Service: Neurosurgery;  Laterality: Right;  NASAL FRACTURE SURGERY    TRANSTHORACIC ECHOCARDIOGRAM  June 2011  - (EF not reported) Moderately dilated LV; moderate hypokinesis of anteroseptal and anterior wall consistent with MI. Grade 1 diastolic dysfunction. Mild to moderately dilated LA  VASECTOMY   Pat Adams,M.S., CCC-SLP 10/13/2022, 5:17 PM Assessment / Plan /  Recommendation Failed to redirect to the Timeline version of the REV[employer] SmartLink.   Failed to redirect to the Timeline version of the REVFS SmartLink.   Failed to redirect to the Timeline version of the REVFS SmartLink. Failed to redirect to the Timeline version of the REVFS SmartLink.    Pat Adams,M.S., CCC-SLP 10/13/2022, 5:05 PM                     DG Knee Complete 4 Views Left  Result Date: 09/28/2022 CLINICAL DATA:  fall EXAM: LEFT KNEE - COMPLETE 4+ VIEW COMPARISON:  None Available. FINDINGS: No fracture or dislocation. Small effusion in the suprapatellar bursa. Marginal spurs from the patellar articular surface, tibial plateau, and medial femoral condyle. There is marked narrowing of articular cartilage in the medial compartment. IMPRESSION: 1. 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 2. Small effusion. Electronically Signed   By: Lucrezia Europe M.D.   On: 09/20/2022 14:06   DG Hip Unilat With Pelvis 2-3 Views Left  Result Date: 10/14/2022 CLINICAL DATA:  fall EXAM: DG HIP (WITH OR WITHOUT PELVIS) 2-3V LEFT COMPARISON:  07/06/2022 FINDINGS: No fracture or dislocation. Bilateral hip DJD. Spondylitic changes in the visualized lower lumbar spine. Bilateral pelvic phleboliths. IMPRESSION: No acute findings. Bilateral hip DJD. Electronically Signed   By: Lucrezia Europe M.D.   On: 09/23/2022 14:03   DG Chest Port 1 View  Result Date: 09/30/2022 CLINICAL DATA:  Fall EXAM: PORTABLE CHEST - 1 VIEW COMPARISON:  07/06/2022 FINDINGS: Mild interstitial opacities throughout the left lung, and to a milder degree on the right in a perihilar distribution. Heart size normal.  Aortic Atherosclerosis (ICD10-170.0). Mild elevation of left diaphragmatic leaflet. No definite effusion. No evident pneumothorax. Visualized bones unremarkable. IMPRESSION: Mild asymmetric interstitial opacities, left worse than right. Electronically Signed   By: Lucrezia Europe M.D.   On: 10/14/2022 14:02   CT Cervical Spine Wo  Contrast  Result Date: 10/18/2022 CLINICAL DATA:  Neck trauma. EXAM: CT CERVICAL SPINE WITHOUT CONTRAST TECHNIQUE: Multidetector CT imaging of the cervical spine was performed without intravenous contrast. Multiplanar CT image reconstructions were also generated. RADIATION DOSE REDUCTION: This exam was performed according to the departmental dose-optimization program which includes automated exposure control, adjustment of the mA and/or kV according to patient size and/or use of iterative reconstruction technique. COMPARISON:  07/06/2022. FINDINGS: Alignment: Normal. Skull base and vertebrae: No acute fracture. No primary bone lesion or focal pathologic process. Soft tissues and spinal canal: No prevertebral fluid or swelling. No visible canal hematoma. Disc levels: Disc space narrowing with marginal osteophyte formation identified throughout the cervical spine and upper thoracic spine consistent with degenerative disc disease.  Osteoarthritis identified at C1-C2. Upper chest: Alveolar consolidation or volume loss left upper lobe. IMPRESSION: Degenerative changes. No acute traumatic abnormalities. Left upper lung alveolar density. Electronically Signed   By: Sammie Bench M.D.   On: 09/30/2022 13:19   CT Head Wo Contrast  Result Date: 10/01/2022 CLINICAL DATA:  Unwitnessed fall EXAM: CT HEAD WITHOUT CONTRAST TECHNIQUE: Contiguous axial images were obtained from the base of the skull through the vertex without intravenous contrast. RADIATION DOSE REDUCTION: This exam was performed according to the departmental dose-optimization program which includes automated exposure control, adjustment of the mA and/or kV according to patient size and/or use of iterative reconstruction technique. COMPARISON:  07/06/2022 FINDINGS: Brain: No evidence of acute infarction, hemorrhage, hydrocephalus, extra-axial collection or mass lesion/mass effect. Scattered low-density changes within the periventricular and subcortical white  matter most compatible with chronic microvascular ischemic change. Mild-moderate diffuse cerebral volume loss. Vascular: Atherosclerotic calcifications involving the large vessels of the skull base. No unexpected hyperdense vessel. Skull: Normal. Negative for fracture or focal lesion. Sinuses/Orbits: Chronic mild right-sided paranasal sinus disease. Mastoid air cells are clear. Other: None. IMPRESSION: 1. No acute intracranial findings. 2. Chronic microvascular ischemic change and cerebral volume loss. Electronically Signed   By: Davina Poke D.O.   On: 09/21/2022 13:14      Signature  -   Lala Lund M.D on 10/15/2022 at 9:40 AM   -  To page go to [email or web address]

## 2022-10-16 DIAGNOSIS — F03C0 Unspecified dementia, severe, without behavioral disturbance, psychotic disturbance, mood disturbance, and anxiety: Secondary | ICD-10-CM

## 2022-10-16 DIAGNOSIS — Z66 Do not resuscitate: Secondary | ICD-10-CM | POA: Diagnosis not present

## 2022-10-16 DIAGNOSIS — R579 Shock, unspecified: Secondary | ICD-10-CM | POA: Diagnosis not present

## 2022-10-16 DIAGNOSIS — Z7189 Other specified counseling: Secondary | ICD-10-CM | POA: Diagnosis not present

## 2022-10-16 DIAGNOSIS — R0682 Tachypnea, not elsewhere classified: Secondary | ICD-10-CM

## 2022-10-16 DIAGNOSIS — Z515 Encounter for palliative care: Secondary | ICD-10-CM | POA: Diagnosis not present

## 2022-10-16 DIAGNOSIS — J690 Pneumonitis due to inhalation of food and vomit: Secondary | ICD-10-CM | POA: Diagnosis not present

## 2022-10-16 LAB — CULTURE, BLOOD (SINGLE)
Culture: NO GROWTH
Special Requests: ADEQUATE

## 2022-10-16 MED ORDER — HYDROMORPHONE HCL-NACL 50-0.9 MG/50ML-% IV SOLN
1.0000 mg/h | INTRAVENOUS | Status: DC
  Administered 2022-10-16: 1 mg/h via INTRAVENOUS
  Filled 2022-10-16: qty 50

## 2022-10-16 MED ORDER — HYDROMORPHONE BOLUS VIA INFUSION: 1.0000 mg | INTRAVENOUS | Status: DC | PRN

## 2022-10-16 MED ORDER — HYDROMORPHONE HCL 1 MG/ML IJ SOLN
1.0000 mg | INTRAMUSCULAR | Status: DC | PRN
  Administered 2022-10-16: 1 mg via INTRAVENOUS
  Filled 2022-10-16: qty 1

## 2022-10-20 NOTE — Death Summary Note (Signed)
[facility] Death Note                                                                                                                                                                                               David [name], is a 87 y.o. male, DOB - 13-Nov-1935, [id number]  Admit date - 10/22/2022   Admitting Physician Chesley Mires, MD  Outpatient Primary MD for the patient is [name], MD  LOS - 5  Chief Complaint  Patient presents with   Fall       Notification: [name], MD notified of death of October 27, 2022   Admit Date:  Oct 22, 2022  Date of Death: Date of Death: Oct 27, 2022  Time of Death: Time of Death: 10/29/1132  Length of Stay: 5   Pronounced by - RN  History of present illness:   David [name] is a 87 y.o. male with a history of -  87 year old male with history of dementia, diabetes mellitus type 2, hypertension, CAD, BPH presented to the ED after he was found by family friend at 61 AM on 10/22/22 outside his home. Patient apparently fell and was outside his house all night. Patient was found to be hypothermic with temperature 91 degrees Fahrenheit. CPK 5984. Chest x-ray showed bilateral infiltrates left more than right, he was admitted to ICU for acute hypoxic respiratory failure secondary to aspiration pneumonia, sepsis, dysphagia, rhabdomyolysis, AKI all in the setting of dementia.  His stay was complicated by CHF.  He was transition to comfort measures per family wishes died no discomfort on 10/27/2022.   Final Diagnoses:  Cause if death - Pneumonia  Signature  -    Lala Lund M.D on 10/27/2022 at 11:42 AM   -  To page go to [email or web address]   Total clinical and documentation time for today Under 30 minutes   Last Note                                                                    PROGRESS NOTE  Patient Demographics:    David [name], is a 87 y.o. male, DOB - Oct 05, 1935, [id number]  Outpatient Primary MD for the patient is [name], MD    LOS - 5  Admit date - 09/22/2022    Chief Complaint  Patient presents with   Fall       Brief Narrative (HPI from H&P)   87 year old male with history of dementia, diabetes mellitus type 2, hypertension, CAD, BPH presented to the ED after he was found by family friend at 27 AM on 3/23 outside his home. Patient apparently fell and was outside his house all night. Patient was found to be hypothermic with temperature 91 degrees Fahrenheit. CPK 5984. Chest x-ray showed bilateral infiltrates left more than right, he was admitted to ICU for acute hypoxic respiratory failure secondary to aspiration pneumonia, sepsis, dysphagia, rhabdomyolysis, AKI all in the setting of dementia.  His stay was complicated by CHF.   Subjective:   Patient in bed somnolent unable to answer questions or follow commands.   Assessment  & Plan :   After discussions between palliative care team, family it was decided that patient should be transition to full comfort measures, this was done on 10/15/2022, he appears to be in no discomfort and looks like will pass away in the next 1 to 2 days.  Goal of care is comfort only.  Unknown comfort medications have been stopped.  Other medical issues addressed earlier this admission are below.     Advanced dementia, toxic and metabolic encephalopathy, deconditioning, dysphagia.  Unfortunately patient has very poor baseline and has developed aspiration pneumonia due to underlying dysphagia, currently on 5 L of nasal cannula oxygen with respiratory distress.  Long-term prognosis is poor, he is DNR, had detailed discussion with patient's daughter over the phone on 10/15/2022 she desires gentle medical  treatment if any further decline full comfort care.  Continue supportive care.  CT head and C-spine nonacute, remains at risk for encephalopathy and delirium, minimize narcotics and benzodiazepines.  Restraints as needed.  Acute hypoxic respiratory failure in the setting of aspiration pneumonia, dysphagia and now acute on chronic combined systolic and diastolic CHF EF on recent echo 30%. He has ongoing dysphagia and aspiration, on Unasyn, also developing CHF for which she has been placed on high-dose IV Lasix, overall prognosis is poor due to ongoing aspiration.  Continue beta-blocker, blood pressure too low to add ACE inhibitor or ARB.  Again prognosis is guarded.  Newly diagnosed A-fib RVR on 10/14/2022.  Was on Cardizem drip, currently in rate controlled, [place] vas 2 score is greater than 3 but patient is a poor candidate for anticoagulation and close to being comfort measures, continue Lopressor, as needed IV Cardizem, titrate off Cardizem drip and monitor.  Recent echo noted, TSH will be checked.    Hypertension.  Currently on beta-blocker.  Rhabdomyolysis with AKI.  Improved with hydration.  Sepsis, shock, hypovolemia.  Due to combination of aspiration pneumonia, exposure to elements by laying outside the hospital overnight, this problem has resolved.  Urinary retention.  Foley, Flomax on 10/15/2022.    Hypokalemia.  Replaced.    DM type II.  ISS.  CBG (last 3)  Recent Labs    10/15/22 0340 10/15/22 0842 10/15/22 1325  GLUCAP 114* 133* 151*         Condition - Extremely Guarded  Family Communication  : Daughter Ria Comment [phone]  on 10/15/2022  Code Status :  DNR  Consults  :  [place]  PUD  Prophylaxis :    Procedures  :           Disposition Plan  :    Status is: Inpatient   DVT Prophylaxis  :      Lab Results  Component Value Date   PLT 234 10/15/2022    Diet :  Diet Order             Diet clear liquid Room service appropriate? Yes; Fluid  consistency: Nectar Thick  Diet effective now                    Inpatient Medications  Scheduled Meds:  nicotine  21 mg Transdermal Daily   Continuous Infusions:  sodium chloride     HYDROmorphone 1 mg/hr (23-Oct-2022 1125)   PRN Meds:.acetaminophen **OR** acetaminophen, antiseptic oral rinse, glycopyrrolate **OR** glycopyrrolate **OR** glycopyrrolate, haloperidol **OR** haloperidol **OR** haloperidol lactate, HYDROmorphone, HYDROmorphone (DILAUDID) injection, ondansetron **OR** ondansetron (ZOFRAN) IV, polyvinyl alcohol    Objective:   Vitals:   10/15/22 0300 10/15/22 0700 10/15/22 0746 10/15/22 1924  BP: (!) 116/94 107/65  (!) 133/91  Pulse: 94 (!) 56  (!) 112  Resp: (!) 21 (!) 24  11  Temp: 97.6 F (36.4 C) 98.3 F (36.8 C)  97.6 F (36.4 C)  TempSrc: Oral Axillary  Axillary  SpO2: 93% 100% 96% 90%  Weight:      Height:        Wt Readings from Last 3 Encounters:  10/12/22 78 kg  09/10/22 75.8 kg  07/16/22 77.2 kg     Intake/Output Summary (Last 24 hours) at 10-23-22 1142 Last data filed at 23-Oct-2022 0500 Gross per 24 hour  Intake --  Output 760 ml  Net -760 ml     Physical Exam  Somnolent and minimally arousable, appears to be in no distress, on full comfort measures now [place].AT,PERRAL Supple Neck, No JVD,   Symmetrical Chest wall movement, Good air movement bilaterally, coarse bilateral breath sounds and crackles RRR,No Gallops,Rubs or new Murmurs,  +ve B.Sounds, Abd Soft, No tenderness,   No Cyanosis, Clubbing or edema        Data Review:    Recent Labs  Lab 10/01/2022 1140 10/12/22 0055 10/13/22 0633 10/14/22 0359 10/15/22 0558  WBC 28.7* 15.7* 12.9* 15.0* 15.3*  HGB 15.2 13.1 14.3 14.7 15.0  HCT 44.4 37.8* 41.3 44.5 45.2  PLT 288 198 214 252 234  MCV 94.3 91.1 91.2 93.3 94.4  MCH 32.3 31.6 31.6 30.8 31.3  MCHC 34.2 34.7 34.6 33.0 33.2  RDW 13.0 13.1 13.4 13.5 13.7  LYMPHSABS 1.1  --   --   --   --   MONOABS 3.3*  --   --   --   --    EOSABS 0.0  --   --   --   --   BASOSABS 0.1  --   --   --   --     Recent Labs  Lab 09/25/2022 1140 10/10/2022 1415 10/10/2022 2039 10/12/22 0055 10/13/22 0633 10/14/22 0359 10/15/22 0558  NA 131*  --  134* 133* 136 141 145  K 5.2*  --  3.9 3.9 4.1 4.5 3.4*  CL 95*  --  102 101 106 107 108  CO2 21*  --  16* 16* 16* 21* 19*  ANIONGAP 15  --  16* 16* 14 13 18*  GLUCOSE 116*  --  106* 107* 133* 152* 149*  BUN 24*  --  26* 26* 24* 41*  46*  CREATININE 1.36*  --  1.18 1.04 1.00 1.15 1.22  AST 121*  --   --   --   --  204*  --   ALT 33  --   --   --   --  116*  --   ALKPHOS 41  --   --   --   --  55  --   BILITOT 1.4*  --   --   --   --  1.6*  --   ALBUMIN 3.8  --   --   --   --  3.4*  --   CRP  --   --   --   --   --   --  14.8*  PROCALCITON  --   --   --   --   --  0.62 0.60  LATICACIDVEN 3.8* 3.4*  --   --   --   --   --   TSH  --   --   --   --   --   --  1.988  BNP  --   --   --   --   --  2,497.1* 1,650.5*  MG  --   --   --   --  1.9  --  2.3  CALCIUM 9.0  --  8.4* 8.3* 9.1 9.3 9.2      Recent Labs  Lab 10/12/2022 1140 09/27/2022 1415 09/27/2022 2039 10/12/22 0055 10/13/22 0633 10/14/22 0359 10/15/22 0558  CRP  --   --   --   --   --   --  14.8*  PROCALCITON  --   --   --   --   --  0.62 0.60  LATICACIDVEN 3.8* 3.4*  --   --   --   --   --   TSH  --   --   --   --   --   --  1.988  BNP  --   --   --   --   --  2,497.1* 1,650.5*  MG  --   --   --   --  1.9  --  2.3  CALCIUM 9.0  --  8.4* 8.3* 9.1 9.3 9.2   Lab Results  Component Value Date   HGBA1C 5.5 09/10/2022    Radiology Reports DG Chest Port 1 View  Result Date: 10/15/2022 CLINICAL DATA:  Aspiration pneumonia EXAM: PORTABLE CHEST 1 VIEW COMPARISON:  Yesterday FINDINGS: Severe bilateral airspace disease. Stable heart size which is within normal limits for technique. No gross effusion. No visible pneumothorax. Artifact from EKG leads. IMPRESSION: Unchanged severe airspace disease. Electronically Signed   By:  Jorje Guild M.D.   On: 10/15/2022 06:54   DG Chest Port 1 View  Result Date: 10/14/2022 CLINICAL DATA:  Hypoxia.  Aspiration pneumonia. EXAM: PORTABLE CHEST 1 VIEW COMPARISON:  10/10/2022 FINDINGS: Artifact overlies the chest. Cardiomegaly and aortic atherosclerosis as seen previously. Worsening of diffuse pulmonary opacity most consistent with pulmonary edema. Widespread pneumonia is possible. Aspiration is usually more focal or patchy, but is not excluded. There is more focal volume loss in the left lower lobe. IMPRESSION: Worsening of diffuse pulmonary opacity most consistent with pulmonary edema. Widespread pneumonia is possible. Aspiration is usually more focal or patchy, but is not excluded, particularly in the left lower lobe where density is more pronounced. Electronically Signed   By: Nelson Chimes M.D.   On: 10/14/2022 08:43  DG Swallowing Func-Speech Pathology  Result Date: 10/13/2022 Table formatting from the original result was not included. Modified Barium Swallow Study Patient Details Name: David [name] MRN: [id number] Date of Birth: [birth year] Today's Date: 10/13/2022 HPI/PMH: HPI: 87 yo male was found by family friend at 75 am on 3/23 outside his home after falling.  Last know contact was 5 pm on 3/22.  Noted to have temp 36F, lactic acid 3.8, CPK 5984.  CXR showed bilateral infiltrates Lt > Rt.  Started on ABx, pressors, and IV fluids.  PCCM asked to admit to ICU; CT head on 10/15/2022 indicated Chronic microvascular ischemic change and cerebral volume loss; CXR on 10/12/22 indicated Mild asymmetric interstitial opacities, left worse than right; ST consulted for BSE with pt exhibiting overt s/sx of aspiration/regurgitation with most consistencies during assessment on 10/12/22.  MBS generated to assess overall swallow function. Clinical Impression: Clinical Impression: Pt exhibited an Mild-moderate oropharyngeal dysphagia c/b slight oral holding with trace residue lining the oral structures  and a swallow initiation varying from triggering at the valleculae-pyriform sinuses. Pharyngeal phase of the swallow was moderately delayed with decreased tongue base retraction,reduced pharyngeal stripping wave, partial epiglottic inversion/movement, partial anterior hyoid movement and incomplete laryngeal vestibule closure noted with most consistencies d/t residue remaining in valleculae/pyriform sinuses.  Compensatory strategies utilized included: chin tuck, repetitive dry swallows, liquid wash and effortful swallow which did not remediate imoderate vallecular/pyriform sinus residue.  Pt aspirated thin via cup sip (PAS 4) with cough clearing laryngeal vestibule.  Nectar thick liquids did not result in aspiration when utilized in isolation (PAS 2), but when used as a liquid wash, the nectar thick liquids yielded (PAS 4).  Pt uses "hocking" material up during meals and expels from oral cavity and/or coughs to clear the airway. Esophageal component cannot be ruled out as this was difficult to assess d/t pt positioning.  Due to moderate risk for aspiration, recommend initiating a conservative diet of nectar-thickened liquids ONLY using compensatory swallowing strategies and FULL supervision with meals.  Dysphagia tx recommended to remediate swallow function addressing above deficits in acute setting. Factors that may increase risk of adverse event in presence of aspiration ([place] 2021): Factors that may increase risk of adverse event in presence of aspiration ([place] 2021): Poor general health and/or compromised immunity; Respiratory or GI disease; Reduced cognitive function; Frail or deconditioned; Dependence for feeding and/or oral hygiene; Frequent aspiration of large volumes Recommendations/Plan: Swallowing Evaluation Recommendations Swallowing Evaluation Recommendations Recommendations: PO diet (nectar-thickened liquids) PO Diet Recommendation: Mildly thick liquids (Level 2, nectar thick)  Liquid Administration via: Cup; Spoon; No straw Medication Administration: Other (Comment) (whole with nectar as tolerated) Supervision: Other (comment) Swallowing strategies  : Minimize environmental distractions; Slow rate; Small bites/sips; effortful swallow; Multiple dry swallows after each bite/sip Oral care recommendations: Oral care BID (2x/day); Use suctioning for oral care Recommended consults: Other(comment) (potential for esophageal consult) Treatment Plan Treatment Plan Treatment recommendations: Therapy as outlined in treatment plan below Follow-up recommendations: Other (comment) (TBD) Functional status assessment: Patient has had a recent decline in their functional status and demonstrates the ability to make significant improvements in function in a reasonable and predictable amount of time. Treatment frequency: Min 2x/week Treatment duration: 1 week Interventions: Aspiration precaution training; Compensatory techniques; Patient/family education; Diet toleration management by SLP; Trials of upgraded texture/liquids Recommendations Recommendations for follow up therapy are one component of a multi-disciplinary discharge planning process, led by the attending physician.  Recommendations may be updated based on  patient status, additional functional criteria and insurance authorization. Assessment: Orofacial Exam: Orofacial Exam Oral Cavity: Oral Hygiene: Xerostomia Oral Cavity - Dentition: Adequate natural dentition; Missing dentition Orofacial Anatomy: WFL Oral Motor/Sensory Function: WFL Anatomy: Anatomy: WFL Boluses Administered: No data recorded Oral Impairment Domain: Oral Impairment Domain Lip Closure: No labial escape Tongue control during bolus hold: Cohesive bolus between tongue to palatal seal Bolus preparation/mastication: Slow prolonged chewing/mashing with complete recollection Bolus transport/lingual motion: Brisk tongue motion Oral residue: Trace residue lining oral structures Location of  oral residue : Tongue (min) Initiation of pharyngeal swallow : Pyriform sinuses  Pharyngeal Impairment Domain: Pharyngeal Impairment Domain Soft palate elevation: No bolus between soft palate (SP)/pharyngeal wall (PW) Laryngeal elevation: Partial superior movement of thyroid cartilage/partial approximation of arytenoids to epiglottic petiole Anterior hyoid excursion: Partial anterior movement Epiglottic movement: Partial inversion Laryngeal vestibule closure: Incomplete, narrow column air/contrast in laryngeal vestibule Pharyngeal stripping wave : Present - diminished Pharyngeal contraction (A/P view only): N/A Pharyngoesophageal segment opening: Partial distention/partial duration, partial obstruction of flow Tongue base retraction: Trace column of contrast or air between tongue base and PPW Pharyngeal residue: Majority of contrast within or on pharyngeal structures Location of pharyngeal residue: Valleculae; Pyriform sinuses  Esophageal Impairment Domain: No data recorded Pill: No data recorded Penetration/Aspiration Scale Score: No data recorded Compensatory Strategies: Compensatory Strategies Compensatory strategies: Yes Effortful swallow: Ineffective Ineffective Effortful Swallow: Thin liquid (Level 0); Mildly thick liquid (Level 2, nectar thick); Moderately thick liquid (Level 3, honey thick); Puree; Solid Multiple swallows: Effective; Ineffective Effective Multiple Swallows: Thin liquid (Level 0); Mildly thick liquid (Level 2, nectar thick) Ineffective Multiple Swallows: Moderately thick liquid (Level 3, honey thick); Puree; Solid Chin tuck: Ineffective Ineffective Chin Tuck: Puree Liquid wash: Ineffective Ineffective Liquid Wash: Solid   General Information: Caregiver present: No  Diet Prior to this Study: NPO   Temperature : Normal   Respiratory Status: WFL   Supplemental O2: None (Room air)   History of Recent Intubation: No  Behavior/Cognition: Alert; Cooperative; Distractible; Requires cueing  Self-Feeding Abilities: Able to self-feed Baseline vocal quality/speech: Dysphonic Volitional Cough: Able to elicit Volitional Swallow: Able to elicit Exam Limitations: Limited visibility Goal Planning: Prognosis for improved oropharyngeal function: Good Barriers to Reach Goals: Cognitive deficits No data recorded Patient/Family Stated Goal: None reported Consulted and agree with results and recommendations: Nurse; Pt unable/family or caregiver not available Pain: Pain Assessment Pain Assessment: No/denies pain Breathing: 0 Negative Vocalization: 0 Facial Expression: 0 Body Language: 0 Consolability: 0 PAINAD Score: 0 Facial Expression: 0 Body Movements: 0 Muscle Tension: 0 Compliance with ventilator (intubated pts.): N/A Vocalization (extubated pts.): 0 CPOT Total: 0 End of Session: Start Time:SLP Start Time (ACUTE ONLY): 0940 Stop Time: SLP Stop Time (ACUTE ONLY): 0957 Time Calculation:SLP Time Calculation (min) (ACUTE ONLY): 17 min Charges: SLP Evaluations $ SLP Speech Visit: 1 Visit SLP Evaluations $BSS Swallow: 1 Procedure $MBS Swallow: 1 Procedure SLP visit diagnosis: SLP Visit Diagnosis: Dysphagia, oropharyngeal phase (R13.12) Past Medical History: Past Medical History: Diagnosis Date  Arthritis   BPH (benign prostatic hyperplasia)   CAD S/P percutaneous coronary angioplasty 12/2009; 08/2010  a) 6/'22 - NSTEMI: PCI to LAD: Promus Element 2.5 mm x 15 mm DES; b) Cath for Angina: Prox LAD 60-70% pre-stent with FFR 0.82, 90% RVM -- Med Rx, EF 50-55%  Essential hypertension   Hyperlipidemia with target LDL less than 70   Statin intolerance  Non-ST elevation MI (NSTEMI) [facility]) June 2011  PCI to LAD  Pneumonia   DEC 2016  TX WITH ANTIBIOTIC  Type 2 diabetes mellitus without complications [facility])  Past Surgical History: Past Surgical History: Procedure Laterality Date  CARDIAC CATHETERIZATION  12/2009  Proximal LAD stenosis followed by a significant 80-90% distal stenosis  CARDIAC CATHETERIZATION  February 2012   90%  ostial RV marginal branch; 60-70% proximal LAD with widely patent distal stent. FFR 0.82; medical therapy  CATARACT EXTRACTION W/ INTRAOCULAR LENS  IMPLANT, BILATERAL    CORONARY ANGIOPLASTY  12/2009  PTCA to proximal LAD; PCI with Promus Element DES stent  2.5 mm x 15 mm  distalLAD - .for non-ST elevation MI  DENTAL SURGERY    LUMBAR LAMINECTOMY/DECOMPRESSION MICRODISCECTOMY N/A 08/09/2015  Procedure: Lumbar Four-Five, Lumbar Five- Sacral One Decompressive Lumbar Laminectomy with Resection of Synovial Cyst;  Surgeon: Erline Levine, MD;  Location: [place] [facility];  Service: Neurosurgery;  Laterality: N/A;  L4 to S1 Decompressive Lumbar Laminectomy  LUMBAR LAMINECTOMY/DECOMPRESSION MICRODISCECTOMY Right 01/21/2016  Procedure: Redo Right Lumbar Five-Sacral One Laminectomy for synovial cyst;  Surgeon: Erline Levine, MD;  Location: [place] [facility];  Service: Neurosurgery;  Laterality: Right;  right  LUMBAR LAMINECTOMY/DECOMPRESSION MICRODISCECTOMY Right 10/08/2017  Procedure: Right Lumbar three-four Laminectomy for synovial cyst;  Surgeon: Erline Levine, MD;  Location: [place];  Service: Neurosurgery;  Laterality: Right;  NASAL FRACTURE SURGERY    TRANSTHORACIC ECHOCARDIOGRAM  June 2011  - (EF not reported) Moderately dilated LV; moderate hypokinesis of anteroseptal and anterior wall consistent with MI. Grade 1 diastolic dysfunction. Mild to moderately dilated LA  VASECTOMY   Pat Adams,M.S., CCC-SLP 10/13/2022, 5:06 PM  Modified Barium Swallow Study Patient Details Name: David [name] MRN: [id number] Date of Birth: [birth year] Today's Date: 10/13/2022 HPI/PMH: HPI: 87 yo male was found by family friend at 45 am on 3/23 outside his home after falling.  Last know contact was 5 pm on 3/22.  Noted to have temp 64F, lactic acid 3.8, CPK 5984.  CXR showed bilateral infiltrates Lt > Rt.  Started on ABx, pressors, and IV fluids.  PCCM asked to admit to ICU; CT head on 10/14/2022 indicated Chronic microvascular ischemic change and cerebral volume  loss; CXR on 10/12/22 indicated Mild asymmetric interstitial opacities, left worse than right; ST consulted for BSE with pt exhibiting overt s/sx of aspiration/regurgitation with most consistencies during assessment on 10/12/22.  MBS generated to assess overall swallow function. Clinical Impression: Clinical Impression: Pt exhibited an Mild-moderate oropharyngeal dysphagia c/b slight oral holding with trace residue lining the oral structures and a swallow initiation varying from triggering at the valleculae-pyriform sinuses. Pharyngeal phase of the swallow was moderately delayed with decreased tongue base retraction,reduced pharyngeal stripping wave, partial epiglottic inversion/movement, partial anterior hyoid movement and incomplete laryngeal vestibule closure noted with most consistencies d/t residue remaining in valleculae/pyriform sinuses.  Compensatory strategies utilized included: chin tuck, repetitive dry swallows, liquid wash and effortful swallow which did not remediate imoderate vallecular/pyriform sinus residue.  Pt aspirated thin via cup sip (PAS 4) with cough clearing laryngeal vestibule.  Nectar thick liquids did not result in aspiration when utilized in isolation (PAS 2), but when used as a liquid wash, the nectar thick liquids yielded (PAS 4).  Pt uses "hocking" material up during meals and expels from oral cavity and/or coughs to clear the airway. Esophageal component cannot be ruled out as this was difficult to assess d/t pt positioning.  Due to moderate risk for aspiration, recommend initiating a conservative diet of nectar-thickened liquids ONLY using compensatory swallowing strategies and FULL supervision with meals.  Dysphagia tx  recommended to remediate swallow function addressing above deficits in acute setting. Factors that may increase risk of adverse event in presence of aspiration ([place] 2021): Factors that may increase risk of adverse event in presence of aspiration ([place] 2021): Poor general health and/or compromised immunity; Respiratory or GI disease; Reduced cognitive function; Frail or deconditioned; Dependence for feeding and/or oral hygiene; Frequent aspiration of large volumes Recommendations/Plan: Swallowing Evaluation Recommendations Swallowing Evaluation Recommendations Recommendations: PO diet (nectar-thickened liquids) PO Diet Recommendation: Mildly thick liquids (Level 2, nectar thick) Liquid Administration via: Cup; Spoon; No straw Medication Administration: Other (Comment) (whole with nectar as tolerated) Supervision: Other (comment) Swallowing strategies  : Minimize environmental distractions; Slow rate; Small bites/sips; effortful swallow; Multiple dry swallows after each bite/sip Oral care recommendations: Oral care BID (2x/day); Use suctioning for oral care Recommended consults: Other(comment) (potential for esophageal consult) Treatment Plan Treatment Plan Treatment recommendations: Therapy as outlined in treatment plan below Follow-up recommendations: Other (comment) (TBD) Functional status assessment: Patient has had a recent decline in their functional status and demonstrates the ability to make significant improvements in function in a reasonable and predictable amount of time. Treatment frequency: Min 2x/week Treatment duration: 1 week Interventions: Aspiration precaution training; Compensatory techniques; Patient/family education; Diet toleration management by SLP; Trials of upgraded texture/liquids Recommendations Recommendations for follow up therapy are one component of a multi-disciplinary discharge planning process, led by the attending physician.  Recommendations may be updated based on patient status, additional functional criteria and insurance authorization. Assessment: Orofacial Exam: Orofacial Exam Oral Cavity: Oral Hygiene: Xerostomia Oral Cavity - Dentition: Adequate natural dentition; Missing dentition Orofacial Anatomy: WFL Oral  Motor/Sensory Function: WFL Anatomy: Anatomy: WFL Boluses Administered: No data recorded Oral Impairment Domain: Oral Impairment Domain Lip Closure: No labial escape Tongue control during bolus hold: Cohesive bolus between tongue to palatal seal Bolus preparation/mastication: Slow prolonged chewing/mashing with complete recollection Bolus transport/lingual motion: Brisk tongue motion Oral residue: Trace residue lining oral structures Location of oral residue : Tongue (min) Initiation of pharyngeal swallow : Pyriform sinuses  Pharyngeal Impairment Domain: Pharyngeal Impairment Domain Soft palate elevation: No bolus between soft palate (SP)/pharyngeal wall (PW) Laryngeal elevation: Partial superior movement of thyroid cartilage/partial approximation of arytenoids to epiglottic petiole Anterior hyoid excursion: Partial anterior movement Epiglottic movement: Partial inversion Laryngeal vestibule closure: Incomplete, narrow column air/contrast in laryngeal vestibule Pharyngeal stripping wave : Present - diminished Pharyngeal contraction (A/P view only): N/A Pharyngoesophageal segment opening: Partial distention/partial duration, partial obstruction of flow Tongue base retraction: Trace column of contrast or air between tongue base and PPW Pharyngeal residue: Majority of contrast within or on pharyngeal structures Location of pharyngeal residue: Valleculae; Pyriform sinuses  Esophageal Impairment Domain: No data recorded Pill: No data recorded Penetration/Aspiration Scale Score: No data recorded Compensatory Strategies: Compensatory Strategies Compensatory strategies: Yes Effortful swallow: Ineffective Ineffective Effortful Swallow: Thin liquid (Level 0); Mildly thick liquid (Level 2, nectar thick); Moderately thick liquid (Level 3, honey thick); Puree; Solid Multiple swallows: Effective; Ineffective Effective Multiple Swallows: Thin liquid (Level 0); Mildly thick liquid (Level 2, nectar thick) Ineffective Multiple  Swallows: Moderately thick liquid (Level 3, honey thick); Puree; Solid Chin tuck: Ineffective Ineffective Chin Tuck: Puree Liquid wash: Ineffective Ineffective Liquid Wash: Solid   General Information: Caregiver present: No  Diet Prior to this Study: NPO   Temperature : Normal   Respiratory Status: WFL   Supplemental O2: None (Room air)   History of Recent Intubation: No  Behavior/Cognition: Alert; Cooperative; Distractible; Requires cueing Self-Feeding Abilities:  Able to self-feed Baseline vocal quality/speech: Dysphonic Volitional Cough: Able to elicit Volitional Swallow: Able to elicit Exam Limitations: Limited visibility Goal Planning: Prognosis for improved oropharyngeal function: Good Barriers to Reach Goals: Cognitive deficits No data recorded Patient/Family Stated Goal: None reported Consulted and agree with results and recommendations: Nurse; Pt unable/family or caregiver not available Pain: Pain Assessment Pain Assessment: No/denies pain Breathing: 0 Negative Vocalization: 0 Facial Expression: 0 Body Language: 0 Consolability: 0 PAINAD Score: 0 Facial Expression: 0 Body Movements: 0 Muscle Tension: 0 Compliance with ventilator (intubated pts.): N/A Vocalization (extubated pts.): 0 CPOT Total: 0 End of Session: Start Time:SLP Start Time (ACUTE ONLY): 0940 Stop Time: SLP Stop Time (ACUTE ONLY): 0957 Time Calculation:SLP Time Calculation (min) (ACUTE ONLY): 17 min Charges: SLP Evaluations $ SLP Speech Visit: 1 Visit SLP Evaluations $BSS Swallow: 1 Procedure $MBS Swallow: 1 Procedure SLP visit diagnosis: SLP Visit Diagnosis: Dysphagia, oropharyngeal phase (R13.12) Past Medical History: Past Medical History: Diagnosis Date  Arthritis   BPH (benign prostatic hyperplasia)   CAD S/P percutaneous coronary angioplasty 12/2009; 08/2010  a) 6/'22 - NSTEMI: PCI to LAD: Promus Element 2.5 mm x 15 mm DES; b) Cath for Angina: Prox LAD 60-70% pre-stent with FFR 0.82, 90% RVM -- Med Rx, EF 50-55%  Essential hypertension    Hyperlipidemia with target LDL less than 70   Statin intolerance  Non-ST elevation MI (NSTEMI) [facility]) June 2011  PCI to LAD  Pneumonia   DEC 2016  TX WITH ANTIBIOTIC  Type 2 diabetes mellitus without complications [facility])  Past Surgical History: Past Surgical History: Procedure Laterality Date  CARDIAC CATHETERIZATION  12/2009  Proximal LAD stenosis followed by a significant 80-90% distal stenosis  CARDIAC CATHETERIZATION  February 2012   90% ostial RV marginal branch; 60-70% proximal LAD with widely patent distal stent. FFR 0.82; medical therapy  CATARACT EXTRACTION W/ INTRAOCULAR LENS  IMPLANT, BILATERAL    CORONARY ANGIOPLASTY  12/2009  PTCA to proximal LAD; PCI with Promus Element DES stent  2.5 mm x 15 mm  distalLAD - .for non-ST elevation MI  DENTAL SURGERY    LUMBAR LAMINECTOMY/DECOMPRESSION MICRODISCECTOMY N/A 08/09/2015  Procedure: Lumbar Four-Five, Lumbar Five- Sacral One Decompressive Lumbar Laminectomy with Resection of Synovial Cyst;  Surgeon: Erline Levine, MD;  Location: [place] [facility];  Service: Neurosurgery;  Laterality: N/A;  L4 to S1 Decompressive Lumbar Laminectomy  LUMBAR LAMINECTOMY/DECOMPRESSION MICRODISCECTOMY Right 01/21/2016  Procedure: Redo Right Lumbar Five-Sacral One Laminectomy for synovial cyst;  Surgeon: Erline Levine, MD;  Location: [place] [facility];  Service: Neurosurgery;  Laterality: Right;  right  LUMBAR LAMINECTOMY/DECOMPRESSION MICRODISCECTOMY Right 10/08/2017  Procedure: Right Lumbar three-four Laminectomy for synovial cyst;  Surgeon: Erline Levine, MD;  Location: [place];  Service: Neurosurgery;  Laterality: Right;  NASAL FRACTURE SURGERY    TRANSTHORACIC ECHOCARDIOGRAM  June 2011  - (EF not reported) Moderately dilated LV; moderate hypokinesis of anteroseptal and anterior wall consistent with MI. Grade 1 diastolic dysfunction. Mild to moderately dilated LA  VASECTOMY   Pat Adams,M.S., CCC-SLP 10/13/2022, 5:17 PM Assessment / Plan / Recommendation Failed to redirect to the Timeline version of  the REV[employer] SmartLink.   Failed to redirect to the Timeline version of the REVFS SmartLink.   Failed to redirect to the Timeline version of the REVFS SmartLink. Failed to redirect to the Timeline version of the REVFS SmartLink.    Pat Adams,M.S., CCC-SLP 10/13/2022, 5:05 PM  Signature  -   Lala Lund M.D on Nov 10, 2022 at 11:42 AM   -  To page go to [email or web address]

## 2022-10-20 NOTE — Progress Notes (Signed)
PROGRESS NOTE                                                                                                                                                                                                             Patient Demographics:    David [name], is a 87 y.o. male, DOB - 1936/06/14, [id number]  Outpatient Primary MD for the patient is [name], MD    LOS - 4  Admit date - 10/13/2022    Chief Complaint  Patient presents with   Fall       Brief Narrative (HPI from H&P)   87 year old male with history of dementia, diabetes mellitus type 2, hypertension, CAD, BPH presented to the ED after he was found by family friend at 57 AM on 3/23 outside his home. Patient apparently fell and was outside his house all night. Patient was found to be hypothermic with temperature 91 degrees Fahrenheit. CPK 5984. Chest x-ray showed bilateral infiltrates left more than right, he was admitted to ICU for acute hypoxic respiratory failure secondary to aspiration pneumonia, sepsis, dysphagia, rhabdomyolysis, AKI all in the setting of dementia.  His stay was complicated by CHF.   Subjective:   Patient in bed somnolent unable to answer questions or follow commands.   Assessment  & Plan :   After discussions between palliative care team, family it was decided that patient should be transition to full comfort measures, this was done on 10/15/2022, he appears to be in no discomfort and looks like will pass away in the next 1 to 2 days.  Goal of care is comfort only.  Unknown comfort medications have been stopped.  Other medical issues addressed earlier this admission are below.     Advanced dementia, toxic and metabolic encephalopathy, deconditioning, dysphagia.  Unfortunately patient has very poor baseline and has developed aspiration pneumonia due to underlying dysphagia, currently on 5 L of nasal cannula oxygen with respiratory  distress.  Long-term prognosis is poor, he is DNR, had detailed discussion with patient's daughter over the phone on 10/15/2022 she desires gentle medical treatment if any further decline full comfort care.  Continue supportive care.  CT head and C-spine nonacute, remains at risk for encephalopathy and delirium, minimize narcotics and benzodiazepines.  Restraints as needed.  Acute hypoxic respiratory failure in the setting of aspiration pneumonia, dysphagia and  now acute on chronic combined systolic and diastolic CHF EF on recent echo 30%. He has ongoing dysphagia and aspiration, on Unasyn, also developing CHF for which she has been placed on high-dose IV Lasix, overall prognosis is poor due to ongoing aspiration.  Continue beta-blocker, blood pressure too low to add ACE inhibitor or ARB.  Again prognosis is guarded.  Newly diagnosed A-fib RVR on 10/14/2022.  Was on Cardizem drip, currently in rate controlled, [place] vas 2 score is greater than 3 but patient is a poor candidate for anticoagulation and close to being comfort measures, continue Lopressor, as needed IV Cardizem, titrate off Cardizem drip and monitor.  Recent echo noted, TSH will be checked.    Hypertension.  Currently on beta-blocker.  Rhabdomyolysis with AKI.  Improved with hydration.  Sepsis, shock, hypovolemia.  Due to combination of aspiration pneumonia, exposure to elements by laying outside the hospital overnight, this problem has resolved.  Urinary retention.  Foley, Flomax on 10/15/2022.    Hypokalemia.  Replaced.    DM type II.  ISS.  CBG (last 3)  Recent Labs    10/14/22 2304 10/15/22 0340 10/15/22 0842  GLUCAP 115* 114* 133*         Condition - Extremely Guarded  Family Communication  : Daughter Ria Comment [phone]  on 10/15/2022  Code Status :  DNR  Consults  :  Pall Care  PUD Prophylaxis :    Procedures  :           Disposition Plan  :    Status is: Inpatient   DVT Prophylaxis  :    heparin  injection 5,000 Units Start: 10/06/2022 1600  Lab Results  Component Value Date   PLT 234 10/15/2022    Diet :  Diet Order             Diet clear liquid Room service appropriate? Yes; Fluid consistency: Nectar Thick  Diet effective now                    Inpatient Medications  Scheduled Meds:  arformoterol  15 mcg Nebulization BID   budesonide (PULMICORT) nebulizer solution  0.5 mg Nebulization BID   Chlorhexidine Gluconate Cloth  6 each Topical Daily   heparin injection (subcutaneous)  5,000 Units Subcutaneous Q8H   insulin aspart  0-15 Units Subcutaneous Q4H   ipratropium-albuterol  3 mL Nebulization TID   metoprolol tartrate  5 mg Intravenous Q6H   nicotine  21 mg Transdermal Daily   mouth rinse  15 mL Mouth Rinse 4 times per day   tamsulosin  0.4 mg Oral Daily   Continuous Infusions:  sodium chloride     ampicillin-sulbactam (UNASYN) IV 3 g (10/15/22 0218)   diltiazem (CARDIZEM) infusion 5 mg/hr (10/15/22 0931)   PRN Meds:.HYDROmorphone (DILAUDID) injection, mouth rinse    Objective:   Vitals:   10/15/22 0200 10/15/22 0300 10/15/22 0700 10/15/22 0746  BP: 115/69 (!) 116/94 107/65   Pulse: 68 94 (!) 56   Resp: (!) 23 (!) 21 (!) 24   Temp: 97.9 F (36.6 C) 97.6 F (36.4 C) 98.3 F (36.8 C)   TempSrc: Oral Oral Axillary   SpO2: 92% 93% 100% 96%  Weight:      Height:        Wt Readings from Last 3 Encounters:  10/12/22 78 kg  09/10/22 75.8 kg  07/16/22 77.2 kg     Intake/Output Summary (Last 24 hours) at 10/15/2022 0940 Last data filed at  10/15/2022 0500 Gross per 24 hour  Intake 375.12 ml  Output 1525 ml  Net -1149.88 ml     Physical Exam  Somnolent and minimally arousable, appears to be in no distress, on full comfort measures now [place].AT,PERRAL Supple Neck, No JVD,   Symmetrical Chest wall movement, Good air movement bilaterally, coarse bilateral breath sounds and crackles RRR,No Gallops,Rubs or new Murmurs,  +ve B.Sounds, Abd Soft, No  tenderness,   No Cyanosis, Clubbing or edema        Data Review:    Recent Labs  Lab 10/03/2022 1140 10/12/22 0055 10/13/22 0633 10/14/22 0359 10/15/22 0558  WBC 28.7* 15.7* 12.9* 15.0* 15.3*  HGB 15.2 13.1 14.3 14.7 15.0  HCT 44.4 37.8* 41.3 44.5 45.2  PLT 288 198 214 252 234  MCV 94.3 91.1 91.2 93.3 94.4  MCH 32.3 31.6 31.6 30.8 31.3  MCHC 34.2 34.7 34.6 33.0 33.2  RDW 13.0 13.1 13.4 13.5 13.7  LYMPHSABS 1.1  --   --   --   --   MONOABS 3.3*  --   --   --   --   EOSABS 0.0  --   --   --   --   BASOSABS 0.1  --   --   --   --     Recent Labs  Lab 10/18/2022 1140 10/09/2022 1415 10/03/2022 2039 10/12/22 0055 10/13/22 0633 10/14/22 0359 10/15/22 0558  NA 131*  --  134* 133* 136 141 145  K 5.2*  --  3.9 3.9 4.1 4.5 3.4*  CL 95*  --  102 101 106 107 108  CO2 21*  --  16* 16* 16* 21* 19*  ANIONGAP 15  --  16* 16* 14 13 18*  GLUCOSE 116*  --  106* 107* 133* 152* 149*  BUN 24*  --  26* 26* 24* 41* 46*  CREATININE 1.36*  --  1.18 1.04 1.00 1.15 1.22  AST 121*  --   --   --   --  204*  --   ALT 33  --   --   --   --  116*  --   ALKPHOS 41  --   --   --   --  55  --   BILITOT 1.4*  --   --   --   --  1.6*  --   ALBUMIN 3.8  --   --   --   --  3.4*  --   CRP  --   --   --   --   --   --  14.8*  PROCALCITON  --   --   --   --   --  0.62 0.60  LATICACIDVEN 3.8* 3.4*  --   --   --   --   --   BNP  --   --   --   --   --  2,497.1* 1,650.5*  MG  --   --   --   --  1.9  --  2.3  CALCIUM 9.0  --  8.4* 8.3* 9.1 9.3 9.2      Recent Labs  Lab 10/13/2022 1140 10/02/2022 1415 09/22/2022 2039 10/12/22 0055 10/13/22 0633 10/14/22 0359 10/15/22 0558  CRP  --   --   --   --   --   --  14.8*  PROCALCITON  --   --   --   --   --  0.62 0.60  LATICACIDVEN  3.8* 3.4*  --   --   --   --   --   BNP  --   --   --   --   --  2,497.1* 1,650.5*  MG  --   --   --   --  1.9  --  2.3  CALCIUM 9.0  --  8.4* 8.3* 9.1 9.3 9.2   Lab Results  Component Value Date   HGBA1C 5.5 09/10/2022     Radiology Reports DG Chest Port 1 View  Result Date: 10/15/2022 CLINICAL DATA:  Aspiration pneumonia EXAM: PORTABLE CHEST 1 VIEW COMPARISON:  Yesterday FINDINGS: Severe bilateral airspace disease. Stable heart size which is within normal limits for technique. No gross effusion. No visible pneumothorax. Artifact from EKG leads. IMPRESSION: Unchanged severe airspace disease. Electronically Signed   By: Jorje Guild M.D.   On: 10/15/2022 06:54   DG Chest Port 1 View  Result Date: 10/14/2022 CLINICAL DATA:  Hypoxia.  Aspiration pneumonia. EXAM: PORTABLE CHEST 1 VIEW COMPARISON:  10/10/2022 FINDINGS: Artifact overlies the chest. Cardiomegaly and aortic atherosclerosis as seen previously. Worsening of diffuse pulmonary opacity most consistent with pulmonary edema. Widespread pneumonia is possible. Aspiration is usually more focal or patchy, but is not excluded. There is more focal volume loss in the left lower lobe. IMPRESSION: Worsening of diffuse pulmonary opacity most consistent with pulmonary edema. Widespread pneumonia is possible. Aspiration is usually more focal or patchy, but is not excluded, particularly in the left lower lobe where density is more pronounced. Electronically Signed   By: Nelson Chimes M.D.   On: 10/14/2022 08:43   DG Swallowing Func-Speech Pathology  Result Date: 10/13/2022 Table formatting from the original result was not included. Modified Barium Swallow Study Patient Details Name: TYREE TALATI MRN: [id number] Date of Birth: 08-14-1935 Today's Date: 10/13/2022 HPI/PMH: HPI: 87 yo male was found by family friend at 59 am on 3/23 outside his home after falling.  Last know contact was 5 pm on 3/22.  Noted to have temp 57F, lactic acid 3.8, CPK 5984.  CXR showed bilateral infiltrates Lt > Rt.  Started on ABx, pressors, and IV fluids.  PCCM asked to admit to ICU; CT head on 09/24/2022 indicated Chronic microvascular ischemic change and cerebral volume loss; CXR on 10/12/22 indicated  Mild asymmetric interstitial opacities, left worse than right; ST consulted for BSE with pt exhibiting overt s/sx of aspiration/regurgitation with most consistencies during assessment on 10/12/22.  MBS generated to assess overall swallow function. Clinical Impression: Clinical Impression: Pt exhibited an Mild-moderate oropharyngeal dysphagia c/b slight oral holding with trace residue lining the oral structures and a swallow initiation varying from triggering at the valleculae-pyriform sinuses. Pharyngeal phase of the swallow was moderately delayed with decreased tongue base retraction,reduced pharyngeal stripping wave, partial epiglottic inversion/movement, partial anterior hyoid movement and incomplete laryngeal vestibule closure noted with most consistencies d/t residue remaining in valleculae/pyriform sinuses.  Compensatory strategies utilized included: chin tuck, repetitive dry swallows, liquid wash and effortful swallow which did not remediate imoderate vallecular/pyriform sinus residue.  Pt aspirated thin via cup sip (PAS 4) with cough clearing laryngeal vestibule.  Nectar thick liquids did not result in aspiration when utilized in isolation (PAS 2), but when used as a liquid wash, the nectar thick liquids yielded (PAS 4).  Pt uses "hocking" material up during meals and expels from oral cavity and/or coughs to clear the airway. Esophageal component cannot be ruled out as this was difficult to assess d/t pt positioning.  Due to moderate risk for aspiration, recommend initiating a conservative diet of nectar-thickened liquids ONLY using compensatory swallowing strategies and FULL supervision with meals.  Dysphagia tx recommended to remediate swallow function addressing above deficits in acute setting. Factors that may increase risk of adverse event in presence of aspiration ([place] 2021): Factors that may increase risk of adverse event in presence of aspiration ([place] 2021): Poor general  health and/or compromised immunity; Respiratory or GI disease; Reduced cognitive function; Frail or deconditioned; Dependence for feeding and/or oral hygiene; Frequent aspiration of large volumes Recommendations/Plan: Swallowing Evaluation Recommendations Swallowing Evaluation Recommendations Recommendations: PO diet (nectar-thickened liquids) PO Diet Recommendation: Mildly thick liquids (Level 2, nectar thick) Liquid Administration via: Cup; Spoon; No straw Medication Administration: Other (Comment) (whole with nectar as tolerated) Supervision: Other (comment) Swallowing strategies  : Minimize environmental distractions; Slow rate; Small bites/sips; effortful swallow; Multiple dry swallows after each bite/sip Oral care recommendations: Oral care BID (2x/day); Use suctioning for oral care Recommended consults: Other(comment) (potential for esophageal consult) Treatment Plan Treatment Plan Treatment recommendations: Therapy as outlined in treatment plan below Follow-up recommendations: Other (comment) (TBD) Functional status assessment: Patient has had a recent decline in their functional status and demonstrates the ability to make significant improvements in function in a reasonable and predictable amount of time. Treatment frequency: Min 2x/week Treatment duration: 1 week Interventions: Aspiration precaution training; Compensatory techniques; Patient/family education; Diet toleration management by SLP; Trials of upgraded texture/liquids Recommendations Recommendations for follow up therapy are one component of a multi-disciplinary discharge planning process, led by the attending physician.  Recommendations may be updated based on patient status, additional functional criteria and insurance authorization. Assessment: Orofacial Exam: Orofacial Exam Oral Cavity: Oral Hygiene: Xerostomia Oral Cavity - Dentition: Adequate natural dentition; Missing dentition Orofacial Anatomy: WFL Oral Motor/Sensory Function: WFL  Anatomy: Anatomy: WFL Boluses Administered: No data recorded Oral Impairment Domain: Oral Impairment Domain Lip Closure: No labial escape Tongue control during bolus hold: Cohesive bolus between tongue to palatal seal Bolus preparation/mastication: Slow prolonged chewing/mashing with complete recollection Bolus transport/lingual motion: Brisk tongue motion Oral residue: Trace residue lining oral structures Location of oral residue : Tongue (min) Initiation of pharyngeal swallow : Pyriform sinuses  Pharyngeal Impairment Domain: Pharyngeal Impairment Domain Soft palate elevation: No bolus between soft palate (SP)/pharyngeal wall (PW) Laryngeal elevation: Partial superior movement of thyroid cartilage/partial approximation of arytenoids to epiglottic petiole Anterior hyoid excursion: Partial anterior movement Epiglottic movement: Partial inversion Laryngeal vestibule closure: Incomplete, narrow column air/contrast in laryngeal vestibule Pharyngeal stripping wave : Present - diminished Pharyngeal contraction (A/P view only): N/A Pharyngoesophageal segment opening: Partial distention/partial duration, partial obstruction of flow Tongue base retraction: Trace column of contrast or air between tongue base and PPW Pharyngeal residue: Majority of contrast within or on pharyngeal structures Location of pharyngeal residue: Valleculae; Pyriform sinuses  Esophageal Impairment Domain: No data recorded Pill: No data recorded Penetration/Aspiration Scale Score: No data recorded Compensatory Strategies: Compensatory Strategies Compensatory strategies: Yes Effortful swallow: Ineffective Ineffective Effortful Swallow: Thin liquid (Level 0); Mildly thick liquid (Level 2, nectar thick); Moderately thick liquid (Level 3, honey thick); Puree; Solid Multiple swallows: Effective; Ineffective Effective Multiple Swallows: Thin liquid (Level 0); Mildly thick liquid (Level 2, nectar thick) Ineffective Multiple Swallows: Moderately thick liquid  (Level 3, honey thick); Puree; Solid Chin tuck: Ineffective Ineffective Chin Tuck: Puree Liquid wash: Ineffective Ineffective Liquid Wash: Solid   General Information: Caregiver present: No  Diet Prior to this Study: NPO   Temperature : Normal  Respiratory Status: WFL   Supplemental O2: None (Room air)   History of Recent Intubation: No  Behavior/Cognition: Alert; Cooperative; Distractible; Requires cueing Self-Feeding Abilities: Able to self-feed Baseline vocal quality/speech: Dysphonic Volitional Cough: Able to elicit Volitional Swallow: Able to elicit Exam Limitations: Limited visibility Goal Planning: Prognosis for improved oropharyngeal function: Good Barriers to Reach Goals: Cognitive deficits No data recorded Patient/Family Stated Goal: None reported Consulted and agree with results and recommendations: Nurse; Pt unable/family or caregiver not available Pain: Pain Assessment Pain Assessment: No/denies pain Breathing: 0 Negative Vocalization: 0 Facial Expression: 0 Body Language: 0 Consolability: 0 PAINAD Score: 0 Facial Expression: 0 Body Movements: 0 Muscle Tension: 0 Compliance with ventilator (intubated pts.): N/A Vocalization (extubated pts.): 0 CPOT Total: 0 End of Session: Start Time:SLP Start Time (ACUTE ONLY): 0940 Stop Time: SLP Stop Time (ACUTE ONLY): 0957 Time Calculation:SLP Time Calculation (min) (ACUTE ONLY): 17 min Charges: SLP Evaluations $ SLP Speech Visit: 1 Visit SLP Evaluations $BSS Swallow: 1 Procedure $MBS Swallow: 1 Procedure SLP visit diagnosis: SLP Visit Diagnosis: Dysphagia, oropharyngeal phase (R13.12) Past Medical History: Past Medical History: Diagnosis Date  Arthritis   BPH (benign prostatic hyperplasia)   CAD S/P percutaneous coronary angioplasty 12/2009; 08/2010  a) 6/'22 - NSTEMI: PCI to LAD: Promus Element 2.5 mm x 15 mm DES; b) Cath for Angina: Prox LAD 60-70% pre-stent with FFR 0.82, 90% RVM -- Med Rx, EF 50-55%  Essential hypertension   Hyperlipidemia with target LDL less  than 70   Statin intolerance  Non-ST elevation MI (NSTEMI) [facility]) June 2011  PCI to LAD  Pneumonia   DEC 2016  TX WITH ANTIBIOTIC  Type 2 diabetes mellitus without complications [facility])  Past Surgical History: Past Surgical History: Procedure Laterality Date  CARDIAC CATHETERIZATION  12/2009  Proximal LAD stenosis followed by a significant 80-90% distal stenosis  CARDIAC CATHETERIZATION  February 2012   90% ostial RV marginal branch; 60-70% proximal LAD with widely patent distal stent. FFR 0.82; medical therapy  CATARACT EXTRACTION W/ INTRAOCULAR LENS  IMPLANT, BILATERAL    CORONARY ANGIOPLASTY  12/2009  PTCA to proximal LAD; PCI with Promus Element DES stent  2.5 mm x 15 mm  distalLAD - .for non-ST elevation MI  DENTAL SURGERY    LUMBAR LAMINECTOMY/DECOMPRESSION MICRODISCECTOMY N/A 08/09/2015  Procedure: Lumbar Four-Five, Lumbar Five- Sacral One Decompressive Lumbar Laminectomy with Resection of Synovial Cyst;  Surgeon: Erline Levine, MD;  Location: [place] NEURO ORS;  Service: Neurosurgery;  Laterality: N/A;  L4 to S1 Decompressive Lumbar Laminectomy  LUMBAR LAMINECTOMY/DECOMPRESSION MICRODISCECTOMY Right 01/21/2016  Procedure: Redo Right Lumbar Five-Sacral One Laminectomy for synovial cyst;  Surgeon: Erline Levine, MD;  Location: [place] NEURO ORS;  Service: Neurosurgery;  Laterality: Right;  right  LUMBAR LAMINECTOMY/DECOMPRESSION MICRODISCECTOMY Right 10/08/2017  Procedure: Right Lumbar three-four Laminectomy for synovial cyst;  Surgeon: Erline Levine, MD;  Location: [place];  Service: Neurosurgery;  Laterality: Right;  NASAL FRACTURE SURGERY    TRANSTHORACIC ECHOCARDIOGRAM  June 2011  - (EF not reported) Moderately dilated LV; moderate hypokinesis of anteroseptal and anterior wall consistent with MI. Grade 1 diastolic dysfunction. Mild to moderately dilated LA  VASECTOMY   Pat Adams,M.S., CCC-SLP 10/13/2022, 5:06 PM  Modified Barium Swallow Study Patient Details Name: David [name] MRN: [id number] Date of Birth: July 29, 1935 Today's  Date: 10/13/2022 HPI/PMH: HPI: 87 yo male was found by family friend at 24 am on 3/23 outside his home after falling.  Last know contact was 5 pm on 3/22.  Noted to have temp  67F, lactic acid 3.8, CPK 5984.  CXR showed bilateral infiltrates Lt > Rt.  Started on ABx, pressors, and IV fluids.  PCCM asked to admit to ICU; CT head on 09/30/2022 indicated Chronic microvascular ischemic change and cerebral volume loss; CXR on 10/12/22 indicated Mild asymmetric interstitial opacities, left worse than right; ST consulted for BSE with pt exhibiting overt s/sx of aspiration/regurgitation with most consistencies during assessment on 10/12/22.  MBS generated to assess overall swallow function. Clinical Impression: Clinical Impression: Pt exhibited an Mild-moderate oropharyngeal dysphagia c/b slight oral holding with trace residue lining the oral structures and a swallow initiation varying from triggering at the valleculae-pyriform sinuses. Pharyngeal phase of the swallow was moderately delayed with decreased tongue base retraction,reduced pharyngeal stripping wave, partial epiglottic inversion/movement, partial anterior hyoid movement and incomplete laryngeal vestibule closure noted with most consistencies d/t residue remaining in valleculae/pyriform sinuses.  Compensatory strategies utilized included: chin tuck, repetitive dry swallows, liquid wash and effortful swallow which did not remediate imoderate vallecular/pyriform sinus residue.  Pt aspirated thin via cup sip (PAS 4) with cough clearing laryngeal vestibule.  Nectar thick liquids did not result in aspiration when utilized in isolation (PAS 2), but when used as a liquid wash, the nectar thick liquids yielded (PAS 4).  Pt uses "hocking" material up during meals and expels from oral cavity and/or coughs to clear the airway. Esophageal component cannot be ruled out as this was difficult to assess d/t pt positioning.  Due to moderate risk for aspiration, recommend initiating a  conservative diet of nectar-thickened liquids ONLY using compensatory swallowing strategies and FULL supervision with meals.  Dysphagia tx recommended to remediate swallow function addressing above deficits in acute setting. Factors that may increase risk of adverse event in presence of aspiration ([place] 2021): Factors that may increase risk of adverse event in presence of aspiration ([place] 2021): Poor general health and/or compromised immunity; Respiratory or GI disease; Reduced cognitive function; Frail or deconditioned; Dependence for feeding and/or oral hygiene; Frequent aspiration of large volumes Recommendations/Plan: Swallowing Evaluation Recommendations Swallowing Evaluation Recommendations Recommendations: PO diet (nectar-thickened liquids) PO Diet Recommendation: Mildly thick liquids (Level 2, nectar thick) Liquid Administration via: Cup; Spoon; No straw Medication Administration: Other (Comment) (whole with nectar as tolerated) Supervision: Other (comment) Swallowing strategies  : Minimize environmental distractions; Slow rate; Small bites/sips; effortful swallow; Multiple dry swallows after each bite/sip Oral care recommendations: Oral care BID (2x/day); Use suctioning for oral care Recommended consults: Other(comment) (potential for esophageal consult) Treatment Plan Treatment Plan Treatment recommendations: Therapy as outlined in treatment plan below Follow-up recommendations: Other (comment) (TBD) Functional status assessment: Patient has had a recent decline in their functional status and demonstrates the ability to make significant improvements in function in a reasonable and predictable amount of time. Treatment frequency: Min 2x/week Treatment duration: 1 week Interventions: Aspiration precaution training; Compensatory techniques; Patient/family education; Diet toleration management by SLP; Trials of upgraded texture/liquids Recommendations Recommendations for follow up  therapy are one component of a multi-disciplinary discharge planning process, led by the attending physician.  Recommendations may be updated based on patient status, additional functional criteria and insurance authorization. Assessment: Orofacial Exam: Orofacial Exam Oral Cavity: Oral Hygiene: Xerostomia Oral Cavity - Dentition: Adequate natural dentition; Missing dentition Orofacial Anatomy: WFL Oral Motor/Sensory Function: WFL Anatomy: Anatomy: WFL Boluses Administered: No data recorded Oral Impairment Domain: Oral Impairment Domain Lip Closure: No labial escape Tongue control during bolus hold: Cohesive bolus between tongue to palatal seal Bolus preparation/mastication: Slow prolonged chewing/mashing  with complete recollection Bolus transport/lingual motion: Brisk tongue motion Oral residue: Trace residue lining oral structures Location of oral residue : Tongue (min) Initiation of pharyngeal swallow : Pyriform sinuses  Pharyngeal Impairment Domain: Pharyngeal Impairment Domain Soft palate elevation: No bolus between soft palate (SP)/pharyngeal wall (PW) Laryngeal elevation: Partial superior movement of thyroid cartilage/partial approximation of arytenoids to epiglottic petiole Anterior hyoid excursion: Partial anterior movement Epiglottic movement: Partial inversion Laryngeal vestibule closure: Incomplete, narrow column air/contrast in laryngeal vestibule Pharyngeal stripping wave : Present - diminished Pharyngeal contraction (A/P view only): N/A Pharyngoesophageal segment opening: Partial distention/partial duration, partial obstruction of flow Tongue base retraction: Trace column of contrast or air between tongue base and PPW Pharyngeal residue: Majority of contrast within or on pharyngeal structures Location of pharyngeal residue: Valleculae; Pyriform sinuses  Esophageal Impairment Domain: No data recorded Pill: No data recorded Penetration/Aspiration Scale Score: No data recorded Compensatory Strategies:  Compensatory Strategies Compensatory strategies: Yes Effortful swallow: Ineffective Ineffective Effortful Swallow: Thin liquid (Level 0); Mildly thick liquid (Level 2, nectar thick); Moderately thick liquid (Level 3, honey thick); Puree; Solid Multiple swallows: Effective; Ineffective Effective Multiple Swallows: Thin liquid (Level 0); Mildly thick liquid (Level 2, nectar thick) Ineffective Multiple Swallows: Moderately thick liquid (Level 3, honey thick); Puree; Solid Chin tuck: Ineffective Ineffective Chin Tuck: Puree Liquid wash: Ineffective Ineffective Liquid Wash: Solid   General Information: Caregiver present: No  Diet Prior to this Study: NPO   Temperature : Normal   Respiratory Status: WFL   Supplemental O2: None (Room air)   History of Recent Intubation: No  Behavior/Cognition: Alert; Cooperative; Distractible; Requires cueing Self-Feeding Abilities: Able to self-feed Baseline vocal quality/speech: Dysphonic Volitional Cough: Able to elicit Volitional Swallow: Able to elicit Exam Limitations: Limited visibility Goal Planning: Prognosis for improved oropharyngeal function: Good Barriers to Reach Goals: Cognitive deficits No data recorded Patient/Family Stated Goal: None reported Consulted and agree with results and recommendations: Nurse; Pt unable/family or caregiver not available Pain: Pain Assessment Pain Assessment: No/denies pain Breathing: 0 Negative Vocalization: 0 Facial Expression: 0 Body Language: 0 Consolability: 0 PAINAD Score: 0 Facial Expression: 0 Body Movements: 0 Muscle Tension: 0 Compliance with ventilator (intubated pts.): N/A Vocalization (extubated pts.): 0 CPOT Total: 0 End of Session: Start Time:SLP Start Time (ACUTE ONLY): 0940 Stop Time: SLP Stop Time (ACUTE ONLY): 0957 Time Calculation:SLP Time Calculation (min) (ACUTE ONLY): 17 min Charges: SLP Evaluations $ SLP Speech Visit: 1 Visit SLP Evaluations $BSS Swallow: 1 Procedure $MBS Swallow: 1 Procedure SLP visit diagnosis: SLP Visit  Diagnosis: Dysphagia, oropharyngeal phase (R13.12) Past Medical History: Past Medical History: Diagnosis Date  Arthritis   BPH (benign prostatic hyperplasia)   CAD S/P percutaneous coronary angioplasty 12/2009; 08/2010  a) 6/'22 - NSTEMI: PCI to LAD: Promus Element 2.5 mm x 15 mm DES; b) Cath for Angina: Prox LAD 60-70% pre-stent with FFR 0.82, 90% RVM -- Med Rx, EF 50-55%  Essential hypertension   Hyperlipidemia with target LDL less than 70   Statin intolerance  Non-ST elevation MI (NSTEMI) [facility]) June 2011  PCI to LAD  Pneumonia   DEC 2016  TX WITH ANTIBIOTIC  Type 2 diabetes mellitus without complications [facility])  Past Surgical History: Past Surgical History: Procedure Laterality Date  CARDIAC CATHETERIZATION  12/2009  Proximal LAD stenosis followed by a significant 80-90% distal stenosis  CARDIAC CATHETERIZATION  February 2012   90% ostial RV marginal branch; 60-70% proximal LAD with widely patent distal stent. FFR 0.82; medical therapy  CATARACT EXTRACTION W/ INTRAOCULAR LENS  IMPLANT, BILATERAL    CORONARY ANGIOPLASTY  12/2009  PTCA to proximal LAD; PCI with Promus Element DES stent  2.5 mm x 15 mm  distalLAD - .for non-ST elevation MI  DENTAL SURGERY    LUMBAR LAMINECTOMY/DECOMPRESSION MICRODISCECTOMY N/A 08/09/2015  Procedure: Lumbar Four-Five, Lumbar Five- Sacral One Decompressive Lumbar Laminectomy with Resection of Synovial Cyst;  Surgeon: Erline Levine, MD;  Location: [place] NEURO ORS;  Service: Neurosurgery;  Laterality: N/A;  L4 to S1 Decompressive Lumbar Laminectomy  LUMBAR LAMINECTOMY/DECOMPRESSION MICRODISCECTOMY Right 01/21/2016  Procedure: Redo Right Lumbar Five-Sacral One Laminectomy for synovial cyst;  Surgeon: Erline Levine, MD;  Location: [place] NEURO ORS;  Service: Neurosurgery;  Laterality: Right;  right  LUMBAR LAMINECTOMY/DECOMPRESSION MICRODISCECTOMY Right 10/08/2017  Procedure: Right Lumbar three-four Laminectomy for synovial cyst;  Surgeon: Erline Levine, MD;  Location: [place];  Service: Neurosurgery;   Laterality: Right;  NASAL FRACTURE SURGERY    TRANSTHORACIC ECHOCARDIOGRAM  June 2011  - (EF not reported) Moderately dilated LV; moderate hypokinesis of anteroseptal and anterior wall consistent with MI. Grade 1 diastolic dysfunction. Mild to moderately dilated LA  VASECTOMY   Pat Adams,M.S., CCC-SLP 10/13/2022, 5:17 PM Assessment / Plan / Recommendation Failed to redirect to the Timeline version of the REVFS SmartLink.   Failed to redirect to the Timeline version of the REVFS SmartLink.   Failed to redirect to the Timeline version of the REVFS SmartLink. Failed to redirect to the Timeline version of the REVFS SmartLink.    Pat Adams,M.S., CCC-SLP 10/13/2022, 5:05 PM                     DG Knee Complete 4 Views Left  Result Date: 09/28/2022 CLINICAL DATA:  fall EXAM: LEFT KNEE - COMPLETE 4+ VIEW COMPARISON:  None Available. FINDINGS: No fracture or dislocation. Small effusion in the suprapatellar bursa. Marginal spurs from the patellar articular surface, tibial plateau, and medial femoral condyle. There is marked narrowing of articular cartilage in the medial compartment. IMPRESSION: 1. 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 2. Small effusion. Electronically Signed   By: Lucrezia Europe M.D.   On: 09/23/2022 14:06   DG Hip Unilat With Pelvis 2-3 Views Left  Result Date: 10/14/2022 CLINICAL DATA:  fall EXAM: DG HIP (WITH OR WITHOUT PELVIS) 2-3V LEFT COMPARISON:  07/06/2022 FINDINGS: No fracture or dislocation. Bilateral hip DJD. Spondylitic changes in the visualized lower lumbar spine. Bilateral pelvic phleboliths. IMPRESSION: No acute findings. Bilateral hip DJD. Electronically Signed   By: Lucrezia Europe M.D.   On: 10/10/2022 14:03   DG Chest Port 1 View  Result Date: 09/23/2022 CLINICAL DATA:  Fall EXAM: PORTABLE CHEST - 1 VIEW COMPARISON:  07/06/2022 FINDINGS: Mild interstitial opacities throughout the left lung, and to a milder degree on the right in a perihilar distribution. Heart size  normal.  Aortic Atherosclerosis (ICD10-170.0). Mild elevation of left diaphragmatic leaflet. No definite effusion. No evident pneumothorax. Visualized bones unremarkable. IMPRESSION: Mild asymmetric interstitial opacities, left worse than right. Electronically Signed   By: Lucrezia Europe M.D.   On: 09/22/2022 14:02   CT Cervical Spine Wo Contrast  Result Date: 10/08/2022 CLINICAL DATA:  Neck trauma. EXAM: CT CERVICAL SPINE WITHOUT CONTRAST TECHNIQUE: Multidetector CT imaging of the cervical spine was performed without intravenous contrast. Multiplanar CT image reconstructions were also generated. RADIATION DOSE REDUCTION: This exam was performed according to the departmental dose-optimization program which includes automated exposure control, adjustment of the mA and/or kV according to patient size and/or use of  iterative reconstruction technique. COMPARISON:  07/06/2022. FINDINGS: Alignment: Normal. Skull base and vertebrae: No acute fracture. No primary bone lesion or focal pathologic process. Soft tissues and spinal canal: No prevertebral fluid or swelling. No visible canal hematoma. Disc levels: Disc space narrowing with marginal osteophyte formation identified throughout the cervical spine and upper thoracic spine consistent with degenerative disc disease. Osteoarthritis identified at C1-C2. Upper chest: Alveolar consolidation or volume loss left upper lobe. IMPRESSION: Degenerative changes. No acute traumatic abnormalities. Left upper lung alveolar density. Electronically Signed   By: Sammie Bench M.D.   On: 10/01/2022 13:19   CT Head Wo Contrast  Result Date: 10/09/2022 CLINICAL DATA:  Unwitnessed fall EXAM: CT HEAD WITHOUT CONTRAST TECHNIQUE: Contiguous axial images were obtained from the base of the skull through the vertex without intravenous contrast. RADIATION DOSE REDUCTION: This exam was performed according to the departmental dose-optimization program which includes automated exposure control,  adjustment of the mA and/or kV according to patient size and/or use of iterative reconstruction technique. COMPARISON:  07/06/2022 FINDINGS: Brain: No evidence of acute infarction, hemorrhage, hydrocephalus, extra-axial collection or mass lesion/mass effect. Scattered low-density changes within the periventricular and subcortical white matter most compatible with chronic microvascular ischemic change. Mild-moderate diffuse cerebral volume loss. Vascular: Atherosclerotic calcifications involving the large vessels of the skull base. No unexpected hyperdense vessel. Skull: Normal. Negative for fracture or focal lesion. Sinuses/Orbits: Chronic mild right-sided paranasal sinus disease. Mastoid air cells are clear. Other: None. IMPRESSION: 1. No acute intracranial findings. 2. Chronic microvascular ischemic change and cerebral volume loss. Electronically Signed   By: Davina Poke D.O.   On: 10/07/2022 13:14      Signature  -   Lala Lund M.D on 10/15/2022 at 9:40 AM   -  To page go to [email or web address]

## 2022-10-20 NOTE — Progress Notes (Signed)
Daily Progress Note   Patient Name: CASIMIER DUMITRESCU       Date: 11/02/2022 DOB: [birth year]  Age: 87 y.o. MRN#: [id number] Attending Physician: No att. providers found Primary Care Physician: [name], MD Admit Date: 10/01/2022  Reason for Consultation/Follow-up: Establishing goals of care  Subjective: unresponsive  Length of Stay: 5  Current Medications: Scheduled Meds:   nicotine  21 mg Transdermal Daily    Continuous Infusions:  sodium chloride     HYDROmorphone Stopped (2022/11/02 1134)    PRN Meds: acetaminophen **OR** acetaminophen, antiseptic oral rinse, glycopyrrolate **OR** glycopyrrolate **OR** glycopyrrolate, haloperidol **OR** haloperidol **OR** haloperidol lactate, HYDROmorphone, HYDROmorphone (DILAUDID) injection, ondansetron **OR** ondansetron (ZOFRAN) IV, polyvinyl alcohol  Physical Exam Constitutional:      Comments: Unresponsive  Cardiovascular:     Rate and Rhythm: Tachycardia present.  Pulmonary:     Comments: tachypneic Skin:    General: Skin is warm and dry.             Vital Signs: BP (!) 133/91 (BP Location: Right Arm)   Pulse (!) 112   Temp 97.6 F (36.4 C) (Axillary)   Resp 11   Ht 5\' 10"  (1.778 m)   Wt 78 kg   SpO2 90%   BMI 24.67 kg/m  SpO2: SpO2: 90 % O2 Device: O2 Device: Nasal Cannula O2 Flow Rate: O2 Flow Rate (L/min): 2 L/min  Intake/output summary:  Intake/Output Summary (Last 24 hours) at 11-02-2022 1432 Last data filed at 11-02-22 1300 Gross per 24 hour  Intake --  Output 810 ml  Net -810 ml   LBM: Last BM Date :  (PTA) Baseline Weight: Weight: 75.8 kg Most recent weight: Weight: 78 kg       Palliative Assessment/Data: PPS 10%      Patient Active Problem List   Diagnosis Date Noted   Aspiration pneumonitis (HCC)  09/23/2022   LBBB (left bundle branch block) 02/10/2022   Dilated cardiomyopathy ([place]) 02/10/2022   Current severe episode of major depressive disorder without psychotic features ([place]) 06/25/2021   Major neurocognitive disorder ([place]) 06/25/2021   Myalgia due to statin 08/30/2020   Venous stasis dermatitis of both lower extremities 08/30/2020   B12 deficiency 04/13/2020   Memory changes 04/12/2020   Retinopathy, diabetic, bilateral ([place]) 02/01/2018   Aortic atherosclerosis ([place]) by CT scan 2017 01/08/2017   Low back pain 06/12/2015   Synovial cyst 06/06/2015   Spondylolisthesis 06/06/2015   BPH (benign prostatic hyperplasia) 11/16/2014   Vitamin D deficiency 12/25/2013   Medication management 12/25/2013   Overweight (BMI 25.0-29.9) 02/23/2013   Tobacco dependency 02/22/2013   Hyperlipidemia associated with type 2 diabetes mellitus ([place])    Essential hypertension    Type II diabetes mellitus with nephropathy (HCC)    CAD S/P percutaneous coronary angioplasty and PCI- DES mid LAD in the setting of non-STEMI 12/31/2009    Palliative Care Assessment & Plan   HPI: 87 y.o. male  with past medical history of arthritis, BPH, CAD, HTN, HLD, DM type 2, PNA, Dementia who presented to the emergency department after he was found in the yard by family friend after falling off the front porch.  He was admitted on 10/10/2022 with acute  hypoxic respiratory failure from aspiration pneumonitis, hypothermia from environmental exposure, shock from sepsis and hypovolemia, AKI from sepsis and hypovolemia, rhabdomyolysis, history of dementia, dysphagia, and others.    PMT was consulted for [place] conversations.  Assessment: Patient was transition to comfort care yesterday. Follow-up today -son at bedside.  Son reports no questions or concerns. Upon my exam patient appears quite tachypneic.  Requested nurse administer dose of medication to relieve tachypnea.  Followed up after medication administered and  patient remained tachypneic.  Discussed with stun starting Dilaudid infusion to maintain medication in system and ease symptoms.  Son agreeable.  Continue comfort measures only.  Recommendations/Plan: Comfort measures only At Dilaudid infusion as tachypnea persists despite as needed medication administered Anticipate hospital death  Goals of Care and Additional Recommendations: Limitations on Scope of Treatment: Full Comfort Care  Code Status: DNR  Prognosis:  Hours - Days  Discharge Planning: Anticipated Hospital Death  Care plan was discussed with patient's son, RN  Thank you for allowing the Palliative Medicine Team to assist in the care of this patient.   *Please note that this is a verbal dictation therefore any spelling or grammatical errors are due to the "[place] [employer]" system interpretation.  Juel Burrow, DNP, [facility] Palliative Medicine Team Team Phone # [phone]  Pager [phone]

## 2022-10-20 DEATH — deceased

## 2022-10-22 ENCOUNTER — Ambulatory Visit: Payer: Medicare Other | Admitting: Nurse Practitioner

## 2022-12-09 ENCOUNTER — Ambulatory Visit: Payer: Medicare Other | Admitting: Nurse Practitioner

## 2022-12-24 ENCOUNTER — Ambulatory Visit: Payer: Medicare Other | Admitting: Nurse Practitioner

## 2023-03-09 ENCOUNTER — Encounter: Payer: Medicare Other | Admitting: Internal Medicine

## 2023-03-10 ENCOUNTER — Encounter: Payer: Medicare Other | Admitting: Internal Medicine

## 2023-03-11 ENCOUNTER — Encounter: Payer: Medicare Other | Admitting: Internal Medicine

## 2023-03-12 ENCOUNTER — Encounter: Payer: Medicare Other | Admitting: Internal Medicine
